# Patient Record
Sex: Male | Born: 1960 | Race: White | Hispanic: No | Smoking: Former smoker
Health system: Southern US, Community
[De-identification: ages and names within clinical notes are randomized; demographics above are authoritative.]

## PROBLEM LIST (undated history)

## (undated) DIAGNOSIS — K219 Gastro-esophageal reflux disease without esophagitis: Secondary | ICD-10-CM

## (undated) DIAGNOSIS — K766 Portal hypertension: Secondary | ICD-10-CM

## (undated) DIAGNOSIS — I8500 Esophageal varices without bleeding: Secondary | ICD-10-CM

## (undated) DIAGNOSIS — T7840XA Allergy, unspecified, initial encounter: Secondary | ICD-10-CM

## (undated) DIAGNOSIS — G5600 Carpal tunnel syndrome, unspecified upper limb: Secondary | ICD-10-CM

## (undated) DIAGNOSIS — E785 Hyperlipidemia, unspecified: Secondary | ICD-10-CM

## (undated) DIAGNOSIS — I10 Essential (primary) hypertension: Secondary | ICD-10-CM

## (undated) DIAGNOSIS — K922 Gastrointestinal hemorrhage, unspecified: Secondary | ICD-10-CM

## (undated) DIAGNOSIS — E119 Type 2 diabetes mellitus without complications: Secondary | ICD-10-CM

## (undated) DIAGNOSIS — M1990 Unspecified osteoarthritis, unspecified site: Secondary | ICD-10-CM

## (undated) DIAGNOSIS — K3189 Other diseases of stomach and duodenum: Secondary | ICD-10-CM

## (undated) DIAGNOSIS — R7303 Prediabetes: Secondary | ICD-10-CM

## (undated) HISTORY — DX: Gastro-esophageal reflux disease without esophagitis: K21.9

## (undated) HISTORY — DX: Carpal tunnel syndrome, unspecified upper limb: G56.00

## (undated) HISTORY — DX: Essential (primary) hypertension: I10

## (undated) HISTORY — DX: Unspecified osteoarthritis, unspecified site: M19.90

## (undated) HISTORY — PX: SPINE SURGERY: SHX786

## (undated) HISTORY — PX: OTHER SURGICAL HISTORY: SHX169

## (undated) HISTORY — DX: Hyperlipidemia, unspecified: E78.5

## (undated) HISTORY — DX: Other diseases of stomach and duodenum: K31.89

## (undated) HISTORY — DX: Allergy, unspecified, initial encounter: T78.40XA

## (undated) HISTORY — DX: Gastrointestinal hemorrhage, unspecified: K92.2

## (undated) HISTORY — PX: CARPAL TUNNEL RELEASE: SHX101

## (undated) HISTORY — DX: Other diseases of stomach and duodenum: K76.6

## (undated) HISTORY — DX: Esophageal varices without bleeding: I85.00

## (undated) HISTORY — PX: CERVICAL FUSION: SHX112

## (undated) SURGERY — ESOPHAGOGASTRODUODENOSCOPY (EGD) WITH PROPOFOL
Anesthesia: Monitor Anesthesia Care

---

## 2006-03-25 ENCOUNTER — Emergency Department (HOSPITAL_COMMUNITY): Admission: EM | Admit: 2006-03-25 | Discharge: 2006-03-25 | Payer: Self-pay | Admitting: Family Medicine

## 2006-10-06 ENCOUNTER — Inpatient Hospital Stay (HOSPITAL_COMMUNITY): Admission: EM | Admit: 2006-10-06 | Discharge: 2006-10-06 | Payer: Self-pay | Admitting: Emergency Medicine

## 2006-10-06 ENCOUNTER — Encounter (INDEPENDENT_AMBULATORY_CARE_PROVIDER_SITE_OTHER): Payer: Self-pay | Admitting: Specialist

## 2009-02-19 ENCOUNTER — Encounter: Admission: RE | Admit: 2009-02-19 | Discharge: 2009-02-19 | Payer: Self-pay | Admitting: Internal Medicine

## 2010-12-16 NOTE — Op Note (Signed)
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  Operation was anal  ultrasound and fistulectomy x2.   SURGEON:  Dr. Consuello Bossier.   HISTORY:  Nathan Roberts is a 50 year old Caucasian male whom I first  saw yesterday with the following history.  He said for several months he  has had recurring boils and infection around his anus.  Sometimes he  will actually notice that he actually passes a little gas through these  little external openings.  It has been more painful than it was when I  saw him yesterday but on exam, he had 2-3 external fistulae openings,  all kind of the anterior to the anus.  Some of the openings were 5-6 cm  away from the actual anal verge.  I could feel a definitely thickened  fluctuant area that was not significant erythema and fluctuance like an  obvious perirectal abscess, and attempts to do any type anoscopic exam  and identify an internal fistula opening, I could not find anything that  was definitely an internal fistula opening.  I recommended that we do 2  things.  One, he needs to be examined under anesthesia with anal  ultrasound and excision of these, and I have discussed with him that  with 2 areas, the possibility of Crohn's disease.  He says he has a  little bowel irregularity but nothing that significant, and he has not  had any weight loss, and he does not have any significant family history  of inflammatory bowel disease.  We discussed the options of possibly  doing the surgery next Wednesday or today, and he elected to go ahead  and proceed today.  I had placed him on Augmentin yesterday.   This  morning, he had a bowel movement, and he came in.  His lab studies did  not show an elevated white count, and permission was obtained for the  procedure.  The patient was taken to the operative suite, general  anesthesia endotracheal tube, and we gave him 3 g of Unasyn with  starting the IV fluids.  The patient in the candy-cane stirrups first  used the anal ultrasound and did the examination.  I could not see an  actual abscess.  I then put hydrogen peroxide starting on the area on  the left first and with this, and you could actually see the peroxide  coming out at the dentate line area, and it looks like this is not a  high fistula but one that is actually going nearly superficial to the  internal and external sphincter.  On injection on the opposite side, I  could not see any actual peroxide actually coming forth at that time and  whether the 2 communicated I could not tell at  this point of the  procedure.  I then prepped him with Betadine solution, draped him, and  then used a probe that I directed, starting along the area on the left  first and kind of curves a little bit towards the midline and then kind  of goes to the sphincter area, and I kind of opened up the overlying  skin on top of the probe and then with the probe in this, I could inject  some more peroxide, identified the little internal fistula open, and  then could slip the probe through it.  I then basically did a  fistulotomy, but I went back and then excised this chronic tract that  was about the size of kind of a thickened straw.  It appears that it  does not go down deep to the internal sphincter but actually kind of  goes through just the more superficial sphincter fibers.  Good  hemostasis was obtained with cautery, and I could not identify frank  large perirectal abscess at this time.  I then directed my attention to  the opposite side.  On this side, there were actually 2 external  openings that  communicated, and I used the peroxide and then placed a  probe, divided on the area, and then with now we were down probably 3 cm  from the anal verge, reinjected a little peroxide and this time I could  see a little fistula opening and each of these so that the openings if  you are looking at the anus; one is about 10:30 and the other was about  1:30 so that they do not communicate but following Gouda's rule, kind of  going directly down to the internal and external sphincter complex area.  I then could slide the little probe through this and actually did first  a fistulotomy and then actually excised the little fistulous fibrous  tract and sent the specimens labeled as left and right fistulous tract.  Hemostasis was again controlled.  I did remove a little bit more skin to  connect it to external openings and then to kind of approximate the skin  edges and kind of hopefully heal and speed the healing.  I did use some  simple 3-0 nylon sutures to kind of approximate the skin edges, left the  area open for the oh inch or so closest and kind of going up into the  sphincter and then used 1/2 inch Iodoform gauze, kind to tripled back-  and-forth, kind of in the little cavity for predominantly hemostasis  today.  The surgery was terminated and with the patient awakened up, I  think he is actually going to still have good sphincter control as the  fistulas appeared to go superficial to the internal, external sphincter  and not really tipping up under the sphincter as is usually more common.  The patient will receive an additional dose of Unasyn and then will be  released later today to resume his Augmentin, and I will see him in the  office on Monday to remove the packing.  He will start soaking in the  tub tomorrow and hopefully will be able to return to work.  He works a  split week.  He will not be able to go Monday and Tuesday, but I would hope by the following time frame that he works, he  will be able to be  back to work.           ______________________________  Nathan Pancoast.  Nathan Roberts, M.D.     WJW/MEDQ  D:  10/06/2006  T:  10/06/2006  Job:  [id number]

## 2011-07-07 ENCOUNTER — Ambulatory Visit (INDEPENDENT_AMBULATORY_CARE_PROVIDER_SITE_OTHER): Payer: PRIVATE HEALTH INSURANCE

## 2011-07-07 DIAGNOSIS — Z0000 Encounter for general adult medical examination without abnormal findings: Secondary | ICD-10-CM

## 2011-07-07 DIAGNOSIS — H9319 Tinnitus, unspecified ear: Secondary | ICD-10-CM

## 2011-07-07 DIAGNOSIS — IMO0001 Reserved for inherently not codable concepts without codable children: Secondary | ICD-10-CM

## 2011-07-29 ENCOUNTER — Ambulatory Visit (INDEPENDENT_AMBULATORY_CARE_PROVIDER_SITE_OTHER): Payer: PRIVATE HEALTH INSURANCE

## 2011-07-29 DIAGNOSIS — M545 Low back pain, unspecified: Secondary | ICD-10-CM

## 2011-07-29 DIAGNOSIS — M62838 Other muscle spasm: Secondary | ICD-10-CM

## 2011-11-01 ENCOUNTER — Encounter: Payer: Self-pay | Admitting: Family Medicine

## 2011-11-01 ENCOUNTER — Ambulatory Visit (INDEPENDENT_AMBULATORY_CARE_PROVIDER_SITE_OTHER): Payer: Managed Care, Other (non HMO) | Admitting: Family Medicine

## 2011-11-01 VITALS — BP 150/100 | Temp 98.4°F | Ht 68.5 in | Wt 206.0 lb

## 2011-11-01 DIAGNOSIS — Z23 Encounter for immunization: Secondary | ICD-10-CM

## 2011-11-01 DIAGNOSIS — M5090 Cervical disc disorder, unspecified, unspecified cervical region: Secondary | ICD-10-CM

## 2011-11-01 DIAGNOSIS — Z0000 Encounter for general adult medical examination without abnormal findings: Secondary | ICD-10-CM

## 2011-11-01 DIAGNOSIS — E785 Hyperlipidemia, unspecified: Secondary | ICD-10-CM

## 2011-11-01 DIAGNOSIS — I10 Essential (primary) hypertension: Secondary | ICD-10-CM

## 2011-11-01 DIAGNOSIS — N529 Male erectile dysfunction, unspecified: Secondary | ICD-10-CM

## 2011-11-01 LAB — CBC WITH DIFFERENTIAL/PLATELET
Basophils Relative: 0.4 % (ref 0.0–3.0)
Eosinophils Absolute: 0.2 10*3/uL (ref 0.0–0.7)
Hemoglobin: 15.7 g/dL (ref 13.0–17.0)
Lymphs Abs: 1.5 10*3/uL (ref 0.7–4.0)
MCHC: 34.1 g/dL (ref 30.0–36.0)
MCV: 98.1 fl (ref 78.0–100.0)
Monocytes Absolute: 0.8 10*3/uL (ref 0.1–1.0)
Neutro Abs: 3.3 10*3/uL (ref 1.4–7.7)
RBC: 4.69 Mil/uL (ref 4.22–5.81)

## 2011-11-01 LAB — BASIC METABOLIC PANEL
CO2: 28 mEq/L (ref 19–32)
Calcium: 9.3 mg/dL (ref 8.4–10.5)
Creatinine, Ser: 0.8 mg/dL (ref 0.4–1.5)
Glucose, Bld: 103 mg/dL — ABNORMAL HIGH (ref 70–99)
Sodium: 141 mEq/L (ref 135–145)

## 2011-11-01 LAB — POCT URINALYSIS DIPSTICK
Spec Grav, UA: 1.02
Urobilinogen, UA: 2
pH, UA: 7

## 2011-11-01 LAB — LIPID PANEL
Cholesterol: 142 mg/dL (ref 0–200)
LDL Cholesterol: 68 mg/dL (ref 0–99)
Total CHOL/HDL Ratio: 3

## 2011-11-01 LAB — HEPATIC FUNCTION PANEL
Alkaline Phosphatase: 108 U/L (ref 39–117)
Bilirubin, Direct: 0.2 mg/dL (ref 0.0–0.3)

## 2011-11-01 MED ORDER — SILDENAFIL CITRATE 50 MG PO TABS: 50.0000 mg | ORAL_TABLET | ORAL | Status: DC | PRN

## 2011-11-01 MED ORDER — ATORVASTATIN CALCIUM 40 MG PO TABS: 40.0000 mg | ORAL_TABLET | Freq: Every day | ORAL | Status: DC

## 2011-11-01 MED ORDER — TRAMADOL HCL 50 MG PO TABS: 50.0000 mg | ORAL_TABLET | Freq: Three times a day (TID) | ORAL | Status: AC | PRN

## 2011-11-01 MED ORDER — AMLODIPINE BESYLATE 10 MG PO TABS: 10.0000 mg | ORAL_TABLET | Freq: Every day | ORAL | Status: DC

## 2011-11-01 NOTE — Progress Notes (Signed)
Subjective:    Patient ID: Nathan Roberts, male    DOB: Dec 09, 1960, 51 y.o.   MRN: [id number]  HPI Nathan Roberts is a 51 year old married male nonsmoker,,,,,,,,, Nathan Roberts of 17 years is a smoker in the house,,,,,,,,,, who comes in today for is a new patient for general physical examination because of a history of hypertension  He has been on a combination of Norvasc and atorvastatin 5-40 for a combination of hypertension and hyperlipidemia. BP today right arm sitting position 180/100 he states the lowest at home is 140/90  He had a cervical fusion C4-C5 in [place] many years ago and because of that he has chronic neck pain. 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 He does get a massage weekly. In the past he was given Vicodin. He's also had left carpal tunnel syndrome surgery  He's had allergic rhinitis  Again he is a nonsmoker but is exposed to secondhand smoke.  Review of systems pertinent and had Lasix eye surgery.... referred attendee this dry exam, regular dental care, never had a colonoscopy, is latex sensitive  Family history well outlined father had hypertension congestive heart failure obesity and atrial fibrillation mother died of lung cancer was a smoker 3 brothers 2 sisters all in good health tetanus booster unknown will give him a booster today.   Review of Systems  Constitutional: Negative.   HENT: Negative.   Eyes: Negative.   Respiratory: Negative.   Cardiovascular: Negative.   Gastrointestinal: Negative.   Genitourinary: Negative.   Musculoskeletal: Positive for arthralgias.  Skin: Negative.   Neurological: Negative.   Hematological: Negative.   Psychiatric/Behavioral: Negative.        Objective:   Physical Exam  Constitutional: He is oriented to person, place, and time. He appears well-developed and well-nourished.  HENT:  Head: Normocephalic and atraumatic.  Right Ear: External ear normal.  Left Ear: External ear normal.    Nose: Nose normal.  Mouth/Throat: Oropharynx is clear and moist.  Eyes: Conjunctivae and EOM are normal. Pupils are equal, round, and reactive to light.  Neck: Normal range of motion. Neck supple. No JVD present. No tracheal deviation present. No thyromegaly present.  Cardiovascular: Normal rate, regular rhythm, normal heart sounds and intact distal pulses.  Exam reveals no gallop and no friction rub.   No murmur heard. Pulmonary/Chest: Effort normal and breath sounds normal. No stridor. No respiratory distress. He has no wheezes. He has no rales. He exhibits no tenderness.  Abdominal: Soft. Bowel sounds are normal. He exhibits no distension and no mass. There is no tenderness. There is no rebound and no guarding.  Genitourinary: Rectum normal, prostate normal and penis normal. Guaiac negative stool. No penile tenderness.  Musculoskeletal: Normal range of motion. He exhibits no edema and no tenderness.  Lymphadenopathy:    He has no cervical adenopathy.  Neurological: He is alert and oriented to person, place, and time. He has normal reflexes. No cranial nerve deficit. He exhibits normal muscle tone.  Skin: Skin is warm and dry. No rash noted. No erythema. No pallor.  Psychiatric: He has a normal mood and affect. Nathan behavior is normal. Judgment and thought content normal.          Assessment & Plan:  Healthy male  Hypertension not at goal increase Norvasc to 10 mg daily BP check daily followup in 2 weeks  History of cervical disc surgery now with chronic neck and back pain Motrin 400 twice a  day tramadol one half to one tablet at bedtime neurosurgical consult if symptoms persist  Exposed to secondhand smoke for 20 years advised to have Nathan Roberts not smoke in the house and they'll be happy to see her and put her on a smoking cessation program  Allergic rhinitis continue OTC Claritin  Needs screening colonoscopy we'll set that up

## 2011-11-01 NOTE — Patient Instructions (Signed)
Motrin 400 mg twice daily with food  Tramadol one half to one tablet at bedtime as needed for pain  If the above does not control your symptoms then we will get you set up for a evaluation by a neurosurgeon  Viagra 50 mg,,,,,,,,,,,, one half tab 1-2 hours prior to sex  Norvasc 10 mg daily for high blood pressure  Purchase an  upper arm digital pump oup  blood pressure cuff,,,,,,,,,,,,,,,,,omron,,,,,,,,,,,, check your blood pressure daily in the morning return in 2 weeks for followup  Complete no salt diet  Lipitor 40 mg,,,,,,,,,,,,,,one tablet at bedtime for hyperlipidemia  When you return in 2 weeks bring a record of all your blood pressure readings and the device  Because of your hypertension I would recommend you stay in a complete a smoke free environment,,,,,,, asked u  wife not to smoke in the house anymore

## 2011-12-12 ENCOUNTER — Encounter: Payer: Self-pay | Admitting: Gastroenterology

## 2011-12-15 ENCOUNTER — Telehealth: Payer: Self-pay | Admitting: Family Medicine

## 2011-12-15 DIAGNOSIS — M5090 Cervical disc disorder, unspecified, unspecified cervical region: Secondary | ICD-10-CM

## 2011-12-15 NOTE — Telephone Encounter (Signed)
Referral request sent 

## 2011-12-15 NOTE — Telephone Encounter (Signed)
Pt states he was seen a few weeks ago and was instructed by Dr. Tawanna Cooler to call back if he was not any better.  Pt states he is not better and would like to be referred to a neurologist per Dr. Nelida Meuse advice.

## 2011-12-20 ENCOUNTER — Other Ambulatory Visit: Payer: Self-pay | Admitting: Specialist

## 2011-12-20 DIAGNOSIS — R51 Headache: Secondary | ICD-10-CM

## 2011-12-22 ENCOUNTER — Ambulatory Visit (AMBULATORY_SURGERY_CENTER): Payer: Managed Care, Other (non HMO) | Admitting: *Deleted

## 2011-12-22 VITALS — Ht 68.5 in | Wt 206.2 lb

## 2011-12-22 DIAGNOSIS — Z1211 Encounter for screening for malignant neoplasm of colon: Secondary | ICD-10-CM

## 2011-12-22 MED ORDER — PEG-KCL-NACL-NASULF-NA ASC-C 100 G PO SOLR: ORAL | Status: DC

## 2011-12-27 ENCOUNTER — Ambulatory Visit
Admission: RE | Admit: 2011-12-27 | Discharge: 2011-12-27 | Disposition: A | Discharge status: Home / Self Care | Payer: Managed Care, Other (non HMO) | Source: Ambulatory Visit | Attending: Specialist | Admitting: Specialist

## 2011-12-27 DIAGNOSIS — R51 Headache: Secondary | ICD-10-CM

## 2012-01-01 ENCOUNTER — Ambulatory Visit (AMBULATORY_SURGERY_CENTER): Payer: Managed Care, Other (non HMO) | Admitting: Gastroenterology

## 2012-01-01 ENCOUNTER — Encounter: Payer: Self-pay | Admitting: Gastroenterology

## 2012-01-01 VITALS — BP 165/105 | HR 85 | Temp 96.5°F | Resp 20 | Ht 68.0 in | Wt 206.0 lb

## 2012-01-01 DIAGNOSIS — Z1211 Encounter for screening for malignant neoplasm of colon: Secondary | ICD-10-CM

## 2012-01-01 DIAGNOSIS — K5730 Diverticulosis of large intestine without perforation or abscess without bleeding: Secondary | ICD-10-CM

## 2012-01-01 LAB — HM COLONOSCOPY

## 2012-01-01 MED ORDER — SODIUM CHLORIDE 0.9 % IV SOLN: 500.0000 mL | INTRAVENOUS | Status: DC

## 2012-01-01 NOTE — Progress Notes (Signed)
Patient did not experience any of the following events: a burn prior to discharge; a fall within the facility; wrong site/side/patient/procedure/implant event; or a hospital transfer or hospital admission upon discharge from the facility. (G8907) Patient did not have preoperative order for IV antibiotic SSI prophylaxis. (G8918)  

## 2012-01-01 NOTE — Op Note (Signed)
[facility] [address] [place], [place]  [address]  COLONOSCOPY PROCEDURE REPORT  PATIENT:  Nathan Roberts, Nathan Roberts  MR#:  [id number] BIRTHDATE:  December 30, 1960, 51 yrs. old  GENDER:  male ENDOSCOPIST:  Vania Rea. Jarold Motto, MD, [facility] REF. BY:  Tinnie Gens A. Tawanna Cooler, M.D. PROCEDURE DATE:  01/01/2012 PROCEDURE:  Average-risk screening colonoscopy G0121 ASA CLASS:  Class II INDICATIONS:  Routine Risk Screening MEDICATIONS:   propofol (Diprivan) 350 mg IV  DESCRIPTION OF PROCEDURE:   After the risks and benefits and of the procedure were explained, informed consent was obtained. Digital rectal exam was performed and revealed no abnormalities. The LB [id number] endoscope was introduced through the anus and advanced to the cecum, which was identified by both the appendix and ileocecal valve.  The quality of the prep was excellent, using MoviPrep.  The instrument was then slowly withdrawn as the colon was fully examined. <<PROCEDUREIMAGES>>  FINDINGS:  No polyps or cancers were seen.  Moderate diverticulosis was found in the sigmoid to descending colon segments.  This was otherwise a normal examination of the colon. Retroflexed views in the rectum revealed no abnormalities.    The scope was then withdrawn from the patient and the procedure completed.  COMPLICATIONS:  None ENDOSCOPIC IMPRESSION: 1) No polyps or cancers 2) Moderate diverticulosis in the sigmoid to descending colon segments 3) Otherwise normal examination RECOMMENDATIONS: 1) Continue current medications 2) High fiber diet. 3) Continue current colorectal screening recommendations for "routine risk" patients with a repeat colonoscopy in 10 years.  REPEAT EXAM:  No  ______________________________ Vania Rea. Jarold Motto, MD, [facility]  CC:  n. eSIGNED:   Vania Rea. Kloe Oates at 01/01/2012 02:41 PM  Sherry Ruffing, [id number]

## 2012-01-01 NOTE — Progress Notes (Signed)
Propofol per [facility]. See scanned intra procedure report. ewm 

## 2012-01-01 NOTE — Patient Instructions (Signed)
YOU HAD AN ENDOSCOPIC PROCEDURE TODAY AT THE [facility]: Refer to the procedure report that was given to you for any specific questions about what was found during the examination.  If the procedure report does not answer your questions, please call your gastroenterologist to clarify.  If you requested that your care partner not be given the details of your procedure findings, then the procedure report has been included in a sealed envelope for you to review at your convenience later.  YOU SHOULD EXPECT: Some feelings of bloating in the abdomen. Passage of more gas than usual.  Walking can help get rid of the air that was put into your GI tract during the procedure and reduce the bloating. If you had a lower endoscopy (such as a colonoscopy or flexible sigmoidoscopy) you may notice spotting of blood in your stool or on the toilet paper. If you underwent a bowel prep for your procedure, then you may not have a normal bowel movement for a few days.  DIET: Your first meal following the procedure should be a light meal and then it is ok to progress to your normal diet.  A half-sandwich or bowl of soup is an example of a good first meal.  Heavy or fried foods are harder to digest and may make you feel nauseous or bloated.  Likewise meals heavy in dairy and vegetables can cause extra gas to form and this can also increase the bloating.  Drink plenty of fluids but you should avoid alcoholic beverages for 24 hours.  ACTIVITY: Your care partner should take you home directly after the procedure.  You should plan to take it easy, moving slowly for the rest of the day.  You can resume normal activity the day after the procedure however you should NOT DRIVE or use heavy machinery for 24 hours (because of the sedation medicines used during the test).    SYMPTOMS TO REPORT IMMEDIATELY: A gastroenterologist can be reached at any hour.  During normal business hours, 8:30 AM to 5:00 PM Monday through Friday,  call [phone].  After hours and on weekends, please call the GI answering service at [phone] who will take a message and have the physician on call contact you.   Following lower endoscopy (colonoscopy or flexible sigmoidoscopy):  Excessive amounts of blood in the stool  Significant tenderness or worsening of abdominal pains  Swelling of the abdomen that is new, acute  Fever of 100F or higher   FOLLOW UP: If any biopsies were taken you will be contacted by phone or by letter within the next 1-3 weeks.  Call your gastroenterologist if you have not heard about the biopsies in 3 weeks.  Our staff will call the home number listed on your records the next business day following your procedure to check on you and address any questions or concerns that you may have at that time regarding the information given to you following your procedure. This is a courtesy call and so if there is no answer at the home number and we have not heard from you through the emergency physician on call, we will assume that you have returned to your regular daily activities without incident.  SIGNATURES/CONFIDENTIALITY: You and/or your care partner have signed paperwork which will be entered into your electronic medical record.  These signatures attest to the fact that that the information above on your After Visit Summary has been reviewed and is understood.  Full responsibility of the confidentiality of   this discharge information lies with you and/or your care-partner.    INFORMATION ON DIVERTICULOSIS & HIGH FIBER DIET GIVEN TO YOU TODAY 

## 2012-01-02 ENCOUNTER — Telehealth: Payer: Self-pay | Admitting: *Deleted

## 2012-01-02 NOTE — Telephone Encounter (Signed)
  Follow up Call-  Call back number 01/01/2012  Post procedure Call Back phone  # [phone]  Permission to leave phone message Yes     Patient questions:  Do you have a fever, pain , or abdominal swelling? no Pain Score  0 *  Have you tolerated food without any problems? yes  Have you been able to return to your normal activities? yes  Do you have any questions about your discharge instructions: Diet   no Medications  no Follow up visit  no  Do you have questions or concerns about your Care? no  Actions: * If pain score is 4 or above: No action needed, pain <4.

## 2012-01-11 LAB — HM COLONOSCOPY

## 2012-02-19 ENCOUNTER — Telehealth: Payer: Self-pay | Admitting: Family Medicine

## 2012-02-19 NOTE — Telephone Encounter (Signed)
Head MRI reviewed. The study did suggest very mild sinus disease but this may not be clinically important. Would not treat for sinus disease unless patient demonstrates significant symptoms of acute sinusitis

## 2012-02-19 NOTE — Telephone Encounter (Signed)
Patient is aware and an appointment made 

## 2012-02-19 NOTE — Telephone Encounter (Signed)
Patient called stating that he had an MRI in May that showed that he had an acute sinusitis and he never did anything about it and wants to know if the MD can call him something in or does he need to come in. Please advise.

## 2012-02-21 ENCOUNTER — Ambulatory Visit: Payer: Managed Care, Other (non HMO) | Admitting: Family Medicine

## 2012-02-23 ENCOUNTER — Encounter: Payer: Self-pay | Admitting: Internal Medicine

## 2012-02-23 ENCOUNTER — Ambulatory Visit (INDEPENDENT_AMBULATORY_CARE_PROVIDER_SITE_OTHER): Payer: Managed Care, Other (non HMO) | Admitting: Internal Medicine

## 2012-02-23 VITALS — BP 142/96 | HR 77 | Temp 98.3°F | Wt 200.0 lb

## 2012-02-23 DIAGNOSIS — G4700 Insomnia, unspecified: Secondary | ICD-10-CM

## 2012-02-23 DIAGNOSIS — Z634 Disappearance and death of family member: Secondary | ICD-10-CM

## 2012-02-23 DIAGNOSIS — J329 Chronic sinusitis, unspecified: Secondary | ICD-10-CM | POA: Insufficient documentation

## 2012-02-23 DIAGNOSIS — R51 Headache: Secondary | ICD-10-CM

## 2012-02-23 DIAGNOSIS — R519 Headache, unspecified: Secondary | ICD-10-CM

## 2012-02-23 DIAGNOSIS — I10 Essential (primary) hypertension: Secondary | ICD-10-CM

## 2012-02-23 MED ORDER — MOMETASONE FUROATE 50 MCG/ACT NA SUSP: NASAL | Status: DC

## 2012-02-23 MED ORDER — ESZOPICLONE 3 MG PO TABS: ORAL_TABLET | ORAL | Status: DC

## 2012-02-23 NOTE — Patient Instructions (Addendum)
Chronic  sinusitis  Is usually treated with long term  Nasal cortisone  . If the flonase makes you sneeze we can   Change to nasonex  rx written with coupon. It might help the snoring   Sleep disturbance  can be from grief but need to take caer with sleep aids as they can be habit forming and cause   Mental fogginess.  Can try either sample of lunesta   At night for a few nights or even 300 mg of the neuronitis at night ( 3 of the 100 mg )  .  Do not take with alcohol or  Tramadol type medications  There is a type of counseling tpo help with sleep . But advise you discuss this with Dr Felipa Eth with Dr Tawanna Cooler  About the HAs sleep and sinusitis sx .   Insomnia Insomnia is frequent trouble falling and/or staying asleep. Insomnia can be a long term problem or a short term problem. Both are common. Insomnia can be a short term problem when the wakefulness is related to a certain stress or worry. Long term insomnia is often related to ongoing stress during waking hours and/or poor sleeping habits. Overtime, sleep deprivation itself can make the problem worse. Every little thing feels more severe because you are overtired and your ability to cope is decreased. CAUSES   Stress, anxiety, and depression.   Poor sleeping habits.   Distractions such as TV in the bedroom.   Naps close to bedtime.   Engaging in emotionally charged conversations before bed.   Technical reading before sleep.   Alcohol and other sedatives. They may make the problem worse. They can hurt normal sleep patterns and normal dream activity.   Stimulants such as caffeine for several hours prior to bedtime.   Pain syndromes and shortness of breath can cause insomnia.   Exercise late at night.   Changing time zones may cause sleeping problems (jet lag).  It is sometimes helpful to have someone observe your sleeping patterns. They should look for periods of not breathing during the night (sleep apnea). They should also look to  see how long those periods last. If you live alone or observers are uncertain, you can also be observed at a sleep clinic where your sleep patterns will be professionally monitored. Sleep apnea requires a checkup and treatment. Give your caregivers your medical history. Give your caregivers observations your family has made about your sleep.  SYMPTOMS   Not feeling rested in the morning.   Anxiety and restlessness at bedtime.   Difficulty falling and staying asleep.  TREATMENT   Your caregiver may prescribe treatment for an underlying medical disorders. Your caregiver can give advice or help if you are using alcohol or other drugs for self-medication. Treatment of underlying problems will usually eliminate insomnia problems.   Medications can be prescribed for short time use. They are generally not recommended for lengthy use.   Over-the-counter sleep medicines are not recommended for lengthy use. They can be habit forming.   You can promote easier sleeping by making lifestyle changes such as:   Using relaxation techniques that help with breathing and reduce muscle tension.   Exercising earlier in the day.   Changing your diet and the time of your last meal. No night time snacks.   Establish a regular time to go to bed.   Counseling can help with stressful problems and worry.   Soothing music and white noise may be helpful if there  are background noises you cannot remove.   Stop tedious detailed work at least one hour before bedtime.  HOME CARE INSTRUCTIONS   Keep a diary. Inform your caregiver about your progress. This includes any medication side effects. See your caregiver regularly. Take note of:   Times when you are asleep.   Times when you are awake during the night.   The quality of your sleep.   How you feel the next day.  This information will help your caregiver care for you.  Get out of bed if you are still awake after 15 minutes. Read or do some quiet activity.  Keep the lights down. Wait until you feel sleepy and go back to bed.   Keep regular sleeping and waking hours. Avoid naps.   Exercise regularly.   Avoid distractions at bedtime. Distractions include watching television or engaging in any intense or detailed activity like attempting to balance the household checkbook.   Develop a bedtime ritual. Keep a familiar routine of bathing, brushing your teeth, climbing into bed at the same time each night, listening to soothing music. Routines increase the success of falling to sleep faster.   Use relaxation techniques. This can be using breathing and muscle tension release routines. It can also include visualizing peaceful scenes. You can also help control troubling or intruding thoughts by keeping your mind occupied with boring or repetitive thoughts like the old concept of counting sheep. You can make it more creative like imagining planting one beautiful flower after another in your backyard garden.   During your day, work to eliminate stress. When this is not possible use some of the previous suggestions to help reduce the anxiety that accompanies stressful situations.  MAKE SURE YOU:   Understand these instructions.   Will watch your condition.   Will get help right away if you are not doing well or get worse.  Document Released: 07/14/2000 Document Revised: 07/06/2011 Document Reviewed: 08/14/2007  [facility] Patient Information 2012 [place], [place].

## 2012-02-23 NOTE — Progress Notes (Signed)
  Subjective:    Patient ID: Nathan Roberts, male    DOB: 12/06/1960, 51 y.o.   MRN: [id number]  HPI Patient comes in today for SDA for  new problem evaluation. He is a problem with the sinuses however he has a couple of other issues has had nasal congestion and headaches Has since  April and mri in May . This showed severe A. mild chronic sinusitis. He apparently was put on nasal cortisone that made him sneeze the bed and is out of that it may have been a sample. Never came back and though would go away and lots of s of pressure in face.  Am  Ok then blow nose a lot clear and then as day goes on gets cneter head ache and then nose pressure likg wanting to sneeze  But cant.  Some type snoring. No obstructive symptoms Nose stuffy about 75 % of time.  ? Worse   Wife told hi mto come in cause not sleeping a lot. Since the death of his father in [place]. He is also in the past been under the care of the headache clinic and they've given him medications including Neurontin 100 mg as needed and he also had shots in his neck which may have been helpful but they were so expensive that he hasn't gone back. Dr. Tawanna Cooler had given him tramadol to use as needed at night for the headaches. He states he didn't work out well he's been not taking the medicine for least 3 weeks.  2 cups am   Coffee;  etoh on  Weekends. Nose smoking no unusual inhalants Review of Systems Negative chest pain shortness of breath cough change in vision or hearing.  Denies specific depression and hopelessness but does feel sad and thinks if he got a good night sleep he would feel better  Past history family history social history reviewed in the electronic medical record.     Objective:   Physical Exam BP 142/96  Pulse 77  Temp 98.3 F (36.8 C) (Oral)  Wt 200 lb (90.719 kg)  SpO2 99% Well-developed well-nourished looks tired in no acute distress a bit down HEENT normocephalic TMs clear eyes PERRLA EOMs are full nares  congested no discharge noted face nontender OP mild redness cobblestoning no discharge noted neck without masses or thyromegaly chest clear to auscultation cardiac S1-S2 no gallops or murmurs Neurologic is grossly intact Oriented x 3 and no noted deficits in memory, attention, and speech. Looks slightly down.  MRI reviewed copy given to patient     Assessment & Plan:    Sleep issue  prob related to fathers death . Would avoid habit or dependent producing medicines on a regular basis consider Neurontin 300 mg at night or even Elavil at night but this should be managed by his primary care physician. Also discussed good debridement counseling services at hospice Under prev  care for heachache  uncertain what is been helpful. Financial considerations.  Snoring nasal congestion possible chronic sinusitis not severe get back on nasal cortisone no symptoms of sleep apnea but is tired. His headache seemed to not be a.m. headaches like sleep apnea he does have a low palate but denies any obstructive symptoms at night. That have been observed.  Would have caution with multiple medications that cause fogginess.  Total visit > 50% spent counseling and coordinating care

## 2012-07-22 ENCOUNTER — Telehealth: Payer: Self-pay | Admitting: Family Medicine

## 2012-07-22 NOTE — Telephone Encounter (Signed)
Patient called stating that he would like to switch MDs from Dr. Tawanna Cooler to Dr. Selena Batten as he need an MD with more availability. Please advise.

## 2012-07-23 NOTE — Telephone Encounter (Signed)
Ok with me if ok with current PCP...but I feel like Dr. Tawanna Cooler is usually pretty good about getting his patients in.

## 2012-08-06 ENCOUNTER — Encounter: Payer: Self-pay | Admitting: Family Medicine

## 2012-08-06 ENCOUNTER — Ambulatory Visit (INDEPENDENT_AMBULATORY_CARE_PROVIDER_SITE_OTHER): Payer: Managed Care, Other (non HMO) | Admitting: Family Medicine

## 2012-08-06 VITALS — BP 150/90 | Temp 98.6°F | Wt 204.0 lb

## 2012-08-06 DIAGNOSIS — G4700 Insomnia, unspecified: Secondary | ICD-10-CM

## 2012-08-06 DIAGNOSIS — I10 Essential (primary) hypertension: Secondary | ICD-10-CM

## 2012-08-06 MED ORDER — LOSARTAN POTASSIUM 25 MG PO TABS: 25.0000 mg | ORAL_TABLET | Freq: Every day | ORAL | Status: DC

## 2012-08-06 NOTE — Telephone Encounter (Signed)
Dr. Selena Batten declines

## 2012-08-06 NOTE — Progress Notes (Signed)
  Subjective:    Patient ID: Nathan Roberts, male    DOB: 17-Aug-1960, 52 y.o.   MRN: [id number]  HPI Glennis is a 52 year old male nonsmoker who comes in today for evaluation of 2 problems  We saw him a while back and started him on Norvasc 10 mg daily and asked him to come back in 2 weeks for followup. She did not come back for followup. He's now here today. He states his blood pressure at home is 140/90. Here today is 150/90. He states he is taking the Norvasc 10 mg daily. We talked about the number of medications options. We will add Cozaar 25 mg daily to the Norvasc.  He seen in the headache clinic and is given a number of medications by Dr. Neale Burly for migraine headaches  He states he has trouble with insomnia and would like sleeping pills. I explained to him that I do not use sleeping pills however I gave him the name of. Emerson Monte general review of systems otherwise negative   Review of Systems General review of systems otherwise negative    Objective:   Physical Exam  Well-developed well-nourished male in no acute distress BP right arm sitting position 150/90      Assessment & Plan:  Hypertension not at goal continue Norvasc and Cozaar 25 mg daily. BP check every morning x4 weeks. Return for followup of blood pressure not at goal  Insomnia referred to Dr. Emerson Monte

## 2012-08-06 NOTE — Patient Instructions (Signed)
Continue the Norvasc 10 mg daily in the morning  Add the Cozaar 25 mg daily in the morning  Blood pressure checked daily in the morning  At the end of 4 weeks if your blood pressure is at goal,,,,,,,,,,, 135/85 or less,,,,,,,, than the above combination is the right combination for you. If however after one month your blood pressures not at goal return for followup  4 insomnia I would recommend Dr. Andee Poles

## 2012-08-12 ENCOUNTER — Other Ambulatory Visit: Payer: Self-pay | Admitting: *Deleted

## 2012-08-12 DIAGNOSIS — E785 Hyperlipidemia, unspecified: Secondary | ICD-10-CM

## 2012-08-12 DIAGNOSIS — I10 Essential (primary) hypertension: Secondary | ICD-10-CM

## 2012-08-12 MED ORDER — AMLODIPINE BESYLATE 10 MG PO TABS: 10.0000 mg | ORAL_TABLET | Freq: Every day | ORAL | Status: DC

## 2012-08-12 MED ORDER — ATORVASTATIN CALCIUM 40 MG PO TABS: 40.0000 mg | ORAL_TABLET | Freq: Every day | ORAL | Status: DC

## 2012-08-17 ENCOUNTER — Encounter (HOSPITAL_COMMUNITY): Payer: Self-pay | Admitting: Emergency Medicine

## 2012-08-17 ENCOUNTER — Emergency Department (HOSPITAL_COMMUNITY)
Admission: EM | Admit: 2012-08-17 | Discharge: 2012-08-17 | Disposition: A | Discharge status: Home / Self Care | Payer: Managed Care, Other (non HMO) | Attending: Emergency Medicine | Admitting: Emergency Medicine

## 2012-08-17 DIAGNOSIS — Z8719 Personal history of other diseases of the digestive system: Secondary | ICD-10-CM | POA: Insufficient documentation

## 2012-08-17 DIAGNOSIS — I10 Essential (primary) hypertension: Secondary | ICD-10-CM | POA: Insufficient documentation

## 2012-08-17 DIAGNOSIS — Z8669 Personal history of other diseases of the nervous system and sense organs: Secondary | ICD-10-CM | POA: Insufficient documentation

## 2012-08-17 DIAGNOSIS — Z87891 Personal history of nicotine dependence: Secondary | ICD-10-CM | POA: Insufficient documentation

## 2012-08-17 DIAGNOSIS — E785 Hyperlipidemia, unspecified: Secondary | ICD-10-CM | POA: Insufficient documentation

## 2012-08-17 DIAGNOSIS — Z9109 Other allergy status, other than to drugs and biological substances: Secondary | ICD-10-CM | POA: Insufficient documentation

## 2012-08-17 DIAGNOSIS — Z79899 Other long term (current) drug therapy: Secondary | ICD-10-CM | POA: Insufficient documentation

## 2012-08-17 DIAGNOSIS — R040 Epistaxis: Secondary | ICD-10-CM | POA: Insufficient documentation

## 2012-08-17 DIAGNOSIS — Z8739 Personal history of other diseases of the musculoskeletal system and connective tissue: Secondary | ICD-10-CM | POA: Insufficient documentation

## 2012-08-17 DIAGNOSIS — IMO0002 Reserved for concepts with insufficient information to code with codable children: Secondary | ICD-10-CM | POA: Insufficient documentation

## 2012-08-17 MED ORDER — OXYMETAZOLINE HCL 0.05 % NA SOLN
1.0000 | Freq: Once | NASAL | Status: AC
  Administered 2012-08-17: 1 via NASAL
  Filled 2012-08-17: qty 15

## 2012-08-17 NOTE — ED Provider Notes (Signed)
History  Scribed for Nathan Pel, PA-C/ Juliet Rude. Pickering, MD, the patient was seen in room WTR5/WTR5. This chart was scribed by Candelaria Stagers. The patient's care started at 5:38 PM   CSN: [id number]  Arrival date & time 08/17/12  1642   First MD Initiated Contact with Patient 08/17/12 1716      Chief Complaint  Patient presents with  . Epistaxis     The history is provided by the patient. No language interpreter was used.   Jermane Brayboy is a 52 y.o. male who presents to the Emergency Department complaining of epistaxis of the left nostril that started about one hour ago.  Pt reports for the last week he has seen blood after blowing his nose in the mornings.  He has no other sx. He has been congested lately but has no other s ymptoms. Denies being on blood thinners.  Pt has gauze in place which seemed to stop the bleeding.     Past Medical History  Diagnosis Date  . Allergy   . GERD (gastroesophageal reflux disease)   . Carpal tunnel syndrome   . Hypertension   . Arthritis   . Hyperlipidemia     Past Surgical History  Procedure Date  . Carpal tunnel release     left  . Cervical fusion   . Abscess     rectal area    Family History  Problem Relation Age of Onset  . Cancer Mother     lung  . Atrial fibrillation Father   . Heart disease Father   . Colon cancer Neg Hx   . Esophageal cancer Neg Hx   . Stomach cancer Neg Hx   . Rectal cancer Neg Hx     History  Substance Use Topics  . Smoking status: Former Games developer  . Smokeless tobacco: Never Used  . Alcohol Use: 12.0 oz/week    20 Cans of beer per week      Review of Systems  Constitutional: Negative for fever and chills.       Per HPI, otherwise negative  HENT:       Epistaxis from left nostril.   Eyes: Negative.   Respiratory: Negative for shortness of breath.        Per HPI, otherwise negative  Cardiovascular:       Per HPI, otherwise negative  Gastrointestinal: Negative for nausea and  vomiting.  Genitourinary: Negative.   Musculoskeletal:       Per HPI, otherwise negative  Skin: Negative.   Neurological: Negative for weakness.  All other systems reviewed and are negative.    Allergies  Latex  Home Medications   Current Outpatient Rx  Name  Route  Sig  Dispense  Refill  . AMLODIPINE BESYLATE 10 MG PO TABS   Oral   Take 1 tablet (10 mg total) by mouth daily.   90 tablet   3   . ATORVASTATIN CALCIUM 40 MG PO TABS   Oral   Take 1 tablet (40 mg total) by mouth daily.   90 tablet   3   . GABAPENTIN 100 MG PO CAPS   Oral   Take 100 mg by mouth 3 (three) times daily as needed. For headaches         . LOSARTAN POTASSIUM 25 MG PO TABS   Oral   Take 1 tablet (25 mg total) by mouth daily.   100 tablet   3   . ADULT MULTIVITAMIN W/MINERALS CH   Oral  Take 1 tablet by mouth daily.         Marland Kitchen SILDENAFIL CITRATE 50 MG PO TABS   Oral   Take 50 mg by mouth as needed.         Marland Kitchen TRAMADOL HCL 50 MG PO TABS   Oral   Take 50 mg by mouth every 6 (six) hours as needed. For back pain         . MOMETASONE FUROATE 50 MCG/ACT NA SUSP      2 spray each nostril qd   17 g   3     BP 153/98  Pulse 79  Temp 98.5 F (36.9 C)  Resp 16  Ht 5\' 8"  (1.727 m)  Wt 195 lb (88.451 kg)  BMI 29.65 kg/m2  SpO2 98%  Physical Exam  Nursing note and vitals reviewed. Constitutional: He is oriented to person, place, and time. He appears well-developed and well-nourished. No distress.  HENT:  Head: Normocephalic and atraumatic.       Pt had small break in skin of left nares.  Pt has no blood running down back of throat.  No blood in right nares.  Nose non tender.  No active bleeding.   Eyes: EOM are normal.  Neck: Neck supple. No tracheal deviation present.  Cardiovascular: Normal rate.   Pulmonary/Chest: Effort normal. No respiratory distress.  Musculoskeletal: Normal range of motion.  Neurological: He is alert and oriented to person, place, and time.  Skin:  Skin is warm and dry.  Psychiatric: He has a normal mood and affect. His behavior is normal.    ED Course  Procedures   DIAGNOSTIC STUDIES: Oxygen Saturation is 98% on room air, normal by my interpretation.    COORDINATION OF CARE:  5:39 PM Will prescribe afrin and saline spray.  Discussed treatment for epistaxis and sx that would bring back to ED.  Advised follow up with ENT if sx persists.  Pt understands and agrees.   5:45PM Ordered: oxymetazoline (AFRIN) 0.05% nasal spray 1 spray  Labs Reviewed - No data to display No results found.   1. Epistaxis       MDM  Pt has been advised of the symptoms that warrant their return to the ED. Patient has voiced understanding and has agreed to follow-up with the PCP or specialist.  I personally performed the services described in this documentation, which was scribed in my presence. The recorded information has been reviewed and is accurate.         Dorthula Matas, PA 08/17/12 2215

## 2012-08-17 NOTE — ED Provider Notes (Signed)
Medical screening examination/treatment/procedure(s) were performed by non-physician practitioner and as supervising physician I was immediately available for consultation/collaboration.  Braedon Sjogren R. Monnica Saltsman, MD 08/17/12 2314 

## 2012-08-17 NOTE — ED Notes (Signed)
Reports for the last 4 days noted when awaken & blows nose noted blood. Self resolved. The 60 minutes ago left nostril blood just poured out placed tissue into nose.

## 2012-09-02 ENCOUNTER — Ambulatory Visit (INDEPENDENT_AMBULATORY_CARE_PROVIDER_SITE_OTHER): Payer: Managed Care, Other (non HMO) | Admitting: Emergency Medicine

## 2012-09-02 VITALS — BP 167/93 | HR 71 | Temp 98.3°F | Resp 16 | Ht 69.5 in | Wt 200.0 lb

## 2012-09-02 DIAGNOSIS — S8010XA Contusion of unspecified lower leg, initial encounter: Secondary | ICD-10-CM

## 2012-09-02 MED ORDER — NAPROXEN SODIUM 550 MG PO TABS: 550.0000 mg | ORAL_TABLET | Freq: Two times a day (BID) | ORAL | Status: AC

## 2012-09-02 NOTE — Progress Notes (Signed)
[facility] [address], [place] [address] [phone]  Date:  09/02/2012   Name:  Nathan Roberts   DOB:  [birth year]   MRN:  [id number]  PCP:  Evette Georges, MD    Chief Complaint: Leg Swelling   History of Present Illness:  Nathan Roberts is a 52 y.o. very pleasant male patient who presents with the following:  Slipped on ice at Christmas and injured his right leg when it hit the underside of the dashboard.  Still swollen and tender with some minimal bruising.  Concerned that he may have a blood clot in his leg.  No other complaints.  Patient Active Problem List  Diagnosis  . Cervical disc disease  . Hypertension  . Bereavement  . Chronic sinusitis  . Persistent headaches  . Insomnia    Past Medical History  Diagnosis Date  . Allergy   . GERD (gastroesophageal reflux disease)   . Carpal tunnel syndrome   . Hypertension   . Arthritis   . Hyperlipidemia     Past Surgical History  Procedure Date  . Carpal tunnel release     left  . Cervical fusion   . Abscess     rectal area    History  Substance Use Topics  . Smoking status: Former Smoker    Types: Cigarettes  . Smokeless tobacco: Never Used  . Alcohol Use: 12.0 oz/week    20 Cans of beer per week    Family History  Problem Relation Age of Onset  . Cancer Mother     lung  . Atrial fibrillation Father   . Heart disease Father   . Colon cancer Neg Hx   . Esophageal cancer Neg Hx   . Stomach cancer Neg Hx   . Rectal cancer Neg Hx     Allergies  Allergen Reactions  . Latex Hives    Medication list has been reviewed and updated.  Current Outpatient Prescriptions on File Prior to Visit  Medication Sig Dispense Refill  . amLODipine (NORVASC) 10 MG tablet Take 1 tablet (10 mg total) by mouth daily.  90 tablet  3  . atorvastatin (LIPITOR) 40 MG tablet Take 1 tablet (40 mg total) by mouth daily.  90 tablet  3  . losartan (COZAAR) 25 MG tablet Take 1 tablet (25 mg  total) by mouth daily.  100 tablet  3  . Multiple Vitamin (MULTIVITAMIN WITH MINERALS) TABS Take 1 tablet by mouth daily.      . sildenafil (VIAGRA) 50 MG tablet Take 50 mg by mouth as needed.      . traMADol (ULTRAM) 50 MG tablet Take 50 mg by mouth every 6 (six) hours as needed. For back pain      . gabapentin (NEURONTIN) 100 MG capsule Take 100 mg by mouth 3 (three) times daily as needed. For headaches      . mometasone (NASONEX) 50 MCG/ACT nasal spray 2 spray each nostril qd  17 g  3    Review of Systems:  As per HPI, otherwise negative.    Physical Examination: Filed Vitals:   09/02/12 1506  BP: 167/93  Pulse: 71  Temp: 98.3 F (36.8 C)  Resp: 16   Filed Vitals:   09/02/12 1506  Height: 5' 9.5" (1.765 m)  Weight: 200 lb (90.719 kg)   Body mass index is 29.11 kg/(m^2). Ideal Body Weight: Weight in (lb) to have BMI = 25: 171.4    GEN: WDWN, NAD, Non-toxic,  Alert & Oriented x 3 HEENT: Atraumatic, Normocephalic.  Ears and Nose: No external deformity. EXTR: No clubbing/cyanosis/edema NEURO: Normal gait.  PSYCH: Normally interactive. Conversant. Not depressed or anxious appearing.  Calm demeanor.  RIGHT LEG:  Tender and ecchymotic locally mid shin.  Circumference Right calf 37.5 cm and Left 38.0 at 9 cm distal to tibial plateau.  Calf soft.  Homans' negative  Assessment and Plan: Contusion calf Elevate  Local heat Anaprox  Carmelina Dane, MD

## 2012-09-02 NOTE — Patient Instructions (Addendum)

## 2013-08-28 ENCOUNTER — Other Ambulatory Visit: Payer: Self-pay | Admitting: Family Medicine

## 2013-09-01 ENCOUNTER — Other Ambulatory Visit: Payer: Self-pay | Admitting: Family Medicine

## 2013-12-08 ENCOUNTER — Encounter: Payer: Self-pay | Admitting: Family Medicine

## 2013-12-18 ENCOUNTER — Other Ambulatory Visit: Payer: Self-pay | Admitting: Physician Assistant

## 2013-12-18 ENCOUNTER — Encounter: Payer: Self-pay | Admitting: Physician Assistant

## 2013-12-18 ENCOUNTER — Ambulatory Visit (INDEPENDENT_AMBULATORY_CARE_PROVIDER_SITE_OTHER): Payer: Managed Care, Other (non HMO) | Admitting: Physician Assistant

## 2013-12-18 VITALS — BP 164/92 | HR 60 | Temp 98.3°F | Resp 18 | Ht 67.0 in | Wt 198.0 lb

## 2013-12-18 DIAGNOSIS — Z125 Encounter for screening for malignant neoplasm of prostate: Secondary | ICD-10-CM

## 2013-12-18 DIAGNOSIS — G4700 Insomnia, unspecified: Secondary | ICD-10-CM

## 2013-12-18 DIAGNOSIS — J309 Allergic rhinitis, unspecified: Secondary | ICD-10-CM

## 2013-12-18 DIAGNOSIS — Z1211 Encounter for screening for malignant neoplasm of colon: Secondary | ICD-10-CM | POA: Insufficient documentation

## 2013-12-18 DIAGNOSIS — K219 Gastro-esophageal reflux disease without esophagitis: Secondary | ICD-10-CM | POA: Insufficient documentation

## 2013-12-18 DIAGNOSIS — F411 Generalized anxiety disorder: Secondary | ICD-10-CM

## 2013-12-18 DIAGNOSIS — E785 Hyperlipidemia, unspecified: Secondary | ICD-10-CM

## 2013-12-18 DIAGNOSIS — I10 Essential (primary) hypertension: Secondary | ICD-10-CM

## 2013-12-18 DIAGNOSIS — Z0000 Encounter for general adult medical examination without abnormal findings: Secondary | ICD-10-CM

## 2013-12-18 LAB — CBC WITH DIFFERENTIAL/PLATELET
BASOS PCT: 0 % (ref 0–1)
Basophils Absolute: 0 10*3/uL (ref 0.0–0.1)
EOS ABS: 0.1 10*3/uL (ref 0.0–0.7)
EOS PCT: 3 % (ref 0–5)
HEMATOCRIT: 41.4 % (ref 39.0–52.0)
HEMOGLOBIN: 14.5 g/dL (ref 13.0–17.0)
Lymphocytes Relative: 39 % (ref 12–46)
Lymphs Abs: 1.7 10*3/uL (ref 0.7–4.0)
MCH: 32.5 pg (ref 26.0–34.0)
MCHC: 35 g/dL (ref 30.0–36.0)
MCV: 92.8 fL (ref 78.0–100.0)
MONO ABS: 0.6 10*3/uL (ref 0.1–1.0)
MONOS PCT: 13 % — AB (ref 3–12)
Neutro Abs: 1.9 10*3/uL (ref 1.7–7.7)
Neutrophils Relative %: 45 % (ref 43–77)
Platelets: 117 10*3/uL — ABNORMAL LOW (ref 150–400)
RBC: 4.46 MIL/uL (ref 4.22–5.81)
RDW: 13.2 % (ref 11.5–15.5)
WBC: 4.3 10*3/uL (ref 4.0–10.5)

## 2013-12-18 LAB — COMPLETE METABOLIC PANEL WITH GFR
ALBUMIN: 4.5 g/dL (ref 3.5–5.2)
ALK PHOS: 143 U/L — AB (ref 39–117)
ALT: 108 U/L — ABNORMAL HIGH (ref 0–53)
AST: 104 U/L — ABNORMAL HIGH (ref 0–37)
BUN: 10 mg/dL (ref 6–23)
CO2: 25 mEq/L (ref 19–32)
CREATININE: 0.78 mg/dL (ref 0.50–1.35)
Calcium: 9.3 mg/dL (ref 8.4–10.5)
Chloride: 100 mEq/L (ref 96–112)
GFR, Est African American: >89 mL/min
GLUCOSE: 106 mg/dL — AB (ref 70–99)
Potassium: 4.1 mEq/L (ref 3.5–5.3)
Sodium: 137 mEq/L (ref 135–145)
Total Bilirubin: 0.6 mg/dL (ref 0.2–1.2)
Total Protein: 7.5 g/dL (ref 6.0–8.3)

## 2013-12-18 LAB — LIPID PANEL
CHOLESTEROL: 104 mg/dL (ref 0–200)
HDL: 32 mg/dL — ABNORMAL LOW (ref >39)
LDL Cholesterol: 40 mg/dL (ref 0–99)
Total CHOL/HDL Ratio: 3.3 Ratio
Triglycerides: 158 mg/dL — ABNORMAL HIGH (ref <150)
VLDL: 32 mg/dL (ref 0–40)

## 2013-12-18 LAB — TSH: TSH: 0.936 u[IU]/mL (ref 0.350–4.500)

## 2013-12-18 MED ORDER — MOMETASONE FUROATE 50 MCG/ACT NA SUSP: NASAL | Status: DC

## 2013-12-18 MED ORDER — CLONAZEPAM 0.5 MG PO TABS: 0.5000 mg | ORAL_TABLET | Freq: Every day | ORAL | Status: DC

## 2013-12-18 MED ORDER — LOSARTAN POTASSIUM 50 MG PO TABS: 50.0000 mg | ORAL_TABLET | Freq: Every day | ORAL | Status: DC

## 2013-12-18 MED ORDER — SERTRALINE HCL 50 MG PO TABS: 50.0000 mg | ORAL_TABLET | Freq: Every day | ORAL | Status: DC

## 2013-12-19 ENCOUNTER — Telehealth: Payer: Self-pay | Admitting: Family Medicine

## 2013-12-19 DIAGNOSIS — R945 Abnormal results of liver function studies: Principal | ICD-10-CM

## 2013-12-19 DIAGNOSIS — R7989 Other specified abnormal findings of blood chemistry: Secondary | ICD-10-CM

## 2013-12-19 LAB — VITAMIN D 25 HYDROXY (VIT D DEFICIENCY, FRACTURES): VIT D 25 HYDROXY: 48 ng/mL (ref 30–89)

## 2013-12-19 LAB — HEPATITIS PANEL, ACUTE
HCV AB: NEGATIVE
HEP A IGM: NONREACTIVE
HEP B C IGM: NONREACTIVE
HEP B S AG: NEGATIVE

## 2013-12-19 LAB — PSA: PSA: 0.67 ng/mL (ref <=4.00)

## 2013-12-19 NOTE — Telephone Encounter (Signed)
Pt aware of lab results and provider recommendations.  Told to stop Lipitor (cholesterol pill), avoid anything with Tylenol in it and avoid alcohol.  Come for repeat lab in two weeks.  Order placed

## 2013-12-19 NOTE — Telephone Encounter (Signed)
Message copied by Donne Anon on Fri Dec 19, 2013  4:02 PM ------      Message from: Allayne Butcher      Created: Fri Dec 19, 2013  3:19 PM       Tell patient that liver function tests are elevated. Tell him to stop the Lipitor. Recheck liver function tests in 2 weeks.      Tell him all other labs are normal. ------

## 2013-12-20 NOTE — Progress Notes (Signed)
Patient ID: Nathan RuffingBrian Roberts MRN: [id number], DOB: 12-Apr-1961, 53 y.o. Date of Encounter: @DATE @  Chief Complaint:  Chief Complaint  Patient presents with  . new pt est care    high stress, can't sleep, med refills, is fasting    HPI: 53 y.o. year old white male  presents as a new patient to establish care. As well, he has above complaints, and also, wants to do CPE.    He reports that he has just been going to an urgent care-- says he has never established a PCP.  HTN:  Reports that he had been told to take her Norvasc and Cozaar. States that the Cozaar ran out so he stayed off of it. Says he has started going to the gym and his blood pressure had decreased some so  he thought he could just stay off of it.  I asked him about the Gabepentin/Neurontin on his medicine list. He says that this was prescribed for migraines but it did not help. Says he only took it for 1 to 2 weeks. Remove this from his medicine list today. Says he is no longer having migraines.  Also asked about the Nasonex. He says it has run out. He does use it when he has problems with his allergies. Says anytime he is around grass or weeds he has allergy symptoms and does need a refill on this.  Asked about tramadol. He says this is for neck and shoulder pain which he has secondary to his work. Says that he bends over working on machinery and this causes a lot of pain in his neck and shoulders. Says that this tramadol has run out.  GERD: Reports he has no problems with this. Uses over-the-counter medicines rarely.  Anxiety: Has been separated for 5 years. At that time his wife started staying with her mom in [place] and has been back-and-forth between the 2 areas. Last summer they went to do the paperwork for divorce. She had no insurance-- that is why they have stayed legally married-- so she can stay on his insurance.  When she went to obtain insurance for herself, she found out she had colon cancer. He  states that she took chemotherapy for 6 weeks then they removed the tumor. The resection was done in January. Since then, he has been somewhat confused about the situation--says  at one point they had said she needed 6 more weeks of chemotherapy after the tumor was resected. However now they have said she needs chemotherapy for 7 months. Says that she just started this 7 month chemo yesterday. She is receiving this care in [place] with her mom.  He has one child-- a son, age 55. He went to college in [place] and graduated last year. He has his own apartment-- still in [place].  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 Says he's been with this particular company for 6 years but has done this type of work his entire life.  Says for the past month his mind has been racing. Says it is hard for him to relax and focus on the moment -- even at work he cannot focus on his work because his mind is racing with other thoughts. States that people at work have said things to him about this and he is afraid he will lose his job. Regular work hours  Supposed to be 7 AM to 3 PM. However he has been working 4 AM to  3 PM with a lot of overtime. Says this has been off and on for the past 2 years but 75% of the time of the past 2 years he has been working these hours. Also says that sometimes he works 7 days per week with 11-12 hours per day. Says that it has been these hours a lot over the past 2 years. Says that this has been a little better over the last 6 months. Says that "normal " supposed to be 5 days a week.  Says his goal is to be in bed at 7 PM but sometimes it's 8 or 8:30. Says that he often tosses and turns. However says even if he is able to fall asleep-- he will wake up after just an hour. Says this difficulty sleeping started around October.  I told patient that I saw some mention of anxiety and insomnia in Epic. He says that was in 07/27/2013when  his father died. Says that he did not use any prescription medicines at that time but had mentioned it to his medical provider.  He lives in [place] but works near [employer].    Past Medical History  Diagnosis Date  . Allergy   . GERD (gastroesophageal reflux disease)   . Carpal tunnel syndrome   . Hypertension   . Arthritis   . Hyperlipidemia      Home Meds: Outpatient Prescriptions Prior to Visit  Medication Sig Dispense Refill  . amLODipine (NORVASC) 10 MG tablet take 1 tablet by mouth once daily  90 tablet  1  . atorvastatin (LIPITOR) 40 MG tablet take 1 tablet by mouth once daily  90 tablet  0  . Multiple Vitamin (MULTIVITAMIN WITH MINERALS) TABS Take 1 tablet by mouth daily.      . sildenafil (VIAGRA) 50 MG tablet Take 50 mg by mouth as needed.      . gabapentin (NEURONTIN) 100 MG capsule Take 100 mg by mouth 3 (three) times daily as needed. For headaches      . losartan (COZAAR) 25 MG tablet Take 1 tablet (25 mg total) by mouth daily.  100 tablet  3  . mometasone (NASONEX) 50 MCG/ACT nasal spray 2 spray each nostril qd  17 g  3  . traMADol (ULTRAM) 50 MG tablet Take 50 mg by mouth every 6 (six) hours as needed. For back pain       No facility-administered medications prior to visit.    Allergies:  Allergies  Allergen Reactions  . Latex Hives    History   Social History  . Marital Status: Married    Spouse Name: N/A    Number of Children: N/A  . Years of Education: N/A   Occupational History  . Not on file.   Social History Main Topics  . Smoking status: Former Smoker    Types: Cigarettes    Quit date: 12/08/1996  . Smokeless tobacco: Never Used  . Alcohol Use: 5.3 oz/week    2 Glasses of wine, 5 Cans of beer, 1 Shots of liquor, 1 Drinks containing 0.5 oz of alcohol per week  . Drug Use: No  . Sexual Activity: Not Currently   Other Topics Concern  . Not on file   Social History Narrative  . No narrative on file    Family History  Problem  Relation Age of Onset  . Cancer Mother 66    lung--smoker  . Early death Mother   . Atrial fibrillation Father   . Heart  disease Father   . COPD Father   . Hyperlipidemia Father   . Hypertension Father   . Colon cancer Neg Hx   . Esophageal cancer Neg Hx   . Stomach cancer Neg Hx   . Rectal cancer Neg Hx      Review of Systems:  See HPI for pertinent ROS. All other ROS negative.    Physical Exam: Blood pressure 164/92, pulse 60, temperature 98.3 F (36.8 C), temperature source Oral, resp. rate 18, height 5\' 7"  (1.702 m), weight 198 lb (89.812 kg)., Body mass index is 31 kg/(m^2). General: WNWD WM. Appears in no acute distress. Neck: Supple. No thyromegaly. No lymphadenopathy. No carotid bruits.  Lungs: Clear bilaterally to auscultation without wheezes, rales, or rhonchi. Breathing is unlabored. Heart: RRR with S1 S2. No murmurs, rubs, or gallops. Musculoskeletal:  Strength and tone normal for age. Extremities/Skin: Warm and dry. No edema.  Neuro: Alert and oriented X 3. Moves all extremities spontaneously. Gait is normal. CNII-XII grossly in tact. Psych: He seems somewhat stressed and anxious, but overall mood and affect very appropriate and normal.  Responds to questions appropriately .     ASSESSMENT AND PLAN:  53 y.o. year old male with   1. Hypertension Uncontrolled. Cont Norvasc. Add Cozaar (he used this in past with no adv effects.Not currently taking--see HPI) RTC 2 weeks to receck BP and BMET  - losartan (COZAAR) 50 MG tablet; Take 1 tablet (50 mg total) by mouth daily.  Dispense: 30 tablet; Refill: 1 - COMPLETE METABOLIC PANEL WITH GFR  2. Hyperlipidemia I AM NOT SURE IF HE IS TAKING LIPITOR--I DONT THINK SO--WILL CLARIFY THIS AT F/U OV IN 2 WEEKS - COMPLETE METABOLIC PANEL WITH GFR - Lipid panel   3. Insomnia Will Rx Klonopin. Told him if he sees no effect 30 minutes after taking this, then he can take a second dose. - clonazePAM (KLONOPIN) 0.5 MG tablet;  Take 1 tablet (0.5 mg total) by mouth at bedtime.  Dispense: 30 tablet; Refill: 0  4. Generalized anxiety disorder Discussed, at length, proper expectations and use of these medications.  He will have a followup office visit 6 weeks from now to follow up the effectiveness of the Zoloft. - sertraline (ZOLOFT) 50 MG tablet; Take 1 tablet (50 mg total) by mouth daily.  Dispense: 30 tablet; Refill: 3 - clonazePAM (KLONOPIN) 0.5 MG tablet; Take 1 tablet (0.5 mg total) by mouth at bedtime.  Dispense: 30 tablet; Refill: 0  5. Allergic rhinitis - mometasone (NASONEX) 50 MCG/ACT nasal spray; 2 spray each nostril qd  Dispense: 17 g; Refill: 3  6. GERD (gastroesophageal reflux disease) Rarely has symptoms of this. Controlled with OTC meds -rarely needed  7. Visit for preventive health examination He wants to have CPE in near future. Is fasting now.  Will obtain labs now and will complete exam and preventive care at follow up visits.  He will be needing to have f/u in 2 weeks for HTN and 6 weeks for his anxiety--can code one of those as CPE--Today coded New 4. - CBC with Differential - COMPLETE METABOLIC PANEL WITH GFR - Lipid panel - PSA - TSH - Vit D  25 hydroxy (rtn osteoporosis monitoring)   8. Prostate cancer screening Check PSA now.  9. His mother died at age 69 secondary to Lung Cancer (Dx at age 53)--Pt reports she was a heavy smoker. He quit smoking in year 2000. Will discuss screening CXR at f/u.   Signed, Shon Hale  Aveen Stansel, [place], [facility] 12/20/2013 7:13 AM

## 2014-01-05 ENCOUNTER — Ambulatory Visit: Payer: Managed Care, Other (non HMO) | Admitting: Physician Assistant

## 2014-01-07 ENCOUNTER — Ambulatory Visit (INDEPENDENT_AMBULATORY_CARE_PROVIDER_SITE_OTHER): Payer: Managed Care, Other (non HMO) | Admitting: Physician Assistant

## 2014-01-07 ENCOUNTER — Encounter: Payer: Self-pay | Admitting: Physician Assistant

## 2014-01-07 VITALS — BP 142/84 | HR 64 | Temp 97.3°F | Resp 18 | Wt 197.0 lb

## 2014-01-07 DIAGNOSIS — R945 Abnormal results of liver function studies: Principal | ICD-10-CM

## 2014-01-07 DIAGNOSIS — E785 Hyperlipidemia, unspecified: Secondary | ICD-10-CM

## 2014-01-07 DIAGNOSIS — I10 Essential (primary) hypertension: Secondary | ICD-10-CM

## 2014-01-07 DIAGNOSIS — Z801 Family history of malignant neoplasm of trachea, bronchus and lung: Secondary | ICD-10-CM

## 2014-01-07 DIAGNOSIS — K219 Gastro-esophageal reflux disease without esophagitis: Secondary | ICD-10-CM

## 2014-01-07 DIAGNOSIS — G4700 Insomnia, unspecified: Secondary | ICD-10-CM

## 2014-01-07 DIAGNOSIS — Z125 Encounter for screening for malignant neoplasm of prostate: Secondary | ICD-10-CM

## 2014-01-07 DIAGNOSIS — F411 Generalized anxiety disorder: Secondary | ICD-10-CM

## 2014-01-07 DIAGNOSIS — R7989 Other specified abnormal findings of blood chemistry: Secondary | ICD-10-CM

## 2014-01-07 LAB — BASIC METABOLIC PANEL WITH GFR
BUN: 20 mg/dL (ref 6–23)
CO2: 26 mEq/L (ref 19–32)
CREATININE: 0.83 mg/dL (ref 0.50–1.35)
Calcium: 9.3 mg/dL (ref 8.4–10.5)
Chloride: 100 mEq/L (ref 96–112)
GFR, Est African American: >89 mL/min
Glucose, Bld: 86 mg/dL (ref 70–99)
POTASSIUM: 4.3 meq/L (ref 3.5–5.3)
SODIUM: 134 meq/L — AB (ref 135–145)

## 2014-01-07 LAB — HEPATIC FUNCTION PANEL
ALT: 82 U/L — ABNORMAL HIGH (ref 0–53)
AST: 80 U/L — ABNORMAL HIGH (ref 0–37)
Albumin: 4.4 g/dL (ref 3.5–5.2)
Alkaline Phosphatase: 109 U/L (ref 39–117)
Bilirubin, Direct: 0.2 mg/dL (ref 0.0–0.3)
Indirect Bilirubin: 0.4 mg/dL (ref 0.2–1.2)
TOTAL PROTEIN: 6.9 g/dL (ref 6.0–8.3)
Total Bilirubin: 0.6 mg/dL (ref 0.2–1.2)

## 2014-01-07 MED ORDER — ZOLPIDEM TARTRATE ER 12.5 MG PO TBCR: 12.5000 mg | EXTENDED_RELEASE_TABLET | Freq: Every evening | ORAL | Status: DC | PRN

## 2014-01-07 NOTE — Progress Notes (Signed)
Patient ID: Nathan Roberts MRN: [id number], DOB: 1960-10-30, 53 y.o. Date of Encounter: @DATE @  Chief Complaint:  Chief Complaint  Patient presents with  . 2 week follow up    elevated LFT's, req Rf Tranadol for back pain    HPI: 53 y.o. year old white male  Presents for f/u OV.   He was seen by me 12/18/13  as a new patient to establish care.  As well, he had specific complaints, and also, wanted to do CPE.  I did not code that OV as a CPE but went ahead and did the fasting labs that day.    At that initial OV he reported that he had just been going to an urgent care-- said he had never established a PCP.  AT INITIAL OV 12/18/13  WE DISCUSSED:  HTN:   At initial OV 12/18/13 he reported that he had been told to take Norvasc and Cozaar. Stated that the Cozaar ran out so he stayed off of it. Says he had started going to the gym and his blood pressure had decreased some so  he thought he could just stay off of it.  I asked him about the Gabepentin/Neurontin on his medicine list. He says that this was prescribed for migraines but it did not help. Says he only took it for 1 to 2 weeks. Remove this from his medicine list today. Says he is no longer having migraines.  Also asked about the Nasonex. He says it has run out. He does use it when he has problems with his allergies. Says anytime he is around grass or weeds he has allergy symptoms and does need a refill on this.  Asked about tramadol. He says this is for neck and shoulder pain which he has secondary to his work. Says that he bends over working on machinery and this causes a lot of pain in his neck and shoulders. Says that this tramadol has run out.  GERD: Reports he has no problems with this. Uses over-the-counter medicines rarely.  Anxiety: Has been separated for 5 years. At that time his wife started staying with her mom in [place] and has been back-and-forth between the 2 areas. Last summer they went to do the  paperwork for divorce. She had no insurance-- that is why they have stayed legally married-- so she can stay on his insurance.  When she went to obtain insurance for herself, she found out she had colon cancer. He states that she took chemotherapy for 6 weeks then they removed the tumor. The resection was done in January. Since then, he has been somewhat confused about the situation--says  at one point they had said she needed 6 more weeks of chemotherapy after the tumor was resected. However now they have said she needs chemotherapy for 7 months. Says that she just started this 7 month chemo yesterday. She is receiving this care in [place] with her mom.  He has one child-- a son, age 48. He went to college in [place] and graduated last year. He has his own apartment-- still in [place].  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 Says he's been with this particular company for 6 years but has done this type of work his entire life.  Says for the past month his mind has been racing. Says it is hard for him to relax and focus on the moment -- even at work he cannot focus on his  work because his mind is racing with other thoughts. States that people at work have said things to him about this and he is afraid he will lose his job. Regular work hours  Supposed to be 7 AM to 3 PM. However he has been working 4 AM to 3 PM with a lot of overtime. Says this has been off and on for the past 2 years but 75% of the time of the past 2 years he has been working these hours. Also says that sometimes he works 7 days per week with 11-12 hours per day. Says that it has been these hours a lot over the past 2 years. Says that this has been a little better over the last 6 months. Says that "normal " supposed to be 5 days a week.  Says his goal is to be in bed at 7 PM but sometimes it's 8 or 8:30. Says that he often tosses and turns. However says even if he is able  to fall asleep-- he will wake up after just an hour. Says this difficulty sleeping started around October.  I told patient that I saw some mention of anxiety and insomnia in Epic. He says that was in 07-11-2013when his father died. Says that he did not use any prescription medicines at that time but had mentioned it to his medical provider.  He lives in [place] but works near [employer].  TODAY:  States that he did add the Cozaar as directed. He is taking this daily and is having no adverse effects. He is taking the Zoloft daily. He is having no adverse effects with this. He has been taking the Klonopin at night but it is not working well for the insomnia. He says that only on the weekends when he does not have to wake up to work the next morning he has tried taking 2 pills at a time. Says even taking 2 at a time does not work very well. He says this medicine helps him fall asleep but then he still wakes up after just 2 or 2-1/2 hours. Says in the past he has used Ambien CR and it worked well for him. Says that the plain Ambien did not work.  Past Medical History  Diagnosis Date  . Allergy   . GERD (gastroesophageal reflux disease)   . Carpal tunnel syndrome   . Hypertension   . Arthritis   . Hyperlipidemia      Home Meds: Outpatient Prescriptions Prior to Visit  Medication Sig Dispense Refill  . amLODipine (NORVASC) 10 MG tablet take 1 tablet by mouth once daily  90 tablet  1  . clonazePAM (KLONOPIN) 0.5 MG tablet Take 1 tablet (0.5 mg total) by mouth at bedtime.  30 tablet  0  . losartan (COZAAR) 50 MG tablet Take 1 tablet (50 mg total) by mouth daily.  30 tablet  1  . mometasone (NASONEX) 50 MCG/ACT nasal spray 2 spray each nostril qd  17 g  3  . Multiple Vitamin (MULTIVITAMIN WITH MINERALS) TABS Take 1 tablet by mouth daily.      . sertraline (ZOLOFT) 50 MG tablet Take 1 tablet (50 mg total) by mouth daily.  30 tablet  3  . sildenafil (VIAGRA) 50 MG tablet Take 50 mg by mouth  as needed.      Marland Kitchen atorvastatin (LIPITOR) 40 MG tablet take 1 tablet by mouth once daily  90 tablet  0   No facility-administered medications prior to visit.  Allergies:  Allergies  Allergen Reactions  . Latex Hives    History   Social History  . Marital Status: Married    Spouse Name: N/A    Number of Children: N/A  . Years of Education: N/A   Occupational History  . Not on file.   Social History Main Topics  . Smoking status: Former Smoker    Types: Cigarettes    Quit date: 12/08/1996  . Smokeless tobacco: Never Used  . Alcohol Use: 5.3 oz/week    2 Glasses of wine, 5 Cans of beer, 1 Shots of liquor, 1 Drinks containing 0.5 oz of alcohol per week  . Drug Use: No  . Sexual Activity: Not Currently   Other Topics Concern  . Not on file   Social History Narrative  . No narrative on file    Family History  Problem Relation Age of Onset  . Cancer Mother [id number]    lung--smoker  . Early death Mother   . Atrial fibrillation Father   . Heart disease Father   . COPD Father   . Hyperlipidemia Father   . Hypertension Father   . Colon cancer Neg Hx   . Esophageal cancer Neg Hx   . Stomach cancer Neg Hx   . Rectal cancer Neg Hx      Review of Systems:  See HPI for pertinent ROS. All other ROS negative.    Physical Exam: Blood pressure 142/84, pulse 64, temperature 97.3 F (36.3 C), temperature source Oral, resp. rate 18, weight 197 lb (89.359 kg)., Body mass index is 30.85 kg/(m^2). General: WNWD WM. Appears in no acute distress. Neck: Supple. No thyromegaly. No lymphadenopathy. No carotid bruits.  Lungs: Clear bilaterally to auscultation without wheezes, rales, or rhonchi. Breathing is unlabored. Heart: RRR with S1 S2. No murmurs, rubs, or gallops. Musculoskeletal:  Strength and tone normal for age. Extremities/Skin: Warm and dry. No edema.  Neuro: Alert and oriented X 3. Moves all extremities spontaneously. Gait is normal. CNII-XII grossly in tact. Psych: He  seems somewhat stressed and anxious, but overall mood and affect very appropriate and normal.  Responds to questions appropriately .     ASSESSMENT AND PLAN:  53 y.o. year old male with   1. Elevated LFTs 12/18/13 we did screening labs. LFTs were elevated with alkaline phosphatase 143, AST 104, ALT 108. He should states that he was taking Lipitor daily prior to that time. At that time we put the Lipitor on hold. He says he has not taken it since that time. Recheck LFTs today.  He says  that he takes no Tylenol. Says over the course of an entire weekend he will drink a 12 pack of beer. During the week drinks no alcohol. On the weekend he drinks no other types of alcohol to include any liquor or wine.  2. Hypertension Controlled. Cont Norvasc. Cont Cozaar. Recheck BMET on Cozaar. (added at LOV)  3. Hyperlipidemia See # 1 -- Lipitor on hold secondary to elevated LFTs.   4. Insomnia Ambien CR 12.5 mg  --prescription printed for #30+2 additional refills  5. Generalized anxiety disorder At LOV--Discussed, at length, proper expectations and use of these medications.  He will have a followup office visit in 4 weeks (6 weeks from time Zoloft started)  to follow up the effectiveness of the Zoloft.  6. Allergic rhinitis - mometasone (NASONEX) 50 MCG/ACT nasal spray; 2 spray each nostril qd  Dispense: 17 g; Refill: 3  7. GERD (gastroesophageal reflux disease) Rarely  has symptoms of this. Controlled with OTC meds -rarely needed  8. Visit for preventive health examination At his OV 12/18/13 he was wanting to do a complete physical exam but there were too many issues to address. He was fasting at that time so we went ahead and obtained fasting labs but coded that visit as a New Level 4. TODAY, CODED THIS OV AS CPE.    A. Screening Labs--were drawn 12/18/13-- all were normal except for elevated LFTs - CBC with Differential - COMPLETE METABOLIC PANEL WITH GFR - Lipid panel - PSA - TSH - Vit  D  25 hydroxy (rtn osteoporosis monitoring)  B. Prostate Cancer Screening: PSA normal 12/18/13.   C. Colorectal Cancer Screening: Done  01/11/2012   D.Lung Cancer Screening:   His mother died at age 37 secondary to Lung Cancer (Dx at age 22)--Pt reports she was a heavy smoker. He quit smoking in year 2000.  Today I discussed obtaining a screening chest x-ray and he is agreeable. Order placed for chest x-ray  E.Immunizations:  Tdap 11/01/2011  He will followup for office visit 4 weeks from now. In The interim I will follow up results of the BMET and LFTs and also followup results of chest x-ray.  THIS OV CODED AS CPE.    Signed, [address] [place], [place], BSFM 01/07/2014 1:25 PM

## 2014-02-02 ENCOUNTER — Ambulatory Visit (INDEPENDENT_AMBULATORY_CARE_PROVIDER_SITE_OTHER): Payer: Managed Care, Other (non HMO) | Admitting: Physician Assistant

## 2014-02-02 ENCOUNTER — Ambulatory Visit
Admission: RE | Admit: 2014-02-02 | Discharge: 2014-02-02 | Disposition: A | Discharge status: Home / Self Care | Payer: Managed Care, Other (non HMO) | Source: Ambulatory Visit | Attending: Physician Assistant | Admitting: Physician Assistant

## 2014-02-02 ENCOUNTER — Encounter: Payer: Self-pay | Admitting: Physician Assistant

## 2014-02-02 VITALS — BP 140/80 | HR 80 | Temp 98.4°F | Resp 18 | Wt 192.0 lb

## 2014-02-02 DIAGNOSIS — F411 Generalized anxiety disorder: Secondary | ICD-10-CM

## 2014-02-02 DIAGNOSIS — R7989 Other specified abnormal findings of blood chemistry: Secondary | ICD-10-CM

## 2014-02-02 DIAGNOSIS — R945 Abnormal results of liver function studies: Secondary | ICD-10-CM

## 2014-02-02 DIAGNOSIS — Z801 Family history of malignant neoplasm of trachea, bronchus and lung: Secondary | ICD-10-CM

## 2014-02-02 DIAGNOSIS — E785 Hyperlipidemia, unspecified: Secondary | ICD-10-CM

## 2014-02-02 DIAGNOSIS — Z125 Encounter for screening for malignant neoplasm of prostate: Secondary | ICD-10-CM

## 2014-02-02 DIAGNOSIS — G4700 Insomnia, unspecified: Secondary | ICD-10-CM

## 2014-02-02 DIAGNOSIS — I10 Essential (primary) hypertension: Secondary | ICD-10-CM

## 2014-02-02 MED ORDER — LOSARTAN POTASSIUM 100 MG PO TABS: 100.0000 mg | ORAL_TABLET | Freq: Every day | ORAL | Status: DC

## 2014-02-02 MED ORDER — SERTRALINE HCL 100 MG PO TABS: 100.0000 mg | ORAL_TABLET | Freq: Every day | ORAL | Status: DC

## 2014-02-02 NOTE — Progress Notes (Signed)
Patient ID: Sherry RuffingBrian Brashears MRN: [id number], DOB: 1961/03/12, 53 y.o. Date of Encounter: @DATE @  Chief Complaint:  Chief Complaint  Patient presents with  . lump on chest    right side chest, soreness    HPI: 53 y.o. year old white male  Presents for f/u OV.   He was seen by me 12/18/13  as a new patient to establish care.  As well, he had specific complaints, and also, wanted to do CPE.  I did not code that OV as a CPE but went ahead and did the fasting labs that day.    At that initial OV he reported that he had just been going to an urgent care-- said he had never established a PCP.  AT INITIAL OV 12/18/13  WE DISCUSSED:  HTN:   At initial OV 12/18/13 he reported that he had been told to take Norvasc and Cozaar. Stated that the Cozaar ran out so he stayed off of it. Says he had started going to the gym and his blood pressure had decreased some so  he thought he could just stay off of it.  I asked him about the Gabepentin/Neurontin on his medicine list. He says that this was prescribed for migraines but it did not help. Says he only took it for 1 to 2 weeks. Remove this from his medicine list today. Says he is no longer having migraines.  Also asked about the Nasonex. He says it has run out. He does use it when he has problems with his allergies. Says anytime he is around grass or weeds he has allergy symptoms and does need a refill on this.  Asked about tramadol. He says this is for neck and shoulder pain which he has secondary to his work. Says that he bends over working on machinery and this causes a lot of pain in his neck and shoulders. Says that this tramadol has run out.  GERD: Reports he has no problems with this. Uses over-the-counter medicines rarely.  Anxiety: Has been separated for 5 years. At that time his wife started staying with her mom in [place] and has been back-and-forth between the 2 areas. Last summer they went to do the paperwork for divorce. She  had no insurance-- that is why they have stayed legally married-- so she can stay on his insurance.  When she went to obtain insurance for herself, she found out she had colon cancer. He states that she took chemotherapy for 6 weeks then they removed the tumor. The resection was done in January. Since then, he has been somewhat confused about the situation--says  at one point they had said she needed 6 more weeks of chemotherapy after the tumor was resected. However now they have said she needs chemotherapy for 7 months. Says that she just started this 7 month chemo yesterday. She is receiving this care in [place] with her mom.  He has one child-- a son, age 424. He went to college in [place] and graduated last year. He has his own apartment-- still in [place].  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 Says he's been with this particular company for 6 years but has done this type of work his entire life.  Says for the past month his mind has been racing. Says it is hard for him to relax and focus on the moment -- even at work he cannot focus on his work because his mind is  racing with other thoughts. States that people at work have said things to him about this and he is afraid he will lose his job. Regular work hours  Supposed to be 7 AM to 3 PM. However he has been working 4 AM to 3 PM with a lot of overtime. Says this has been off and on for the past 2 years but 75% of the time of the past 2 years he has been working these hours. Also says that sometimes he works 7 days per week with 11-12 hours per day. Says that it has been these hours a lot over the past 2 years. Says that this has been a little better over the last 6 months. Says that "normal " supposed to be 5 days a week.  Says his goal is to be in bed at 7 PM but sometimes it's 8 or 8:30. Says that he often tosses and turns. However says even if he is able to fall asleep-- he will  wake up after just an hour. Says this difficulty sleeping started around October.  I told patient that I saw some mention of anxiety and insomnia in Epic. He says that was in July 20, 2013when his father died. Says that he did not use any prescription medicines at that time but had mentioned it to his medical provider.  He lives in [place] but works near [employer].  AT THE 12/18/2013, I HAD THE FOLLOWING ASSESSMENT/PLAN:  1. Hypertension  Uncontrolled. Cont Norvasc. Add Cozaar (he used this in past with no adv effects.Not currently taking--see HPI)  RTC 2 weeks to receck BP and BMET  - losartan (COZAAR) 50 MG tablet; Take 1 tablet (50 mg total) by mouth daily. Dispense: 30 tablet; Refill: 1  - COMPLETE METABOLIC PANEL WITH GFR  2. Hyperlipidemia  I AM NOT SURE IF HE IS TAKING LIPITOR--I DONT THINK SO--WILL CLARIFY THIS AT F/U OV IN 2 WEEKS  - COMPLETE METABOLIC PANEL WITH GFR  - Lipid panel  3. Insomnia  Will Rx Klonopin. Told him if he sees no effect 30 minutes after taking this, then he can take a second dose.  - clonazePAM (KLONOPIN) 0.5 MG tablet; Take 1 tablet (0.5 mg total) by mouth at bedtime. Dispense: 30 tablet; Refill: 0  4. Generalized anxiety disorder  Discussed, at length, proper expectations and use of these medications.  He will have a followup office visit 6 weeks from now to follow up the effectiveness of the Zoloft.  - sertraline (ZOLOFT) 50 MG tablet; Take 1 tablet (50 mg total) by mouth daily. Dispense: 30 tablet; Refill: 3  - clonazePAM (KLONOPIN) 0.5 MG tablet; Take 1 tablet (0.5 mg total) by mouth at bedtime. Dispense: 30 tablet; Refill: 0  5. Allergic rhinitis  - mometasone (NASONEX) 50 MCG/ACT nasal spray; 2 spray each nostril qd Dispense: 17 g; Refill: 3  6. GERD (gastroesophageal reflux disease)  Rarely has symptoms of this. Controlled with OTC meds -rarely needed  7. Visit for preventive health examination  He wants to have CPE in near future. Is fasting  now.  Will obtain labs now and will complete exam and preventive care at follow up visits.  He will be needing to have f/u in 2 weeks for HTN and 6 weeks for his anxiety--can code one of those as CPE--Today coded New 4.  - CBC with Differential  - COMPLETE METABOLIC PANEL WITH GFR  - Lipid panel  - PSA  - TSH  - Vit D 25  hydroxy (rtn osteoporosis monitoring)  8. Prostate cancer screening  Check PSA now.  9. His mother died at age 52 secondary to Lung Cancer (Dx at age 65)--Pt reports she was a heavy smoker. He quit smoking in year 2000. Will discuss screening CXR at f/u.      AT OV 01/07/2014 HE REPORTED:  States that he did add the Cozaar as directed. He is taking this daily and is having no adverse effects. He is taking the Zoloft daily. He is having no adverse effects with this. He has been taking the Klonopin at night but it is not working well for the insomnia. He says that only on the weekends when he does not have to wake up to work the next morning he has tried taking 2 pills at a time. Says even taking 2 at a time does not work very well. He says this medicine helps him fall asleep but then he still wakes up after just 2 or 2-1/2 hours. Says in the past he has used Ambien CR and it worked well for him. Says that the plain Ambien did not work.  AT THE 01/07/14 OV MY ASSESSMENT/PLAN WAS:  1. Elevated LFTs  12/18/13 we did screening labs. LFTs were elevated with alkaline phosphatase 143, AST 104, ALT 108.  He should states that he was taking Lipitor daily prior to that time.  At that time we put the Lipitor on hold. He says he has not taken it since that time.  Recheck LFTs today.  He says that he takes no Tylenol.  Says over the course of an entire weekend he will drink a 12 pack of beer. During the week drinks no alcohol. On the weekend he drinks no other types of alcohol to include any liquor or wine.  2. Hypertension  Controlled. Cont Norvasc. Cont Cozaar.  Recheck BMET on  Cozaar. (added at LOV)  3. Hyperlipidemia  See # 1 -- Lipitor on hold secondary to elevated LFTs.  4. Insomnia  Ambien CR 12.5 mg --prescription printed for #30+2 additional refills  5. Generalized anxiety disorder  At LOV--Discussed, at length, proper expectations and use of these medications.  He will have a followup office visit in 4 weeks (6 weeks from time Zoloft started) to follow up the effectiveness of the Zoloft.  6. Allergic rhinitis  - mometasone (NASONEX) 50 MCG/ACT nasal spray; 2 spray each nostril qd Dispense: 17 g; Refill: 3  7. GERD (gastroesophageal reflux disease)  Rarely has symptoms of this. Controlled with OTC meds -rarely needed  8. Visit for preventive health examination  At his OV 12/18/13 he was wanting to do a complete physical exam but there were too many issues to address. He was fasting at that time so we went ahead and obtained fasting labs but coded that visit as a New Level 4. TODAY, CODED THIS OV AS CPE.  A. Screening Labs--were drawn 12/18/13-- all were normal except for elevated LFTs  - CBC with Differential  - COMPLETE METABOLIC PANEL WITH GFR  - Lipid panel  - PSA  - TSH  - Vit D 25 hydroxy (rtn osteoporosis monitoring)  B. Prostate Cancer Screening: PSA normal 12/18/13.  C. Colorectal Cancer Screening: Done 01/11/2012  D.Lung Cancer Screening:  His mother died at age 86 secondary to Lung Cancer (Dx at age 65)--Pt reports she was a heavy smoker. He quit smoking in year 2000.  Today I discussed obtaining a screening chest x-ray and he is agreeable.  Order placed  for chest x-ray  E.Immunizations:  Tdap 11/01/2011  He will followup for office visit 4 weeks from now.  In The interim I will follow up results of the BMET and LFTs and also followup results of chest x-ray.  THIS OV CODED AS CPE.    TODAY--02-27-2014-- HE REPORTS:  He reports that he can tell some decrease in his anxiety since starting the Zoloft but says that he still feels anxious. Reports no  adverse effects.  In regards to his elevated LFTs, Lipitor is on hold. He says that he has not decreased alcohol as much as he should. Says that he actually is on vacation from work for 2 weeks and has been off this past week as well as the upcoming week. Says that he has been spending time at the pool and drinking alcohol there.   Past Medical History  Diagnosis Date  . Allergy   . GERD (gastroesophageal reflux disease)   . Carpal tunnel syndrome   . Hypertension   . Arthritis   . Hyperlipidemia      Home Meds: Outpatient Prescriptions Prior to Visit  Medication Sig Dispense Refill  . amLODipine (NORVASC) 10 MG tablet take 1 tablet by mouth once daily  90 tablet  1  . atorvastatin (LIPITOR) 40 MG tablet take 1 tablet by mouth once daily  90 tablet  0  . clonazePAM (KLONOPIN) 0.5 MG tablet Take 1 tablet (0.5 mg total) by mouth at bedtime.  30 tablet  0  . mometasone (NASONEX) 50 MCG/ACT nasal spray 2 spray each nostril qd  17 g  3  . Multiple Vitamin (MULTIVITAMIN WITH MINERALS) TABS Take 1 tablet by mouth daily.      . sildenafil (VIAGRA) 50 MG tablet Take 50 mg by mouth as needed.      . zolpidem (AMBIEN CR) 12.5 MG CR tablet Take 1 tablet (12.5 mg total) by mouth at bedtime as needed for sleep.  30 tablet  2  . losartan (COZAAR) 50 MG tablet Take 1 tablet (50 mg total) by mouth daily.  30 tablet  1  . sertraline (ZOLOFT) 50 MG tablet Take 1 tablet (50 mg total) by mouth daily.  30 tablet  3   No facility-administered medications prior to visit.    Allergies:  Allergies  Allergen Reactions  . Latex Hives    History   Social History  . Marital Status: Married    Spouse Name: N/A    Number of Children: N/A  . Years of Education: N/A   Occupational History  . Not on file.   Social History Main Topics  . Smoking status: Former Smoker    Types: Cigarettes    Quit date: 12/08/1996  . Smokeless tobacco: Never Used  . Alcohol Use: 5.3 oz/week    2 Glasses of wine, 5  Cans of beer, 1 Shots of liquor, 1 Drinks containing 0.5 oz of alcohol per week  . Drug Use: No  . Sexual Activity: Not Currently   Other Topics Concern  . Not on file   Social History Narrative  . No narrative on file    Family History  Problem Relation Age of Onset  . Cancer Mother 63    lung--smoker  . Early death Mother   . Atrial fibrillation Father   . Heart disease Father   . COPD Father   . Hyperlipidemia Father   . Hypertension Father   . Colon cancer Neg Hx   . Esophageal cancer Neg Hx   .  Stomach cancer Neg Hx   . Rectal cancer Neg Hx      Review of Systems:  See HPI for pertinent ROS. All other ROS negative.    Physical Exam: Blood pressure 140/80, pulse 80, temperature 98.4 F (36.9 C), temperature source Oral, resp. rate 18, weight 192 lb (87.091 kg)., Body mass index is 30.06 kg/(m^2). General: WNWD WM. Appears in no acute distress. Neck: Supple. No thyromegaly. No lymphadenopathy. No carotid bruits.  Lungs: Clear bilaterally to auscultation without wheezes, rales, or rhonchi. Breathing is unlabored. Heart: RRR with S1 S2. No murmurs, rubs, or gallops. Musculoskeletal:  Strength and tone normal for age. Extremities/Skin: Warm and dry. No edema.  Neuro: Alert and oriented X 3. Moves all extremities spontaneously. Gait is normal. CNII-XII grossly in tact. Psych: He seems somewhat stressed and anxious, but overall mood and affect very appropriate and normal.  Responds to questions appropriately .     ASSESSMENT AND PLAN:  53 y.o. year old male with   1. Elevated LFTs 12/18/13 we did screening labs. LFTs were elevated with alkaline phosphatase 143, AST 104, ALT 108. He states that he was taking Lipitor daily prior to that time. At that time we put the Lipitor on hold. He says he has not taken it since that time.  At the time the elevated LFTs were diagnosed, he said  that he takes no Tylenol. At that time he said that over the course of an entire weekend  he will drink a 12 pack of beer. During the week drinks no alcohol. On the weekend he drinks no other types of alcohol to include any liquor or wine. Today-02/02/14- says he is on vacation from work for 2 weeks and has been sitting at the pool drinking some alcohol there.  F/U LFTs were decreased but not back to normal. Recheck again now.   2. Hypertension Controlled. Cont Norvasc. Cont Cozaar. BMET checked 02/05/14.  3. Hyperlipidemia See # 1 -- Lipitor on hold secondary to elevated LFTs.   4. Insomnia Ambien CR 12.5 mg  --prescription printed for #30+2 additional refills at OV 02-05-14.  Cont this. Patient states that this is working well. Says that he is able to get 5-6 hours of sleep per night with Ambien CR which is much better than the past when he was only getting about 3 hours of sleep.  5. Generalized anxiety disorder Improved, but not well controlled. Increase Zoloft from 50 mg to 100 mg. Schedule f/u OV 6 weeks.   6. Allergic rhinitis - mometasone (NASONEX) 50 MCG/ACT nasal spray; 2 spray each nostril qd  Dispense: 17 g; Refill: 3  7. GERD (gastroesophageal reflux disease) Rarely has symptoms of this. Controlled with OTC meds -rarely needed  8. Visit for preventive health examination At his OV 12/18/13 he was wanting to do a complete physical exam but there were too many issues to address. He was fasting at that time so we went ahead and obtained fasting labs but coded that visit as a New Level 4.  Coded OV on 02/05/14 as CPE.  A. Screening Labs--were drawn 12/18/13-- all were normal except for elevated LFTs - CBC with Differential - COMPLETE METABOLIC PANEL WITH GFR - Lipid panel - PSA - TSH - Vit D  25 hydroxy (rtn osteoporosis monitoring)  B. Prostate Cancer Screening: PSA normal 12/18/13.   C. Colorectal Cancer Screening: Done  01/11/2012   D.Lung Cancer Screening:   His mother died at age 25 secondary to Lung Cancer (Dx  at age 60)--Pt reports she was a heavy  smoker. He quit smoking in year 2000.   I discussed obtaining a screening chest x-ray and he is agreeable. Order placed for chest x-ray--He had this done --Normal.   E.Immunizations:  Tdap 11/01/2011  F/U OV 6 weeks, sooner if needed.  [address], Mary Beth AvondaleDixon, [place], [facility] 02/02/2014 7:41 PM

## 2014-02-03 LAB — HEPATIC FUNCTION PANEL
ALBUMIN: 4.7 g/dL (ref 3.5–5.2)
ALT: 80 U/L — AB (ref 0–53)
AST: 75 U/L — AB (ref 0–37)
Alkaline Phosphatase: 97 U/L (ref 39–117)
BILIRUBIN TOTAL: 0.7 mg/dL (ref 0.2–1.2)
Bilirubin, Direct: 0.2 mg/dL (ref 0.0–0.3)
Indirect Bilirubin: 0.5 mg/dL (ref 0.2–1.2)
TOTAL PROTEIN: 7.4 g/dL (ref 6.0–8.3)

## 2014-02-04 ENCOUNTER — Ambulatory Visit: Payer: Managed Care, Other (non HMO) | Admitting: Physician Assistant

## 2014-03-09 ENCOUNTER — Ambulatory Visit (INDEPENDENT_AMBULATORY_CARE_PROVIDER_SITE_OTHER): Payer: Managed Care, Other (non HMO) | Admitting: Physician Assistant

## 2014-03-09 ENCOUNTER — Encounter: Payer: Self-pay | Admitting: Physician Assistant

## 2014-03-09 VITALS — BP 130/80 | HR 68 | Temp 98.3°F | Resp 18 | Wt 191.0 lb

## 2014-03-09 DIAGNOSIS — R7989 Other specified abnormal findings of blood chemistry: Secondary | ICD-10-CM

## 2014-03-09 DIAGNOSIS — R945 Abnormal results of liver function studies: Secondary | ICD-10-CM

## 2014-03-09 DIAGNOSIS — E785 Hyperlipidemia, unspecified: Secondary | ICD-10-CM

## 2014-03-09 DIAGNOSIS — F411 Generalized anxiety disorder: Secondary | ICD-10-CM

## 2014-03-09 LAB — HEPATIC FUNCTION PANEL
ALBUMIN: 4.8 g/dL (ref 3.5–5.2)
ALK PHOS: 115 U/L (ref 39–117)
ALT: 90 U/L — ABNORMAL HIGH (ref 0–53)
AST: 87 U/L — ABNORMAL HIGH (ref 0–37)
Bilirubin, Direct: 0.2 mg/dL (ref 0.0–0.3)
Indirect Bilirubin: 0.6 mg/dL (ref 0.2–1.2)
TOTAL PROTEIN: 7.9 g/dL (ref 6.0–8.3)
Total Bilirubin: 0.8 mg/dL (ref 0.2–1.2)

## 2014-03-09 LAB — HEPATITIS PANEL, ACUTE
HCV Ab: NEGATIVE
HEP B C IGM: NONREACTIVE
HEP B S AG: NEGATIVE
Hep A IgM: NONREACTIVE

## 2014-03-09 MED ORDER — CLONAZEPAM 0.5 MG PO TABS: 0.5000 mg | ORAL_TABLET | Freq: Two times a day (BID) | ORAL | Status: DC | PRN

## 2014-03-09 MED ORDER — AMLODIPINE BESYLATE 10 MG PO TABS: ORAL_TABLET | ORAL | Status: DC

## 2014-03-09 NOTE — Progress Notes (Signed)
Patient ID: Nathan RuffingBrian Roberts MRN: [id number], DOB: 04/08/1961, 53 y.o. Date of Encounter: @DATE @  Chief Complaint:  Chief Complaint  Patient presents with  . Stress    job related/health related    HPI: 53 y.o. year old white male  Presents for f/u OV.   He was seen by me 12/18/13  as a new patient to establish care.  As well, he had specific complaints, and also, wanted to do CPE.  I did not code that OV as a CPE but went ahead and did the fasting labs that day.    At that initial OV he reported that he had just been going to an urgent care-- said he had never established a PCP.  AT INITIAL OV 12/18/13  WE DISCUSSED:  HTN:   At initial OV 12/18/13 he reported that he had been told to take Norvasc and Cozaar. Stated that the Cozaar ran out so he stayed off of it. Says he had started going to the gym and his blood pressure had decreased some so  he thought he could just stay off of it.  I asked him about the Gabepentin/Neurontin on his medicine list. He says that this was prescribed for migraines but it did not help. Says he only took it for 1 to 2 weeks. Remove this from his medicine list today. Says he is no longer having migraines.  Also asked about the Nasonex. He says it has run out. He does use it when he has problems with his allergies. Says anytime he is around grass or weeds he has allergy symptoms and does need a refill on this.  Asked about tramadol. He says this is for neck and shoulder pain which he has secondary to his work. Says that he bends over working on machinery and this causes a lot of pain in his neck and shoulders. Says that this tramadol has run out.  GERD: Reports he has no problems with this. Uses over-the-counter medicines rarely.  Anxiety: Has been separated for 5 years. At that time his wife started staying with her mom in [place] and has been back-and-forth between the 2 areas. Last summer they went to do the paperwork for divorce. She had no  insurance-- that is why they have stayed legally married-- so she can stay on his insurance.  When she went to obtain insurance for herself, she found out she had colon cancer. He states that she took chemotherapy for 6 weeks then they removed the tumor. The resection was done in January. Since then, he has been somewhat confused about the situation--says  at one point they had said she needed 6 more weeks of chemotherapy after the tumor was resected. However now they have said she needs chemotherapy for 7 months. Says that she just started this 7 month chemo yesterday. She is receiving this care in [place] with her mom.  He has one child-- a son, age 10424. He went to college in [place] and graduated last year. He has his own apartment-- still in [place].  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 Says he's been with this particular company for 6 years but has done this type of work his entire life.  Says for the past month his mind has been racing. Says it is hard for him to relax and focus on the moment -- even at work he cannot focus on his work because his mind is racing with other  thoughts. States that people at work have said things to him about this and he is afraid he will lose his job. Regular work hours  Supposed to be 7 AM to 3 PM. However he has been working 4 AM to 3 PM with a lot of overtime. Says this has been off and on for the past 2 years but 75% of the time of the past 2 years he has been working these hours. Also says that sometimes he works 7 days per week with 11-12 hours per day. Says that it has been these hours a lot over the past 2 years. Says that this has been a little better over the last 6 months. Says that "normal " supposed to be 5 days a week.  Says his goal is to be in bed at 7 PM but sometimes it's 8 or 8:30. Says that he often tosses and turns. However says even if he is able to fall asleep-- he will wake up  after just an hour. Says this difficulty sleeping started around October.  I told patient that I saw some mention of anxiety and insomnia in Epic. He says that was in 24-Jul-2013when his father died. Says that he did not use any prescription medicines at that time but had mentioned it to his medical provider.  He lives in [place] but works near [place].  AT THE 12/18/2013 OV, I HAD THE FOLLOWING ASSESSMENT/PLAN:  1. Hypertension  Uncontrolled. Cont Norvasc. Add Cozaar (he used this in past with no adv effects.Not currently taking--see HPI)  RTC 2 weeks to receck BP and BMET  - losartan (COZAAR) 50 MG tablet; Take 1 tablet (50 mg total) by mouth daily. Dispense: 30 tablet; Refill: 1  - COMPLETE METABOLIC PANEL WITH GFR  2. Hyperlipidemia  I AM NOT SURE IF HE IS TAKING LIPITOR--I DONT THINK SO--WILL CLARIFY THIS AT F/U OV IN 2 WEEKS  - COMPLETE METABOLIC PANEL WITH GFR  - Lipid panel  3. Insomnia  Will Rx Klonopin. Told him if he sees no effect 30 minutes after taking this, then he can take a second dose.  - clonazePAM (KLONOPIN) 0.5 MG tablet; Take 1 tablet (0.5 mg total) by mouth at bedtime. Dispense: 30 tablet; Refill: 0  4. Generalized anxiety disorder  Discussed, at length, proper expectations and use of these medications.  He will have a followup office visit 6 weeks from now to follow up the effectiveness of the Zoloft.  - sertraline (ZOLOFT) 50 MG tablet; Take 1 tablet (50 mg total) by mouth daily. Dispense: 30 tablet; Refill: 3  - clonazePAM (KLONOPIN) 0.5 MG tablet; Take 1 tablet (0.5 mg total) by mouth at bedtime. Dispense: 30 tablet; Refill: 0  5. Allergic rhinitis  - mometasone (NASONEX) 50 MCG/ACT nasal spray; 2 spray each nostril qd Dispense: 17 g; Refill: 3  6. GERD (gastroesophageal reflux disease)  Rarely has symptoms of this. Controlled with OTC meds -rarely needed  7. Visit for preventive health examination  He wants to have CPE in near future. Is fasting now.    Will obtain labs now and will complete exam and preventive care at follow up visits.  He will be needing to have f/u in 2 weeks for HTN and 6 weeks for his anxiety--can code one of those as CPE--Today coded New 4.  - CBC with Differential  - COMPLETE METABOLIC PANEL WITH GFR  - Lipid panel  - PSA  - TSH  - Vit D 25 hydroxy (  rtn osteoporosis monitoring)  8. Prostate cancer screening  Check PSA now.  9. His mother died at age 77 secondary to Lung Cancer (Dx at age 77)--Pt reports she was a heavy smoker. He quit smoking in year 2000. Will discuss screening CXR at f/u.      AT OV 01/07/2014 HE REPORTED:  States that he did add the Cozaar as directed. He is taking this daily and is having no adverse effects. He is taking the Zoloft daily. He is having no adverse effects with this. He has been taking the Klonopin at night but it is not working well for the insomnia. He says that only on the weekends when he does not have to wake up to work the next morning he has tried taking 2 pills at a time. Says even taking 2 at a time does not work very well. He says this medicine helps him fall asleep but then he still wakes up after just 2 or 2-1/2 hours. Says in the past he has used Ambien CR and it worked well for him. Says that the plain Ambien did not work.  AT THE 01/07/14 OV MY ASSESSMENT/PLAN WAS:  1. Elevated LFTs  12/18/13 we did screening labs. LFTs were elevated with alkaline phosphatase 143, AST 104, ALT 108.  He should states that he was taking Lipitor daily prior to that time.  At that time we put the Lipitor on hold. He says he has not taken it since that time.  Recheck LFTs today.  He says that he takes no Tylenol.  Says over the course of an entire weekend he will drink a 12 pack of beer. During the week drinks no alcohol. On the weekend he drinks no other types of alcohol to include any liquor or wine.  2. Hypertension  Controlled. Cont Norvasc. Cont Cozaar.  Recheck BMET on Cozaar.  (added at LOV)  3. Hyperlipidemia  See # 1 -- Lipitor on hold secondary to elevated LFTs.  4. Insomnia  Ambien CR 12.5 mg --prescription printed for #30+2 additional refills  5. Generalized anxiety disorder  At LOV--Discussed, at length, proper expectations and use of these medications.  He will have a followup office visit in 4 weeks (6 weeks from time Zoloft started) to follow up the effectiveness of the Zoloft.  6. Allergic rhinitis  - mometasone (NASONEX) 50 MCG/ACT nasal spray; 2 spray each nostril qd Dispense: 17 g; Refill: 3  7. GERD (gastroesophageal reflux disease)  Rarely has symptoms of this. Controlled with OTC meds -rarely needed  8. Visit for preventive health examination  At his OV 12/18/13 he was wanting to do a complete physical exam but there were too many issues to address. He was fasting at that time so we went ahead and obtained fasting labs but coded that visit as a New Level 4. TODAY, CODED THIS OV AS CPE.  A. Screening Labs--were drawn 12/18/13-- all were normal except for elevated LFTs  - CBC with Differential  - COMPLETE METABOLIC PANEL WITH GFR  - Lipid panel  - PSA  - TSH  - Vit D 25 hydroxy (rtn osteoporosis monitoring)  B. Prostate Cancer Screening: PSA normal 12/18/13.  C. Colorectal Cancer Screening: Done 01/11/2012  D.Lung Cancer Screening:  His mother died at age 33 secondary to Lung Cancer (Dx at age 77)--Pt reports she was a heavy smoker. He quit smoking in year 2000.  Today I discussed obtaining a screening chest x-ray and he is agreeable.  Order placed for  chest x-ray  E.Immunizations:  Tdap 11/01/2011  He will followup for office visit 4 weeks from now.  In The interim I will follow up results of the BMET and LFTs and also followup results of chest x-ray.  THIS OV CODED AS CPE.    At OV--02/13/14-- HE REPORTED:  He reports that he can tell some decrease in his anxiety since starting the Zoloft but says that he still feels anxious. Reports no  adverse effects.  In regards to his elevated LFTs, Lipitor is on hold. He says that he has not decreased alcohol as much as he should. Says that he actually is on vacation from work for 2 weeks and has been off this past week as well as the upcoming week. Says that he has been spending time at the pool and drinking alcohol there.  AT OV 02-13-2014--THE ASSESSMENT/PLAN WAS: Zoloft increased from 50mg  to 100mg .   TODAY--03/09/2014: Today he reports that he has had a lot of stress at work that he had not told me about at prior visits. He says that for the past 2 years his supervisor has been threatening to get rid of him and replace him. Says that his supervisor is constantly on him. He says that his supervisor will walk right by everybody else and doesn't say a word. Patient states that at the station/area where he works,- that in the past 5 people were working that station and currently they only have 3 people working there. He has to run back and forth to cover the two ends of the table. However whenever his supervisor sees him he tells him to just stay in one spot. Patient states that this is impossible. There is no way to run the equipment they have if he just stands in one spot. Says this stress has really increased to where it has really been bothering him in the past one month. Now he can feel his stomach rumbling. Feels chest discomfort secondary to anxiety/stress.  Says he gets along great with all the coworkers. If for some reason his machine is down, then he pitches in and helps other people with their work. He says that his company is trying to run things differently but that it is impossible to run the equipment they have the way they are telling him to do it. He says by the time he left work on the last day he worked, he felt like he was slamming things around and getting extremely angry. He says that he does not want to be this way and does not want to return to work feeling this much anger. He  is requesting that I write him out of work so that he can get himself calmed down and also so that he can schedule an appointment time to meet with HR and his supervisor etc. He says he has a typed copy of the duties that were listed when he was hired there. He says that he is not doing any of those duties and his current job is completely different than what he was initially hired to do. He says that in the past he had looked at other jobs and knows that he can get another job if it comes to that. As well he says that he has sat down and written out his points that he wants to do have discussed with at the meeting. He says that he does plan to schedule this meeting for this week and take his papers that he has so  he does not get side tracked and so he does not get so emotionally upset and to just present his information and get their feedback. He is wanting some time off work so that he can get himself calmed down before and after this meeting. Also is needing some medication to help calm him down.   Past Medical History  Diagnosis Date  . Allergy   . GERD (gastroesophageal reflux disease)   . Carpal tunnel syndrome   . Hypertension   . Arthritis   . Hyperlipidemia      Home Meds: Outpatient Prescriptions Prior to Visit  Medication Sig Dispense Refill  . losartan (COZAAR) 100 MG tablet Take 1 tablet (100 mg total) by mouth daily.  30 tablet  3  . mometasone (NASONEX) 50 MCG/ACT nasal spray 2 spray each nostril qd  17 g  3  . Multiple Vitamin (MULTIVITAMIN WITH MINERALS) TABS Take 1 tablet by mouth daily.      . sertraline (ZOLOFT) 100 MG tablet Take 1 tablet (100 mg total) by mouth daily.  30 tablet  3  . sildenafil (VIAGRA) 50 MG tablet Take 50 mg by mouth as needed.      . zolpidem (AMBIEN CR) 12.5 MG CR tablet Take 1 tablet (12.5 mg total) by mouth at bedtime as needed for sleep.  30 tablet  2  . amLODipine (NORVASC) 10 MG tablet take 1 tablet by mouth once daily  90 tablet  1  .  clonazePAM (KLONOPIN) 0.5 MG tablet Take 1 tablet (0.5 mg total) by mouth at bedtime.  30 tablet  0  . atorvastatin (LIPITOR) 40 MG tablet take 1 tablet by mouth once daily  90 tablet  0   No facility-administered medications prior to visit.    Allergies:  Allergies  Allergen Reactions  . Latex Hives    History   Social History  . Marital Status: Married    Spouse Name: N/A    Number of Children: N/A  . Years of Education: N/A   Occupational History  . Not on file.   Social History Main Topics  . Smoking status: Former Smoker    Types: Cigarettes    Quit date: 12/08/1996  . Smokeless tobacco: Never Used  . Alcohol Use: 5.3 oz/week    2 Glasses of wine, 5 Cans of beer, 1 Shots of liquor, 1 Drinks containing 0.5 oz of alcohol per week  . Drug Use: No  . Sexual Activity: Not Currently   Other Topics Concern  . Not on file   Social History Narrative  . No narrative on file    Family History  Problem Relation Age of Onset  . Cancer Mother 19    lung--smoker  . Early death Mother   . Atrial fibrillation Father   . Heart disease Father   . COPD Father   . Hyperlipidemia Father   . Hypertension Father   . Colon cancer Neg Hx   . Esophageal cancer Neg Hx   . Stomach cancer Neg Hx   . Rectal cancer Neg Hx      Review of Systems:  See HPI for pertinent ROS. All other ROS negative.    Physical Exam: Blood pressure 130/80, pulse 68, temperature 98.3 F (36.8 C), temperature source Oral, resp. rate 18, weight 191 lb (86.637 kg)., Body mass index is 29.91 kg/(m^2). General: WNWD WM. Appears in no acute distress. Neck: Supple. No thyromegaly. No lymphadenopathy. No carotid bruits.  Lungs: Clear bilaterally to auscultation without  wheezes, rales, or rhonchi. Breathing is unlabored. Heart: RRR with S1 S2. No murmurs, rubs, or gallops. Musculoskeletal:  Strength and tone normal for age. Extremities/Skin: Warm and dry. No edema.  Neuro: Alert and oriented X 3. Moves  all extremities spontaneously. Gait is normal. CNII-XII grossly in tact. Psych: He seems somewhat stressed and anxious, but overall mood and affect very appropriate and normal.  Responds to questions appropriately .     ASSESSMENT AND PLAN:  53 y.o. year old male with   1. Elevated LFTs 12/18/13 we did screening labs. LFTs were elevated with alkaline phosphatase 143, AST 104, ALT 108. He states that he was taking Lipitor daily prior to that time. At that time we put the Lipitor on hold. He says he has not taken it since that time.  At the time the elevated LFTs were diagnosed, he said  that he takes no Tylenol. At that time he said that over the course of an entire weekend he will drink a 12 pack of beer. During the week drinks no alcohol. On the weekend he drinks no other types of alcohol to include any liquor or wine. Today-02/02/14- says he is on vacation from work for 2 weeks and has been sitting at the pool drinking some alcohol there.  F/U LFTs were decreased but not back to normal.   At office visit 03/09/14 he tells me that over the past one week he has drank one case of beer which is 24 bottles. Recheck LFTs now and check Hepatitis Panel.   2. Anxiety/Stress: At Office visit 03/09/14 I printed a prescription for Klonopin 0.5 mg 1 by mouth twice a day when necessary #60 with one refill. Discussed with patient at length proper use of this medication. Told him that after he takes one pil--l that if in 30 minutes he has not seen any effect-- then he can take a second pill for a 1 mg dose. I also told him not to take this medication prior to driving or operating machinery and cautioned him that it may make him feel "loopy". Also he is not to take the medication with his Ambien or with any alcohol.  Today I have given him a letter that states that he is to be out of work 03/09/14 through 03/15/14. He already has a routine office visit scheduled with me for 03/18/14. He will keep that  appointment and followup at that time or follow up sooner if needed.     THE FOLLOWING IS COPIED FROM THE 02/02/2014 OV NOTE: 2. Hypertension Controlled. Cont Norvasc. Cont Cozaar. BMET checked 01/07/14.  3. Hyperlipidemia See # 1 -- Lipitor on hold secondary to elevated LFTs.   4. Insomnia Ambien CR 12.5 mg  --prescription printed for #30+2 additional refills at OV 01/07/14.  Cont this. Patient states that this is working well. Says that he is able to get 5-6 hours of sleep per night with Ambien CR which is much better than the past when he was only getting about 3 hours of sleep.  5. Generalized anxiety disorder Improved, but not well controlled. Increase Zoloft from 50 mg to 100 mg. Schedule f/u OV 6 weeks.   6. Allergic rhinitis - mometasone (NASONEX) 50 MCG/ACT nasal spray; 2 spray each nostril qd  Dispense: 17 g; Refill: 3  7. GERD (gastroesophageal reflux disease) Rarely has symptoms of this. Controlled with OTC meds -rarely needed  8. Visit for preventive health examination At his OV 12/18/13 he was wanting to do a  complete physical exam but there were too many issues to address. He was fasting at that time so we went ahead and obtained fasting labs but coded that visit as a New Level 4.  Coded OV on 01/24/2014 as CPE.  A. Screening Labs--were drawn 12/18/13-- all were normal except for elevated LFTs - CBC with Differential - COMPLETE METABOLIC PANEL WITH GFR - Lipid panel - PSA - TSH - Vit D  25 hydroxy (rtn osteoporosis monitoring)  B. Prostate Cancer Screening: PSA normal 12/18/13.   C. Colorectal Cancer Screening: Done  01/11/2012   D.Lung Cancer Screening:   His mother died at age 50 secondary to Lung Cancer (Dx at age 26)--Pt reports she was a heavy smoker. He quit smoking in year 2000.   I discussed obtaining a screening chest x-ray and he is agreeable. Order placed for chest x-ray--He had this done --Normal.   E.Immunizations:  Tdap 11/01/2011  F/U OV 6 weeks,  sooner if needed.  Signed, [address] [place], [place], [facility] 03/09/2014 11:23 AM

## 2014-03-12 ENCOUNTER — Telehealth: Payer: Self-pay | Admitting: Physician Assistant

## 2014-03-12 NOTE — Telephone Encounter (Signed)
Patient calling us about lab results  [phone]

## 2014-03-12 NOTE — Telephone Encounter (Signed)
LMTRC.Marland Kitchen.03/12/14/ss

## 2014-03-13 ENCOUNTER — Other Ambulatory Visit: Payer: Self-pay | Admitting: Family Medicine

## 2014-03-13 DIAGNOSIS — R945 Abnormal results of liver function studies: Principal | ICD-10-CM

## 2014-03-13 DIAGNOSIS — R7989 Other specified abnormal findings of blood chemistry: Secondary | ICD-10-CM

## 2014-03-16 ENCOUNTER — Ambulatory Visit: Payer: Managed Care, Other (non HMO) | Admitting: Physician Assistant

## 2014-03-18 ENCOUNTER — Ambulatory Visit: Payer: Managed Care, Other (non HMO) | Admitting: Physician Assistant

## 2014-03-19 ENCOUNTER — Other Ambulatory Visit: Payer: Managed Care, Other (non HMO)

## 2014-07-06 ENCOUNTER — Telehealth: Payer: Self-pay | Admitting: Family Medicine

## 2014-07-06 DIAGNOSIS — G4700 Insomnia, unspecified: Secondary | ICD-10-CM

## 2014-07-06 NOTE — Telephone Encounter (Signed)
Pharmacy req Rf Zolpidem ER 12.5 mg.   Last RF 01/07/14 #30 + 2.  LOV 03/09/14  OK refill?

## 2014-07-06 NOTE — Telephone Encounter (Signed)
NTBS.  NO RX   until has OV.  LOV 03/09/14--said he had appt scheduled for 03/18/14--was to keep that appt and f/u then.

## 2014-07-07 ENCOUNTER — Encounter: Payer: Self-pay | Admitting: Family Medicine

## 2014-07-07 NOTE — Telephone Encounter (Signed)
Letter to patient to schedule appt.

## 2014-07-07 NOTE — Telephone Encounter (Signed)
Refill denied to pharmacy per provider

## 2014-07-20 ENCOUNTER — Telehealth: Payer: Self-pay | Admitting: Family Medicine

## 2014-07-20 DIAGNOSIS — G4700 Insomnia, unspecified: Secondary | ICD-10-CM

## 2014-07-20 NOTE — Telephone Encounter (Signed)
LRF 6/10 #30 + 2.  LOV 03/09/14.  Was suppose to follow up and has not!  OK refill?

## 2014-07-20 NOTE — Telephone Encounter (Signed)
Returned denied to pharmacy 

## 2014-07-20 NOTE — Telephone Encounter (Signed)
MUST come in for OV. DENIED.

## 2014-07-28 ENCOUNTER — Telehealth: Payer: Self-pay | Admitting: Family Medicine

## 2014-07-28 DIAGNOSIS — G4700 Insomnia, unspecified: Secondary | ICD-10-CM

## 2014-07-28 NOTE — Telephone Encounter (Signed)
Zolpidem denied again per provider.  Patient NTBS, has been sent letter, has not made appt

## 2014-09-08 ENCOUNTER — Ambulatory Visit (INDEPENDENT_AMBULATORY_CARE_PROVIDER_SITE_OTHER): Payer: Managed Care, Other (non HMO) | Admitting: Physician Assistant

## 2014-09-08 VITALS — BP 176/91 | HR 85 | Temp 98.7°F | Resp 17 | Ht 68.5 in | Wt 206.0 lb

## 2014-09-08 DIAGNOSIS — B9789 Other viral agents as the cause of diseases classified elsewhere: Principal | ICD-10-CM

## 2014-09-08 DIAGNOSIS — J069 Acute upper respiratory infection, unspecified: Secondary | ICD-10-CM

## 2014-09-08 MED ORDER — GUAIFENESIN ER 1200 MG PO TB12: 1.0000 | ORAL_TABLET | Freq: Two times a day (BID) | ORAL | Status: AC

## 2014-09-08 MED ORDER — HYDROCOD POLST-CHLORPHEN POLST 10-8 MG/5ML PO LQCR: 5.0000 mL | Freq: Two times a day (BID) | ORAL | Status: DC | PRN

## 2014-09-08 NOTE — Progress Notes (Signed)
   Subjective:    Patient ID: Nathan RuffingBrian Warzecha, male    DOB: [birth year], 54 y.o.   MRN: [id number]  HPI Sick for the last 5 days.  He has congestion and did have a drippy nose but now he is just congested.   He has a cough that is keeping him up at night but it is dry and he has no associated due to the congestion is his nose.   OTC meds - motrin Sick contacts - work  Review of Systems  Constitutional: Positive for chills. Negative for fever.  HENT: Positive for congestion, postnasal drip and rhinorrhea (clear). Negative for sore throat.   Respiratory: Positive for cough (dry). Negative for shortness of breath and wheezing.   Gastrointestinal: Negative for nausea, vomiting and diarrhea.  Musculoskeletal: Positive for myalgias.  Neurological: Negative for headaches.       Objective:   Physical Exam  Constitutional: He is oriented to person, place, and time. He appears well-developed and well-nourished.  BP 176/91 mmHg  Pulse 85  Temp(Src) 98.7 F (37.1 C) (Oral)  Resp 17  Ht 5' 8.5" (1.74 m)  Wt 206 lb (93.441 kg)  BMI 30.86 kg/m2  SpO2 97%   HENT:  Head: Normocephalic and atraumatic.  Right Ear: External ear normal.  Left Ear: External ear normal.  Eyes: Conjunctivae are normal.  Neck: Normal range of motion.  Cardiovascular: Normal rate, regular rhythm and normal heart sounds.   No murmur heard. Pulmonary/Chest: Effort normal and breath sounds normal. He has no wheezes.  Neurological: He is alert and oriented to person, place, and time.  Skin: Skin is warm and dry.  Psychiatric: He has a normal mood and affect. His behavior is normal. Judgment and thought content normal.       Assessment & Plan:  Viral URI with cough - Plan: Guaifenesin (MUCINEX MAXIMUM STRENGTH) 1200 MG TB12, chlorpheniramine-HYDROcodone (TUSSIONEX PENNKINETIC ER) 10-8 MG/5ML Mare FerrariLQCR  Benny LennertSarah Janaiya Beauchesne PA-C  [facility] 09/08/2014 6:17 PM

## 2014-09-08 NOTE — Patient Instructions (Signed)
Please push fluids.  Tylenol and Motrin for fever and body aches.    

## 2014-11-03 ENCOUNTER — Other Ambulatory Visit: Payer: Self-pay | Admitting: Physician Assistant

## 2014-11-03 NOTE — Telephone Encounter (Signed)
Medication refill for one time only.  Patient needs to be seen.  Letter sent for patient to call and schedule 

## 2014-12-07 ENCOUNTER — Encounter: Payer: Self-pay | Admitting: Family Medicine

## 2014-12-07 ENCOUNTER — Other Ambulatory Visit: Payer: Self-pay | Admitting: Physician Assistant

## 2015-01-10 ENCOUNTER — Other Ambulatory Visit: Payer: Self-pay | Admitting: Physician Assistant

## 2015-01-18 ENCOUNTER — Telehealth: Payer: Self-pay | Admitting: *Deleted

## 2015-01-18 NOTE — Telephone Encounter (Signed)
Pt states another foot doctor referred him to our office.

## 2015-02-19 ENCOUNTER — Ambulatory Visit: Payer: Managed Care, Other (non HMO) | Admitting: Podiatry

## 2015-11-17 DIAGNOSIS — R7989 Other specified abnormal findings of blood chemistry: Secondary | ICD-10-CM | POA: Diagnosis not present

## 2015-11-17 DIAGNOSIS — I10 Essential (primary) hypertension: Secondary | ICD-10-CM | POA: Diagnosis not present

## 2015-11-17 DIAGNOSIS — E789 Disorder of lipoprotein metabolism, unspecified: Secondary | ICD-10-CM | POA: Diagnosis not present

## 2015-11-17 DIAGNOSIS — R799 Abnormal finding of blood chemistry, unspecified: Secondary | ICD-10-CM | POA: Diagnosis not present

## 2016-06-04 DIAGNOSIS — E559 Vitamin D deficiency, unspecified: Secondary | ICD-10-CM | POA: Diagnosis not present

## 2016-06-04 DIAGNOSIS — R5383 Other fatigue: Secondary | ICD-10-CM | POA: Diagnosis not present

## 2016-06-04 DIAGNOSIS — Z0000 Encounter for general adult medical examination without abnormal findings: Secondary | ICD-10-CM | POA: Diagnosis not present

## 2016-06-04 DIAGNOSIS — Z114 Encounter for screening for human immunodeficiency virus [HIV]: Secondary | ICD-10-CM | POA: Diagnosis not present

## 2016-06-04 DIAGNOSIS — I10 Essential (primary) hypertension: Secondary | ICD-10-CM | POA: Diagnosis not present

## 2016-06-21 DIAGNOSIS — R7309 Other abnormal glucose: Secondary | ICD-10-CM | POA: Diagnosis not present

## 2016-06-21 DIAGNOSIS — E7889 Other lipoprotein metabolism disorders: Secondary | ICD-10-CM | POA: Diagnosis not present

## 2016-07-17 DIAGNOSIS — R3129 Other microscopic hematuria: Secondary | ICD-10-CM | POA: Diagnosis not present

## 2016-07-17 DIAGNOSIS — E119 Type 2 diabetes mellitus without complications: Secondary | ICD-10-CM | POA: Diagnosis not present

## 2016-07-17 DIAGNOSIS — I10 Essential (primary) hypertension: Secondary | ICD-10-CM | POA: Diagnosis not present

## 2016-07-17 DIAGNOSIS — M79672 Pain in left foot: Secondary | ICD-10-CM | POA: Diagnosis not present

## 2016-07-27 DIAGNOSIS — R3129 Other microscopic hematuria: Secondary | ICD-10-CM | POA: Diagnosis not present

## 2016-07-28 DIAGNOSIS — M79605 Pain in left leg: Secondary | ICD-10-CM | POA: Diagnosis not present

## 2016-07-28 DIAGNOSIS — M79604 Pain in right leg: Secondary | ICD-10-CM | POA: Diagnosis not present

## 2016-07-28 DIAGNOSIS — M722 Plantar fascial fibromatosis: Secondary | ICD-10-CM | POA: Diagnosis not present

## 2016-07-28 DIAGNOSIS — E118 Type 2 diabetes mellitus with unspecified complications: Secondary | ICD-10-CM | POA: Diagnosis not present

## 2016-08-04 DIAGNOSIS — R600 Localized edema: Secondary | ICD-10-CM | POA: Diagnosis not present

## 2016-08-04 DIAGNOSIS — M722 Plantar fascial fibromatosis: Secondary | ICD-10-CM | POA: Diagnosis not present

## 2016-08-08 DIAGNOSIS — M799 Soft tissue disorder, unspecified: Secondary | ICD-10-CM | POA: Diagnosis not present

## 2016-10-04 DIAGNOSIS — Z79899 Other long term (current) drug therapy: Secondary | ICD-10-CM | POA: Diagnosis not present

## 2016-10-04 DIAGNOSIS — I10 Essential (primary) hypertension: Secondary | ICD-10-CM | POA: Diagnosis not present

## 2016-10-04 DIAGNOSIS — E119 Type 2 diabetes mellitus without complications: Secondary | ICD-10-CM | POA: Diagnosis not present

## 2016-12-07 DIAGNOSIS — M722 Plantar fascial fibromatosis: Secondary | ICD-10-CM | POA: Diagnosis not present

## 2017-02-04 DIAGNOSIS — R05 Cough: Secondary | ICD-10-CM | POA: Diagnosis not present

## 2017-02-18 DIAGNOSIS — I10 Essential (primary) hypertension: Secondary | ICD-10-CM | POA: Diagnosis not present

## 2017-03-13 DIAGNOSIS — M9901 Segmental and somatic dysfunction of cervical region: Secondary | ICD-10-CM | POA: Diagnosis not present

## 2017-03-13 DIAGNOSIS — M5031 Other cervical disc degeneration,  high cervical region: Secondary | ICD-10-CM | POA: Diagnosis not present

## 2017-03-13 DIAGNOSIS — M5412 Radiculopathy, cervical region: Secondary | ICD-10-CM | POA: Diagnosis not present

## 2017-05-24 ENCOUNTER — Ambulatory Visit (INDEPENDENT_AMBULATORY_CARE_PROVIDER_SITE_OTHER): Payer: BLUE CROSS/BLUE SHIELD | Admitting: Family Medicine

## 2017-05-24 ENCOUNTER — Encounter: Payer: Self-pay | Admitting: Family Medicine

## 2017-05-24 VITALS — BP 148/72 | HR 102 | Temp 99.1°F | Resp 17 | Ht 68.5 in | Wt 205.2 lb

## 2017-05-24 DIAGNOSIS — E785 Hyperlipidemia, unspecified: Secondary | ICD-10-CM | POA: Diagnosis not present

## 2017-05-24 DIAGNOSIS — Z5181 Encounter for therapeutic drug level monitoring: Secondary | ICD-10-CM

## 2017-05-24 DIAGNOSIS — G4726 Circadian rhythm sleep disorder, shift work type: Secondary | ICD-10-CM

## 2017-05-24 DIAGNOSIS — I10 Essential (primary) hypertension: Secondary | ICD-10-CM | POA: Diagnosis not present

## 2017-05-24 MED ORDER — LOSARTAN POTASSIUM 100 MG PO TABS: 50.0000 mg | ORAL_TABLET | Freq: Every day | ORAL | 1 refills | Status: DC

## 2017-05-24 MED ORDER — AMLODIPINE BESYLATE 10 MG PO TABS: 10.0000 mg | ORAL_TABLET | Freq: Every day | ORAL | 1 refills | Status: DC

## 2017-05-24 NOTE — Patient Instructions (Addendum)
   IF you received an x-ray today, you will receive an invoice from [facility]. Please contact [facility] at [phone] with questions or concerns regarding your invoice.   IF you received labwork today, you will receive an invoice from [facility]. Please contact [facility] at [phone] with questions or concerns regarding your invoice.   Our billing staff will not be able to assist you with questions regarding bills from these companies.  You will be contacted with the lab results as soon as they are available. The fastest way to get your results is to activate your My Chart account. Instructions are located on the last page of this paperwork. If you have not heard from us regarding the results in 2 weeks, please contact this office.     Heart Disease Prevention Heart disease is a leading cause of death. There are many things you can do to help prevent heart disease. Be physically active Physical activity is good for your heart. It helps control your blood pressure, cholesterol levels, and weight. Try to be physically active every day. Ask your health care provider what activities are best for you. Be a healthy weight Extra weight can strain your heart and affect your blood pressure and cholesterol levels. Lose weight with diet and exercise if recommended by your health care provider. Eat heart-healthy foods Follow a healthy eating plan as recommended by your health care provider or dietitian. Heart-healthy foods include:  High-fiber foods. These include oat bran, oatmeal, and whole-grain breads and cereals.  Fruits and vegetables.  Avoid:  Alcohol.  Fried foods.  Foods high in saturated fat. These include meats, butter, whole dairy products, shortening, and coconut or palm oil.  Salty foods. These include canned food, luncheon meat, salty snacks, and fast food.  Keep your cholesterol levels under control Cholesterol is a substance that is used for many  important functions. When your cholesterol levels are high, cholesterol can stick to the insides of your blood vessels, making them narrow or clog. This can lead to chest pain (angina) and a heart attack. Keep your cholesterol levels under control as recommended by your health care provider. Have your cholesterol checked at least once a year. Target cholesterol levels (in mg/dL) for most people are:  Total cholesterol below 200.  LDL cholesterol below 100.  HDL cholesterol above 40 in men and above 50 in women.  Triglycerides below 150.  Keep your blood pressure under control Having high blood pressure (hypertension) puts you at risk for stroke and other forms of heart disease. Keep your blood pressure under control as recommended by your health care provider. Ask your health care provider if you need treatment to lower your blood pressure. If you are 18-39 years of age, have your blood pressure checked every 3-5 years. If you are 40 years of age or older, have your blood pressure checked every year. Do not use tobacco products Tobacco smoke can damage your heart and blood vessels. Do not use any tobacco products including cigarettes, chewing tobacco, or electronic cigarettes. If you need help quitting, ask your health care provider. Take medicines as directed Take medicines only as directed by your health care provider. Ask your health care provider whether you should take an aspirin every day. Taking aspirin can help reduce your risk of heart disease and stroke. Where to find more information: To find out more about heart disease, visit the American Heart Association's website at www.americanheart.org This information is not intended to replace advice given to   you by your health care provider. Make sure you discuss any questions you have with your health care provider. Document Released: 02/29/2004 Document Revised: 12/15/2015 Document Reviewed: 09/10/2013 Elsevier Interactive Patient  Education  2017 Elsevier Inc.  

## 2017-05-24 NOTE — Progress Notes (Signed)
Chief Complaint  Patient presents with  . Medication Refill    amlodipine, ambien cr, losartan    HPI  Patient is here to get established with a PCP. He is familiar with our practice.  Hypertension: Patient here for follow-up of elevated blood pressure. He is exercising and is adherent to low salt diet.  Blood pressure is well controlled at home. Cardiac symptoms none. Patient denies chest pain, chest pressure/discomfort, claudication, exertional chest pressure/discomfort, fatigue, irregular heart beat and near-syncope.  Cardiovascular risk factors: advanced age (older than 4755 for men, 1565 for women), dyslipidemia and hypertension. Use of agents associated with hypertension: none. History of target organ damage: none. 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 and those were dc'd he has shift work sleep issues when switching from 1st shift to 3rd shift He works in a [employer] company He sleeps about 5 hours in general when transitioning from different shifts He does not consume a lot of alcohol  ROS Review of Systems See HPI Constitution: No fevers or chills No malaise No diaphoresis Skin: No rash or itching Eyes: no blurry vision, no double vision GU: no dysuria or hematuria Neuro: no dizziness or headaches,    Objective: Vitals:   05/24/17 1322  BP: (!) 148/72  Pulse: (!) 102  Resp: 17  Temp: 99.1 F (37.3 C)  TempSrc: Oral  SpO2: 96%  Weight: 205 lb 3.2 oz (93.1 kg)  Height: 5' 8.5" (1.74 m)    Physical  Exam  Constitutional: He is oriented to person, place, and time. He appears well-developed and well-nourished.  HENT:  Head: Normocephalic and atraumatic.  Eyes: Conjunctivae and EOM are normal.  Neck: Normal range of motion. No thyromegaly present.  Cardiovascular: Normal rate, regular rhythm and normal heart sounds.  Exam reveals no friction rub.   No murmur heard. Pulmonary/Chest: Effort normal and breath sounds normal. No respiratory distress. He has no wheezes.  Abdominal: Soft. Bowel sounds are normal. He exhibits no distension and no mass. There is no tenderness. There is no guarding.  Musculoskeletal: Normal range of motion. He exhibits no edema.  Neurological: He is alert and oriented to person, place, and time.  Skin: Skin is warm. Capillary refill takes less than 2 seconds.  Psychiatric: He has a normal mood and affect. His behavior is normal. Judgment and thought content normal.    Assessment and Plan Arlys JohnBrian was seen today for medication refill.  Diagnoses and all orders for this visit:  Encounter for medication monitoring -     Comprehensive metabolic panel -     Microalbumin, urine  Essential hypertension- bp well controlled with 2 meds  Will refill with 3 months supply Will monitor renal function and urine micro -     losartan (COZAAR) 100 MG tablet; Take 0.5 tablets (50 mg total) by mouth daily. -     amLODipine (NORVASC) 10 MG tablet; Take 1 tablet (10 mg total) by mouth daily. 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

## 2017-05-25 LAB — COMPREHENSIVE METABOLIC PANEL
A/G RATIO: 1.1 — AB (ref 1.2–2.2)
ALK PHOS: 166 IU/L — AB (ref 39–117)
ALT: 38 IU/L (ref 0–44)
AST: 66 IU/L — AB (ref 0–40)
Albumin: 4 g/dL (ref 3.5–5.5)
BUN/Creatinine Ratio: 16 (ref 9–20)
BUN: 11 mg/dL (ref 6–24)
Bilirubin Total: 0.8 mg/dL (ref 0.0–1.2)
CO2: 26 mmol/L (ref 20–29)
Calcium: 9.2 mg/dL (ref 8.7–10.2)
Chloride: 102 mmol/L (ref 96–106)
Creatinine, Ser: 0.67 mg/dL — ABNORMAL LOW (ref 0.76–1.27)
GFR calc non Af Amer: 106 mL/min/{1.73_m2} (ref >59)
GFR, EST AFRICAN AMERICAN: 122 mL/min/{1.73_m2} (ref >59)
GLUCOSE: 142 mg/dL — AB (ref 65–99)
Globulin, Total: 3.8 g/dL (ref 1.5–4.5)
POTASSIUM: 4.3 mmol/L (ref 3.5–5.2)
Sodium: 142 mmol/L (ref 134–144)
TOTAL PROTEIN: 7.8 g/dL (ref 6.0–8.5)

## 2017-05-25 LAB — LIPID PANEL
Chol/HDL Ratio: 5.7 ratio — ABNORMAL HIGH (ref 0.0–5.0)
Cholesterol, Total: 182 mg/dL (ref 100–199)
HDL: 32 mg/dL — ABNORMAL LOW (ref >39)
LDL Calculated: 99 mg/dL (ref 0–99)
TRIGLYCERIDES: 256 mg/dL — AB (ref 0–149)
VLDL CHOLESTEROL CAL: 51 mg/dL — AB (ref 5–40)

## 2017-05-25 LAB — MICROALBUMIN, URINE: Microalbumin, Urine: 31.5 ug/mL

## 2017-05-28 DIAGNOSIS — B351 Tinea unguium: Secondary | ICD-10-CM | POA: Diagnosis not present

## 2017-05-28 DIAGNOSIS — E119 Type 2 diabetes mellitus without complications: Secondary | ICD-10-CM | POA: Diagnosis not present

## 2017-05-28 DIAGNOSIS — M79674 Pain in right toe(s): Secondary | ICD-10-CM | POA: Diagnosis not present

## 2017-06-15 DIAGNOSIS — L600 Ingrowing nail: Secondary | ICD-10-CM | POA: Diagnosis not present

## 2017-06-15 DIAGNOSIS — E119 Type 2 diabetes mellitus without complications: Secondary | ICD-10-CM | POA: Diagnosis not present

## 2017-06-15 DIAGNOSIS — B351 Tinea unguium: Secondary | ICD-10-CM | POA: Diagnosis not present

## 2017-06-30 ENCOUNTER — Emergency Department (HOSPITAL_COMMUNITY)
Admission: EM | Admit: 2017-06-30 | Discharge: 2017-06-30 | Disposition: A | Discharge status: Home / Self Care | Payer: BLUE CROSS/BLUE SHIELD | Attending: Emergency Medicine | Admitting: Emergency Medicine

## 2017-06-30 ENCOUNTER — Encounter (HOSPITAL_COMMUNITY): Payer: Self-pay

## 2017-06-30 ENCOUNTER — Other Ambulatory Visit: Payer: Self-pay

## 2017-06-30 DIAGNOSIS — Z91040 Latex allergy status: Secondary | ICD-10-CM | POA: Insufficient documentation

## 2017-06-30 DIAGNOSIS — Z23 Encounter for immunization: Secondary | ICD-10-CM | POA: Insufficient documentation

## 2017-06-30 DIAGNOSIS — L03115 Cellulitis of right lower limb: Secondary | ICD-10-CM | POA: Insufficient documentation

## 2017-06-30 DIAGNOSIS — R609 Edema, unspecified: Secondary | ICD-10-CM | POA: Diagnosis not present

## 2017-06-30 DIAGNOSIS — Z79899 Other long term (current) drug therapy: Secondary | ICD-10-CM | POA: Insufficient documentation

## 2017-06-30 DIAGNOSIS — Z87891 Personal history of nicotine dependence: Secondary | ICD-10-CM | POA: Insufficient documentation

## 2017-06-30 DIAGNOSIS — I10 Essential (primary) hypertension: Secondary | ICD-10-CM | POA: Insufficient documentation

## 2017-06-30 DIAGNOSIS — R21 Rash and other nonspecific skin eruption: Secondary | ICD-10-CM | POA: Diagnosis not present

## 2017-06-30 LAB — CBC WITH DIFFERENTIAL/PLATELET
Basophils Absolute: 0 10*3/uL (ref 0.0–0.1)
Basophils Relative: 1 %
EOS ABS: 0.1 10*3/uL (ref 0.0–0.7)
EOS PCT: 1 %
HCT: 40.2 % (ref 39.0–52.0)
HEMOGLOBIN: 13.8 g/dL (ref 13.0–17.0)
LYMPHS ABS: 2.5 10*3/uL (ref 0.7–4.0)
LYMPHS PCT: 31 %
MCH: 32.9 pg (ref 26.0–34.0)
MCHC: 34.3 g/dL (ref 30.0–36.0)
MCV: 95.9 fL (ref 78.0–100.0)
MONOS PCT: 12 %
Monocytes Absolute: 1 10*3/uL (ref 0.1–1.0)
Neutro Abs: 4.5 10*3/uL (ref 1.7–7.7)
Neutrophils Relative %: 55 %
PLATELETS: 125 10*3/uL — AB (ref 150–400)
RBC: 4.19 MIL/uL — AB (ref 4.22–5.81)
RDW: 13.2 % (ref 11.5–15.5)
WBC: 8.1 10*3/uL (ref 4.0–10.5)

## 2017-06-30 LAB — COMPREHENSIVE METABOLIC PANEL
ALBUMIN: 3.6 g/dL (ref 3.5–5.0)
ALT: 39 U/L (ref 17–63)
AST: 65 U/L — AB (ref 15–41)
Alkaline Phosphatase: 164 U/L — ABNORMAL HIGH (ref 38–126)
Anion gap: 10 (ref 5–15)
BUN: 7 mg/dL (ref 6–20)
CHLORIDE: 100 mmol/L — AB (ref 101–111)
CO2: 23 mmol/L (ref 22–32)
CREATININE: 0.69 mg/dL (ref 0.61–1.24)
Calcium: 8.7 mg/dL — ABNORMAL LOW (ref 8.9–10.3)
GFR calc Af Amer: >60 mL/min (ref >60)
GFR calc non Af Amer: >60 mL/min (ref >60)
Glucose, Bld: 136 mg/dL — ABNORMAL HIGH (ref 65–99)
POTASSIUM: 3.8 mmol/L (ref 3.5–5.1)
SODIUM: 133 mmol/L — AB (ref 135–145)
Total Bilirubin: 1.2 mg/dL (ref 0.3–1.2)
Total Protein: 8.5 g/dL — ABNORMAL HIGH (ref 6.5–8.1)

## 2017-06-30 LAB — I-STAT CG4 LACTIC ACID, ED: LACTIC ACID, VENOUS: 1.42 mmol/L (ref 0.5–1.9)

## 2017-06-30 MED ORDER — DIPHENHYDRAMINE HCL 25 MG PO TABS: 25.0000 mg | ORAL_TABLET | Freq: Four times a day (QID) | ORAL | 0 refills | Status: DC

## 2017-06-30 MED ORDER — DEXAMETHASONE SODIUM PHOSPHATE 10 MG/ML IJ SOLN
10.0000 mg | Freq: Once | INTRAMUSCULAR | Status: AC
  Administered 2017-06-30: 10 mg via INTRAVENOUS
  Filled 2017-06-30: qty 1

## 2017-06-30 MED ORDER — TETANUS-DIPHTH-ACELL PERTUSSIS 5-2.5-18.5 LF-MCG/0.5 IM SUSP
0.5000 mL | Freq: Once | INTRAMUSCULAR | Status: AC
  Administered 2017-06-30: 0.5 mL via INTRAMUSCULAR
  Filled 2017-06-30: qty 0.5

## 2017-06-30 MED ORDER — PREDNISONE 20 MG PO TABS: ORAL_TABLET | ORAL | 0 refills | Status: DC

## 2017-06-30 MED ORDER — CEPHALEXIN 500 MG PO CAPS: 500.0000 mg | ORAL_CAPSULE | Freq: Three times a day (TID) | ORAL | 0 refills | Status: DC

## 2017-06-30 MED ORDER — DIPHENHYDRAMINE HCL 50 MG/ML IJ SOLN
25.0000 mg | Freq: Once | INTRAMUSCULAR | Status: AC
  Administered 2017-06-30: 25 mg via INTRAVENOUS
  Filled 2017-06-30: qty 1

## 2017-06-30 MED ORDER — SODIUM CHLORIDE 0.9 % IV BOLUS (SEPSIS)
1000.0000 mL | Freq: Once | INTRAVENOUS | Status: AC
  Administered 2017-06-30: 1000 mL via INTRAVENOUS

## 2017-06-30 MED ORDER — CEFAZOLIN SODIUM-DEXTROSE 1-4 GM/50ML-% IV SOLN
1.0000 g | Freq: Once | INTRAVENOUS | Status: AC
  Administered 2017-06-30: 1 g via INTRAVENOUS
  Filled 2017-06-30: qty 50

## 2017-06-30 MED ORDER — SULFAMETHOXAZOLE-TRIMETHOPRIM 800-160 MG PO TABS: 1.0000 | ORAL_TABLET | Freq: Two times a day (BID) | ORAL | 0 refills | Status: AC

## 2017-06-30 NOTE — ED Provider Notes (Signed)
[facility] EMERGENCY DEPARTMENT Provider Note   CSN: [id number] Arrival date & time: 06/30/17  1215     History   Chief Complaint Chief Complaint  Patient presents with  . Rash  . Cellulitis    HPI Nathan Roberts is a 56 y.o. male.  HPI   56 year old male with history of arthritis, GERD, hypertension presenting for evaluation of a skin rash.  Patient report 3 weeks ago he developed an itchy rash to his lower extremities that has since spread throughout the body.  Rash is now becoming "annoying" with associated swelling to his right lower extremity with skin weeping.  Rash started shortly after he went to [employer] and use the hot tub.  States that he does use hot tub many times in the past without problem.  Patient has been using over-the-counter barrier cream on his legs without adequate relief.  He also recall removing an ingrown toenails on his right great toe 2 weeks ago and was wondering if this may have worsened his symptoms. sts he was placed on some antibiotic that he took for 10 days.  He denies any significant pain to his rash unless it gets dry and cracks.  He tries to keep barrier cream on it. No fever but endorsed occasional chills.  No headache and neck stiffness, no joint pain, No throat swelling, trouble swallowing, chest pain, shortness of breath, abdominal cramping, nausea vomiting or diarrhea.  Unsure last tetanus status.  Past Medical History:  Diagnosis Date  . Allergy   . Arthritis   . Carpal tunnel syndrome   . GERD (gastroesophageal reflux disease)   . Hyperlipidemia   . Hypertension     Patient Active Problem List   Diagnosis Date Noted  . Elevated LFTs 02/02/2014  . Family history of lung cancer 01/07/2014  . Hyperlipidemia 12/18/2013  . Allergic rhinitis 12/18/2013  . GERD (gastroesophageal reflux disease) 12/18/2013  . Generalized anxiety disorder 12/18/2013  . Prostate cancer screening 12/18/2013  . Chronic sinusitis  02/23/2012  . Persistent headaches 02/23/2012  . Insomnia 02/23/2012  . Cervical disc disease 11/01/2011  . Hypertension 11/01/2011    Past Surgical History:  Procedure Laterality Date  . abscess     rectal area  . CARPAL TUNNEL RELEASE     left  . CERVICAL FUSION    . SPINE SURGERY         Home Medications    Prior to Admission medications   Medication Sig Start Date End Date Taking? Authorizing Provider  amLODipine (NORVASC) 10 MG tablet Take 1 tablet (10 mg total) by mouth daily. 05/24/17   Doristine Bosworth, MD  losartan (COZAAR) 100 MG tablet Take 0.5 tablets (50 mg total) by mouth daily. 05/24/17   Doristine Bosworth, MD  mometasone (NASONEX) 50 MCG/ACT nasal spray 2 spray each nostril qd 12/18/13   Dorena Bodo, PA-C  Multiple Vitamin (MULTIVITAMIN WITH MINERALS) TABS Take 1 tablet by mouth daily.    [provider]  sildenafil (VIAGRA) 50 MG tablet Take 50 mg by mouth as needed. 11/01/11   Roderick Pee, MD    Family History Family History  Problem Relation Age of Onset  . Cancer Mother 65       lung--smoker  . Early death Mother   . Atrial fibrillation Father   . Heart disease Father   . COPD Father   . Hyperlipidemia Father   . Hypertension Father   . Colon cancer Neg Hx   .  Esophageal cancer Neg Hx   . Stomach cancer Neg Hx   . Rectal cancer Neg Hx     Social History Social History   Tobacco Use  . Smoking status: Former Smoker    Types: Cigarettes    Last attempt to quit: 12/08/1996    Years since quitting: 20.5  . Smokeless tobacco: Never Used  Substance Use Topics  . Alcohol use: Yes    Alcohol/week: 5.3 oz    Types: 2 Glasses of wine, 5 Cans of beer, 1 Shots of liquor, 1 Standard drinks or equivalent per week  . Drug use: No     Allergies   Latex   Review of Systems Review of Systems  All other systems reviewed and are negative.    Physical Exam Updated Vital Signs BP (!) 200/97 (BP Location: Right Arm)   Pulse (!) 107    Temp 98.7 F (37.1 C) (Oral)   Resp 16   SpO2 100%   Physical Exam  Constitutional: He appears well-developed and well-nourished. No distress.  HENT:  Head: Atraumatic.  Mouth/Throat: Oropharynx is clear and moist.  No mucosal lesions  Eyes: Conjunctivae are normal.  Neck: Neck supple.  No nuchal rigidity  Cardiovascular:  Tachycardia without M/R/G  Pulmonary/Chest: Effort normal and breath sounds normal.  Abdominal: Soft. Bowel sounds are normal. He exhibits no distension. There is no tenderness.  Neurological: He is alert.  Skin: Rash (intense erythematous skin changes in patches noted throughout chest and abdomen, and along bilateral lower extremities.  Most significant to RLE with bilateral 2+ pitting edema, intact pedal pulses.) noted.  Dry patchy scaly skin changes to dorsum of feet bilaterally.   Psychiatric: He has a normal mood and affect.  Nursing note and vitals reviewed.          ED Treatments / Results  Labs (all labs ordered are listed, but only abnormal results are displayed) Labs Reviewed  COMPREHENSIVE METABOLIC PANEL - Abnormal; Notable for the following components:      Result Value   Sodium 133 (*)    Chloride 100 (*)    Glucose, Bld 136 (*)    Calcium 8.7 (*)    Total Protein 8.5 (*)    AST 65 (*)    Alkaline Phosphatase 164 (*)    All other components within normal limits  CBC WITH DIFFERENTIAL/PLATELET - Abnormal; Notable for the following components:   RBC 4.19 (*)    Platelets 125 (*)    All other components within normal limits  I-STAT CG4 LACTIC ACID, ED    EKG  EKG Interpretation None       Radiology No results found.  Procedures Procedures (including critical care time)  Medications Ordered in ED Medications  Tdap (BOOSTRIX) injection 0.5 mL (0.5 mLs Intramuscular Given 06/30/17 1401)  sodium chloride 0.9 % bolus 1,000 mL (0 mLs Intravenous Stopped 06/30/17 1521)  dexamethasone (DECADRON) injection 10 mg (10 mg  Intravenous Given 06/30/17 1416)  diphenhydrAMINE (BENADRYL) injection 25 mg (25 mg Intravenous Given 06/30/17 1416)  ceFAZolin (ANCEF) IVPB 1 g/50 mL premix (0 g Intravenous Stopped 06/30/17 1446)     Initial Impression / Assessment and Plan / ED Course  I have reviewed the triage vital signs and the nursing notes.  Pertinent labs & imaging results that were available during my care of the patient were reviewed by me and considered in my medical decision making (see chart for details).     BP (!) 168/88   Pulse 93  Temp 98.7 F (37.1 C) (Oral)   Resp 16   SpO2 98%  The patient was noted to be hypertensive today in the emergency department. I have spoken with the patient regarding hypertension and the need for improved management. I instructed the patient to followup with the Primary care doctor within 4 days to improve the management of the patient's hypertension. I also counseled the patient regarding the signs and symptoms which would require an emergent visit to an emergency department for hypertensive urgency and/or hypertensive emergency. The patient understood the need for improved hypertensive management.   Final Clinical Impressions(s) / ED Diagnoses   Final diagnoses:  Rash and nonspecific skin eruption  Cellulitis of right leg    ED Discharge Orders        Ordered    sulfamethoxazole-trimethoprim (BACTRIM DS,SEPTRA DS) 800-160 MG tablet  2 times daily     06/30/17 1537    cephALEXin (KEFLEX) 500 MG capsule  3 times daily     06/30/17 1537    predniSONE (DELTASONE) 20 MG tablet     06/30/17 1537    diphenhydrAMINE (BENADRYL) 25 MG tablet  Every 6 hours     06/30/17 1537     2:10 PM Patient here with a diffuse rash throughout his body most significant to his bilateral lower extremities.  Rash is itchy and painful.  He also has dependent edema involving his lower extremity right greater than left.  Rash extended to the abdomen, chest, and bilateral arms.  No mucosal  involvement, no joint involvement, no fever.  Suspect some form of contact dermatitis versus cellulitis.  He noticed a rash initially when he was using a hot tub at [employer]. Suspect potential hot tub folliculitis.  Patient does have history of hypertension, initially his blood pressure is 200/97.  Patient states he is stressed out due to his  rash and forgot to take his blood pressure medication this morning.  On recheck his blood pressure is 170 systolic.  3:22 PM Fortunately pt's labs are reassuring.  Normal lactic acid, normal WBC, electrolytes value are non concerning.  Pt given decadron, ancef, IVF, benadryl.  Plan to d/c with a course of steroid and abx.  Dermatology referral given.  Recommend return in 2 days for recheck.     Fayrene Helperran, Nicolet Griffy, PA-C 06/30/17 1539    Margarita Grizzleay, Danielle, MD 06/30/17 [phone]

## 2017-06-30 NOTE — ED Notes (Signed)
Patient verbalizes understanding of discharge instructions. Opportunity for questioning and answers were provided. 

## 2017-06-30 NOTE — Discharge Instructions (Signed)
You have been evaluated for a rash.  Please take medications prescribed.  Return in 2 days for recheck if you notice no improvement.  Follow up with dermatologist for further care.

## 2017-06-30 NOTE — ED Triage Notes (Signed)
Pt states he has rash after getting in hot tub at [employer] for several weeks. Pt reports the worst rash is on his leg/ankle area. Area is red, swollen and warm to touch. Rash noted on his arms hands and trunk as well.

## 2017-07-25 ENCOUNTER — Encounter (HOSPITAL_COMMUNITY): Payer: Self-pay | Admitting: Emergency Medicine

## 2017-07-25 ENCOUNTER — Other Ambulatory Visit: Payer: Self-pay

## 2017-07-25 ENCOUNTER — Ambulatory Visit (HOSPITAL_COMMUNITY)
Admission: EM | Admit: 2017-07-25 | Discharge: 2017-07-25 | Disposition: A | Discharge status: Home / Self Care | Payer: BLUE CROSS/BLUE SHIELD | Attending: Family Medicine | Admitting: Family Medicine

## 2017-07-25 DIAGNOSIS — L03115 Cellulitis of right lower limb: Secondary | ICD-10-CM

## 2017-07-25 DIAGNOSIS — L03116 Cellulitis of left lower limb: Secondary | ICD-10-CM

## 2017-07-25 MED ORDER — FUROSEMIDE 40 MG PO TABS: 40.0000 mg | ORAL_TABLET | Freq: Every day | ORAL | 1 refills | Status: DC

## 2017-07-25 MED ORDER — CLINDAMYCIN HCL 150 MG PO CAPS: 150.0000 mg | ORAL_CAPSULE | Freq: Four times a day (QID) | ORAL | 0 refills | Status: DC

## 2017-07-25 MED ORDER — PREDNISONE 20 MG PO TABS
ORAL_TABLET | ORAL | 0 refills | Status: DC
Start: 2017-07-25 — End: 2017-11-30

## 2017-07-25 MED ORDER — MUPIROCIN 2 % EX OINT: 1.0000 "application " | TOPICAL_OINTMENT | Freq: Three times a day (TID) | CUTANEOUS | 2 refills | Status: DC

## 2017-07-25 NOTE — ED Triage Notes (Addendum)
Patient has had back to back issues with this foot.  Patient has had ingrown toenail, seen in ed 12/1 for an infection involving iv's and antibiotics per patient.  Patient has had ingrown toenail infected to putting otc callus medicine on foot followed by getting in a hot tub.  Over the past year patient has used hot tub to relieve foot pain.  Says feet get red, he avoids hot tub and feet get better.  The last 6 weeks, recurrent issues have not gone away  Right foot worse than left foot.  Bilateral swelling, redness, flaking skin.

## 2017-07-25 NOTE — Discharge Instructions (Signed)
Gently wash the feet daily Return on Saturday for recheck Try to keep the legs elevated for as much of the days you can No alcohol

## 2017-07-25 NOTE — ED Provider Notes (Signed)
[facility] CARE CENTER   [id number] 07/25/17 Arrival Time: 0958   SUBJECTIVE:  Nathan Roberts is a 56 y.o. 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 He was initially evaluated by a podiatrist and subsequently the ED.  He has been soaking his feet in a hot tub.  Two different antibiotics have been tried. He admits to drinking alcohol to excess and has been told to cut back  Note from 71/61: 56 year old male with history of arthritis, GERD, hypertension presenting for evaluation of a skin rash.  Patient report 3 weeks ago he developed an itchy rash to his lower extremities that has since spread throughout the body.  Rash is now becoming "annoying" with associated swelling to his right lower extremity with skin weeping.  Rash started shortly after he went to [employer] and use the hot tub.  States that he does use hot tub many times in the past without problem.  Patient has been using over-the-counter barrier cream on his legs without adequate relief.  He also recall removing an ingrown toenails on his right great toe 2 weeks ago and was wondering if this may have worsened his symptoms. sts he was placed on some antibiotic that he took for 10 days.  He denies any significant pain to his rash unless it gets dry and cracks.  He tries to keep barrier cream on it. No fever but endorsed occasional chills.  No headache and neck stiffness, no joint pain, No throat swelling, trouble swallowing, chest pain, shortness of breath, abdominal cramping, nausea vomiting or diarrhea.  Unsure last tetanus status.   Past Medical History:  Diagnosis Date  . Allergy   . Arthritis   . Carpal tunnel syndrome   . GERD (gastroesophageal reflux disease)   . Hyperlipidemia   . Hypertension    Family History  Problem Relation Age of Onset  . Cancer  Mother 47       lung--smoker  . Early death Mother   . Atrial fibrillation Father   . Heart disease Father   . COPD Father   . Hyperlipidemia Father   . Hypertension Father   . Colon cancer Neg Hx   . Esophageal cancer Neg Hx   . Stomach cancer Neg Hx   . Rectal cancer Neg Hx    Social History   Socioeconomic History  . Marital status: Married    Spouse name: Not on file  . Number of children: Not on file  . Years of education: Not on file  . Highest education level: Not on file  Social Needs  . Financial resource strain: Not on file  . Food insecurity - worry: Not on file  . Food insecurity - inability: Not on file  . Transportation needs - medical: Not on file  . Transportation needs - non-medical: Not on file  Occupational History  . Not on file  Tobacco Use  . Smoking status: Former Smoker    Types: Cigarettes    Last attempt to quit: 12/08/1996    Years since quitting: 20.6  . Smokeless tobacco: Never Used  Substance and Sexual Activity  . Alcohol use: Yes    Alcohol/week: 5.3 oz    Types: 2 Glasses of wine, 5 Cans of beer, 1 Shots of liquor, 1 Standard drinks or equivalent per  week  . Drug use: No  . Sexual activity: Not Currently  Other Topics Concern  . Not on file  Social History Narrative  . Not on file   No outpatient medications have been marked as taking for the 07/25/17 encounter [facility](Hospital Encounter).   Allergies  Allergen Reactions  . Latex Hives      ROS: As per HPI, remainder of ROS negative.   OBJECTIVE:   Vitals:   07/25/17 1028  BP: (!) 180/86  Pulse: 72  Resp: 18  Temp: 98.3 F (36.8 C)  TempSrc: Oral  SpO2: 99%     General appearance: alert; no distress Eyes: PERRL; EOMI; conjunctiva normal HENT: normocephalic; atraumatic;  Neck: supple Lungs: clear to auscultation bilaterally Heart: regular rate and rhythm Abdomen: soft, non-tender; bowel sounds normal; fullness RUQ; no guarding or rebound tenderness Back: no CVA  tenderness Extremities: erythematous, edematous, peeling both lower extremities to knees with some rash medially above knees Skin: warm and dry; exfoliating both feet and ankles Neurologic: normal gait; mild bilateral tremor Psychological: alert and cooperative; normal mood and affect      Labs:  Results for orders placed or performed during the hospital encounter of 06/30/17  Comprehensive metabolic panel  Result Value Ref Range   Sodium 133 (L) 135 - 145 mmol/L   Potassium 3.8 3.5 - 5.1 mmol/L   Chloride 100 (L) 101 - 111 mmol/L   CO2 23 22 - 32 mmol/L   Glucose, Bld 136 (H) 65 - 99 mg/dL   BUN 7 6 - 20 mg/dL   Creatinine, Ser 0.980.69 0.61 - 1.24 mg/dL   Calcium 8.7 (L) 8.9 - 10.3 mg/dL   Total Protein 8.5 (H) 6.5 - 8.1 g/dL   Albumin 3.6 3.5 - 5.0 g/dL   AST 65 (H) 15 - 41 U/L   ALT 39 17 - 63 U/L   Alkaline Phosphatase 164 (H) 38 - 126 U/L   Total Bilirubin 1.2 0.3 - 1.2 mg/dL   GFR calc non Af Amer >60 >60 mL/min   GFR calc Af Amer >60 >60 mL/min   Anion gap 10 5 - 15  CBC with Differential  Result Value Ref Range   WBC 8.1 4.0 - 10.5 K/uL   RBC 4.19 (L) 4.22 - 5.81 MIL/uL   Hemoglobin 13.8 13.0 - 17.0 g/dL   HCT 11.940.2 14.739.0 - 82.952.0 %   MCV 95.9 78.0 - 100.0 fL   MCH 32.9 26.0 - 34.0 pg   MCHC 34.3 30.0 - 36.0 g/dL   RDW 56.213.2 13.011.5 - 86.515.5 %   Platelets 125 (L) 150 - 400 K/uL   Neutrophils Relative % 55 %   Neutro Abs 4.5 1.7 - 7.7 K/uL   Lymphocytes Relative 31 %   Lymphs Abs 2.5 0.7 - 4.0 K/uL   Monocytes Relative 12 %   Monocytes Absolute 1.0 0.1 - 1.0 K/uL   Eosinophils Relative 1 %   Eosinophils Absolute 0.1 0.0 - 0.7 K/uL   Basophils Relative 1 %   Basophils Absolute 0.0 0.0 - 0.1 K/uL  I-Stat CG4 Lactic Acid, ED  Result Value Ref Range   Lactic Acid, Venous 1.42 0.5 - 1.9 mmol/L    Labs Reviewed - No data to display  No results found.     ASSESSMENT & PLAN:  I suspect alcohol plays a role here.  1. Cellulitis of leg, right   2. Cellulitis of  leg, left     Meds ordered this encounter  Medications  .  predniSONE (DELTASONE) 20 MG tablet    Sig: Two daily with food    Dispense:  10 tablet    Refill:  0  . furosemide (LASIX) 40 MG tablet    Sig: Take 1 tablet (40 mg total) by mouth daily.    Dispense:  7 tablet    Refill:  1  . clindamycin (CLEOCIN) 150 MG capsule    Sig: Take 1 capsule (150 mg total) by mouth every 6 (six) hours.    Dispense:  28 capsule    Refill:  0  . mupirocin ointment (BACTROBAN) 2 %    Sig: Apply 1 application topically 3 (three) times daily.    Dispense:  30 g    Refill:  2    Reviewed expectations re: course of current medical issues. Questions answered. Outlined signs and symptoms indicating need for more acute intervention. Patient verbalized understanding. After Visit Summary given.    Procedures: no alcohol.      Elvina SidleLauenstein, Tyese Finken, MD 07/25/17 1059

## 2017-08-12 DIAGNOSIS — I10 Essential (primary) hypertension: Secondary | ICD-10-CM | POA: Diagnosis not present

## 2017-08-12 DIAGNOSIS — B353 Tinea pedis: Secondary | ICD-10-CM | POA: Diagnosis not present

## 2017-08-12 DIAGNOSIS — R600 Localized edema: Secondary | ICD-10-CM | POA: Diagnosis not present

## 2017-08-12 DIAGNOSIS — R7309 Other abnormal glucose: Secondary | ICD-10-CM | POA: Diagnosis not present

## 2017-08-16 DIAGNOSIS — B353 Tinea pedis: Secondary | ICD-10-CM | POA: Diagnosis not present

## 2017-11-13 ENCOUNTER — Other Ambulatory Visit: Payer: Self-pay | Admitting: Family Medicine

## 2017-11-13 DIAGNOSIS — I10 Essential (primary) hypertension: Secondary | ICD-10-CM

## 2017-11-27 ENCOUNTER — Emergency Department (HOSPITAL_COMMUNITY): Payer: BLUE CROSS/BLUE SHIELD

## 2017-11-27 ENCOUNTER — Inpatient Hospital Stay (HOSPITAL_COMMUNITY)
Admission: EM | Admit: 2017-11-27 | Discharge: 2017-11-30 | DRG: 603 | Disposition: A | Discharge status: Home / Self Care | Payer: BLUE CROSS/BLUE SHIELD | Attending: Internal Medicine | Admitting: Internal Medicine

## 2017-11-27 ENCOUNTER — Other Ambulatory Visit: Payer: Self-pay

## 2017-11-27 ENCOUNTER — Encounter (HOSPITAL_COMMUNITY): Payer: Self-pay

## 2017-11-27 ENCOUNTER — Emergency Department (HOSPITAL_COMMUNITY)
Admit: 2017-11-27 | Discharge: 2017-11-27 | Disposition: A | Discharge status: Home / Self Care | Payer: BLUE CROSS/BLUE SHIELD | Attending: Emergency Medicine | Admitting: Emergency Medicine

## 2017-11-27 DIAGNOSIS — I872 Venous insufficiency (chronic) (peripheral): Secondary | ICD-10-CM | POA: Diagnosis not present

## 2017-11-27 DIAGNOSIS — Z8349 Family history of other endocrine, nutritional and metabolic diseases: Secondary | ICD-10-CM

## 2017-11-27 DIAGNOSIS — L03115 Cellulitis of right lower limb: Secondary | ICD-10-CM | POA: Diagnosis not present

## 2017-11-27 DIAGNOSIS — R823 Hemoglobinuria: Secondary | ICD-10-CM | POA: Diagnosis not present

## 2017-11-27 DIAGNOSIS — Z91040 Latex allergy status: Secondary | ICD-10-CM | POA: Diagnosis not present

## 2017-11-27 DIAGNOSIS — R21 Rash and other nonspecific skin eruption: Secondary | ICD-10-CM | POA: Diagnosis not present

## 2017-11-27 DIAGNOSIS — Z801 Family history of malignant neoplasm of trachea, bronchus and lung: Secondary | ICD-10-CM | POA: Diagnosis not present

## 2017-11-27 DIAGNOSIS — M79609 Pain in unspecified limb: Secondary | ICD-10-CM

## 2017-11-27 DIAGNOSIS — L280 Lichen simplex chronicus: Secondary | ICD-10-CM | POA: Diagnosis not present

## 2017-11-27 DIAGNOSIS — R7303 Prediabetes: Secondary | ICD-10-CM

## 2017-11-27 DIAGNOSIS — K219 Gastro-esophageal reflux disease without esophagitis: Secondary | ICD-10-CM | POA: Diagnosis present

## 2017-11-27 DIAGNOSIS — Z8249 Family history of ischemic heart disease and other diseases of the circulatory system: Secondary | ICD-10-CM

## 2017-11-27 DIAGNOSIS — L500 Allergic urticaria: Secondary | ICD-10-CM | POA: Diagnosis not present

## 2017-11-27 DIAGNOSIS — R7401 Elevation of levels of liver transaminase levels: Secondary | ICD-10-CM

## 2017-11-27 DIAGNOSIS — Z87891 Personal history of nicotine dependence: Secondary | ICD-10-CM | POA: Diagnosis not present

## 2017-11-27 DIAGNOSIS — Z825 Family history of asthma and other chronic lower respiratory diseases: Secondary | ICD-10-CM

## 2017-11-27 DIAGNOSIS — E785 Hyperlipidemia, unspecified: Secondary | ICD-10-CM | POA: Diagnosis not present

## 2017-11-27 DIAGNOSIS — I739 Peripheral vascular disease, unspecified: Secondary | ICD-10-CM | POA: Diagnosis not present

## 2017-11-27 DIAGNOSIS — M1990 Unspecified osteoarthritis, unspecified site: Secondary | ICD-10-CM | POA: Diagnosis present

## 2017-11-27 DIAGNOSIS — M7989 Other specified soft tissue disorders: Secondary | ICD-10-CM | POA: Diagnosis not present

## 2017-11-27 DIAGNOSIS — Z79899 Other long term (current) drug therapy: Secondary | ICD-10-CM | POA: Diagnosis not present

## 2017-11-27 DIAGNOSIS — Z22322 Carrier or suspected carrier of Methicillin resistant Staphylococcus aureus: Secondary | ICD-10-CM | POA: Diagnosis not present

## 2017-11-27 DIAGNOSIS — F1099 Alcohol use, unspecified with unspecified alcohol-induced disorder: Secondary | ICD-10-CM

## 2017-11-27 DIAGNOSIS — R740 Nonspecific elevation of levels of transaminase and lactic acid dehydrogenase [LDH]: Secondary | ICD-10-CM | POA: Diagnosis present

## 2017-11-27 DIAGNOSIS — Z881 Allergy status to other antibiotic agents status: Secondary | ICD-10-CM

## 2017-11-27 DIAGNOSIS — R3121 Asymptomatic microscopic hematuria: Secondary | ICD-10-CM | POA: Diagnosis not present

## 2017-11-27 DIAGNOSIS — R7309 Other abnormal glucose: Secondary | ICD-10-CM

## 2017-11-27 DIAGNOSIS — I10 Essential (primary) hypertension: Secondary | ICD-10-CM | POA: Diagnosis not present

## 2017-11-27 LAB — URINALYSIS, ROUTINE W REFLEX MICROSCOPIC
Bilirubin Urine: NEGATIVE
GLUCOSE, UA: NEGATIVE mg/dL
Ketones, ur: NEGATIVE mg/dL
Leukocytes, UA: NEGATIVE
NITRITE: NEGATIVE
PH: 7 (ref 5.0–8.0)
Protein, ur: NEGATIVE mg/dL
Specific Gravity, Urine: 1.004 — ABNORMAL LOW (ref 1.005–1.030)

## 2017-11-27 LAB — COMPREHENSIVE METABOLIC PANEL
ALBUMIN: 3 g/dL — AB (ref 3.5–5.0)
ALT: 35 U/L (ref 17–63)
AST: 66 U/L — AB (ref 15–41)
Alkaline Phosphatase: 165 U/L — ABNORMAL HIGH (ref 38–126)
Anion gap: 7 (ref 5–15)
BILIRUBIN TOTAL: 0.9 mg/dL (ref 0.3–1.2)
BUN: 8 mg/dL (ref 6–20)
CO2: 26 mmol/L (ref 22–32)
CREATININE: 0.79 mg/dL (ref 0.61–1.24)
Calcium: 8.5 mg/dL — ABNORMAL LOW (ref 8.9–10.3)
Chloride: 96 mmol/L — ABNORMAL LOW (ref 101–111)
GFR calc Af Amer: >60 mL/min (ref >60)
GFR calc non Af Amer: >60 mL/min (ref >60)
Glucose, Bld: 269 mg/dL — ABNORMAL HIGH (ref 65–99)
POTASSIUM: 3.9 mmol/L (ref 3.5–5.1)
Sodium: 129 mmol/L — ABNORMAL LOW (ref 135–145)
TOTAL PROTEIN: 7.9 g/dL (ref 6.5–8.1)

## 2017-11-27 LAB — CBC WITH DIFFERENTIAL/PLATELET
BASOS ABS: 0 10*3/uL (ref 0.0–0.1)
Basophils Relative: 0 %
EOS PCT: 2 %
Eosinophils Absolute: 0.1 10*3/uL (ref 0.0–0.7)
HCT: 39.6 % (ref 39.0–52.0)
Hemoglobin: 13.5 g/dL (ref 13.0–17.0)
LYMPHS PCT: 25 %
Lymphs Abs: 1.7 10*3/uL (ref 0.7–4.0)
MCH: 32.1 pg (ref 26.0–34.0)
MCHC: 34.1 g/dL (ref 30.0–36.0)
MCV: 94.3 fL (ref 78.0–100.0)
MONO ABS: 0.7 10*3/uL (ref 0.1–1.0)
Monocytes Relative: 10 %
Neutro Abs: 4.4 10*3/uL (ref 1.7–7.7)
Neutrophils Relative %: 63 %
Platelets: 145 10*3/uL — ABNORMAL LOW (ref 150–400)
RBC: 4.2 MIL/uL — ABNORMAL LOW (ref 4.22–5.81)
RDW: 13.4 % (ref 11.5–15.5)
WBC: 7 10*3/uL (ref 4.0–10.5)

## 2017-11-27 LAB — CREATININE, SERUM
CREATININE: 0.68 mg/dL (ref 0.61–1.24)
GFR calc Af Amer: >60 mL/min (ref >60)
GFR calc non Af Amer: >60 mL/min (ref >60)

## 2017-11-27 LAB — I-STAT CG4 LACTIC ACID, ED
Lactic Acid, Venous: 1.96 mmol/L — ABNORMAL HIGH (ref 0.5–1.9)
Lactic Acid, Venous: 1.31 mmol/L (ref 0.5–1.9)

## 2017-11-27 LAB — GLUCOSE, CAPILLARY: Glucose-Capillary: 178 mg/dL — ABNORMAL HIGH (ref 65–99)

## 2017-11-27 LAB — CBC
HEMATOCRIT: 38 % — AB (ref 39.0–52.0)
HEMOGLOBIN: 13.1 g/dL (ref 13.0–17.0)
MCH: 32 pg (ref 26.0–34.0)
MCHC: 34.5 g/dL (ref 30.0–36.0)
MCV: 92.9 fL (ref 78.0–100.0)
Platelets: 142 10*3/uL — ABNORMAL LOW (ref 150–400)
RBC: 4.09 MIL/uL — AB (ref 4.22–5.81)
RDW: 13.3 % (ref 11.5–15.5)
WBC: 6.4 10*3/uL (ref 4.0–10.5)

## 2017-11-27 LAB — HEMOGLOBIN A1C
HEMOGLOBIN A1C: 6.1 % — AB (ref 4.8–5.6)
MEAN PLASMA GLUCOSE: 128.37 mg/dL

## 2017-11-27 MED ORDER — VANCOMYCIN HCL IN DEXTROSE 1-5 GM/200ML-% IV SOLN: 1000.0000 mg | Freq: Once | INTRAVENOUS | Status: DC

## 2017-11-27 MED ORDER — ENOXAPARIN SODIUM 40 MG/0.4ML ~~LOC~~ SOLN
40.0000 mg | SUBCUTANEOUS | Status: DC
  Administered 2017-11-27 – 2017-11-29 (×3): 40 mg via SUBCUTANEOUS
  Filled 2017-11-27 (×3): qty 0.4

## 2017-11-27 MED ORDER — INSULIN ASPART 100 UNIT/ML ~~LOC~~ SOLN
0.0000 [IU] | Freq: Three times a day (TID) | SUBCUTANEOUS | Status: DC
  Administered 2017-11-29: 2 [IU] via SUBCUTANEOUS
  Administered 2017-11-29: 1 [IU] via SUBCUTANEOUS
  Administered 2017-11-29 – 2017-11-30 (×2): 2 [IU] via SUBCUTANEOUS
  Administered 2017-11-30: 1 [IU] via SUBCUTANEOUS

## 2017-11-27 MED ORDER — ONDANSETRON HCL 4 MG/2ML IJ SOLN
4.0000 mg | Freq: Four times a day (QID) | INTRAMUSCULAR | Status: DC | PRN
  Administered 2017-11-27: 4 mg via INTRAVENOUS

## 2017-11-27 MED ORDER — POLYETHYLENE GLYCOL 3350 17 G PO PACK: 17.0000 g | PACK | Freq: Every day | ORAL | Status: DC | PRN

## 2017-11-27 MED ORDER — VANCOMYCIN HCL 10 G IV SOLR
2000.0000 mg | Freq: Once | INTRAVENOUS | Status: AC
  Administered 2017-11-27: 2000 mg via INTRAVENOUS
  Filled 2017-11-27: qty 2000

## 2017-11-27 MED ORDER — VANCOMYCIN HCL IN DEXTROSE 1-5 GM/200ML-% IV SOLN
1000.0000 mg | Freq: Three times a day (TID) | INTRAVENOUS | Status: DC
  Administered 2017-11-28 – 2017-11-30 (×8): 1000 mg via INTRAVENOUS
  Filled 2017-11-27 (×9): qty 200

## 2017-11-27 MED ORDER — LOSARTAN POTASSIUM 50 MG PO TABS
50.0000 mg | ORAL_TABLET | Freq: Every day | ORAL | Status: DC
  Administered 2017-11-28 – 2017-11-30 (×3): 50 mg via ORAL
  Filled 2017-11-27 (×4): qty 1

## 2017-11-27 MED ORDER — VITAMIN B-1 100 MG PO TABS
100.0000 mg | ORAL_TABLET | Freq: Every day | ORAL | Status: DC
  Administered 2017-11-27 – 2017-11-30 (×4): 100 mg via ORAL
  Filled 2017-11-27 (×4): qty 1

## 2017-11-27 MED ORDER — ACETAMINOPHEN 650 MG RE SUPP: 650.0000 mg | Freq: Four times a day (QID) | RECTAL | Status: DC | PRN

## 2017-11-27 MED ORDER — ONDANSETRON HCL 4 MG/2ML IJ SOLN
4.0000 mg | Freq: Once | INTRAMUSCULAR | Status: DC
  Filled 2017-11-27: qty 2

## 2017-11-27 MED ORDER — SODIUM CHLORIDE 0.9 % IV BOLUS
1000.0000 mL | Freq: Once | INTRAVENOUS | Status: AC
  Administered 2017-11-27: 1000 mL via INTRAVENOUS

## 2017-11-27 MED ORDER — MORPHINE SULFATE (PF) 4 MG/ML IV SOLN
4.0000 mg | Freq: Once | INTRAVENOUS | Status: AC
  Administered 2017-11-27: 4 mg via INTRAVENOUS
  Filled 2017-11-27: qty 1

## 2017-11-27 MED ORDER — AMLODIPINE BESYLATE 10 MG PO TABS
10.0000 mg | ORAL_TABLET | Freq: Every day | ORAL | Status: DC
  Administered 2017-11-28 – 2017-11-30 (×3): 10 mg via ORAL
  Filled 2017-11-27 (×3): qty 1

## 2017-11-27 MED ORDER — CEFAZOLIN SODIUM-DEXTROSE 1-4 GM/50ML-% IV SOLN
1.0000 g | Freq: Once | INTRAVENOUS | Status: AC
  Administered 2017-11-27: 1 g via INTRAVENOUS
  Filled 2017-11-27: qty 50

## 2017-11-27 MED ORDER — KETOROLAC TROMETHAMINE 15 MG/ML IJ SOLN
15.0000 mg | Freq: Four times a day (QID) | INTRAMUSCULAR | Status: DC | PRN
  Administered 2017-11-27 – 2017-11-30 (×5): 15 mg via INTRAVENOUS
  Filled 2017-11-27 (×5): qty 1

## 2017-11-27 MED ORDER — ACETAMINOPHEN 325 MG PO TABS
650.0000 mg | ORAL_TABLET | Freq: Four times a day (QID) | ORAL | Status: DC | PRN
  Administered 2017-11-29: 650 mg via ORAL
  Filled 2017-11-27: qty 2

## 2017-11-27 MED ORDER — INSULIN ASPART 100 UNIT/ML ~~LOC~~ SOLN: 0.0000 [IU] | Freq: Every day | SUBCUTANEOUS | Status: DC

## 2017-11-27 MED ORDER — ONDANSETRON HCL 4 MG PO TABS: 4.0000 mg | ORAL_TABLET | Freq: Four times a day (QID) | ORAL | Status: DC | PRN

## 2017-11-27 MED ORDER — SODIUM CHLORIDE 0.9 % IV SOLN
2.0000 g | Freq: Three times a day (TID) | INTRAVENOUS | Status: DC
  Administered 2017-11-27 – 2017-11-30 (×8): 2 g via INTRAVENOUS
  Filled 2017-11-27 (×10): qty 2

## 2017-11-27 NOTE — ED Provider Notes (Signed)
[facility] EMERGENCY DEPARTMENT Provider Note   CSN: [id number] Arrival date & time: 11/27/17  1033     History   Chief Complaint Chief Complaint  Patient presents with  . Leg Swelling    HPI Nathan Roberts is a 57 y.o. male.  HPI   Nathan Roberts is a 57 y.o. male, with a history of HTN, GERD, and hyperlipidemia, presenting to the ED with right lower leg pain, swelling, and erythema for the last two weeks, worsening over the last 3 to 4 days. Pain is 6/10 when lying down, but 10/10 when standing, feels like a soreness and pressure, with intermittent spasms, nonradiating. States he had a right lower leg infection for which he was hospitalized in Dec 2018. He states he was told it originally occurred due to a reaction from a combination of his using a hot tub three times a day and the chlorine in the water. He continued to have crusting until March, but was showing improvement and had no swelling.  He was evaluated by two other facilities and was told at one place he had athletes foot ([facility], treated with two 7 day courses of antifungals) and the other time was told it was an autoimmune issue.  A month ago he began using the hot tub again, redness began to recur, and two weeks ago he began to have significant swelling, crusting, and purulent discharge. Has been wearing steel toed boots for work for 12 hours at a time. Endorses polydipsia and polyuria. Drinks 12-20 beers per week. Denies illicit drug use. Last ABX or hospitalization was in Dec 2018.  Denies fever/chills, N/V, CP, SOB, neuro deficits, or any other complaints.      Past Medical History:  Diagnosis Date  . Allergy   . Arthritis   . Carpal tunnel syndrome   . GERD (gastroesophageal reflux disease)   . Hyperlipidemia   . Hypertension     Patient Active Problem List   Diagnosis Date Noted  . Elevated LFTs 02/02/2014  . Family history of lung cancer 01/07/2014  . Hyperlipidemia 12/18/2013    . Allergic rhinitis 12/18/2013  . GERD (gastroesophageal reflux disease) 12/18/2013  . Generalized anxiety disorder 12/18/2013  . Prostate cancer screening 12/18/2013  . Chronic sinusitis 02/23/2012  . Persistent headaches 02/23/2012  . Insomnia 02/23/2012  . Cervical disc disease 11/01/2011  . Hypertension 11/01/2011    Past Surgical History:  Procedure Laterality Date  . abscess     rectal area  . CARPAL TUNNEL RELEASE     left  . CERVICAL FUSION    . SPINE SURGERY          Home Medications    Prior to Admission medications   Medication Sig Start Date End Date Taking? Authorizing Provider  amLODipine (NORVASC) 10 MG tablet TAKE 1 TABLET BY MOUTH EVERY DAY 11/13/17   Collie Siad A, MD  clindamycin (CLEOCIN) 150 MG capsule Take 1 capsule (150 mg total) by mouth every 6 (six) hours. 07/25/17   Elvina Sidle, MD  furosemide (LASIX) 40 MG tablet Take 1 tablet (40 mg total) by mouth daily. 07/25/17   Elvina Sidle, MD  losartan (COZAAR) 100 MG tablet TAKE 1/2 TABLET (50 MG TOTAL) BY MOUTH DAILY. 11/13/17   Doristine Bosworth, MD  mupirocin ointment (BACTROBAN) 2 % Apply 1 application topically 3 (three) times daily. 07/25/17   Elvina Sidle, MD  predniSONE (DELTASONE) 20 MG tablet Two daily with food 07/25/17   Elvina Sidle, MD  sildenafil (VIAGRA) 50 MG tablet Take 50 mg by mouth as needed. 11/01/11   Roderick Pee, MD    Family History Family History  Problem Relation Age of Onset  . Cancer Mother 44       lung--smoker  . Early death Mother   . Atrial fibrillation Father   . Heart disease Father   . COPD Father   . Hyperlipidemia Father   . Hypertension Father   . Colon cancer Neg Hx   . Esophageal cancer Neg Hx   . Stomach cancer Neg Hx   . Rectal cancer Neg Hx     Social History Social History   Tobacco Use  . Smoking status: Former Smoker    Types: Cigarettes    Last attempt to quit: 12/08/1996    Years since quitting: 20.9  . Smokeless  tobacco: Never Used  Substance Use Topics  . Alcohol use: Yes    Alcohol/week: 5.3 oz    Types: 2 Glasses of wine, 5 Cans of beer, 1 Shots of liquor, 1 Standard drinks or equivalent per week  . Drug use: No     Allergies   Latex   Review of Systems Review of Systems  Constitutional: Negative for chills and fever.  Respiratory: Negative for shortness of breath.   Cardiovascular: Negative for chest pain.  Gastrointestinal: Negative for nausea and vomiting.  Skin: Positive for color change and wound.  All other systems reviewed and are negative.    Physical Exam Updated Vital Signs BP (!) 158/90 (BP Location: Right Arm)   Pulse 76   Temp 99.1 F (37.3 C) (Oral)   Resp 16   Ht  (1.727 m)   Wt 95.3 kg (210 lb)   SpO2 98%   BMI 31.93 kg/m   Physical Exam  Constitutional: He appears well-developed and well-nourished. No distress.  HENT:  Head: Normocephalic and atraumatic.  Eyes: Conjunctivae are normal.  Neck: Neck supple.  Cardiovascular: Normal rate, regular rhythm, normal heart sounds and intact distal pulses.  Pulmonary/Chest: Effort normal and breath sounds normal. No respiratory distress.  Abdominal: Soft. There is no tenderness. There is no guarding.  Musculoskeletal: He exhibits edema.  Swelling, erythema, and crusting to the right lower leg with purulent discharge. Significant pitting edema. No notable tenderness.   Lymphadenopathy:    He has no cervical adenopathy.  Neurological: He is alert.  Sensation intact in bilateral lower extremities.  Strength 5/5 in bilateral knees and ankles.   Skin: Skin is warm and dry. Capillary refill takes less than 2 seconds. He is not diaphoretic.  Psychiatric: He has a normal mood and affect. His behavior is normal.  Nursing note and vitals reviewed.                      ED Treatments / Results  Labs (all labs ordered are listed, but only abnormal results are displayed) Labs Reviewed    COMPREHENSIVE METABOLIC PANEL - Abnormal; Notable for the following components:      Result Value   Sodium 129 (*)    Chloride 96 (*)    Glucose, Bld 269 (*)    Calcium 8.5 (*)    Albumin 3.0 (*)    AST 66 (*)    Alkaline Phosphatase 165 (*)    All other components within normal limits  CBC WITH DIFFERENTIAL/PLATELET - Abnormal; Notable for the following components:   RBC 4.20 (*)    Platelets 145 (*)  All other components within normal limits  I-STAT CG4 LACTIC ACID, ED - Abnormal; Notable for the following components:   Lactic Acid, Venous 1.96 (*)    All other components within normal limits  MRSA PCR SCREENING  URINALYSIS, ROUTINE W REFLEX MICROSCOPIC  I-STAT CG4 LACTIC ACID, ED    EKG None  Radiology Dg Ankle Complete Right  Result Date: 11/27/2017 CLINICAL DATA:  Pre diabetes. Right foot and ankle pain and swelling with weeping wound. EXAM: RIGHT ANKLE - COMPLETE 3+ VIEW COMPARISON:  None. FINDINGS: The bones appear mildly demineralized. There is no evidence of acute fracture, dislocation or bone destruction. The joint spaces are preserved. There is diffuse soft tissue edema, especially over the dorsum of the foot. No foreign body or definite soft tissue emphysema. IMPRESSION: Soft tissue edema/swelling without evidence of osteomyelitis. Electronically Signed   By: Carey Bullocks M.D.   On: 11/27/2017 17:20   Dg Foot Complete Right  Result Date: 11/27/2017 CLINICAL DATA:  Pre diabetes. Right foot and ankle pain and swelling with weeping wound. EXAM: RIGHT FOOT COMPLETE - 3+ VIEW COMPARISON:  None. FINDINGS: The bones appear mildly demineralized. There is minimal joint space narrowing at the 1st metatarsophalangeal joint. No evidence of acute fracture, dislocation or bone destruction. There is diffuse soft tissue swelling, especially over the dorsum of the forefoot. No foreign body or definite soft tissue emphysema. IMPRESSION: Soft tissue swelling without radiographic  evidence of osteomyelitis. Electronically Signed   By: Carey Bullocks M.D.   On: 11/27/2017 17:19    Procedures Procedures (including critical care time)  Medications Ordered in ED Medications  sodium chloride 0.9 % bolus 1,000 mL (1,000 mLs Intravenous New Bag/Given 11/27/17 1640)  morphine 4 MG/ML injection 4 mg (has no administration in time range)  ondansetron (ZOFRAN) injection 4 mg (has no administration in time range)  ceFAZolin (ANCEF) IVPB 1 g/50 mL premix (has no administration in time range)     Initial Impression / Assessment and Plan / ED Course  I have reviewed the triage vital signs and the nursing notes.  Pertinent labs & imaging results that were available during my care of the patient were reviewed by me and considered in my medical decision making (see chart for details).  Clinical Course as of Nov 28 1734  Tue Nov 27, 2017  1654 Spoke with Dr. Nelson Chimes, IM resident. Agrees to admit the patient.   [SJ]    Clinical Course User Index [SJ] Lenix Kidd C, PA-C     Patient presents with evidence of infection to the right lower leg, worsening over the last 2 weeks.  Tachycardic at presentation to the ED and with a lactic acidosis. Lactic acidosis, hyponatremia, and hypochloremia addressed with IV fluids.  Elevated AST/ALT ratio suspected to be from chronic alcohol abuse.  Low albumin gives concern for chronically poor nutrition.  Hyperglycemia at 269 gives concern for new onset, undiagnosed diabetes.  Patient also may need evaluation of lower extremity blood flow during his admitted course.  Findings and plan of care discussed with Marily Memos, MD.   Vitals:   11/27/17 1039 11/27/17 1041 11/27/17 1249 11/27/17 1512  BP: (!) 161/86  138/88 (!) 158/90  Pulse: (!) 105  67 76  Resp: Temp: 99.1 F (37.3 C)     TempSrc: Oral     SpO2: 99%  100% 98%  Weight:  95.3 kg (210 lb)    Height:   (1.727 m)  Final Clinical Impressions(s) / ED Diagnoses     Final diagnoses:  Cellulitis of right lower extremity    ED Discharge Orders    None       Concepcion Living 11/27/17 1738    Mesner, Barbara Cower, MD 11/28/17 0009

## 2017-11-27 NOTE — H&P (Signed)
Date: 11/27/2017               Patient Name:  Nathan Roberts MRN: [id number]  DOB: April 07, 1961 Age / Sex: 57 y.o., male   PCP: Patient, No Pcp Per         Medical Service: Internal Medicine Teaching Service         Attending Physician: Dr. Rogelia Boga, Austin Miles, MD    First Contact: Brooke Bonito Pager: [phone]  Second Contact: Dr. Arnetha Courser Pager: [phone]       After Hours (After 5p/  First Contact Pager: [phone]  weekends / holidays): Second Contact Pager: [phone]   Chief Complaint: cellulitis of RLE  History of Present Illness: Mr. Emond is a 49 yoM with a PMHx of arthritis, GERD, HTN and pre-diabetes who presents to the emergency department for continued evaluation and treatment of rash on his right lower calf and ankle. He was initially evaluated for this 06/30/2017 at which time he was treated with cefazolin as well as dexamethasone for a concurrent whole body rash. He initially noticed it after continued use in the hot tub at [facility] 3 times daily. Ironically, he uses the hot tub to relieve foot pain after working on his feet 7 days weekly in steel toed boots. He returned to the ED on 07/25/17 for additional treatment of cellulitis as his prior episode had not resolved. At this time he completed a course of clindamycin and mupirocin without resolution. He was seen at a [facility] 08/12/17 and 08/20/17 at which time he was diagnosed with tinea pedis and given two back to back courses of terbinafine. He states that after all of these measures, his leg was approximately 75% better. He returned to using the hot tub at his gym with last use approximately two weeks ago. He reports some relief with hot baths using lavender salts. After this time he states that his right leg got increasingly worse and he now experiences pain with any dorsiflexion or plantarflexion of the ankle and is largely unable to bear weight. He can walk, but he states he must do so very slowly.  He  denies associated fever, chills. He does endorse polydipsia and polyuria and states that he has been told in the past that he has pre-diabetes. An A1c drawn at [facility] 08/12/2017 was 7.1. He was counseled that elevated blood sugars may be the cause of his poor wound healing.  Mr. Marxen history of alcohol abuse is unclear. It is well documented in the chart that he has drank to excess in the past, but most recently he has cut back from his usual habit of one case per weekend. He states that he has never experienced an inability to cut back on his drinking and denies withdrawal symptoms or becoming irritable when he does not drink alcohol. From a nutritional standpoint, he endorses taking in lots of veggies and staying away from fried foods.  Meds:  Current Meds  Medication Sig  . amLODipine (NORVASC) 10 MG tablet TAKE 1 TABLET BY MOUTH EVERY DAY  . GARLIC PO Take 1 tablet by mouth daily.  Marland Kitchen losartan (COZAAR) 100 MG tablet TAKE 1/2 TABLET (50 MG TOTAL) BY MOUTH DAILY.  . Multiple Vitamins-Minerals (MULTIVITAMIN WITH MINERALS) tablet Take 1 tablet by mouth daily.  Marland Kitchen omega-3 acid ethyl esters (LOVAZA) 1 g capsule Take 1 g by mouth 4 (four) times a week.  . TURMERIC PO Take 1 tablet by mouth daily.  Allergies: Allergies as of 11/27/2017 - Review Complete 11/27/2017  Allergen Reaction Noted  . Latex Hives 11/01/2011  . Cephalexin Rash 08/12/2017   Past Medical History:  Diagnosis Date  . Allergy   . Arthritis   . Carpal tunnel syndrome   . GERD (gastroesophageal reflux disease)   . Hyperlipidemia   . Hypertension     Family History: Atrial fibrillation in father, lung cancer in mother. No family history of DM.  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 Marital status unknown. Denies use of street drugs. Drinks beer without hard liquors ~1 case/weekend.  Review of Systems: A complete ROS was negative except as per HPI.  Physical  Exam: Blood pressure (!) 154/81, pulse 76, temperature 99.1 F (37.3 C), temperature source Oral, resp. rate 16, height  (1.727 m), weight 210 lb (95.3 kg), SpO2 97 %.  Vitals:   11/27/17 1845 11/27/17 1900 11/27/17 1915 11/27/17 2025  BP: (!) 143/80 (!) 148/82 (!) 143/83 140/79  Pulse: 74 72 73 75  Resp:    18  Temp:    99.4 F (37.4 C)  TempSrc:    Oral  SpO2: 97% 97% 99% 98%  Weight:      Height:       General: Vital signs reviewed.  Patient is well-developed and well-nourished, in no acute distress and cooperative with exam.  Head: Normocephalic and atraumatic. Eyes: EOMI, conjunctivae normal, no scleral icterus.  Cardiovascular: RRR, S1 normal, S2 normal, no murmurs, gallops, or rubs. Pulmonary/Chest: Clear to auscultation bilaterally, no wheezes, rales, or rhonchi. Abdominal: Soft, non-tender, non-distended, BS +, no masses, organomegaly, or guarding present.  Extremities: Marked edema and erythema to lower extremity with scaling of skin and mild serosanguineous discharge.  Left extremity with few erythematous rashes, no edema.  Pulses symmetric and intact bilaterally. No cyanosis or clubbing. Neurological: A&O x3, Strength is normal and symmetric bilaterally, cranial nerve II-XII are grossly intact, no focal motor deficit, sensory intact to light touch bilaterally.  Skin: Dry and peeling skin on right lower extremity, few macular erythematous rash on left lower extremity. Psychiatric: Normal mood and affect. speech and behavior is normal. Cognition and memory are normal.           DG right ankle and foot: personally reviewed.  Shows soft tissue edema without any evidence of osteomyelitis.  Doppler studies for right lower extremity.  On preliminary report it was negative for DVT.  Assessment & Plan by Problem: Principal Problem:   Cellulitis of right lower extremity Active Problems:   Elevated hemoglobin A1c   Transaminitis  #Cellulitis of RLE: Mr. Hartt  continues to suffer from cellulitis of his right lower extremity (shin, ankle, calf and foot) and mild infectious appearance of LLE likely associated with hot tub use and now refractory to multiple agents including clindamycin, terbinafine and cefazoline over approximately 6 months. - Initiate therapy with cefepime and vancomycin for pseudomonal and MRSA coverage - RLE doppler performed limited due to skin wounds was negative  #Elevated HbA1c: Mr. Rodman likely has diabetes and is currently symptomatic, complaining of polydipsia/polyuria. He has an A1c available in chart review of 7.1 and his POC glucose drawn today was 269. To this point, he has only been informed that he had pre-diabetes. This is likely an additional factor in his poor wound healing. Additional A1c will be drawn today and he will require counseling and initiation of diabetic therapy, likely metformin, at discharge. - SSI  #Transaminitis: Liver enzymes  have been consistently elevated during his last three visits. Today, his ALT is WNL but AST is approximately 66. It is unclear if prolonged alcohol use has caused lasting liver damage or if the elevated enzymes are due to some other cause. - HepC antibody testing - CIWA protocol, no Ativan ordered at this time  #HTN: Mildly elevated with SBP in 150s. Continue home amlodipine.  Diet: Consistent Carb PPX: Lovenox  Dispo: Admit patient to Inpatient with expected length of stay greater than 2 midnights.   Attestation for Student Documentation:  I personally was present and performed or re-performed the history, physical exam and medical decision-making activities of this service and have verified that the service and findings are accurately documented in the student's note.  Arnetha Courser, MD 11/27/2017, 8:43 PM  Signed: Dow Adolph, Medical Student 11/27/2017, 6:52 PM  Pager: [phone]

## 2017-11-27 NOTE — ED Triage Notes (Signed)
Pt states he was last seen for infection in right lower leg in December. He states that the area never fully healed and has been getting worse over the last month. Pt has swelling, redness, and weeping around ankle and into top if right foot.

## 2017-11-27 NOTE — Progress Notes (Addendum)
Pharmacy Antibiotic Note  Nathan Roberts is a 57 y.o. male admitted on 11/27/2017 with cellulitis.  Pharmacy has been consulted for vancomycin and cefepime dosing. Patient has a low severity allergy to cephalexin, but has tolerated cefazolin.   Plan: Vancomycin 2 grams IV loading dose x 1 Vancomycin 1 gram IV every 8 hours Cefepime 2 grams every 8 hours  F/U cultures, LOT, and clinical progress.  Height:  (172.7 cm) Weight: 210 lb (95.3 kg) IBW/kg (Calculated) : 68.4  Temp (24hrs), Avg:99.1 F (37.3 C), Min:99.1 F (37.3 C), Max:99.1 F (37.3 C)  Recent Labs  Lab 11/27/17 1050 11/27/17 1102  WBC 7.0  --   CREATININE 0.79  --   LATICACIDVEN  --  1.96*    Estimated Creatinine Clearance: 111.4 mL/min (by C-G formula based on SCr of 0.79 mg/dL).    Allergies  Allergen Reactions  . Latex Hives  . Cephalexin Rash    Thank you for allowing pharmacy to be a part of this patient's care.  Amiylah Anastos A Kristiana Jacko 11/27/2017 6:18 PM

## 2017-11-27 NOTE — Progress Notes (Signed)
LE venous duplex prelim: Limited imaging of RLE due to open wounds. No evidence of DVT bilat. Farrel Demark, RDMS, RVT

## 2017-11-28 ENCOUNTER — Inpatient Hospital Stay (HOSPITAL_COMMUNITY): Payer: BLUE CROSS/BLUE SHIELD

## 2017-11-28 ENCOUNTER — Ambulatory Visit: Payer: BLUE CROSS/BLUE SHIELD | Admitting: Family Medicine

## 2017-11-28 DIAGNOSIS — M79609 Pain in unspecified limb: Secondary | ICD-10-CM

## 2017-11-28 DIAGNOSIS — L03115 Cellulitis of right lower limb: Principal | ICD-10-CM

## 2017-11-28 DIAGNOSIS — I739 Peripheral vascular disease, unspecified: Secondary | ICD-10-CM

## 2017-11-28 LAB — RAPID URINE DRUG SCREEN, HOSP PERFORMED
AMPHETAMINES: NOT DETECTED
BENZODIAZEPINES: NOT DETECTED
Barbiturates: NOT DETECTED
Cocaine: NOT DETECTED
Opiates: NOT DETECTED
Tetrahydrocannabinol: NOT DETECTED

## 2017-11-28 LAB — COMPREHENSIVE METABOLIC PANEL
ALK PHOS: 125 U/L (ref 38–126)
ALT: 28 U/L (ref 17–63)
ANION GAP: 9 (ref 5–15)
AST: 48 U/L — ABNORMAL HIGH (ref 15–41)
Albumin: 2.5 g/dL — ABNORMAL LOW (ref 3.5–5.0)
BILIRUBIN TOTAL: 1 mg/dL (ref 0.3–1.2)
BUN: 10 mg/dL (ref 6–20)
CALCIUM: 8.4 mg/dL — AB (ref 8.9–10.3)
CO2: 26 mmol/L (ref 22–32)
Chloride: 98 mmol/L — ABNORMAL LOW (ref 101–111)
Creatinine, Ser: 0.67 mg/dL (ref 0.61–1.24)
GFR calc non Af Amer: >60 mL/min (ref >60)
Glucose, Bld: 144 mg/dL — ABNORMAL HIGH (ref 65–99)
Potassium: 3.8 mmol/L (ref 3.5–5.1)
SODIUM: 133 mmol/L — AB (ref 135–145)
TOTAL PROTEIN: 6.8 g/dL (ref 6.5–8.1)

## 2017-11-28 LAB — URINALYSIS, ROUTINE W REFLEX MICROSCOPIC
Bilirubin Urine: NEGATIVE
Glucose, UA: 50 mg/dL — AB
Ketones, ur: NEGATIVE mg/dL
Leukocytes, UA: NEGATIVE
Nitrite: NEGATIVE
PH: 6 (ref 5.0–8.0)
PROTEIN: 30 mg/dL — AB
RBC / HPF: >50 RBC/hpf — ABNORMAL HIGH (ref 0–5)
Specific Gravity, Urine: 1.018 (ref 1.005–1.030)

## 2017-11-28 LAB — CBC
HCT: 36.3 % — ABNORMAL LOW (ref 39.0–52.0)
HEMOGLOBIN: 12.2 g/dL — AB (ref 13.0–17.0)
MCH: 31.5 pg (ref 26.0–34.0)
MCHC: 33.6 g/dL (ref 30.0–36.0)
MCV: 93.8 fL (ref 78.0–100.0)
Platelets: 118 10*3/uL — ABNORMAL LOW (ref 150–400)
RBC: 3.87 MIL/uL — AB (ref 4.22–5.81)
RDW: 13.6 % (ref 11.5–15.5)
WBC: 5 10*3/uL (ref 4.0–10.5)

## 2017-11-28 LAB — GLUCOSE, CAPILLARY
GLUCOSE-CAPILLARY: 146 mg/dL — AB (ref 65–99)
GLUCOSE-CAPILLARY: 159 mg/dL — AB (ref 65–99)
Glucose-Capillary: 126 mg/dL — ABNORMAL HIGH (ref 65–99)
Glucose-Capillary: 159 mg/dL — ABNORMAL HIGH (ref 65–99)

## 2017-11-28 LAB — HEPATITIS C ANTIBODY (REFLEX): HCV AB: 0.1 {s_co_ratio} (ref 0.0–0.9)

## 2017-11-28 LAB — HIV ANTIBODY (ROUTINE TESTING W REFLEX): HIV Screen 4th Generation wRfx: NONREACTIVE

## 2017-11-28 LAB — MRSA PCR SCREENING: MRSA by PCR: POSITIVE — AB

## 2017-11-28 LAB — HCV COMMENT:

## 2017-11-28 MED ORDER — LIVING WELL WITH DIABETES BOOK
Freq: Once | Status: AC
  Administered 2017-11-28: 16:00:00
  Filled 2017-11-28: qty 1

## 2017-11-28 MED ORDER — MUPIROCIN 2 % EX OINT
1.0000 "application " | TOPICAL_OINTMENT | Freq: Two times a day (BID) | CUTANEOUS | Status: DC
  Administered 2017-11-28 – 2017-11-30 (×5): 1 via NASAL
  Filled 2017-11-28 (×2): qty 22

## 2017-11-28 MED ORDER — CHLORHEXIDINE GLUCONATE CLOTH 2 % EX PADS
6.0000 | MEDICATED_PAD | Freq: Every day | CUTANEOUS | Status: DC
  Administered 2017-11-28 – 2017-11-30 (×2): 6 via TOPICAL

## 2017-11-28 NOTE — Progress Notes (Addendum)
Inpatient Diabetes Program Recommendations  AACE/ADA: New Consensus Statement on Inpatient Glycemic Control (2015)  Target Ranges:  Prepandial:   less than 140 mg/dL      Peak postprandial:   less than 180 mg/dL (1-2 hours)      Critically ill patients:  140 - 180 mg/dL   Lab Results  Component Value Date   GLUCAP 159 (H) 11/28/2017   HGBA1C 6.1 (H) 11/27/2017    Review of Glycemic Control Results for Nathan Roberts, Nathan Roberts (MRN [id number]) as of 11/28/2017 14:14  Ref. Range 11/27/2017 20:27 11/28/2017 06:37 11/28/2017 11:17  Glucose-Capillary Latest Ref Range: 65 - 99 mg/dL 178 (H) 146 (H) 159 (H)   Diabetes history: Prediabetes Outpatient Diabetes medications: none Current orders for Inpatient glycemic control: Novolog 0-9 units TID, Novolog 0-5 units QHS  Inpatient Diabetes Program Recommendations:    Spoke with patient regarding referral on diabetes management. Explained ADA guidelines for diagnosis. Patient states that he has been exercising more, drinking more water and eliminating sugary beverages from diet. Praised patient that his A1C has decreased since he began lifestyle modifications and encouraged to continue.  Reviewed patient's current A1c of 6.1%, which is down from 7.1%. Explained what a A1c is and what it measures. Also reviewed goal A1c with patient, importance of good glucose control @ home, and blood sugar goals. Reviewed basic patho of DM, vascular changes that occurs from DM, the impact of stress and infection and co-morbidities associated.  Informed about Rel[employer] products at [facility] in order to get a meter if he chooses. Or meter and test kit (includes lancets and strips)([id number]) if he chooses at discharge.  Also, placed LWWDM so that patient can begin reviewing information related to diabetes and searching for interventions for prevention. Discussed Metformin at discharge, side effects and method of action. PAtient has no further questions at this time related to  diabetes.  Thanks, Bronson Curb, MSN, RNC-OB Diabetes Coordinator [phone] (8a-5p)

## 2017-11-28 NOTE — Progress Notes (Signed)
VASCULAR LAB PRELIMINARY  ARTERIAL  ABI completed:   Right side segmental pressure and ABI not be able to obtain due to patient has infection on site. According to the arterial waveforms, right lower extremity appears adequate flow.     RIGHT    LEFT    PRESSURE WAVEFORM  PRESSURE WAVEFORM  BRACHIAL 153 triphasic BRACHIAL 155 triphasic  DP Not obtained  DP 162 triphasic  AT   AT    PT Not obtained  PT 165 biphasic  PER   PER    GREAT TOE 98 normal GREAT TOE 108 normal    RIGHT LEFT  ABI Not obtainable 1.06     [name]  Nathan Roberts, RVT 11/28/2017, 4:02 PM

## 2017-11-28 NOTE — Progress Notes (Signed)
  Date: 11/28/2017  Patient name: Nathan Roberts  Medical record number: [id number]  Date of birth: 1961/05/08   I have seen and evaluated Nathan Roberts and discussed their care with the Residency Team. Nathan Roberts is a 57 yo man with HTN and pre-DM who presented with a rash starting in Dec 2018 of lower ext, arm, chest. He was dx with hot tub folliculitis and tx with ABX and steroids. Returned 12/26 with worsening of sxs and dx cellulitis and tx abx and cellulitis. Sxs persisted and in Jan, he was tx twice for a fungal infxn. Sxs persisted and he is now being admitted.   PMHx, Fam Hx, and/or Soc Hx : Works in [employer] are exposed to chemicals. Has 2 dogs. Job pays for gym membership  Vitals:   11/28/17 0556 11/28/17 1345  BP: (!) 152/85 (!) 151/88  Pulse: 69 72  Resp: 16   Temp: 98.8 F (37.1 C) 99.4 F (37.4 C)  SpO2: 98% 100%  T max 99.4 NAD Ext L patch of lichenification medical malleolus, scattered patches over LLE of erythematous, scaly, lichenification, non-blanching erythema RLE : sort of an inverted champagne bottle, peeling skin, erythematous, edematous, some seeping, raw looking areas R medial malleolus    06/30/17 pics on initial presentation reviewed and patchy erythema, not consistent with classic cellulitis  UA : hematuria  Assessment and Plan: I have seen and evaluated the patient as outlined above. I agree with the formulated Assessment and Plan as detailed in the residents' note, with the following changes: Nathan Roberts is a 57 yo man with pre--DM and HTN who presents with 6 months of a rash. Initially, it was legs, arms, and chest and not c/w a folliculitis or cellulitis. He has received courses of ABX, steroids, and fungal tx without resolution. It is possible that he now has a bacterial superinfection but I am doubting that this is pure cellulitis, either initially or currently. With 6 months of sxs, it is hard to tell by exam the etiology of his rash on his R  LE. The pics from 12/1 when he first presented has more of a livedo racemosa. With RBC's on his UA, vasculitis needs to be considered. Other considerations are contact dermatitis (wouldn't explain diffuse rash in Dec), venous status / statis dermatitis / panniculitis inc lipodermatosclerosis / MF.  1. Rash - cont vanc for now. Vasculitis W/U. Punch bx.  2. Pre-DM - diet, education.  Burns Spain, MD 5/1/20193:10 PM

## 2017-11-28 NOTE — Progress Notes (Signed)
Subjective: Nathan Roberts was resting comfortably in bed during rounds this morning. He reports that the swelling of his right foot and leg has decreased significantly and that his pain now is only a 2-3/10 from 10/10 on admission. He denies any fever or chills. He is able to ambulate to the bathroom as necessary.  Notably, he reports weight loss since his last A1c (January) which he thinks may contribute to his value decreasing by almost one point. He was counseled to continue these healthy behaviors to avoid progression to diabetes.  Objective:  Vital signs in last 24 hours: Vitals:   11/27/17 1900 11/27/17 1915 11/27/17 2025 11/28/17 0556  BP: (!) 148/82 (!) 143/83 140/79 (!) 152/85  Pulse: 72 73 75 69  Resp:   18 16  Temp:   99.4 F (37.4 C) 98.8 F (37.1 C)  TempSrc:   Oral Oral  SpO2: 97% 99% 98% 98%  Weight:      Height:       Physical Exam  Constitutional: He is oriented to person, place, and time and well-developed, well-nourished, and in no distress.  Cardiovascular: Normal rate, regular rhythm and normal heart sounds.  Pulmonary/Chest: Effort normal and breath sounds normal.  Abdominal: Soft. Bowel sounds are normal.  Neurological: He is alert and oriented to person, place, and time.  Skin: Skin is warm and dry. Rash noted. Rash is maculopapular. There is erythema.     Psychiatric: Mood, affect and judgment normal.   CBC Latest Ref Rng & Units 11/28/2017 11/27/2017 11/27/2017  WBC 4.0 - 10.5 K/uL 5.0 6.4 7.0  Hemoglobin 13.0 - 17.0 g/dL 12.2(L) 13.1 13.5  Hematocrit 39.0 - 52.0 % 36.3(L) 38.0(L) 39.6  Platelets 150 - 400 K/uL 118(L) 142(L) 145(L)   Lab Results  Component Value Date   HGBA1C 6.1 (H) 11/27/2017   Lab Results  Component Value Date   NA 133 (L) 11/28/2017   K 3.8 11/28/2017   CL 98 (L) 11/28/2017   CO2 26 11/28/2017   BMI Readings from Last 4 Encounters:  11/27/17 31.93 kg/m  05/24/17 30.75 kg/m  09/08/14 30.87 kg/m  03/09/14 29.91 kg/m     Assessment/Plan:  Principal Problem:   Cellulitis of right lower extremity Active Problems:   Elevated hemoglobin A1c   Transaminitis  #Cellulitis of RLE: Nathan Roberts continues to suffer from cellulitis of his right lower extremity (shin, ankle, calf and foot) and mild infectious appearance of LLE likely associated with hot tub use, improving. Causes of poor wound healing evaluated including poor perfusion and diabetes (see below). RLE doppler performed (limited due to skin wounds) was negative for embolus - Continue therapy with cefepime and vancomycin for pseudomonal and MRSA coverage - Bilateral ABIs ordered - Wound care to see - Continue toradol prn for pain  #Elevated HbA1c: Nathan Roberts has an A1c available in chart review of 7.1 and elevated POC glucoses. To this point, he has only been informed that he had pre-diabetes. This is likely an additional factor in his poor wound healing. A1c drawn 4/30 is 6.1, consistent with pre-diabetes. - SSI - Diabetic nutrition consult placed  #Transaminitis: Liver enzymes have been consistently elevated during his last three visits. ALT is WNL and AST is 48 down from 66. It is unclear if prolonged alcohol use has caused lasting liver damage or if the elevated enzymes are due to some other cause. - HepC antibody negative - CIWA protocol, no Ativan ordered at this time  #HTN: Mildly elevated with SBP  in 150s. Continue home amlodipine.  Diet: Consistent Carb PPX: Lovenox  Dispo: Anticipated discharge in approximately 2 day(s). Patient would like to establish care with internal medicine Center.  Dow Adolph, Medical Student 11/28/2017, 1:05 PM Pager: [phone]  Attestation for Student Documentation:  I personally was present and performed or re-performed the history, physical exam and medical decision-making activities of this service and have verified that the service and findings are accurately documented in the student's  note.  Arnetha Courser, MD 11/28/2017, 2:03 PM

## 2017-11-29 ENCOUNTER — Telehealth: Payer: Self-pay | Admitting: General Practice

## 2017-11-29 DIAGNOSIS — R3121 Asymptomatic microscopic hematuria: Secondary | ICD-10-CM

## 2017-11-29 DIAGNOSIS — L500 Allergic urticaria: Secondary | ICD-10-CM | POA: Diagnosis not present

## 2017-11-29 DIAGNOSIS — R823 Hemoglobinuria: Secondary | ICD-10-CM

## 2017-11-29 LAB — ENA+DNA/DS+ANTICH+CENTRO+JO...
Centromere Ab Screen: <0.2 AI (ref 0.0–0.9)
Chromatin Ab SerPl-aCnc: 0.2 AI (ref 0.0–0.9)
ENA SM Ab Ser-aCnc: 0.2 AI (ref 0.0–0.9)
Ribonucleic Protein: 1 AI — ABNORMAL HIGH (ref 0.0–0.9)
SSA (Ro) (ENA) Antibody, IgG: <0.2 AI (ref 0.0–0.9)
SSB (La) (ENA) Antibody, IgG: <0.2 AI (ref 0.0–0.9)
Scleroderma (Scl-70) (ENA) Antibody, IgG: <0.2 AI (ref 0.0–0.9)
ds DNA Ab: 1 IU/mL (ref 0–9)

## 2017-11-29 LAB — ANA W/REFLEX IF POSITIVE: ANA: POSITIVE — AB

## 2017-11-29 LAB — GLUCOSE, CAPILLARY
Glucose-Capillary: 151 mg/dL — ABNORMAL HIGH (ref 65–99)
Glucose-Capillary: 124 mg/dL — ABNORMAL HIGH (ref 65–99)
Glucose-Capillary: 162 mg/dL — ABNORMAL HIGH (ref 65–99)
Glucose-Capillary: 124 mg/dL — ABNORMAL HIGH (ref 65–99)

## 2017-11-29 LAB — HEPATITIS B SURFACE ANTIBODY,QUALITATIVE: Hep B S Ab: NONREACTIVE

## 2017-11-29 LAB — MPO/PR-3 (ANCA) ANTIBODIES: Myeloperoxidase Abs: <9 U/mL (ref 0.0–9.0)

## 2017-11-29 LAB — HEPATITIS B SURFACE ANTIGEN: HEP B S AG: NEGATIVE

## 2017-11-29 LAB — HEPATITIS B CORE ANTIBODY, TOTAL: HEP B C TOTAL AB: NEGATIVE

## 2017-11-29 MED ORDER — LIDOCAINE HCL (PF) 2 % IJ SOLN
0.0000 mL | Freq: Once | INTRAMUSCULAR | Status: DC | PRN
  Filled 2017-11-29 (×2): qty 20

## 2017-11-29 MED ORDER — AMMONIUM LACTATE 12 % EX LOTN
TOPICAL_LOTION | CUTANEOUS | Status: DC
  Administered 2017-11-29: 16:00:00 via TOPICAL
  Filled 2017-11-29: qty 400

## 2017-11-29 MED ORDER — LIDOCAINE HCL 2 % IJ SOLN
20.0000 mL | Freq: Once | INTRAMUSCULAR | Status: DC
  Filled 2017-11-29: qty 20

## 2017-11-29 MED ORDER — HEPATITIS B VAC RECOMBINANT 10 MCG/ML IJ SUSP
1.0000 mL | Freq: Once | INTRAMUSCULAR | Status: DC
  Filled 2017-11-29: qty 1

## 2017-11-29 MED ORDER — HEPATITIS B VAC RECOMBINANT 10 MCG/0.5ML IJ SUSP
1.0000 mL | Freq: Once | INTRAMUSCULAR | Status: AC
  Administered 2017-11-29: 1 mL via INTRAMUSCULAR
  Filled 2017-11-29: qty 1

## 2017-11-29 NOTE — Consult Note (Signed)
WOC Nurse wound consult note Reason for Consult:Chronic skin changes consistent with venous insufficiency.  Resulting cellulitis.  Awaiting vascular studies. Patient has worn compression in the past but did not remain consistent.  Wound type:infectious/cellulitis Pressure Injury POA: NA Measurement: 4 cm x 5.5 cm x 0.2 cm denuded lesion to right medial malleolus.   Right leg with chronic dry, flaky skin. nonintact lesion to right medial malleolus.  Erythematous, painful and weeping Wound bed: red and weepy Drainage (amount, consistency, odor) minimal serosanguinous  No odor Periwound: lichenification and weeping.  Minimal edema today Dressing procedure/placement/frequency:Cleanse right leg with soap and water and pat dry.  Apply Amlactin cream from below knee to feet (avoid cellulitis lesion).  Aquacel Ag to right medial malleolus, moisten with sterile water.  Cover with 4x4 gauze and kerilx/tape.  Change every other day.  Starting Thursday.   Will not follow at this time.  Please re-consult if needed.  Maple Hudson RN BSN CWON Pager [phone]

## 2017-11-29 NOTE — Procedures (Signed)
Punch Biopsy Procedure Note  Pre-operative Diagnosis: punch biopsy to rule out vasculitis,/T-cell leukemia/arterial superinfection   Locations: Right and left lower extremities.  Anesthesia: Lidocaine-1.5 mL on each side.  Procedure Details  Patient informed of the risks (including bleeding and infection) and benefits of the procedure and verbal informed consent obtained.  The lesion and surrounding area was prepped with an alcohol swab. The skin was then stretched perpendicular to the skin tension lines and the lesion removed using the  Single 4 mm punch from both lower extremities.  The resulting ellipse was then closed. A sterile dressing applied. The specimen sent for pathologic examination. The patient tolerated the procedure well.  Condition: Stable  Complications: None

## 2017-11-29 NOTE — Plan of Care (Signed)
?  Problem: Clinical Measurements: ?Goal: Will remain free from infection ?Outcome: Progressing ?Goal: Diagnostic test results will improve ?Outcome: Progressing ?Goal: Respiratory complications will improve ?Outcome: Progressing ?  ?

## 2017-11-29 NOTE — Progress Notes (Addendum)
  Date: 11/29/2017  Patient name: Nathan Roberts  Medical record number: [id number]  Date of birth: 12/14/1960   I have seen and evaluated this patient and I have discussed the plan of care with the house staff. Please see their note for complete details. I concur with their findings with the following additions/corrections: Mr Sadowski was seen on AM rounds with the team. His RLE does have less erythema and edema today but has been in bed for 2 days with leg elevated. On Vanc. Denies hematuria but states he has been told for many yrs he has hematuria on UA's. Never had work up though. Denies abd pain, testicular pain, HA, joint pains, unintentional weight loss, nose bleeds, cough, dyspnea.   LABS Hep B negative for S Ab, S Ag, and core Ab ANA +  ANCA negative  1. RLE rash - I do not think this is just cellulitis for 6 months although a bacterial superinfection is possible. Wound care feels this is c/w venous insuff. Venous inusff was on my diff dx but the appearance in Dec is not venous insuff and it is unilateral rather than the typical bilateral. Other dx on diff inc vasculitis, MF. Initial vasculitis W/U neg, making it less likely. Two punch bx were obtained today to assist with the dx. I would favor D/C today / tomorrow on an oral MRSA regimen with F/U in [facility] on the 10th when the bx would likely be resulted.  2. Hematuria, apparently chronic - if vasculitis is R/O with bx (labs do not suggest), he will need urology referral as he has RF for malignancy - age, sex, unknown chemical exposure, prior tobacco use. He is therefore high risk and will need cystolocpy and CT urography as outpt.  Possible D/C today or tomorrow to complete PO ABX course  Burns Spain, MD 11/29/2017, 1:33 PM

## 2017-11-29 NOTE — Telephone Encounter (Signed)
Hospital f/u est care per Dr Rogelia Boga patient appt 05/10 115pm/ NW

## 2017-11-29 NOTE — Progress Notes (Signed)
Subjective: [name] is subjectively the same as day prior. He reports that he has continued pain in the "raw" portion of his ankle particularly when it makes contact with surfaces like his bed or towels. Outside of this pain, he states he is doing well and has no complaints.  We discussed his microscopic hemoglobinuria today at the bedside. He states that he has been aware of having "blood in his urine" for approximately 10 years without follow-up or intervention.  Objective:  Vital signs in last 24 hours: Vitals:   11/27/17 2025 11/28/17 0556 11/28/17 1345 11/29/17 0424  BP: 140/79 (!) 152/85 (!) 151/88 138/86  Pulse: 75 69 72 68  Resp: Temp: 99.4 F (37.4 C) 98.8 F (37.1 C) 99.4 F (37.4 C) 98.6 F (37 C)  TempSrc: Oral Oral Oral Oral  SpO2: 98% 98% 100% 98%  Weight:      Height:        Physical Exam  Constitutional: He is oriented to person, place, and time. He appears well-developed and well-nourished.  HENT:  Head: Normocephalic and atraumatic.  Eyes: EOM are normal.  Cardiovascular: Normal rate and regular rhythm.  Pulmonary/Chest: Effort normal and breath sounds normal.  Abdominal: Soft.  Neurological: He is alert and oriented to person, place, and time.  Skin: Skin is warm and dry. Petechiae and rash noted. Rash is macular. He is not diaphoretic.  Dry, flaky peeling rash to RLE. Area of sloughed skin which is moist and draining over R medial malleolus. L leg with patches of dry skin and erythema.  Psychiatric: He has a normal mood and affect. His behavior is normal. Thought content normal.    Drugs of Abuse     Component Value Date/Time   LABOPIA NONE DETECTED 11/28/2017 1925   COCAINSCRNUR NONE DETECTED 11/28/2017 1925   LABBENZ NONE DETECTED 11/28/2017 1925   AMPHETMU NONE DETECTED 11/28/2017 1925   THCU NONE DETECTED 11/28/2017 1925   LABBARB NONE DETECTED 11/28/2017 1925    Urinalysis    Component Value Date/Time   COLORURINE YELLOW  11/28/2017 1924   APPEARANCEUR HAZY (A) 11/28/2017 1924   LABSPEC 1.018 11/28/2017 1924   PHURINE 6.0 11/28/2017 1924   GLUCOSEU 50 (A) 11/28/2017 1924   HGBUR LARGE (A) 11/28/2017 1924   BILIRUBINUR NEGATIVE 11/28/2017 1924   BILIRUBINUR n 11/01/2011 1511   KETONESUR NEGATIVE 11/28/2017 1924   PROTEINUR 30 (A) 11/28/2017 1924   UROBILINOGEN 2.0 11/01/2011 1511   NITRITE NEGATIVE 11/28/2017 1924   LEUKOCYTESUR NEGATIVE 11/28/2017 1924   Ankle-Brachial Index (11/28/2017 1602):  ABI completed:   Right side segmental pressure and ABI not be able to obtain due to patient has infection on site. According to the arterial waveforms, right lower extremity appears adequate flow.     Assessment/Plan:  Principal Problem:   Cellulitis of right lower extremity Active Problems:   Elevated hemoglobin A1c   Transaminitis  #Cellulitis of RLE:[name] continues to suffer from cellulitis of his right lower extremity (shin, ankle, calf and foot) and mild infectious appearance of LLE likely associated with hot tub use, improving. It is unclear whether this infection is superimposed and vasculitis becomes a possibility when combining these sx with hemoglobinuria. Causes of poor wound healing evaluated including poor perfusion and diabetes (see below). Negative for embolus on doppler. - Punch bx b/l performed today; pathology pending - Continue therapy with IV cefepime and vancomycin for pseudomonal and MRSA coverage - Switch to po doxycycline & ciprofloxacin  tomorrow in anticipation of dc - Bilateral ABIs with satisfactory perfusion - WOCN recommending EOD dressing changes; to go home with [facility] - Continue toradol prn for pain - Vasculitis w/u: negative ANCA, +ANA, mildly positive for RNP  #Elevated HbA1c: [name] has an A1c available in chart review of 7.1 and elevated POC glucoses. To this point, he has only been informed that he had pre-diabetes. This is likely an additional factor in his poor  wound healing. A1c drawn 4/30 is 6.1, consistent with pre-diabetes. Received DM education 11/28/17. - SSI; patient refusing - POC blood glucoses consistently below 160  #Transaminitis:Liver enzymes have been consistently elevated during his last three visits. ALT is WNL and AST is 48 down from 66. It is unclear if prolonged alcohol use has caused lasting liver damage or if the elevated enzymes are due to some other cause. 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 Urology appointment has been scheduled with Dr. Alvester Morin on 12/26/17 at 2:30pm.  #HTN: Mildly elevated with SBP in 150s. Continue home amlodipine.  Diet:Consistent Carb AVW:[id number]  Dispo: Anticipated discharge in approximately 1 day(s).   Dow Adolph, Medical Student 11/29/2017, 1:52 PM Pager: [phone]  Attestation for Student Documentation:  I personally was present and performed or re-performed the history, physical exam and medical decision-making activities of this service and have verified that the service and findings are accurately documented in the student's note.  Arnetha Courser, MD 11/29/2017, 4:02 PM

## 2017-11-30 DIAGNOSIS — Z22322 Carrier or suspected carrier of Methicillin resistant Staphylococcus aureus: Secondary | ICD-10-CM

## 2017-11-30 LAB — GLUCOSE, CAPILLARY
Glucose-Capillary: 134 mg/dL — ABNORMAL HIGH (ref 65–99)
Glucose-Capillary: 155 mg/dL — ABNORMAL HIGH (ref 65–99)

## 2017-11-30 LAB — VANCOMYCIN, TROUGH: Vancomycin Tr: 14 ug/mL — ABNORMAL LOW (ref 15–20)

## 2017-11-30 MED ORDER — DOXYCYCLINE HYCLATE 100 MG PO CAPS: 100.0000 mg | ORAL_CAPSULE | Freq: Two times a day (BID) | ORAL | 0 refills | Status: DC

## 2017-11-30 MED ORDER — AMMONIUM LACTATE 12 % EX LOTN: TOPICAL_LOTION | CUTANEOUS | 0 refills | Status: DC

## 2017-11-30 MED ORDER — CIPROFLOXACIN HCL 500 MG PO TABS: 500.0000 mg | ORAL_TABLET | Freq: Two times a day (BID) | ORAL | 0 refills | Status: DC

## 2017-11-30 NOTE — Care Management Note (Signed)
Case Management Note  Patient Details  Name: Nathan Roberts MRN: [id number] Date of Birth: Aug 08, 1960  Subjective/Objective:  57 yr old gentleman admitted with right lower leg cellulitis.                   Action/Plan: Case manager spoke with patient concerning discharge plan. Per Dr. Nelson Chimes patient will need followup with wound clinic. CM has arranged appointment for patient on Thursday, May 16,2019 at 9:15am, with arrival time at 9:00am. Case manager asked bedside RN to instruct patient on wound care and dressing application. Case manager called [facility] with referral for Home Health RN.     Expected Discharge Date:   11/30/17               Expected Discharge Plan:  Home w Home Health Services  In-House Referral:  NA  Discharge planning Services  CM Consult, Follow-up appt scheduled  Post Acute Care Choice:  Home Health Choice offered to:  Patient  DME Arranged:  N/A DME Agency:  NA  HH Arranged:  RN HH Agency:  [facility]  Status of Service:  Completed, signed off  If discussed at Long Length of Stay Meetings, dates discussed:    Additional Comments:  Nathan Guthrie, RN 11/30/2017, 12:39 PM

## 2017-11-30 NOTE — Progress Notes (Signed)
Pt discharged home. AVS reviewed in full and all questions answered, pt aware that scripts are ready for pick-up at his [facility]. MD gave patient work note. All belongings sent with patient. VSS. BP 126/72 (BP Location: Left Arm)   Pulse 72   Temp 98.8 F (37.1 C) (Oral)   Resp 16   Ht  (1.727 m)   Wt 95.3 kg (210 lb)   SpO2 99%   BMI 31.93 kg/m

## 2017-11-30 NOTE — Discharge Instructions (Signed)
It was pleasure taking care of you. As we discussed you have to take 2 different antibiotics called ciprofloxacin and doxycycline for 2 weeks. We are trying to arrange an home health nurse to come to your home for dressing change starting from Monday. We also make an appointment to see you in our clinic for next Friday, your biopsy results most likely will be available by that time and we can make necessity changes to your medication if needed. We also make an appointment for you to see urology on Dec 26, 2017 because of the blood in your urine.

## 2017-11-30 NOTE — Progress Notes (Signed)
  Date: 11/30/2017  Patient name: Nathan Roberts  Medical record number: [id number]  Date of birth: [birth year]   I have seen and evaluated this patient and I have discussed the plan of care with the house staff. Please see their note for complete details. I concur with their findings with the following additions/corrections: Mr Defino was seen during team AM rounds. Anxious about doing own dressing changes. Team able to get home health for QOD dressing changes. For [facility] F/U 10th. Path results pending. Seems better today - less edema and erythema. Cont ABX and D/U path.  Burns Spain, MD 11/30/2017, 4:33 PM

## 2017-11-30 NOTE — Progress Notes (Signed)
ANTIBIOTIC CONSULT NOTE   Pharmacy Consult for Vancomycin, Cefepime Indication: cellulits  Allergies  Allergen Reactions  . Latex Hives  . Cephalexin Rash    Patient Measurements: Height:  (172.7 cm) Weight: 210 lb (95.3 kg) IBW/kg (Calculated) : 68.4 Adjusted Body Weight:    Vital Signs: Temp: 98.7 F (37.1 C) (05/03 0500) Temp Source: Oral (05/03 0500) BP: 124/72 (05/03 0500) Pulse Rate: 74 (05/03 0500) Intake/Output from previous day: 05/02 0701 - 05/03 0700 In: 1620 [P.O.:1320; IV Piggyback:300] Out: 2800 [Urine:2800] Intake/Output from this shift: Total I/O In: 240 [P.O.:240] Out: -   Labs: Recent Labs    11/27/17 1806 11/28/17 0415  WBC 6.4 5.0  HGB 13.1 12.2*  PLT 142* 118*  CREATININE 0.68 0.67   Estimated Creatinine Clearance: 111.4 mL/min (by C-G formula based on SCr of 0.67 mg/dL). Recent Labs    11/30/17 0953  VANCOTROUGH 14*     Microbiology:   Medical History: Past Medical History:  Diagnosis Date  . Allergy   . Arthritis   . Carpal tunnel syndrome   . GERD (gastroesophageal reflux disease)   . Hyperlipidemia   . Hypertension    Assessment: ID: [id number] for cellulitis since Dec 2018, s/p multiple treatments without improvements. Afebrile, WBC WNL, LA down 1.31. Scr 0.67. CrCl>100.  Punch bx to eval for vasculitis 5/2. Vanco trough 14 in goal  Cefepime 4/30 >> Vanc 4/30 >>  5/3: Vanco trough 14: ok  4/30 MRSA PCR - positive 4/30 HCV - negative 4/30 HIV - negative  Goal of Therapy:  Vancomycin trough level 10-15 mcg/ml  Plan:  Vanc 1gm IV Q8H Cefepime 2gm IV Q8H    Zaccheus Edmister S. Merilynn Finland, PharmD, BCPS Clinical Staff Pharmacist Pager [phone]  Misty Stanley Stillinger 11/30/2017,10:57 AM

## 2017-11-30 NOTE — Discharge Summary (Signed)
Name: Nathan Roberts MRN: [id number] DOB: Mar 15, 1961 57 y.o. PCP: Patient, No Pcp Per  Date of Admission: 11/27/2017 10:35 AM Date of Discharge:  Attending Physician: Burns Spain, MD  Discharge Diagnosis: 1. Cellulitis of RLE 2. Pre-Diabetes 3. Hemoglobinuria  Principal Problem:   Cellulitis of right lower extremity Active Problems:   Elevated hemoglobin A1c   Transaminitis   Hemoglobinuria  Discharge Medications: Allergies as of 11/30/2017      Reactions   Latex Hives   Cephalexin Rash      Medication List    STOP taking these medications   clindamycin 150 MG capsule Commonly known as:  CLEOCIN   furosemide 40 MG tablet Commonly known as:  LASIX   mupirocin ointment 2 % Commonly known as:  BACTROBAN   predniSONE 20 MG tablet Commonly known as:  DELTASONE     TAKE these medications   amLODipine 10 MG tablet Commonly known as:  NORVASC TAKE 1 TABLET BY MOUTH EVERY DAY   ammonium lactate 12 % lotion Commonly known as:  LAC-HYDRIN Apply topically See admin instructions.   ciprofloxacin 500 MG tablet Commonly known as:  CIPRO Take 1 tablet (500 mg total) by mouth 2 (two) times daily for 14 days.   doxycycline 100 MG capsule Commonly known as:  VIBRAMYCIN Take 1 capsule (100 mg total) by mouth 2 (two) times daily for 14 days.   GARLIC PO Take 1 tablet by mouth daily.   losartan 100 MG tablet Commonly known as:  COZAAR TAKE 1/2 TABLET (50 MG TOTAL) BY MOUTH DAILY.   multivitamin with minerals tablet Take 1 tablet by mouth daily.   omega-3 acid ethyl esters 1 g capsule Commonly known as:  LOVAZA Take 1 g by mouth 4 (four) times a week.   TURMERIC PO Take 1 tablet by mouth daily.       Disposition and follow-up:   NathanNathan Roberts was discharged from [facility] in Stable condition.  At the hospital follow up visit please address:  1. Please assist Nathan Roberts with establishing care and review: A. Cellulitis of  RLE: resolving, abx 14d course (doxy, cipro for PsA/MRSA coverage) should end 5/17. He may require assistance with wound care, home health is scheduled until he can start getting care as an outpatient 5/16. B. Hemoglobinuria: Urology f/u appt with Dr. Alvester Morin, 5/29 at 2:30p  2.  Labs / imaging needed at time of follow-up: Fasting blood glucose/diabetes check  3.  Pending labs/ test needing follow-up: Skin punch biopsy pathology results  Follow-up Appointments: Follow-up Information    [facility]. Go on 12/26/2017.   Why:  At 2:30 PM to see Dr. Owens Loffler information: [address] [place] [address] [phone]       [facility]. Go on 12/07/2017.   Why:  At 1:15 PM Contact information: [address] [place] [address] [phone]       [facility]             . Go to.   Why:  Your appointment is scheduled for Thursday, Dec 13, 2017 at 9:15 am, you will need to arrive at 9:00am. If you are unable to keep this appointment please call [phone]. Contact information: [address] [place] [address] [phone]       [facility]-Home Follow up.   Specialty:  Home Health Services Why:  A representative from [facility] will contact you to arrange start date and time for your Home Health Nurse. Contact information: [address] [place] [address] [phone]           Hospital Course by problem list: Principal Problem:   Cellulitis of right lower extremity Active Problems:   Elevated hemoglobin A1c   Transaminitis   Hemoglobinuria   Nathan Roberts is a 57 yoM with a PMH of mild EtOH abuse, HTN, GERD and hyperlipidemia who presented to the ED with RLE pain, swelling and erythema worsening over the last two weeks.  #Cellulitis of RLE: Nathan Roberts has had recurrent and unresolved hot-tub  associated cellulitis of his RLE for approximately six months. He had been seen in our ED as well as at [facility] centers and failed therapy with clindamycin, terbinafine, and cefazoline. On admission, his right ankle and foot were edematous and he had severe skin sloughing up to the shins (photos in Media tab). Therapy was initiated with cefepime and vancomycin for pseudomonal and MRSA coverage. MRSA nasal swabs came back positive during this admission and patient remained on contact precautions for the duration of his hospital stay. Bilateral lower extremity dopplers revealed adequate perfusion to both lower extremities. Given the prolonged nature of his infection as well as the initial appearance of the rash, underlying vasculitic causes were also investigated. ANA and ANCA panels were reviewed and only RNP was mildly positive. Bilateral punch biopsies were taken the day prior to discharge for further evaluation at follow-up appointment. His condition continued to improve and patient was able to bear weight at the time of discharge. At time of discharge, Nathan Roberts was transitioned to ciprofloxacin and doxycycline for oral coverage of MRSA & PsA.Wound care recommended dressing changes every other day, and he will receive assistance from home health with this until he can be seen at an outpatient wound clinic.  #Elevated HbA1c: Nathan Roberts has a previously recorded A1c of 7.1 from January, but A1c during this admission was decreased to 6.1 (pre-diabetic range) due to lifestyle changes. Point of care glucoses were somewhat elevated. He was informed that insulin resistance may be a factor in his poor wound healing. He refused sliding scale insulin for the duration of his admission but did receive diabetic education from nutritionist. This should be closely monitored during primary care follow up.  #Transaminitis:Liver enzymes have been consistently mildly elevated during his last three visits. ALT was WNL during  this admission and AST was only mildly elevated. Hepatitis B & C tests were negative and patient denies drinking heavily, so etiology of transaminitis is unclear. Urine drug screen was also negative. It is unclear if there is surreptitious alcohol use or some other cause of hepatic injury. Should this persist, consider hepatic ultrasound in the future.  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 Urology appointmenthas been scheduled with Dr. Alvester Morin on 12/26/17 at 2:30pm.  #HTN: For the majority of his stay, Nathan Roberts systolic blood pressures remained in the 150s or below. Home amlodipine and losartan was continued and seems to be an appropriate regimen for him at this time.  Dispo: Anticipated discharge in approximately 0 day(s).   Discharge Vitals:   BP 126/72 (BP Location: Left Arm)   Pulse 72   Temp 98.8 F (37.1 C) (Oral)  Resp 16   Ht  (1.727 m)   Wt 210 lb (95.3 kg)   SpO2 99%   BMI 31.93 kg/m   Pertinent Labs, Studies, and Procedures: Bilateral LE punch bx  Discharge Instructions: Discharge Instructions    Diet - low sodium heart healthy   Complete by:  As directed    Increase activity slowly   Complete by:  As directed       Signed: Dow Adolph, Medical Student 11/30/2017, 2:53 PM   Pager: [phone]  Attestation for Student Documentation:  I personally was present and performed or re-performed the history, physical exam and medical decision-making activities of this service and have verified that the service and findings are accurately documented in the student's note.  Arnetha Courser, MD 12/01/2017, 2:51 PM

## 2017-11-30 NOTE — Progress Notes (Signed)
Patient educated on wound care, and given written instructions for home. Patient verbally understood how to do wound care, and had no questions at this time.   Wound care was done by [place] Ilah Boule,RN 11/30/17

## 2017-11-30 NOTE — Progress Notes (Signed)
Subjective: Nathan Roberts is somewhat anxious about going home today as he is worried about his ability to care for his RLE wound by himself. Outside of this pain he is doing well.  He has stated that he no longer has the option to do light duty at work and thus will require a work note until he is able to be on his feet for a full workday.  Objective:  Vital signs in last 24 hours: Vitals:   11/29/17 0424 11/29/17 1517 11/29/17 2100 11/30/17 0500  BP: 138/86 (!) 180/85 (!) 148/84 124/72  Pulse: 68 67 79 74  Resp: Temp: 98.6 F (37 C) 98.8 F (37.1 C) 98.8 F (37.1 C) 98.7 F (37.1 C)  TempSrc: Oral Oral Oral Oral  SpO2: 98% 99% 100% 97%  Weight:      Height:       Physical Exam  Constitutional: He is oriented to person, place, and time. He appears well-developed and well-nourished.  HENT:  Head: Normocephalic and atraumatic.  Eyes: EOM are normal.  Cardiovascular: Normal rate and regular rhythm.  Pulmonary/Chest: Effort normal and breath sounds normal.  Abdominal: Soft.  Musculoskeletal:       Right ankle: He exhibits swelling.  Feet:  Right Foot:  Skin Integrity: Positive for skin breakdown and erythema.  Neurological: He is alert and oriented to person, place, and time.  Skin: Skin is warm and dry. Rash noted. Rash is maculopapular. There is erythema.  Cellulitic RLE, improved from admission. Photos taken today viewable in media.  Psychiatric: He has a normal mood and affect. His behavior is normal. Thought content normal.   Lab Results  Component Value Date   ANA Positive (A) 11/28/2017   Lab Results  Component Value Date   WBC 5.0 11/28/2017   HGB 12.2 (L) 11/28/2017   HCT 36.3 (L) 11/28/2017   PLT 118 (L) 11/28/2017   GLUCOSE 144 (H) 11/28/2017   CHOL 182 05/24/2017   TRIG 256 (H) 05/24/2017   HDL 32 (L) 05/24/2017   LDLCALC 99 05/24/2017   ALT 28 11/28/2017   AST 48 (H) 11/28/2017   NA 133 (L) 11/28/2017   K 3.8 11/28/2017   CL 98 (L)  11/28/2017   CREATININE 0.67 11/28/2017   BUN 10 11/28/2017   CO2 26 11/28/2017   TSH 0.936 12/18/2013   PSA 0.67 12/18/2013   HGBA1C 6.1 (H) 11/27/2017     Assessment/Plan:  Principal Problem:   Cellulitis of right lower extremity Active Problems:   Elevated hemoglobin A1c   Transaminitis   Hemoglobinuria  #Cellulitis of RLE: Cellulitis of RLE is improving. Bilateral punch biopsies taken yesterday to rule out underlying cause pending in pathology. For discharge, we are transitioning from IV cefepime and vancomycin for pseudomonal and MRSA coverage. Oral medications will include ciprofloxacin and doxycycline. Wound care recommends every other day dressing changes, and Nathan Roberts may require assistance from home health or a wound care center. NSAIDs have been used for pain control.  #Elevated HbA1c: Nathan Roberts had a previously elevated A1c of 7.1 for which he opted to pursue lifestyle changes (diet & exercise) and was able to lose weight. During this admission, A1c drawn4/30 was 6.1, consistent with pre-diabetes. Received DM education 11/28/17. - SSI; patient refusing - POC blood glucoses consistently below 160  #Transaminitis:Liver enzymes have been  elevated during his last three visits. ALT is WNL, AST remains elevated in the high 40s. It is unclear if prolonged alcohol  use has caused lasting liver damage or if the elevated enzymes are due to some other cause. - HepC antibodynegative; HepB negative, non-immune, vaccination recommended. - CIWA protocol, has required no Ativan - UDS negative  #Hemoglobinuria: Patient has history of persistent, reportedly longstanding painless hemoglobinuria. Urology appointment has been scheduled with Dr. Alvester Morin on 12/26/17 at 2:30pm.  #HTN: Mildly elevated with SBP in 150s. Continue home amlodipine.  Diet:Consistent Carb AVW:[id number]  Dispo: Anticipated discharge in approximately 0 day(s).   Dow Adolph, Medical Student 11/30/2017,  1:02 PM Pager: [phone]  Attestation for Student Documentation:  I personally was present and performed or re-performed the history, physical exam and medical decision-making activities of this service and have verified that the service and findings are accurately documented in the student's note.  Arnetha Courser, MD 12/01/2017, 2:49 PM

## 2017-12-02 DIAGNOSIS — I10 Essential (primary) hypertension: Secondary | ICD-10-CM | POA: Diagnosis not present

## 2017-12-02 DIAGNOSIS — R238 Other skin changes: Secondary | ICD-10-CM | POA: Diagnosis not present

## 2017-12-02 DIAGNOSIS — K219 Gastro-esophageal reflux disease without esophagitis: Secondary | ICD-10-CM | POA: Diagnosis not present

## 2017-12-02 DIAGNOSIS — L03115 Cellulitis of right lower limb: Secondary | ICD-10-CM | POA: Diagnosis not present

## 2017-12-02 DIAGNOSIS — M1990 Unspecified osteoarthritis, unspecified site: Secondary | ICD-10-CM | POA: Diagnosis not present

## 2017-12-02 DIAGNOSIS — F1099 Alcohol use, unspecified with unspecified alcohol-induced disorder: Secondary | ICD-10-CM | POA: Diagnosis not present

## 2017-12-02 DIAGNOSIS — R7303 Prediabetes: Secondary | ICD-10-CM | POA: Diagnosis not present

## 2017-12-02 DIAGNOSIS — Z4800 Encounter for change or removal of nonsurgical wound dressing: Secondary | ICD-10-CM | POA: Diagnosis not present

## 2017-12-03 DIAGNOSIS — L03115 Cellulitis of right lower limb: Secondary | ICD-10-CM | POA: Diagnosis not present

## 2017-12-05 DIAGNOSIS — R7303 Prediabetes: Secondary | ICD-10-CM | POA: Diagnosis not present

## 2017-12-05 DIAGNOSIS — R238 Other skin changes: Secondary | ICD-10-CM | POA: Diagnosis not present

## 2017-12-05 DIAGNOSIS — L03115 Cellulitis of right lower limb: Secondary | ICD-10-CM | POA: Diagnosis not present

## 2017-12-05 DIAGNOSIS — F1099 Alcohol use, unspecified with unspecified alcohol-induced disorder: Secondary | ICD-10-CM | POA: Diagnosis not present

## 2017-12-05 DIAGNOSIS — M1990 Unspecified osteoarthritis, unspecified site: Secondary | ICD-10-CM | POA: Diagnosis not present

## 2017-12-05 DIAGNOSIS — Z4800 Encounter for change or removal of nonsurgical wound dressing: Secondary | ICD-10-CM | POA: Diagnosis not present

## 2017-12-05 DIAGNOSIS — K219 Gastro-esophageal reflux disease without esophagitis: Secondary | ICD-10-CM | POA: Diagnosis not present

## 2017-12-05 DIAGNOSIS — I10 Essential (primary) hypertension: Secondary | ICD-10-CM | POA: Diagnosis not present

## 2017-12-06 DIAGNOSIS — L03119 Cellulitis of unspecified part of limb: Secondary | ICD-10-CM | POA: Diagnosis not present

## 2017-12-06 NOTE — Telephone Encounter (Signed)
Transition of care call made to patient to f/u recent hospitalization-pt unable to speak "this is not a good time" and stated he has an appt to see [facility] tomorrow for HFU.  Appt time/date/location confirmed with pt.Nathan Roberts, [name]5/9/201912:05 PM   No further action needed-pt will fu tomorrow for HFU

## 2017-12-07 ENCOUNTER — Ambulatory Visit: Payer: BLUE CROSS/BLUE SHIELD | Admitting: Internal Medicine

## 2017-12-07 ENCOUNTER — Encounter: Payer: Self-pay | Admitting: Internal Medicine

## 2017-12-07 ENCOUNTER — Telehealth: Payer: Self-pay | Admitting: Internal Medicine

## 2017-12-07 DIAGNOSIS — L03115 Cellulitis of right lower limb: Secondary | ICD-10-CM

## 2017-12-07 DIAGNOSIS — L239 Allergic contact dermatitis, unspecified cause: Secondary | ICD-10-CM | POA: Diagnosis not present

## 2017-12-07 NOTE — Patient Instructions (Signed)
Nathan Roberts,  It was a pleasure meeting you today. Please follow up with the wound care center, below is the information. Please continue to take your antibiotics. A referral to dermatology has been made. Please follow up in one month to discuss your other chronic medical conditions and assess improvement in your lower extremity wound.  [facility]          Why:  Your appointment is scheduled for Thursday, Dec 13, 2017 at 9:15 am, you will need to arrive at 9:00am. If you are unable to keep this appointment please call [phone]. Contact information: [address] [place] [address] [phone]

## 2017-12-07 NOTE — Telephone Encounter (Signed)
Spoke w/ [facility]N will call her after visit to update her on md eval and ant changes.

## 2017-12-07 NOTE — Telephone Encounter (Signed)
New HFU pt coming in today @ 1:15pm for the [facility].  [facility] is requesting VO orders for this patient.  Please call Laquane back @ [facility] @ [phone] for the VO.

## 2017-12-07 NOTE — Progress Notes (Signed)
   CC: hospital follow up for right lower extremity cellulitis  HPI:  Mr.Nathan Roberts is a 57 y.o. with history noted below that presents to the acute care clinic for hospital follow-up of cellulitis of right lower extremity currently on doxycycline and ciprofloxacin. Please see problem based charting for the status of patient's chronic medical conditions.  Past Medical History:  Diagnosis Date  . Allergy   . Arthritis   . Carpal tunnel syndrome   . GERD (gastroesophageal reflux disease)   . Hyperlipidemia   . Hypertension     Review of Systems:  Review of Systems  Constitutional: Negative for malaise/fatigue.  Cardiovascular: Positive for leg swelling.  Gastrointestinal: Negative for abdominal pain, nausea and vomiting.     Physical Exam:  Vitals:   12/07/17 1413  BP: 135/70  Pulse: 65  Temp: 98.7 F (37.1 C)  TempSrc: Oral  SpO2: 100%  Weight: 205 lb (93 kg)   Physical Exam  Constitutional: He is well-developed, well-nourished, and in no distress.  Cardiovascular: Normal rate, regular rhythm and normal heart sounds. Exam reveals no gallop and no friction rub.  No murmur heard. Pulmonary/Chest: Effort normal and breath sounds normal. No respiratory distress. He has no wheezes. He has no rales.  Skin: No erythema.  Right lower extremity erythema to the shin below the knee.  Ankle cracking.  No purulent drainage.  No heat radiating from skin     Assessment & Plan:   See encounters tab for problem based medical decision making.    Patient seen with Dr. Heide Spark

## 2017-12-09 NOTE — Assessment & Plan Note (Signed)
Assessment: right lower extremity erythema  Patient was admitted to the hospital on 4/30 for unresolving cellulitis of right lower extremity for the past 6 months.  Patient was discharged with ciprofloxacin and doxycycline with end date of 5/17. Patient reports adherence to medication.  Today patient's leg appears erythematous and swollen. There is no heat radiating from the lower extremity. Patient denies nausea/vomiting or fever/chills.  Skin cracking noted at the ankle.  Patient states the appearance has remained stable since discharge.  Patient has had home health provide wound care services. I reminded him of his appointment on 5/16 for outpatient wound care. Patient had punch biopsy done in the hospital and pathologyshowed urticarial allergic reaction.  The PAS stain was negative for fungal hyphae.  Will refer to dermatology due to punch biopsy results.  Plan -Dermatology referral - continue antibiotics - note to stay out of work until may 20th - follow up in one month to assess chronic medical conditions

## 2017-12-10 NOTE — Progress Notes (Signed)
Internal Medicine Clinic Attending  I saw and evaluated the patient.  I personally confirmed the key portions of the history and exam documented by Dr. Hoffman and I reviewed pertinent patient test results.  The assessment, diagnosis, and plan were formulated together and I agree with the documentation in the resident's note.      

## 2017-12-11 ENCOUNTER — Encounter (HOSPITAL_BASED_OUTPATIENT_CLINIC_OR_DEPARTMENT_OTHER): Payer: BLUE CROSS/BLUE SHIELD | Attending: Internal Medicine

## 2017-12-11 DIAGNOSIS — B965 Pseudomonas (aeruginosa) (mallei) (pseudomallei) as the cause of diseases classified elsewhere: Secondary | ICD-10-CM | POA: Diagnosis not present

## 2017-12-11 DIAGNOSIS — L508 Other urticaria: Secondary | ICD-10-CM | POA: Insufficient documentation

## 2017-12-11 DIAGNOSIS — L97811 Non-pressure chronic ulcer of other part of right lower leg limited to breakdown of skin: Secondary | ICD-10-CM | POA: Insufficient documentation

## 2017-12-11 DIAGNOSIS — I10 Essential (primary) hypertension: Secondary | ICD-10-CM | POA: Diagnosis not present

## 2017-12-11 DIAGNOSIS — L03115 Cellulitis of right lower limb: Secondary | ICD-10-CM | POA: Insufficient documentation

## 2017-12-11 DIAGNOSIS — T24131A Burn of first degree of right lower leg, initial encounter: Secondary | ICD-10-CM | POA: Diagnosis not present

## 2017-12-12 NOTE — Telephone Encounter (Signed)
Have attempted to f/u w/ HHN x 2 will close

## 2017-12-13 ENCOUNTER — Ambulatory Visit: Payer: BLUE CROSS/BLUE SHIELD | Admitting: Family Medicine

## 2017-12-13 ENCOUNTER — Encounter (HOSPITAL_BASED_OUTPATIENT_CLINIC_OR_DEPARTMENT_OTHER): Payer: BLUE CROSS/BLUE SHIELD

## 2017-12-13 ENCOUNTER — Encounter: Payer: Self-pay | Admitting: Family Medicine

## 2017-12-13 VITALS — BP 122/70 | HR 75 | Temp 98.9°F | Resp 16 | Ht 68.0 in | Wt 204.8 lb

## 2017-12-13 DIAGNOSIS — L03115 Cellulitis of right lower limb: Secondary | ICD-10-CM

## 2017-12-13 DIAGNOSIS — I10 Essential (primary) hypertension: Secondary | ICD-10-CM | POA: Diagnosis not present

## 2017-12-13 NOTE — Patient Instructions (Addendum)
   IF you received an x-ray today, you will receive an invoice from [facility]. Please contact [facility] at [phone] with questions or concerns regarding your invoice.   IF you received labwork today, you will receive an invoice from [facility]. Please contact [facility] at [phone] with questions or concerns regarding your invoice.   Our billing staff will not be able to assist you with questions regarding bills from these companies.  You will be contacted with the lab results as soon as they are available. The fastest way to get your results is to activate your My Chart account. Instructions are located on the last page of this paperwork. If you have not heard from us regarding the results in 2 weeks, please contact this office.      Cellulitis, Adult Cellulitis is a skin infection. The infected area is usually red and tender. This condition occurs most often in the arms and lower legs. The infection can travel to the muscles, blood, and underlying tissue and become serious. It is very important to get treated for this condition. What are the causes? Cellulitis is caused by bacteria. The bacteria enter through a break in the skin, such as a cut, burn, insect bite, open sore, or crack. What increases the risk? This condition is more likely to occur in people who:  Have a weak defense system (immune system).  Have open wounds on the skin such as cuts, burns, bites, and scrapes. Bacteria can enter the body through these open wounds.  Are older.  Have diabetes.  Have a type of long-lasting (chronic) liver disease (cirrhosis) or kidney disease.  Use IV drugs.  What are the signs or symptoms? Symptoms of this condition include:  Redness, streaking, or spotting on the skin.  Swollen area of the skin.  Tenderness or pain when an area of the skin is touched.  Warm skin.  Fever.  Chills.  Blisters.  How is this diagnosed? This condition is  diagnosed based on a medical history and physical exam. You may also have tests, including:  Blood tests.  Lab tests.  Imaging tests.  How is this treated? Treatment for this condition may include:  Medicines, such as antibiotic medicines or antihistamines.  Supportive care, such as rest and application of cold or warm cloths (cold or warm compresses) to the skin.  Hospital care, if the condition is severe.  The infection usually gets better within 1-2 days of treatment. Follow these instructions at home:  Take over-the-counter and prescription medicines only as told by your health care provider.  If you were prescribed an antibiotic medicine, take it as told by your health care provider. Do not stop taking the antibiotic even if you start to feel better.  Drink enough fluid to keep your urine clear or pale yellow.  Do not touch or rub the infected area.  Raise (elevate) the infected area above the level of your heart while you are sitting or lying down.  Apply warm or cold compresses to the affected area as told by your health care provider.  Keep all follow-up visits as told by your health care provider. This is important. These visits let your health care provider make sure a more serious infection is not developing. Contact a health care provider if:  You have a fever.  Your symptoms do not improve within 1-2 days of starting treatment.  Your bone or joint underneath the infected area becomes painful after the skin has healed.  Your infection   returns in the same area or another area.  You notice a swollen bump in the infected area.  You develop new symptoms.  You have a general ill feeling (malaise) with muscle aches and pains. Get help right away if:  Your symptoms get worse.  You feel very sleepy.  You develop vomiting or diarrhea that persists.  You notice red streaks coming from the infected area.  Your red area gets larger or turns dark in  color. This information is not intended to replace advice given to you by your health care provider. Make sure you discuss any questions you have with your health care provider. Document Released: 04/26/2005 Document Revised: 11/25/2015 Document Reviewed: 05/26/2015 Elsevier Interactive Patient Education  2018 [employer]  

## 2017-12-13 NOTE — Progress Notes (Signed)
Chief Complaint  Patient presents with  . bp check up     diability paperwork    HPI  Pt was seen to follow up on cellulitis of RLE He was admitted 11/27/17 to 11/30/17 Currently on abx Referred to dermatology but has not gone yet  Denies fevers or chills Has warm extremities His RLE feel tight sometimes  Past Medical History:  Diagnosis Date  . Allergy   . Arthritis   . Carpal tunnel syndrome   . GERD (gastroesophageal reflux disease)   . Hyperlipidemia   . Hypertension     Current Outpatient Medications  Medication Sig Dispense Refill  . amLODipine (NORVASC) 10 MG tablet TAKE 1 TABLET BY MOUTH EVERY DAY 90 tablet 1  . ammonium lactate (LAC-HYDRIN) 12 % lotion Apply topically See admin instructions. 400 g 0  . GARLIC PO Take 1 tablet by mouth daily.    Marland Kitchen losartan (COZAAR) 100 MG tablet TAKE 1/2 TABLET (50 MG TOTAL) BY MOUTH DAILY. 45 tablet 1  . Multiple Vitamins-Minerals (MULTIVITAMIN WITH MINERALS) tablet Take 1 tablet by mouth daily.    Marland Kitchen omega-3 acid ethyl esters (LOVAZA) 1 g capsule Take 1 g by mouth 4 (four) times a week.    . TURMERIC PO Take 1 tablet by mouth daily.     No current facility-administered medications for this visit.     Allergies:  Allergies  Allergen Reactions  . Latex Hives  . Cephalexin Rash    Past Surgical History:  Procedure Laterality Date  . abscess     rectal area  . CARPAL TUNNEL RELEASE     left  . CERVICAL FUSION    . SPINE SURGERY      Social History   Socioeconomic History  . Marital status: Married    Spouse name: Not on file  . Number of children: Not on file  . Years of education: Not on file  . Highest education level: Not on file  Occupational History  . Not on file  Social Needs  . Financial resource strain: Not on file  . Food insecurity:    Worry: Not on file    Inability: Not on file  . Transportation needs:    Medical: Not on file    Non-medical: Not on file  Tobacco Use  . Smoking status: Former  Smoker    Types: Cigarettes    Last attempt to quit: 12/08/1996    Years since quitting: 21.1  . Smokeless tobacco: Never Used  Substance and Sexual Activity  . Alcohol use: Yes    Alcohol/week: 5.4 oz    Types: 2 Glasses of wine, 5 Cans of beer, 1 Shots of liquor, 1 Standard drinks or equivalent per week  . Drug use: No  . Sexual activity: Not Currently  Lifestyle  . Physical activity:    Days per week: Not on file    Minutes per session: Not on file  . Stress: Not on file  Relationships  . Social connections:    Talks on phone: Not on file    Gets together: Not on file    Attends religious service: Not on file    Active member of club or organization: Not on file    Attends meetings of clubs or organizations: Not on file    Relationship status: Not on file  Other Topics Concern  . Not on file  Social History Narrative  . Not on file    Family History  Problem Relation Age of  Onset  . Cancer Mother 56       lung--smoker  . Early death Mother   . Atrial fibrillation Father   . Heart disease Father   . COPD Father   . Hyperlipidemia Father   . Hypertension Father   . Colon cancer Neg Hx   . Esophageal cancer Neg Hx   . Stomach cancer Neg Hx   . Rectal cancer Neg Hx      ROS Review of Systems See HPI Constitution: No fevers or chills No malaise No diaphoresis Skin: No rash or itching Eyes: no blurry vision, no double vision GU: no dysuria or hematuria Neuro: no dizziness or headaches all others reviewed and negative   Objective: Vitals:   12/13/17 1217  BP: 122/70  Pulse: 75  Resp: 16  Temp: 98.9 F (37.2 C)  TempSrc: Oral  SpO2: 99%  Weight: 204 lb 12.8 oz (92.9 kg)  Height:  (1.727 m)    Physical Exam  Constitutional: He is oriented to person, place, and time. He appears well-developed and well-nourished.  HENT:  Head: Normocephalic and atraumatic.  Eyes: Conjunctivae and EOM are normal.  Cardiovascular: Normal rate, regular rhythm and  normal heart sounds.  No murmur heard. Pulmonary/Chest: Effort normal and breath sounds normal. No stridor. No respiratory distress.  Neurological: He is alert and oriented to person, place, and time.  Right lower extremity-  Trace edema to mid shin Extremities- warm, pulses 2+   Assessment and Plan Jvon was seen today for bp check up.  Diagnoses and all orders for this visit:  Cellulitis of right lower extremity - discussed home care -  Completed forms for work for time off  Essential hypertension Patient's blood pressure is at goal of 139/89 or less. Condition is stable. Continue current medications and treatment plan. I recommend that you exercise for 30-45 minutes 5 days a week. I also recommend a balanced diet with fruits and vegetables every day, lean meats, and little fried foods. The DASH diet (you can find this online) is a good example of this.    Nataki Mccrumb A Rena Sweeden

## 2017-12-14 DIAGNOSIS — L03115 Cellulitis of right lower limb: Secondary | ICD-10-CM | POA: Diagnosis not present

## 2017-12-14 DIAGNOSIS — L97811 Non-pressure chronic ulcer of other part of right lower leg limited to breakdown of skin: Secondary | ICD-10-CM | POA: Diagnosis not present

## 2017-12-14 DIAGNOSIS — S91001A Unspecified open wound, right ankle, initial encounter: Secondary | ICD-10-CM | POA: Insufficient documentation

## 2017-12-14 DIAGNOSIS — L508 Other urticaria: Secondary | ICD-10-CM | POA: Diagnosis not present

## 2017-12-14 DIAGNOSIS — X58XXXA Exposure to other specified factors, initial encounter: Secondary | ICD-10-CM | POA: Insufficient documentation

## 2017-12-14 DIAGNOSIS — I10 Essential (primary) hypertension: Secondary | ICD-10-CM | POA: Diagnosis not present

## 2017-12-17 ENCOUNTER — Ambulatory Visit: Payer: BLUE CROSS/BLUE SHIELD | Admitting: Family Medicine

## 2017-12-17 ENCOUNTER — Encounter

## 2017-12-21 DIAGNOSIS — I10 Essential (primary) hypertension: Secondary | ICD-10-CM | POA: Diagnosis not present

## 2017-12-21 DIAGNOSIS — L508 Other urticaria: Secondary | ICD-10-CM | POA: Diagnosis not present

## 2017-12-21 DIAGNOSIS — I872 Venous insufficiency (chronic) (peripheral): Secondary | ICD-10-CM | POA: Diagnosis not present

## 2017-12-21 DIAGNOSIS — L03115 Cellulitis of right lower limb: Secondary | ICD-10-CM | POA: Diagnosis not present

## 2017-12-21 DIAGNOSIS — T24131A Burn of first degree of right lower leg, initial encounter: Secondary | ICD-10-CM | POA: Diagnosis not present

## 2017-12-21 DIAGNOSIS — L97811 Non-pressure chronic ulcer of other part of right lower leg limited to breakdown of skin: Secondary | ICD-10-CM | POA: Diagnosis not present

## 2017-12-26 NOTE — Telephone Encounter (Signed)
Appt was kept with [facility] on 12/07/2017

## 2018-01-01 ENCOUNTER — Encounter (HOSPITAL_BASED_OUTPATIENT_CLINIC_OR_DEPARTMENT_OTHER): Payer: BLUE CROSS/BLUE SHIELD | Attending: Internal Medicine

## 2018-01-01 DIAGNOSIS — L97212 Non-pressure chronic ulcer of right calf with fat layer exposed: Secondary | ICD-10-CM | POA: Insufficient documentation

## 2018-01-01 DIAGNOSIS — S81801A Unspecified open wound, right lower leg, initial encounter: Secondary | ICD-10-CM | POA: Diagnosis not present

## 2018-01-01 DIAGNOSIS — I10 Essential (primary) hypertension: Secondary | ICD-10-CM | POA: Insufficient documentation

## 2018-01-01 DIAGNOSIS — I872 Venous insufficiency (chronic) (peripheral): Secondary | ICD-10-CM | POA: Diagnosis not present

## 2018-01-01 DIAGNOSIS — L97819 Non-pressure chronic ulcer of other part of right lower leg with unspecified severity: Secondary | ICD-10-CM | POA: Insufficient documentation

## 2018-01-01 DIAGNOSIS — L508 Other urticaria: Secondary | ICD-10-CM | POA: Diagnosis not present

## 2018-01-08 DIAGNOSIS — I10 Essential (primary) hypertension: Secondary | ICD-10-CM | POA: Diagnosis not present

## 2018-01-08 DIAGNOSIS — L508 Other urticaria: Secondary | ICD-10-CM | POA: Diagnosis not present

## 2018-01-08 DIAGNOSIS — L97212 Non-pressure chronic ulcer of right calf with fat layer exposed: Secondary | ICD-10-CM | POA: Diagnosis not present

## 2018-01-08 DIAGNOSIS — I872 Venous insufficiency (chronic) (peripheral): Secondary | ICD-10-CM | POA: Diagnosis not present

## 2018-01-08 DIAGNOSIS — S81801A Unspecified open wound, right lower leg, initial encounter: Secondary | ICD-10-CM | POA: Diagnosis not present

## 2018-01-08 DIAGNOSIS — L97819 Non-pressure chronic ulcer of other part of right lower leg with unspecified severity: Secondary | ICD-10-CM | POA: Diagnosis not present

## 2018-01-13 ENCOUNTER — Other Ambulatory Visit: Payer: Self-pay | Admitting: Internal Medicine

## 2018-01-17 DIAGNOSIS — I10 Essential (primary) hypertension: Secondary | ICD-10-CM | POA: Diagnosis not present

## 2018-01-17 DIAGNOSIS — L97212 Non-pressure chronic ulcer of right calf with fat layer exposed: Secondary | ICD-10-CM | POA: Diagnosis not present

## 2018-01-17 DIAGNOSIS — I872 Venous insufficiency (chronic) (peripheral): Secondary | ICD-10-CM | POA: Diagnosis not present

## 2018-01-17 DIAGNOSIS — L97819 Non-pressure chronic ulcer of other part of right lower leg with unspecified severity: Secondary | ICD-10-CM | POA: Diagnosis not present

## 2018-01-17 DIAGNOSIS — L508 Other urticaria: Secondary | ICD-10-CM | POA: Diagnosis not present

## 2018-01-17 DIAGNOSIS — S81801A Unspecified open wound, right lower leg, initial encounter: Secondary | ICD-10-CM | POA: Diagnosis not present

## 2018-01-24 DIAGNOSIS — L508 Other urticaria: Secondary | ICD-10-CM | POA: Diagnosis not present

## 2018-01-24 DIAGNOSIS — R21 Rash and other nonspecific skin eruption: Secondary | ICD-10-CM | POA: Diagnosis not present

## 2018-01-24 DIAGNOSIS — L97212 Non-pressure chronic ulcer of right calf with fat layer exposed: Secondary | ICD-10-CM | POA: Diagnosis not present

## 2018-01-24 DIAGNOSIS — I10 Essential (primary) hypertension: Secondary | ICD-10-CM | POA: Diagnosis not present

## 2018-01-24 DIAGNOSIS — S81801A Unspecified open wound, right lower leg, initial encounter: Secondary | ICD-10-CM | POA: Diagnosis not present

## 2018-01-24 DIAGNOSIS — L97819 Non-pressure chronic ulcer of other part of right lower leg with unspecified severity: Secondary | ICD-10-CM | POA: Diagnosis not present

## 2018-02-01 ENCOUNTER — Encounter (HOSPITAL_BASED_OUTPATIENT_CLINIC_OR_DEPARTMENT_OTHER): Payer: BLUE CROSS/BLUE SHIELD | Attending: Internal Medicine

## 2018-02-01 DIAGNOSIS — R21 Rash and other nonspecific skin eruption: Secondary | ICD-10-CM | POA: Diagnosis not present

## 2018-02-01 DIAGNOSIS — L309 Dermatitis, unspecified: Secondary | ICD-10-CM | POA: Diagnosis not present

## 2018-02-01 DIAGNOSIS — L539 Erythematous condition, unspecified: Secondary | ICD-10-CM | POA: Diagnosis not present

## 2018-02-01 DIAGNOSIS — S81801A Unspecified open wound, right lower leg, initial encounter: Secondary | ICD-10-CM | POA: Diagnosis not present

## 2018-02-14 ENCOUNTER — Encounter (HOSPITAL_BASED_OUTPATIENT_CLINIC_OR_DEPARTMENT_OTHER): Payer: BLUE CROSS/BLUE SHIELD | Attending: Internal Medicine

## 2018-02-14 DIAGNOSIS — L309 Dermatitis, unspecified: Secondary | ICD-10-CM | POA: Diagnosis not present

## 2018-02-14 DIAGNOSIS — L249 Irritant contact dermatitis, unspecified cause: Secondary | ICD-10-CM | POA: Diagnosis not present

## 2018-03-21 DIAGNOSIS — L301 Dyshidrosis [pompholyx]: Secondary | ICD-10-CM | POA: Diagnosis not present

## 2018-03-21 DIAGNOSIS — L309 Dermatitis, unspecified: Secondary | ICD-10-CM | POA: Diagnosis not present

## 2018-03-21 DIAGNOSIS — L03115 Cellulitis of right lower limb: Secondary | ICD-10-CM | POA: Diagnosis not present

## 2018-03-21 DIAGNOSIS — R21 Rash and other nonspecific skin eruption: Secondary | ICD-10-CM | POA: Diagnosis not present

## 2018-03-27 DIAGNOSIS — L03115 Cellulitis of right lower limb: Secondary | ICD-10-CM | POA: Diagnosis not present

## 2018-04-15 DIAGNOSIS — I872 Venous insufficiency (chronic) (peripheral): Secondary | ICD-10-CM | POA: Diagnosis not present

## 2018-04-15 DIAGNOSIS — L03115 Cellulitis of right lower limb: Secondary | ICD-10-CM | POA: Diagnosis not present

## 2018-04-15 DIAGNOSIS — L309 Dermatitis, unspecified: Secondary | ICD-10-CM | POA: Diagnosis not present

## 2018-04-15 DIAGNOSIS — L97211 Non-pressure chronic ulcer of right calf limited to breakdown of skin: Secondary | ICD-10-CM | POA: Diagnosis not present

## 2018-04-18 DIAGNOSIS — L309 Dermatitis, unspecified: Secondary | ICD-10-CM | POA: Diagnosis not present

## 2018-04-18 DIAGNOSIS — I872 Venous insufficiency (chronic) (peripheral): Secondary | ICD-10-CM | POA: Diagnosis not present

## 2018-04-18 DIAGNOSIS — L97211 Non-pressure chronic ulcer of right calf limited to breakdown of skin: Secondary | ICD-10-CM | POA: Diagnosis not present

## 2018-04-22 DIAGNOSIS — I872 Venous insufficiency (chronic) (peripheral): Secondary | ICD-10-CM | POA: Diagnosis not present

## 2018-04-22 DIAGNOSIS — I891 Lymphangitis: Secondary | ICD-10-CM | POA: Diagnosis not present

## 2018-05-06 DIAGNOSIS — L97211 Non-pressure chronic ulcer of right calf limited to breakdown of skin: Secondary | ICD-10-CM | POA: Diagnosis not present

## 2018-05-06 DIAGNOSIS — L309 Dermatitis, unspecified: Secondary | ICD-10-CM | POA: Diagnosis not present

## 2018-05-06 DIAGNOSIS — I872 Venous insufficiency (chronic) (peripheral): Secondary | ICD-10-CM | POA: Diagnosis not present

## 2018-05-10 ENCOUNTER — Other Ambulatory Visit: Payer: Self-pay | Admitting: Family Medicine

## 2018-05-10 DIAGNOSIS — I10 Essential (primary) hypertension: Secondary | ICD-10-CM

## 2018-05-22 DIAGNOSIS — M79671 Pain in right foot: Secondary | ICD-10-CM | POA: Diagnosis not present

## 2018-05-22 DIAGNOSIS — G5762 Lesion of plantar nerve, left lower limb: Secondary | ICD-10-CM | POA: Diagnosis not present

## 2018-05-22 DIAGNOSIS — M21612 Bunion of left foot: Secondary | ICD-10-CM | POA: Diagnosis not present

## 2018-05-22 DIAGNOSIS — M722 Plantar fascial fibromatosis: Secondary | ICD-10-CM | POA: Diagnosis not present

## 2018-05-22 DIAGNOSIS — M21611 Bunion of right foot: Secondary | ICD-10-CM | POA: Diagnosis not present

## 2018-05-22 DIAGNOSIS — M79672 Pain in left foot: Secondary | ICD-10-CM | POA: Diagnosis not present

## 2018-05-27 ENCOUNTER — Encounter: Payer: Self-pay | Admitting: Emergency Medicine

## 2018-05-27 ENCOUNTER — Other Ambulatory Visit: Payer: Self-pay

## 2018-05-27 ENCOUNTER — Ambulatory Visit (INDEPENDENT_AMBULATORY_CARE_PROVIDER_SITE_OTHER): Payer: BLUE CROSS/BLUE SHIELD | Admitting: Emergency Medicine

## 2018-05-27 VITALS — BP 159/80 | HR 73 | Temp 98.8°F | Resp 16 | Ht 68.0 in | Wt 203.8 lb

## 2018-05-27 DIAGNOSIS — L03115 Cellulitis of right lower limb: Secondary | ICD-10-CM | POA: Diagnosis not present

## 2018-05-27 DIAGNOSIS — Z87898 Personal history of other specified conditions: Secondary | ICD-10-CM

## 2018-05-27 LAB — POCT GLYCOSYLATED HEMOGLOBIN (HGB A1C): Hemoglobin A1C: 6.3 % — AB (ref 4.0–5.6)

## 2018-05-27 LAB — GLUCOSE, POCT (MANUAL RESULT ENTRY): POC GLUCOSE: 87 mg/dL (ref 70–99)

## 2018-05-27 MED ORDER — CIPROFLOXACIN HCL 500 MG PO TABS: 500.0000 mg | ORAL_TABLET | Freq: Two times a day (BID) | ORAL | 0 refills | Status: AC

## 2018-05-27 MED ORDER — DOXYCYCLINE HYCLATE 100 MG PO TABS: 100.0000 mg | ORAL_TABLET | Freq: Two times a day (BID) | ORAL | 0 refills | Status: DC

## 2018-05-27 NOTE — Patient Instructions (Addendum)
     If you have lab work done today you will be contacted with your lab results within the next 2 weeks.  If you have not heard from us then please contact us. The fastest way to get your results is to register for My Chart.   IF you received an x-ray today, you will receive an invoice from [facility]. Please contact [facility] at [phone] with questions or concerns regarding your invoice.   IF you received labwork today, you will receive an invoice from [facility]. Please contact [facility] at [phone] with questions or concerns regarding your invoice.   Our billing staff will not be able to assist you with questions regarding bills from these companies.  You will be contacted with the lab results as soon as they are available. The fastest way to get your results is to activate your My Chart account. Instructions are located on the last page of this paperwork. If you have not heard from us regarding the results in 2 weeks, please contact this office.       Cellulitis, Adult Cellulitis is a skin infection. The infected area is usually red and sore. This condition occurs most often in the arms and lower legs. It is very important to get treated for this condition. Follow these instructions at home:  Take over-the-counter and prescription medicines only as told by your doctor.  If you were prescribed an antibiotic medicine, take it as told by your doctor. Do not stop taking the antibiotic even if you start to feel better.  Drink enough fluid to keep your pee (urine) clear or pale yellow.  Do not touch or rub the infected area.  Raise (elevate) the infected area above the level of your heart while you are sitting or lying down.  Place warm or cold wet cloths (warm or cold compresses) on the infected area. Do this as told by your doctor.  Keep all follow-up visits as told by your doctor. This is important. These visits let your doctor make sure your infection is  not getting worse. Contact a doctor if:  You have a fever.  Your symptoms do not get better after 1-2 days of treatment.  Your bone or joint under the infected area starts to hurt after the skin has healed.  Your infection comes back. This can happen in the same area or another area.  You have a swollen bump in the infected area.  You have new symptoms.  You feel ill and also have muscle aches and pains. Get help right away if:  Your symptoms get worse.  You feel very sleepy.  You throw up (vomit) or have watery poop (diarrhea) for a long time.  There are red streaks coming from the infected area.  Your red area gets larger.  Your red area turns darker. This information is not intended to replace advice given to you by your health care provider. Make sure you discuss any questions you have with your health care provider. Document Released: 01/03/2008 Document Revised: 12/23/2015 Document Reviewed: 05/26/2015 Elsevier Interactive Patient Education  2018 Elsevier Inc.  

## 2018-05-27 NOTE — Progress Notes (Signed)
Nathan Roberts 57 y.o.   Chief Complaint  Patient presents with  . Foot Pain    x 1 year - per patient he has a Health Screening at work Sept 2019 and told he has diabetes    HISTORY OF PRESENT ILLNESS: This is a 57 y.o. male with history of chronic infections to both lower legs and prediabetes complaining of redness and pain of right lower leg.  Denies injury or any other associated symptoms. First visit with me. HPI   Prior to Admission medications   Medication Sig Start Date End Date Taking? Authorizing Provider  amLODipine (NORVASC) 10 MG tablet TAKE 1 TABLET BY MOUTH EVERY DAY 05/10/18  Yes Stallings, Zoe A, MD  ammonium lactate (LAC-HYDRIN) 12 % lotion Apply topically See admin instructions. 11/30/17  Yes Arnetha Courser, MD  losartan (COZAAR) 100 MG tablet TAKE 1/2 TABLET (50 MG TOTAL) BY MOUTH DAILY. 05/10/18  Yes Doristine Bosworth, MD  Multiple Vitamins-Minerals (MULTIVITAMIN WITH MINERALS) tablet Take 1 tablet by mouth daily.   Yes [provider]  omega-3 acid ethyl esters (LOVAZA) 1 g capsule Take 1 g by mouth 4 (four) times a week.   Yes [provider]  TURMERIC PO Take 1 tablet by mouth daily.   Yes [provider]  ciprofloxacin (CIPRO) 500 MG tablet Take 1 tablet (500 mg total) by mouth 2 (two) times daily for 10 days. 05/27/18 06/06/18  Georgina Quint, MD  GARLIC PO Take 1 tablet by mouth daily.    [provider]    Allergies  Allergen Reactions  . Latex Hives  . Cephalexin Rash    Patient Active Problem List   Diagnosis Date Noted  . 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 Elevated hemoglobin A1c 11/27/2017  . Transaminitis 11/27/2017  . Elevated LFTs 02/02/2014  . Family history of lung cancer 01/07/2014  . Hyperlipidemia 12/18/2013  . Allergic rhinitis 12/18/2013  . GERD (gastroesophageal reflux disease) 12/18/2013  . Generalized anxiety disorder 12/18/2013  . Chronic sinusitis 02/23/2012  . Persistent headaches 02/23/2012    . Cervical disc disease 11/01/2011  . Hypertension 11/01/2011    Past Medical History:  Diagnosis Date  . Allergy   . Arthritis   . Carpal tunnel syndrome   . GERD (gastroesophageal reflux disease)   . Hyperlipidemia   . Hypertension     Past Surgical History:  Procedure Laterality Date  . abscess     rectal area  . CARPAL TUNNEL RELEASE     left  . CERVICAL FUSION    . SPINE SURGERY      Social History   Socioeconomic History  . Marital status: Married    Spouse name: Not on file  . Number of children: Not on file  . Years of education: Not on file  . Highest education level: Not on file  Occupational History  . Not on file  Social Needs  . Financial resource strain: Not on file  . Food insecurity:    Worry: Not on file    Inability: Not on file  . Transportation needs:    Medical: Not on file    Non-medical: Not on file  Tobacco Use  . Smoking status: Former Smoker    Types: Cigarettes    Last attempt to quit: 12/08/1996    Years since quitting: 21.4  . Smokeless tobacco: Never Used  Substance and Sexual Activity  . Alcohol use: Yes    Alcohol/week: 9.0 standard drinks    Types: 2 Glasses  of wine, 5 Cans of beer, 1 Shots of liquor, 1 Standard drinks or equivalent per week  . Drug use: No  . Sexual activity: Not Currently  Lifestyle  . Physical activity:    Days per week: Not on file    Minutes per session: Not on file  . Stress: Not on file  Relationships  . Social connections:    Talks on phone: Not on file    Gets together: Not on file    Attends religious service: Not on file    Active member of club or organization: Not on file    Attends meetings of clubs or organizations: Not on file    Relationship status: Not on file  . Intimate partner violence:    Fear of current or ex partner: Not on file    Emotionally abused: Not on file    Physically abused: Not on file    Forced sexual activity: Not on file  Other Topics Concern  . Not on file   Social History Narrative  . Not on file    Family History  Problem Relation Age of Onset  . Cancer Mother 22       lung--smoker  . Early death Mother   . Atrial fibrillation Father   . Heart disease Father   . COPD Father   . Hyperlipidemia Father   . Hypertension Father   . Colon cancer Neg Hx   . Esophageal cancer Neg Hx   . Stomach cancer Neg Hx   . Rectal cancer Neg Hx      Review of Systems  Constitutional: Negative.  Negative for fever.  HENT: Negative.  Negative for sore throat.   Eyes: Negative.  Negative for blurred vision and double vision.  Respiratory: Negative.  Negative for cough, shortness of breath and wheezing.   Cardiovascular: Negative.  Negative for chest pain and palpitations.  Gastrointestinal: Negative.  Negative for abdominal pain, diarrhea, nausea and vomiting.  Genitourinary: Negative.   Skin: Positive for rash.  Neurological: Negative.  Negative for dizziness and headaches.  Endo/Heme/Allergies: Negative.   All other systems reviewed and are negative.   Vitals:   05/27/18 1554  BP: (!) 159/80  Pulse: 73  Resp: 16  Temp: 98.8 F (37.1 C)  SpO2: 99%    Physical Exam  Constitutional: He is oriented to person, place, and time. He appears well-developed and well-nourished.  HENT:  Head: Normocephalic and atraumatic.  Eyes: Pupils are equal, round, and reactive to light.  Neck: Normal range of motion.  Cardiovascular: Normal rate and regular rhythm.  Pulmonary/Chest: Effort normal and breath sounds normal.  Neurological: He is alert and oriented to person, place, and time. No sensory deficit. He exhibits normal muscle tone.  Skin: Skin is warm and dry. Capillary refill takes less than 2 seconds. Rash noted.  Psychiatric: He has a normal mood and affect. His behavior is normal. Thought content normal.  Vitals reviewed.      ASSESSMENT & PLAN: Aniel was seen today for foot pain.  Diagnoses and all orders for this  visit:  Cellulitis of right lower leg -     POCT glucose (manual entry) -     POCT glycosylated hemoglobin (Hb A1C) -     Comprehensive metabolic panel -     CBC -     Ambulatory referral to Podiatry -     ciprofloxacin (CIPRO) 500 MG tablet; Take 1 tablet (500 mg total) by mouth 2 (two) times daily for  10 days. -     doxycycline (VIBRA-TABS) 100 MG tablet; Take 1 tablet (100 mg total) by mouth 2 (two) times daily.  History of prediabetes    Patient Instructions       If you have lab work done today you will be contacted with your lab results within the next 2 weeks.  If you have not heard from [place] then please contact us. The fastest way to get your results is to register for My Chart.   IF you received an x-ray today, you will receive an invoice from [facility]. Please contact [facility] at [phone] with questions or concerns regarding your invoice.   IF you received labwork today, you will receive an invoice from [facility]. Please contact [facility] at [phone] with questions or concerns regarding your invoice.   Our billing staff will not be able to assist you with questions regarding bills from these companies.  You will be contacted with the lab results as soon as they are available. The fastest way to get your results is to activate your My Chart account. Instructions are located on the last page of this paperwork. If you have not heard from [place] regarding the results in 2 weeks, please contact this office.     Cellulitis, Adult Cellulitis is a skin infection. The infected area is usually red and sore. This condition occurs most often in the arms and lower legs. It is very important to get treated for this condition. Follow these instructions at home:  Take over-the-counter and prescription medicines only as told by your doctor.  If you were prescribed an antibiotic medicine, take it as told by your doctor. Do not stop taking the antibiotic even if  you start to feel better.  Drink enough fluid to keep your pee (urine) clear or pale yellow.  Do not touch or rub the infected area.  Raise (elevate) the infected area above the level of your heart while you are sitting or lying down.  Place warm or cold wet cloths (warm or cold compresses) on the infected area. Do this as told by your doctor.  Keep all follow-up visits as told by your doctor. This is important. These visits let your doctor make sure your infection is not getting worse. Contact a doctor if:  You have a fever.  Your symptoms do not get better after 1-2 days of treatment.  Your bone or joint under the infected area starts to hurt after the skin has healed.  Your infection comes back. This can happen in the same area or another area.  You have a swollen bump in the infected area.  You have new symptoms.  You feel ill and also have muscle aches and pains. Get help right away if:  Your symptoms get worse.  You feel very sleepy.  You throw up (vomit) or have watery poop (diarrhea) for a long time.  There are red streaks coming from the infected area.  Your red area gets larger.  Your red area turns darker. This information is not intended to replace advice given to you by your health care provider. Make sure you discuss any questions you have with your health care provider. Document Released: 01/03/2008 Document Revised: 12/23/2015 Document Reviewed: 05/26/2015 Elsevier Interactive Patient Education  2018 Elsevier Inc.      Edwina Barth, MD [facility]

## 2018-05-28 LAB — COMPREHENSIVE METABOLIC PANEL
ALK PHOS: 172 IU/L — AB (ref 39–117)
ALT: 36 IU/L (ref 0–44)
AST: 58 IU/L — ABNORMAL HIGH (ref 0–40)
Albumin/Globulin Ratio: 1.2 (ref 1.2–2.2)
Albumin: 4.2 g/dL (ref 3.5–5.5)
BUN / CREAT RATIO: 18 (ref 9–20)
BUN: 13 mg/dL (ref 6–24)
Bilirubin Total: 0.7 mg/dL (ref 0.0–1.2)
CO2: 23 mmol/L (ref 20–29)
CREATININE: 0.73 mg/dL — AB (ref 0.76–1.27)
Calcium: 9.2 mg/dL (ref 8.7–10.2)
Chloride: 96 mmol/L (ref 96–106)
GFR calc Af Amer: 117 mL/min/{1.73_m2} (ref >59)
GFR calc non Af Amer: 102 mL/min/{1.73_m2} (ref >59)
GLOBULIN, TOTAL: 3.4 g/dL (ref 1.5–4.5)
GLUCOSE: 90 mg/dL (ref 65–99)
Potassium: 4 mmol/L (ref 3.5–5.2)
SODIUM: 133 mmol/L — AB (ref 134–144)
TOTAL PROTEIN: 7.6 g/dL (ref 6.0–8.5)

## 2018-05-28 LAB — CBC
Hematocrit: 36.2 % — ABNORMAL LOW (ref 37.5–51.0)
Hemoglobin: 12.2 g/dL — ABNORMAL LOW (ref 13.0–17.7)
MCH: 30.1 pg (ref 26.6–33.0)
MCHC: 33.7 g/dL (ref 31.5–35.7)
MCV: 89 fL (ref 79–97)
PLATELETS: 116 10*3/uL — AB (ref 150–450)
RBC: 4.05 x10E6/uL — ABNORMAL LOW (ref 4.14–5.80)
RDW: 14 % (ref 12.3–15.4)
WBC: 5.8 10*3/uL (ref 3.4–10.8)

## 2018-06-07 DIAGNOSIS — L600 Ingrowing nail: Secondary | ICD-10-CM | POA: Diagnosis not present

## 2018-06-12 ENCOUNTER — Ambulatory Visit: Payer: BLUE CROSS/BLUE SHIELD | Admitting: Podiatry

## 2018-07-02 DIAGNOSIS — T8189XA Other complications of procedures, not elsewhere classified, initial encounter: Secondary | ICD-10-CM | POA: Diagnosis not present

## 2018-07-10 DIAGNOSIS — M19071 Primary osteoarthritis, right ankle and foot: Secondary | ICD-10-CM | POA: Diagnosis not present

## 2018-07-10 DIAGNOSIS — M205X2 Other deformities of toe(s) (acquired), left foot: Secondary | ICD-10-CM | POA: Diagnosis not present

## 2018-07-10 DIAGNOSIS — L03115 Cellulitis of right lower limb: Secondary | ICD-10-CM | POA: Diagnosis not present

## 2018-07-10 DIAGNOSIS — Q6672 Congenital pes cavus, left foot: Secondary | ICD-10-CM | POA: Diagnosis not present

## 2018-07-10 DIAGNOSIS — M7741 Metatarsalgia, right foot: Secondary | ICD-10-CM | POA: Diagnosis not present

## 2018-07-10 DIAGNOSIS — M722 Plantar fascial fibromatosis: Secondary | ICD-10-CM | POA: Diagnosis not present

## 2018-07-10 DIAGNOSIS — M19072 Primary osteoarthritis, left ankle and foot: Secondary | ICD-10-CM | POA: Diagnosis not present

## 2018-07-10 DIAGNOSIS — M7742 Metatarsalgia, left foot: Secondary | ICD-10-CM | POA: Diagnosis not present

## 2018-07-25 ENCOUNTER — Other Ambulatory Visit: Payer: Self-pay

## 2018-07-25 ENCOUNTER — Ambulatory Visit (INDEPENDENT_AMBULATORY_CARE_PROVIDER_SITE_OTHER): Payer: BLUE CROSS/BLUE SHIELD | Admitting: Physician Assistant

## 2018-07-25 ENCOUNTER — Encounter: Payer: Self-pay | Admitting: Physician Assistant

## 2018-07-25 VITALS — BP 150/78 | HR 83 | Temp 98.7°F | Ht 68.0 in | Wt 207.4 lb

## 2018-07-25 DIAGNOSIS — L03115 Cellulitis of right lower limb: Secondary | ICD-10-CM | POA: Diagnosis not present

## 2018-07-25 DIAGNOSIS — F329 Major depressive disorder, single episode, unspecified: Secondary | ICD-10-CM

## 2018-07-25 DIAGNOSIS — R4589 Other symptoms and signs involving emotional state: Secondary | ICD-10-CM

## 2018-07-25 DIAGNOSIS — I10 Essential (primary) hypertension: Secondary | ICD-10-CM | POA: Diagnosis not present

## 2018-07-25 MED ORDER — CIPROFLOXACIN HCL 500 MG PO TABS: 500.0000 mg | ORAL_TABLET | Freq: Two times a day (BID) | ORAL | 0 refills | Status: DC

## 2018-07-25 MED ORDER — DOXYCYCLINE HYCLATE 100 MG PO TABS: 100.0000 mg | ORAL_TABLET | Freq: Two times a day (BID) | ORAL | 0 refills | Status: DC

## 2018-07-25 MED ORDER — LOSARTAN POTASSIUM 100 MG PO TABS: ORAL_TABLET | ORAL | 2 refills | Status: DC

## 2018-07-25 MED ORDER — AMLODIPINE BESYLATE 10 MG PO TABS: 10.0000 mg | ORAL_TABLET | Freq: Every day | ORAL | 1 refills | Status: DC

## 2018-07-25 NOTE — Patient Instructions (Addendum)
Start taking antibiotics twice daily. Take the entire course.  Continue to wear compression socks. Use an exfoliating brush to help remove dead skin.   Come back in 3 weeks to discuss improvement with antibiotics and depression.   Cellulitis, Adult  Cellulitis is a skin infection. The infected area is usually warm, red, swollen, and tender. This condition occurs most often in the arms and lower legs. The infection can travel to the muscles, blood, and underlying tissue and become serious. It is very important to get treated for this condition. What are the causes? Cellulitis is caused by bacteria. The bacteria enter through a break in the skin, such as a cut, burn, insect bite, open sore, or crack. What increases the risk? This condition is more likely to occur in people who:  Have a weak body defense system (immune system).  Have open wounds on the skin, such as cuts, burns, bites, and scrapes. Bacteria can enter the body through these open wounds.  Are older than 59 years of age.  Have diabetes.  Have a type of long-lasting (chronic) liver disease (cirrhosis) or kidney disease.  Are obese.  Have a skin condition such as: ? Itchy rash (eczema). ? Slow movement of blood in the veins (venous stasis). ? Fluid buildup below the skin (edema).  Have had radiation therapy.  Use IV drugs. What are the signs or symptoms? Symptoms of this condition include:  Redness, streaking, or spotting on the skin.  Swollen area of the skin.  Tenderness or pain when an area of the skin is touched.  Warm skin.  A fever.  Chills.  Blisters. How is this diagnosed? This condition is diagnosed based on a medical history and physical exam. You may also have tests, including:  Blood tests.  Imaging tests. How is this treated? Treatment for this condition may include:  Medicines, such as antibiotic medicines or medicines to treat allergies (antihistamines).  Supportive care, such as  rest and application of cold or warm cloths (compresses) to the skin.  Hospital care, if the condition is severe. The infection usually starts to get better within 1-2 days of treatment. Follow these instructions at home:  Medicines  Take over-the-counter and prescription medicines only as told by your health care provider.  If you were prescribed an antibiotic medicine, take it as told by your health care provider. Do not stop taking the antibiotic even if you start to feel better. General instructions  Drink enough fluid to keep your urine pale yellow.  Do not touch or rub the infected area.  Raise (elevate) the infected area above the level of your heart while you are sitting or lying down.  Apply warm or cold compresses to the affected area as told by your health care provider.  Keep all follow-up visits as told by your health care provider. This is important. These visits let your health care provider make sure a more serious infection is not developing. Contact a health care provider if:  You have a fever.  Your symptoms do not begin to improve within 1-2 days of starting treatment.  Your bone or joint underneath the infected area becomes painful after the skin has healed.  Your infection returns in the same area or another area.  You notice a swollen bump in the infected area.  You develop new symptoms.  You have a general ill feeling (malaise) with muscle aches and pains. Get help right away if:  Your symptoms get worse.  You feel very  sleepy.  You develop vomiting or diarrhea that persists.  You notice red streaks coming from the infected area.  Your red area gets larger or turns dark in color. These symptoms may represent a serious problem that is an emergency. Do not wait to see if the symptoms will go away. Get medical help right away. Call your local emergency services (911 in the [place] Do not drive yourself to the hospital. Summary  Cellulitis is a  skin infection. This condition occurs most often in the arms and lower legs.  Treatment for this condition may include medicines, such as antibiotic medicines or antihistamines.  Take over-the-counter and prescription medicines only as told by your health care provider. If you were prescribed an antibiotic medicine, do not stop taking the antibiotic even if you start to feel better.  Contact a health care provider if your symptoms do not begin to improve within 1-2 days of starting treatment or your symptoms get worse.  Keep all follow-up visits as told by your health care provider. This is important. These visits let your health care provider make sure that a more serious infection is not developing. This information is not intended to replace advice given to you by your health care provider. Make sure you discuss any questions you have with your health care provider. Document Released: 04/26/2005 Document Revised: 12/06/2017 Document Reviewed: 12/06/2017 Elsevier Interactive Patient Education  2019 [employer]

## 2018-07-25 NOTE — Progress Notes (Signed)
Nathan Roberts  MRN: [id number] DOB: [birth year]  PCP: Patient, No Pcp Per  Subjective:  Pt is a 57 year old male who presents to clinic for f/u right leg cellulitis.  Last OV for this problem was 10/28 with Dr. Alvy BimlerSagardia. He was referred to podiatry and put on cipro and doxycycline.  He went to podiatry on 12/11, dx with Plantar fascial fibromatosis of both feet.  He is wearing compression socks some of the time. Pt has h/o chronic infections of b/l lower legs. He has seen dermatologist and wound care - wound care helped him the best.  H/o prediabetes.   Lab Results  Component Value Date   HGBA1C 6.3 (A) 05/27/2018   Pt  has a past medical history of Allergy, Arthritis, Carpal tunnel syndrome, GERD (gastroesophageal reflux disease), Hyperlipidemia, and Hypertension.  Would like medication refill of norvasc 10mg  and losartan 100 mg. He's been out for two days. Blood pressure today is 150/78  Endorses depressed mood. Would like to start medication. Has never discussed this with his PCP.    Review of Systems  Constitutional: Negative for chills and fever.  Cardiovascular: Positive for leg swelling (right). Negative for chest pain and palpitations.  Skin: Positive for color change.    Patient Active Problem List   Diagnosis Date Noted  . 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 Elevated hemoglobin A1c 11/27/2017  . Transaminitis 11/27/2017  . Elevated LFTs 02/02/2014  . Family history of lung cancer 01/07/2014  . Hyperlipidemia 12/18/2013  . Allergic rhinitis 12/18/2013  . GERD (gastroesophageal reflux disease) 12/18/2013  . Generalized anxiety disorder 12/18/2013  . Chronic sinusitis 02/23/2012  . Persistent headaches 02/23/2012  . Cervical disc disease 11/01/2011  . Hypertension 11/01/2011    Current Outpatient Medications on File Prior to Visit  Medication Sig Dispense Refill  . amLODipine (NORVASC) 10 MG tablet TAKE 1 TABLET BY MOUTH EVERY DAY 90 tablet 0  . ammonium lactate  (LAC-HYDRIN) 12 % lotion Apply topically See admin instructions. 400 g 0  . GARLIC PO Take 1 tablet by mouth daily.    Marland Kitchen. losartan (COZAAR) 100 MG tablet TAKE 1/2 TABLET (50 MG TOTAL) BY MOUTH DAILY. 45 tablet 0  . Multiple Vitamins-Minerals (MULTIVITAMIN WITH MINERALS) tablet Take 1 tablet by mouth daily.    Marland Kitchen. omega-3 acid ethyl esters (LOVAZA) 1 g capsule Take 1 g by mouth 4 (four) times a week.    . TURMERIC PO Take 1 tablet by mouth daily.     No current facility-administered medications on file prior to visit.     Allergies  Allergen Reactions  . Latex Hives  . Cephalexin Rash     Objective:  BP (!) 150/78 (BP Location: Left Arm, Patient Position: Sitting, Cuff Size: Large)   Pulse 83   Temp 98.7 F (37.1 C) (Oral)   Ht 5\' 8"  (1.727 m)   Wt 207 lb 6.4 oz (94.1 kg)   SpO2 96%   BMI 31.54 kg/m   Physical Exam Vitals signs reviewed.  Constitutional:      Appearance: Normal appearance.  Cardiovascular:     Pulses: Normal pulses. No decreased pulses.  Musculoskeletal:     Right lower leg: He exhibits no tenderness and no swelling. Edema (1+) present.     Right foot: Normal range of motion. No tenderness, bony tenderness or swelling.  Skin:    Comments: Mild erythema and mild warm to touch right LES. No disruption of skin. Some scaling of medial ankle. Pulses present and strong.  Neurological:     Mental Status: He is alert.  Psychiatric:        Mood and Affect: Mood normal.        Behavior: Behavior normal.        Thought Content: Thought content normal.        Judgment: Judgment normal.     Comments: Depressed mood       Assessment and Plan :  1. 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 No red flags. Will start antibiotics. Come back and recheck in 2 weeks. Consider unna boot if needed.   - ciprofloxacin (CIPRO) 500 MG tablet; Take 1 tablet (500 mg total) by mouth 2 (two) times daily.  Dispense: 14 tablet; Refill:  0 - doxycycline (VIBRA-TABS) 100 MG tablet; Take 1 tablet (100 mg total) by mouth 2 (two) times daily.  Dispense: 20 tablet; Refill: 0  2. Depressed mood - Denies SI or HI. He is treatment naive. Advised pt to discuss this with provider who can initiate as well as follow-up with medication. Will have him RTC in 2-3 weeks to discuss treatment options.   3. Essential hypertension - amLODipine (NORVASC) 10 MG tablet; Take 1 tablet (10 mg total) by mouth daily.  Dispense: 90 tablet; Refill: 1 - losartan (COZAAR) 100 MG tablet; TAKE 1/2 TABLET (50 MG TOTAL) BY MOUTH DAILY.  Dispense: 45 tablet; Refill: 2 - pt has been out of HTN medications x 3 days. He is asymptomatic.   Marco CollieWhitney Graysin Luczynski, PA-C  [facility] 07/25/2018 8:40 AM  Please note: Portions of this report may have been transcribed using dragon voice recognition software. Every effort was made to ensure accuracy; however, inadvertent computerized transcription errors may be present.

## 2018-07-30 ENCOUNTER — Ambulatory Visit (INDEPENDENT_AMBULATORY_CARE_PROVIDER_SITE_OTHER): Payer: BLUE CROSS/BLUE SHIELD

## 2018-07-30 ENCOUNTER — Ambulatory Visit (HOSPITAL_COMMUNITY)
Admission: EM | Admit: 2018-07-30 | Discharge: 2018-07-30 | Disposition: A | Discharge status: Home / Self Care | Payer: BLUE CROSS/BLUE SHIELD | Attending: Family Medicine | Admitting: Family Medicine

## 2018-07-30 ENCOUNTER — Encounter (HOSPITAL_COMMUNITY): Payer: Self-pay

## 2018-07-30 DIAGNOSIS — Z5189 Encounter for other specified aftercare: Secondary | ICD-10-CM

## 2018-07-30 DIAGNOSIS — L03115 Cellulitis of right lower limb: Secondary | ICD-10-CM

## 2018-07-30 DIAGNOSIS — I10 Essential (primary) hypertension: Secondary | ICD-10-CM

## 2018-07-30 DIAGNOSIS — S91201A Unspecified open wound of right great toe with damage to nail, initial encounter: Secondary | ICD-10-CM

## 2018-07-30 DIAGNOSIS — S99921A Unspecified injury of right foot, initial encounter: Secondary | ICD-10-CM | POA: Diagnosis not present

## 2018-07-30 LAB — GLUCOSE, CAPILLARY: Glucose-Capillary: 141 mg/dL — ABNORMAL HIGH (ref 70–99)

## 2018-07-30 NOTE — ED Triage Notes (Signed)
Pt states he injured his right foot big toe 6 wks ago and now it will not stop bleeding.

## 2018-07-30 NOTE — ED Provider Notes (Addendum)
[facility]    CSN: [id number] Arrival date & time: 07/30/18  0808     History   Chief Complaint Chief Complaint  Patient presents with  . Wound Check    HPI Sherry RuffingBrian Gledhill is a 57 y.o. male with hx of arthritis, GERD and HTN who presents to the UC with an injury to the right big toe that occurred 6 weeks ago. Patient reports that 3 weeks before the injury he had the nail on his big toe removed and then he took his dog to the dog park and the dog ran and hit the patient's big toe. The area started bleeding after the injury. Patient reports he can't get it to stop. Patient also has cellulitis of the right lower leg that he reports is chronic. He was hospitalized for the infection 5/19 for 4 days and was treated with IV antibiotics. Patient reports he uses cream on the area and it looks the same as it has for several months. He denies fever, chills or other problems.   HPI  Past Medical History:  Diagnosis Date  . Allergy   . Arthritis   . Carpal tunnel syndrome   . GERD (gastroesophageal reflux disease)   . Hyperlipidemia   . Hypertension     Patient Active Problem List   Diagnosis Date Noted  . 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 Elevated hemoglobin A1c 11/27/2017  . Transaminitis 11/27/2017  . Elevated LFTs 02/02/2014  . Family history of lung cancer 01/07/2014  . Hyperlipidemia 12/18/2013  . Allergic rhinitis 12/18/2013  . GERD (gastroesophageal reflux disease) 12/18/2013  . Generalized anxiety disorder 12/18/2013  . Chronic sinusitis 02/23/2012  . Persistent headaches 02/23/2012  . Cervical disc disease 11/01/2011  . Hypertension 11/01/2011    Past Surgical History:  Procedure Laterality Date  . abscess     rectal area  . CARPAL TUNNEL RELEASE     left  . CERVICAL FUSION    . SPINE SURGERY         Home Medications    Prior to Admission medications   Medication Sig Start Date End Date Taking? Authorizing Provider  amLODipine (NORVASC) 10 MG  tablet Take 1 tablet (10 mg total) by mouth daily. 07/25/18   McVey, Madelaine BhatElizabeth Whitney, PA-C  ammonium lactate (LAC-HYDRIN) 12 % lotion Apply topically See admin instructions. 11/30/17   Arnetha CourserAmin, Sumayya, MD  ciprofloxacin (CIPRO) 500 MG tablet Take 1 tablet (500 mg total) by mouth 2 (two) times daily. 07/25/18   McVey, Madelaine BhatElizabeth Whitney, PA-C  doxycycline (VIBRA-TABS) 100 MG tablet Take 1 tablet (100 mg total) by mouth 2 (two) times daily. 07/25/18   McVey, Madelaine BhatElizabeth Whitney, PA-C  GARLIC PO Take 1 tablet by mouth daily.    [provider]  losartan (COZAAR) 100 MG tablet TAKE 1/2 TABLET (50 MG TOTAL) BY MOUTH DAILY. 07/25/18   McVey, Madelaine BhatElizabeth Whitney, PA-C  Multiple Vitamins-Minerals (MULTIVITAMIN WITH MINERALS) tablet Take 1 tablet by mouth daily.    [provider]  omega-3 acid ethyl esters (LOVAZA) 1 g capsule Take 1 g by mouth 4 (four) times a week.    [provider]  TURMERIC PO Take 1 tablet by mouth daily.    [provider]    Family History Family History  Problem Relation Age of Onset  . Cancer Mother 4042       lung--smoker  . Early death Mother   . Atrial fibrillation Father   . Heart disease Father   . COPD Father   .  Hyperlipidemia Father   . Hypertension Father   . Colon cancer Neg Hx   . Esophageal cancer Neg Hx   . Stomach cancer Neg Hx   . Rectal cancer Neg Hx     Social History Social History   Tobacco Use  . Smoking status: Former Smoker    Types: Cigarettes    Last attempt to quit: 12/08/1996    Years since quitting: 21.6  . Smokeless tobacco: Never Used  Substance Use Topics  . Alcohol use: Yes    Alcohol/week: 9.0 standard drinks    Types: 2 Glasses of wine, 5 Cans of beer, 1 Shots of liquor, 1 Standard drinks or equivalent per week  . Drug use: No     Allergies   Latex and Cephalexin   Review of Systems Review of Systems  Skin: Positive for color change and wound.  All other systems reviewed and are  negative.    Physical Exam Triage Vital Signs ED Triage Vitals  Enc Vitals Group     BP 07/30/18 0833 (!) 174/94     Pulse Rate 07/30/18 0833 88     Resp 07/30/18 0833 16     Temp 07/30/18 0833 98.4 F (36.9 C)     Temp Source 07/30/18 0833 Oral     SpO2 07/30/18 0833 97 %     Weight --      Height --      Head Circumference --      Peak Flow --      Pain Score 07/30/18 0834 8     Pain Loc --      Pain Edu? --      Excl. in GC? --    No data found.  Updated Vital Signs BP (!) 178/101 (BP Location: Left Arm)   Pulse 88   Temp 98.4 F (36.9 C) (Oral)   Resp 16   SpO2 97%   Visual Acuity Right Eye Distance:   Left Eye Distance:   Bilateral Distance:    Right Eye Near:   Left Eye Near:    Bilateral Near:     Physical Exam Vitals signs and nursing note reviewed.  Constitutional:      General: He is not in acute distress.    Appearance: He is well-developed.  HENT:     Head: Normocephalic.     Nose: Nose normal.  Eyes:     Conjunctiva/sclera: Conjunctivae normal.  Neck:     Musculoskeletal: Neck supple.  Cardiovascular:     Rate and Rhythm: Normal rate.  Pulmonary:     Effort: Pulmonary effort is normal.  Musculoskeletal: Normal range of motion.       Feet:     Comments: Right great toe with nail removed, thin layer of skin over the toe where the nail was removed. No bleeding at this time. The lower leg has swelling and erythema which the patient reports is chronic and that there has been no change in that area for months. Pedal pulse palpated.   Skin:    General: Skin is warm and dry.  Neurological:     Mental Status: He is alert and oriented to person, place, and time.  Psychiatric:        Mood and Affect: Mood normal.      UC Treatments / Results  Labs (all labs ordered are listed, but only abnormal results are displayed) Labs Reviewed  GLUCOSE, CAPILLARY - Abnormal; Notable for the following components:  Result Value   Glucose-Capillary  141 (*)    All other components within normal limits  CBG MONITORING, ED    Radiology Dg Toe Great Right  Result Date: 07/30/2018 CLINICAL DATA:  Right great toe injury. Right great toe nail removed 6 weeks ago. EXAM: RIGHT GREAT TOE COMPARISON:  None. FINDINGS: There is no evidence of fracture or dislocation. There is no evidence of arthropathy or other focal bone abnormality. Soft tissues are unremarkable. IMPRESSION: Negative. Electronically Signed   By: Elige KoHetal  Patel   On: 07/30/2018 09:39    Procedures: nonstick dressing applied and coban applied loosely. Patient to change dressing daily. Procedures (including critical care time)  Medications Ordered in UC Medications - No data to display  Initial Impression / Assessment and Plan / UC Course  I have reviewed the triage vital signs and the nursing notes. 57 y.o. male here for wound check where he had a nail removed 6 weeks ago and then injured the toe 3 weeks ago. Patient stable for d/c without bleeding at this time and normal x-ray. Dressing applied and patient to f/u with his doctor for recheck.  Final Clinical Impressions(s) / UC Diagnoses   Final diagnoses:  Visit for wound check  addressed elevated BP with the patient and need for f/u. Patient agrees with plan.    Discharge Instructions     Change the dressing daily. Follow up with your foot doctor for recheck.     ED Prescriptions    None     Controlled Substance Prescriptions [place] Controlled Substance Registry consulted? Not Applicable   Janne Napoleoneese, Victoria Euceda M, NP 07/30/18 1017    Kerrie Buffaloeese, Cylinda Santoli KoppelM, [place]NP 07/30/18 1018

## 2018-07-30 NOTE — Discharge Instructions (Signed)
Change the dressing daily. Follow up with your foot doctor for recheck.

## 2018-11-07 DIAGNOSIS — M25774 Osteophyte, right foot: Secondary | ICD-10-CM | POA: Diagnosis not present

## 2018-11-07 DIAGNOSIS — D485 Neoplasm of uncertain behavior of skin: Secondary | ICD-10-CM | POA: Diagnosis not present

## 2018-11-07 DIAGNOSIS — M71571 Other bursitis, not elsewhere classified, right ankle and foot: Secondary | ICD-10-CM | POA: Diagnosis not present

## 2018-11-08 DIAGNOSIS — L980 Pyogenic granuloma: Secondary | ICD-10-CM | POA: Diagnosis not present

## 2018-11-29 DIAGNOSIS — M25571 Pain in right ankle and joints of right foot: Secondary | ICD-10-CM | POA: Diagnosis not present

## 2018-11-29 DIAGNOSIS — M7751 Other enthesopathy of right foot: Secondary | ICD-10-CM | POA: Diagnosis not present

## 2018-11-29 DIAGNOSIS — M25775 Osteophyte, left foot: Secondary | ICD-10-CM | POA: Diagnosis not present

## 2018-11-29 DIAGNOSIS — M71572 Other bursitis, not elsewhere classified, left ankle and foot: Secondary | ICD-10-CM | POA: Diagnosis not present

## 2018-12-13 DIAGNOSIS — M25571 Pain in right ankle and joints of right foot: Secondary | ICD-10-CM | POA: Diagnosis not present

## 2018-12-13 DIAGNOSIS — M25774 Osteophyte, right foot: Secondary | ICD-10-CM | POA: Diagnosis not present

## 2018-12-14 ENCOUNTER — Other Ambulatory Visit: Payer: Self-pay | Admitting: Physician Assistant

## 2018-12-14 DIAGNOSIS — I10 Essential (primary) hypertension: Secondary | ICD-10-CM

## 2018-12-14 NOTE — Telephone Encounter (Signed)
Requested Prescriptions  Pending Prescriptions Disp Refills  . amLODipine (NORVASC) 10 MG tablet [Pharmacy Med Name: AMLODIPINE BESYLATE 10 MG TAB] 90 tablet 0    Sig: TAKE 1 TABLET BY MOUTH EVERY DAY     Cardiovascular:  Calcium Channel Blockers Failed - 12/14/2018 12:49 PM      Failed - Last BP in normal range    BP Readings from Last 1 Encounters:  07/30/18 (!) 178/101         Passed - Valid encounter within last 6 months    Recent Outpatient Visits          4 months ago Cellulitis of right lower extremity   Primary Care at [facility], [place], PA-C   6 months ago Cellulitis of right lower leg   Primary Care at [facility], Eilleen Kempf, MD   1 year ago Cellulitis of right lower extremity   Primary Care at [facility], Manus Rudd, MD   1 year ago Essential hypertension   Primary Care at [facility] Manus Rudd, MD   4 years ago Viral URI with cough   [facility] at [facility], Dema Severin, PA-C

## 2018-12-20 ENCOUNTER — Telehealth: Payer: Self-pay | Admitting: Family Medicine

## 2018-12-20 NOTE — Telephone Encounter (Signed)
Pt called needing a refill  Om losartin 100mg / drug store filled 50mg  so he ran out earlier.tried to get him to do TOC but pt doesn't have the time  . Can we refill his losartin?

## 2019-01-09 DIAGNOSIS — M25774 Osteophyte, right foot: Secondary | ICD-10-CM | POA: Diagnosis not present

## 2019-01-09 DIAGNOSIS — M71571 Other bursitis, not elsewhere classified, right ankle and foot: Secondary | ICD-10-CM | POA: Diagnosis not present

## 2019-01-09 DIAGNOSIS — Z01812 Encounter for preprocedural laboratory examination: Secondary | ICD-10-CM | POA: Diagnosis not present

## 2019-01-09 DIAGNOSIS — Z79899 Other long term (current) drug therapy: Secondary | ICD-10-CM | POA: Diagnosis not present

## 2019-01-10 DIAGNOSIS — I10 Essential (primary) hypertension: Secondary | ICD-10-CM | POA: Diagnosis not present

## 2019-01-10 DIAGNOSIS — R7303 Prediabetes: Secondary | ICD-10-CM | POA: Diagnosis not present

## 2019-01-10 DIAGNOSIS — G4700 Insomnia, unspecified: Secondary | ICD-10-CM | POA: Diagnosis not present

## 2019-01-10 DIAGNOSIS — R600 Localized edema: Secondary | ICD-10-CM | POA: Diagnosis not present

## 2019-01-21 DIAGNOSIS — M25774 Osteophyte, right foot: Secondary | ICD-10-CM | POA: Diagnosis not present

## 2019-01-24 DIAGNOSIS — M12271 Villonodular synovitis (pigmented), right ankle and foot: Secondary | ICD-10-CM | POA: Diagnosis not present

## 2019-02-05 DIAGNOSIS — M12271 Villonodular synovitis (pigmented), right ankle and foot: Secondary | ICD-10-CM | POA: Diagnosis not present

## 2019-03-13 ENCOUNTER — Other Ambulatory Visit: Payer: Self-pay | Admitting: Physician Assistant

## 2019-03-13 NOTE — Telephone Encounter (Signed)
Forwarding medication refill request to the clinical pool for review. 

## 2019-03-17 NOTE — Telephone Encounter (Signed)
Refilled medication for 30 days, 0 refills. Pt has an upcomming OV. 04/03/19.

## 2019-03-21 ENCOUNTER — Telehealth: Payer: Self-pay | Admitting: Family Medicine

## 2019-03-21 DIAGNOSIS — I10 Essential (primary) hypertension: Secondary | ICD-10-CM

## 2019-03-21 MED ORDER — LOSARTAN POTASSIUM 100 MG PO TABS: ORAL_TABLET | ORAL | 2 refills | Status: DC

## 2019-03-21 NOTE — Telephone Encounter (Signed)
Please notify the patient that there was a Retail banker. I sent it in today.

## 2019-03-21 NOTE — Telephone Encounter (Signed)
-----   Message from Willy Eddy, RN sent at 03/18/2019 10:18 PM EDT ----- Regarding: Epic error Good evening Dr. Nolon Rod,  Do to an issue with Epic the refill order did not go through or was auto cancelled.  We are working with Epic and hope to have this issue resolved by 03/20/19.  We apologize for any inconvenience this may cause.  Please place a new order for the medication.  LOSARTAN POTASSIUM 25 MG PO TABS   Thank you,  Zebedee Iba, RN, BSN Ambulatory Analyst

## 2019-03-24 NOTE — Telephone Encounter (Signed)
Pt notified medication losartan 25 mg #45 with 2 refills sent to pharmacy.  Pt appreciative and asked date of next appt and given 04/03/2019 at 2:40 pm. Dgaddy, CMA

## 2019-04-03 ENCOUNTER — Encounter: Payer: BLUE CROSS/BLUE SHIELD | Admitting: Family Medicine

## 2019-04-03 ENCOUNTER — Encounter

## 2019-04-14 ENCOUNTER — Other Ambulatory Visit: Payer: Self-pay | Admitting: Family Medicine

## 2019-04-14 NOTE — Telephone Encounter (Signed)
Requested medication (s) are due for refill today: yes  Requested medication (s) are on the active medication list: yes  Future visit scheduled: no  Notes to clinic:  Review for refill   Requested Prescriptions  Pending Prescriptions Disp Refills   losartan (COZAAR) 25 MG tablet [Pharmacy Med Name: LOSARTAN POTASSIUM 25 MG TAB] 60 tablet 0    Sig: TAKE 2 TABLETS BY MOUTH EVERY DAY     Cardiovascular:  Angiotensin Receptor Blockers Failed - 04/14/2019  9:30 AM      Failed - Cr in normal range and within 180 days    Creat  Date Value Ref Range Status  01/07/2014 0.83 0.50 - 1.35 mg/dL Final   Creatinine, Ser  Date Value Ref Range Status  05/27/2018 0.73 (L) 0.76 - 1.27 mg/dL Final         Failed - K in normal range and within 180 days    Potassium  Date Value Ref Range Status  05/27/2018 4.0 3.5 - 5.2 mmol/L Final         Failed - Last BP in normal range    BP Readings from Last 1 Encounters:  07/30/18 (!) 178/101         Failed - Valid encounter within last 6 months    Recent Outpatient Visits          8 months ago Cellulitis of right lower extremity   Primary Care at [facility], [place], PA-C   10 months ago Cellulitis of right lower leg   Primary Care at [facility] [place], Ines Bloomer, MD   1 year ago Cellulitis of right lower extremity   Primary Care at [place], MD   1 year ago Essential hypertension   Primary Care at [place], MD   4 years ago Viral URI with cough   Primary Care at [place], PA-C             Passed - Patient is not pregnant

## 2019-04-24 ENCOUNTER — Other Ambulatory Visit: Payer: Self-pay | Admitting: Family Medicine

## 2019-06-18 DIAGNOSIS — Z23 Encounter for immunization: Secondary | ICD-10-CM | POA: Diagnosis not present

## 2019-06-18 DIAGNOSIS — F331 Major depressive disorder, recurrent, moderate: Secondary | ICD-10-CM | POA: Diagnosis not present

## 2019-06-18 DIAGNOSIS — R7989 Other specified abnormal findings of blood chemistry: Secondary | ICD-10-CM | POA: Diagnosis not present

## 2019-06-18 DIAGNOSIS — Z125 Encounter for screening for malignant neoplasm of prostate: Secondary | ICD-10-CM | POA: Diagnosis not present

## 2019-06-18 DIAGNOSIS — I10 Essential (primary) hypertension: Secondary | ICD-10-CM | POA: Diagnosis not present

## 2019-06-18 DIAGNOSIS — Z0000 Encounter for general adult medical examination without abnormal findings: Secondary | ICD-10-CM | POA: Diagnosis not present

## 2019-07-07 DIAGNOSIS — M545 Low back pain: Secondary | ICD-10-CM | POA: Diagnosis not present

## 2019-07-07 DIAGNOSIS — M9903 Segmental and somatic dysfunction of lumbar region: Secondary | ICD-10-CM | POA: Diagnosis not present

## 2019-07-07 DIAGNOSIS — M9901 Segmental and somatic dysfunction of cervical region: Secondary | ICD-10-CM | POA: Diagnosis not present

## 2019-07-07 DIAGNOSIS — M9905 Segmental and somatic dysfunction of pelvic region: Secondary | ICD-10-CM | POA: Diagnosis not present

## 2019-07-09 DIAGNOSIS — M9901 Segmental and somatic dysfunction of cervical region: Secondary | ICD-10-CM | POA: Diagnosis not present

## 2019-07-09 DIAGNOSIS — M545 Low back pain: Secondary | ICD-10-CM | POA: Diagnosis not present

## 2019-07-09 DIAGNOSIS — M9903 Segmental and somatic dysfunction of lumbar region: Secondary | ICD-10-CM | POA: Diagnosis not present

## 2019-07-09 DIAGNOSIS — M9905 Segmental and somatic dysfunction of pelvic region: Secondary | ICD-10-CM | POA: Diagnosis not present

## 2019-07-11 DIAGNOSIS — M9901 Segmental and somatic dysfunction of cervical region: Secondary | ICD-10-CM | POA: Diagnosis not present

## 2019-07-11 DIAGNOSIS — M9903 Segmental and somatic dysfunction of lumbar region: Secondary | ICD-10-CM | POA: Diagnosis not present

## 2019-07-11 DIAGNOSIS — R7303 Prediabetes: Secondary | ICD-10-CM | POA: Diagnosis not present

## 2019-07-11 DIAGNOSIS — M9905 Segmental and somatic dysfunction of pelvic region: Secondary | ICD-10-CM | POA: Diagnosis not present

## 2019-07-11 DIAGNOSIS — M545 Low back pain: Secondary | ICD-10-CM | POA: Diagnosis not present

## 2019-07-14 DIAGNOSIS — M9903 Segmental and somatic dysfunction of lumbar region: Secondary | ICD-10-CM | POA: Diagnosis not present

## 2019-07-14 DIAGNOSIS — M545 Low back pain: Secondary | ICD-10-CM | POA: Diagnosis not present

## 2019-07-14 DIAGNOSIS — M9905 Segmental and somatic dysfunction of pelvic region: Secondary | ICD-10-CM | POA: Diagnosis not present

## 2019-07-14 DIAGNOSIS — M9901 Segmental and somatic dysfunction of cervical region: Secondary | ICD-10-CM | POA: Diagnosis not present

## 2019-07-15 DIAGNOSIS — M9905 Segmental and somatic dysfunction of pelvic region: Secondary | ICD-10-CM | POA: Diagnosis not present

## 2019-07-15 DIAGNOSIS — M545 Low back pain: Secondary | ICD-10-CM | POA: Diagnosis not present

## 2019-07-15 DIAGNOSIS — M9903 Segmental and somatic dysfunction of lumbar region: Secondary | ICD-10-CM | POA: Diagnosis not present

## 2019-07-15 DIAGNOSIS — M9901 Segmental and somatic dysfunction of cervical region: Secondary | ICD-10-CM | POA: Diagnosis not present

## 2019-07-21 DIAGNOSIS — M545 Low back pain: Secondary | ICD-10-CM | POA: Diagnosis not present

## 2019-07-21 DIAGNOSIS — M9903 Segmental and somatic dysfunction of lumbar region: Secondary | ICD-10-CM | POA: Diagnosis not present

## 2019-07-21 DIAGNOSIS — M9901 Segmental and somatic dysfunction of cervical region: Secondary | ICD-10-CM | POA: Diagnosis not present

## 2019-07-21 DIAGNOSIS — M9905 Segmental and somatic dysfunction of pelvic region: Secondary | ICD-10-CM | POA: Diagnosis not present

## 2019-07-23 DIAGNOSIS — M9903 Segmental and somatic dysfunction of lumbar region: Secondary | ICD-10-CM | POA: Diagnosis not present

## 2019-07-23 DIAGNOSIS — M9901 Segmental and somatic dysfunction of cervical region: Secondary | ICD-10-CM | POA: Diagnosis not present

## 2019-07-23 DIAGNOSIS — M545 Low back pain: Secondary | ICD-10-CM | POA: Diagnosis not present

## 2019-07-23 DIAGNOSIS — M9905 Segmental and somatic dysfunction of pelvic region: Secondary | ICD-10-CM | POA: Diagnosis not present

## 2019-07-24 DIAGNOSIS — M9901 Segmental and somatic dysfunction of cervical region: Secondary | ICD-10-CM | POA: Diagnosis not present

## 2019-07-24 DIAGNOSIS — M9903 Segmental and somatic dysfunction of lumbar region: Secondary | ICD-10-CM | POA: Diagnosis not present

## 2019-07-24 DIAGNOSIS — M545 Low back pain: Secondary | ICD-10-CM | POA: Diagnosis not present

## 2019-07-24 DIAGNOSIS — M9905 Segmental and somatic dysfunction of pelvic region: Secondary | ICD-10-CM | POA: Diagnosis not present

## 2019-07-28 DIAGNOSIS — M9903 Segmental and somatic dysfunction of lumbar region: Secondary | ICD-10-CM | POA: Diagnosis not present

## 2019-07-28 DIAGNOSIS — M9905 Segmental and somatic dysfunction of pelvic region: Secondary | ICD-10-CM | POA: Diagnosis not present

## 2019-07-28 DIAGNOSIS — M9901 Segmental and somatic dysfunction of cervical region: Secondary | ICD-10-CM | POA: Diagnosis not present

## 2019-07-28 DIAGNOSIS — M545 Low back pain: Secondary | ICD-10-CM | POA: Diagnosis not present

## 2019-07-30 DIAGNOSIS — M9901 Segmental and somatic dysfunction of cervical region: Secondary | ICD-10-CM | POA: Diagnosis not present

## 2019-07-30 DIAGNOSIS — M9905 Segmental and somatic dysfunction of pelvic region: Secondary | ICD-10-CM | POA: Diagnosis not present

## 2019-07-30 DIAGNOSIS — M545 Low back pain: Secondary | ICD-10-CM | POA: Diagnosis not present

## 2019-07-30 DIAGNOSIS — M9903 Segmental and somatic dysfunction of lumbar region: Secondary | ICD-10-CM | POA: Diagnosis not present

## 2019-08-04 DIAGNOSIS — M9903 Segmental and somatic dysfunction of lumbar region: Secondary | ICD-10-CM | POA: Diagnosis not present

## 2019-08-04 DIAGNOSIS — M9905 Segmental and somatic dysfunction of pelvic region: Secondary | ICD-10-CM | POA: Diagnosis not present

## 2019-08-04 DIAGNOSIS — M545 Low back pain: Secondary | ICD-10-CM | POA: Diagnosis not present

## 2019-08-04 DIAGNOSIS — M9901 Segmental and somatic dysfunction of cervical region: Secondary | ICD-10-CM | POA: Diagnosis not present

## 2019-11-05 DIAGNOSIS — I10 Essential (primary) hypertension: Secondary | ICD-10-CM | POA: Diagnosis not present

## 2019-11-05 DIAGNOSIS — F331 Major depressive disorder, recurrent, moderate: Secondary | ICD-10-CM | POA: Diagnosis not present

## 2019-11-05 DIAGNOSIS — R945 Abnormal results of liver function studies: Secondary | ICD-10-CM | POA: Diagnosis not present

## 2019-11-05 DIAGNOSIS — R7303 Prediabetes: Secondary | ICD-10-CM | POA: Diagnosis not present

## 2019-12-31 DIAGNOSIS — R748 Abnormal levels of other serum enzymes: Secondary | ICD-10-CM | POA: Diagnosis not present

## 2019-12-31 DIAGNOSIS — R7303 Prediabetes: Secondary | ICD-10-CM | POA: Diagnosis not present

## 2019-12-31 DIAGNOSIS — M2550 Pain in unspecified joint: Secondary | ICD-10-CM | POA: Diagnosis not present

## 2019-12-31 DIAGNOSIS — I10 Essential (primary) hypertension: Secondary | ICD-10-CM | POA: Diagnosis not present

## 2020-01-01 DIAGNOSIS — M722 Plantar fascial fibromatosis: Secondary | ICD-10-CM | POA: Diagnosis not present

## 2020-01-01 DIAGNOSIS — M79671 Pain in right foot: Secondary | ICD-10-CM | POA: Diagnosis not present

## 2020-01-01 DIAGNOSIS — M79672 Pain in left foot: Secondary | ICD-10-CM | POA: Diagnosis not present

## 2020-01-09 DIAGNOSIS — M722 Plantar fascial fibromatosis: Secondary | ICD-10-CM | POA: Diagnosis not present

## 2020-01-14 DIAGNOSIS — M79671 Pain in right foot: Secondary | ICD-10-CM | POA: Diagnosis not present

## 2020-01-14 DIAGNOSIS — M79672 Pain in left foot: Secondary | ICD-10-CM | POA: Diagnosis not present

## 2020-01-14 DIAGNOSIS — M25572 Pain in left ankle and joints of left foot: Secondary | ICD-10-CM | POA: Diagnosis not present

## 2020-01-14 DIAGNOSIS — M25571 Pain in right ankle and joints of right foot: Secondary | ICD-10-CM | POA: Diagnosis not present

## 2020-01-19 ENCOUNTER — Ambulatory Visit: Payer: BC Managed Care – PPO | Admitting: Podiatry

## 2020-01-20 IMAGING — DX DG ANKLE COMPLETE 3+V*R*
3 series · 3 of 3 positions shown · non-contrast
Comparison: None.

CLINICAL DATA: Pre diabetes. Right foot and ankle pain and swelling
with weeping wound.

EXAM:
RIGHT ANKLE - COMPLETE 3+ VIEW

[ankle ap]
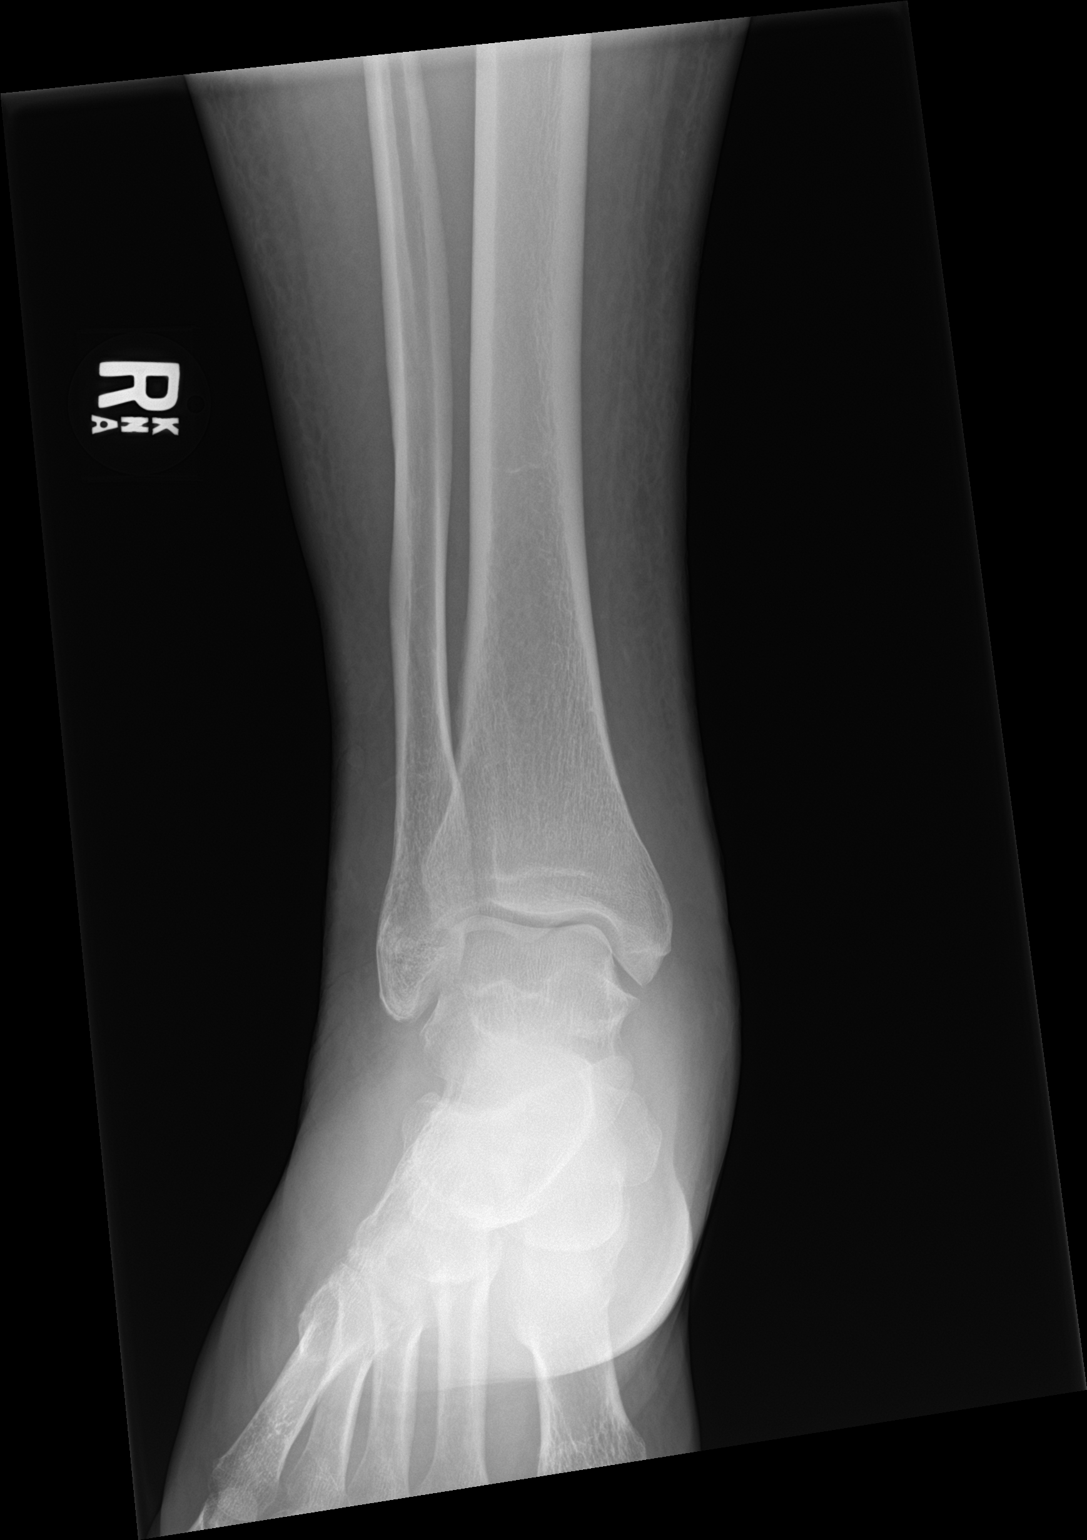

[ankle obl]
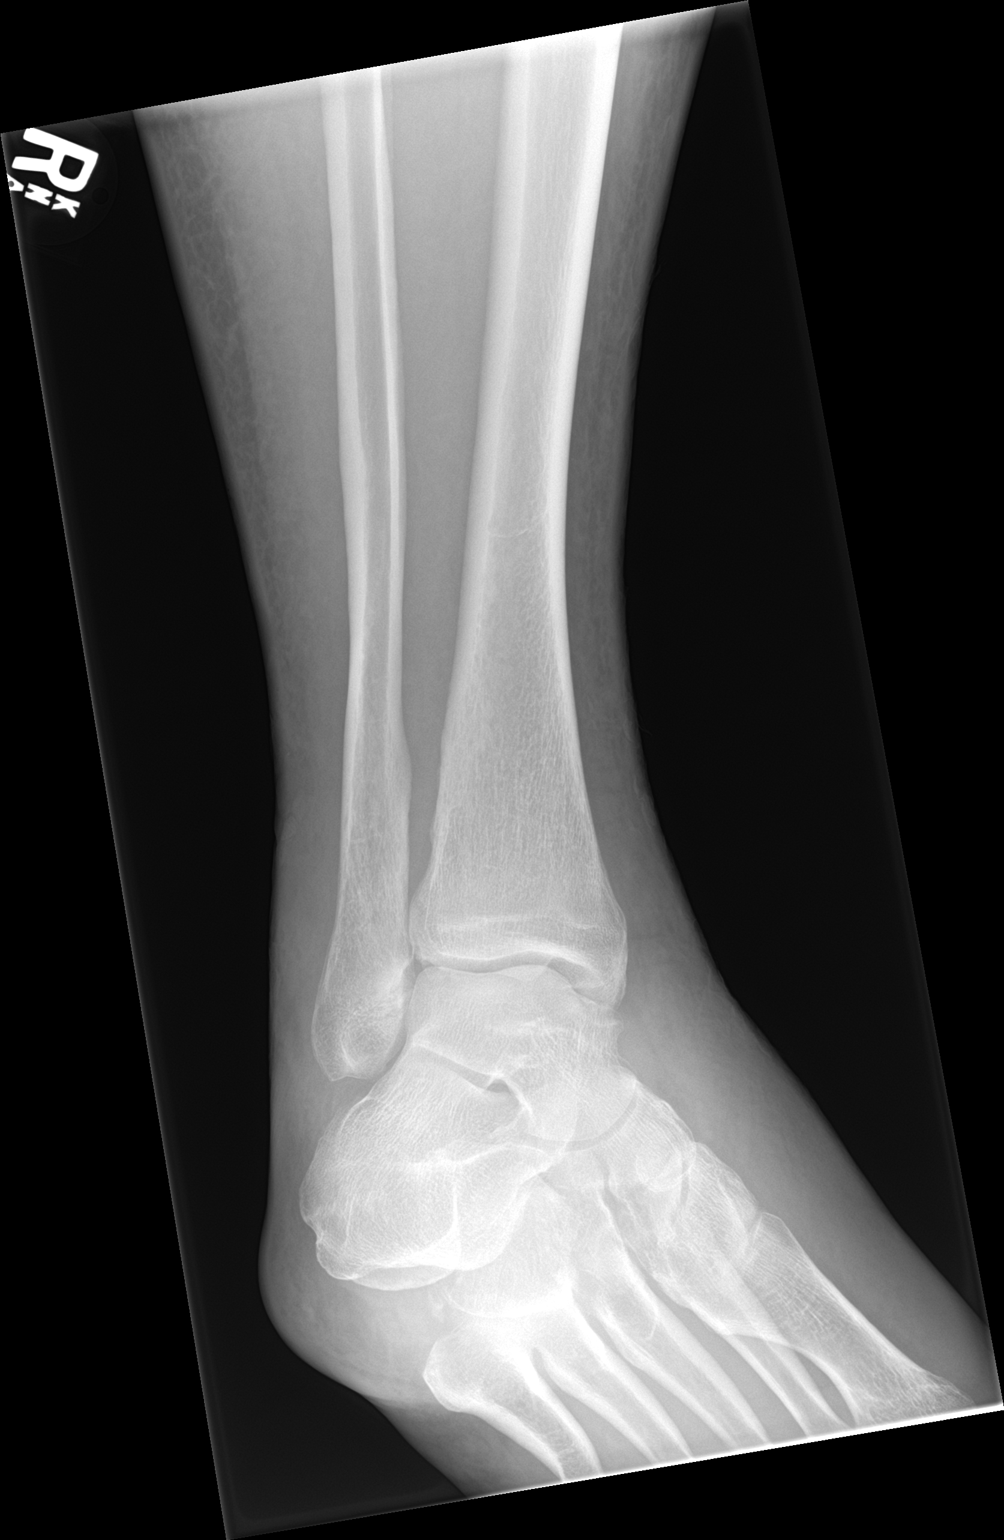

[ankle lat]
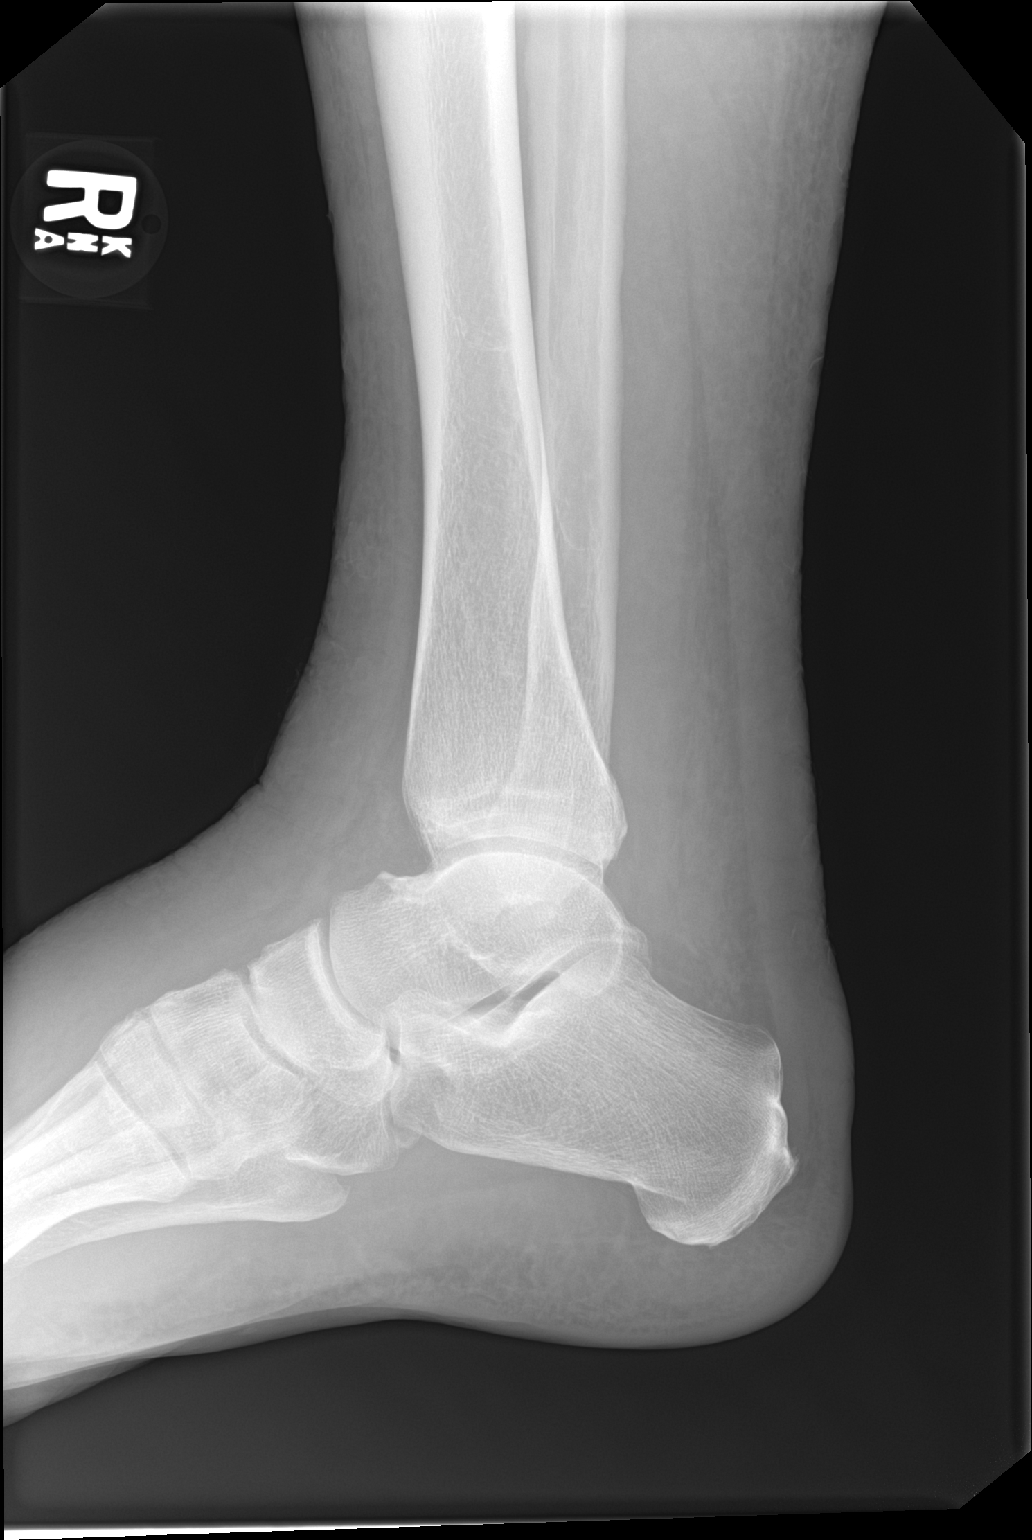

[3 of 3 positions shown; findings below may reference images not displayed]

FINDINGS: The bones appear mildly demineralized. There is no evidence of acute
fracture, dislocation or bone destruction. The joint spaces are
preserved. There is diffuse soft tissue edema, especially over the
dorsum of the foot. No foreign body or definite soft tissue
emphysema.
IMPRESSION: Soft tissue edema/swelling without evidence of osteomyelitis.

## 2020-01-20 IMAGING — DX DG FOOT COMPLETE 3+V*R*
3 series · 3 of 3 positions shown · non-contrast
Comparison: None.

CLINICAL DATA: Pre diabetes. Right foot and ankle pain and swelling
with weeping wound.

EXAM:
RIGHT FOOT COMPLETE - 3+ VIEW

[foot ap]
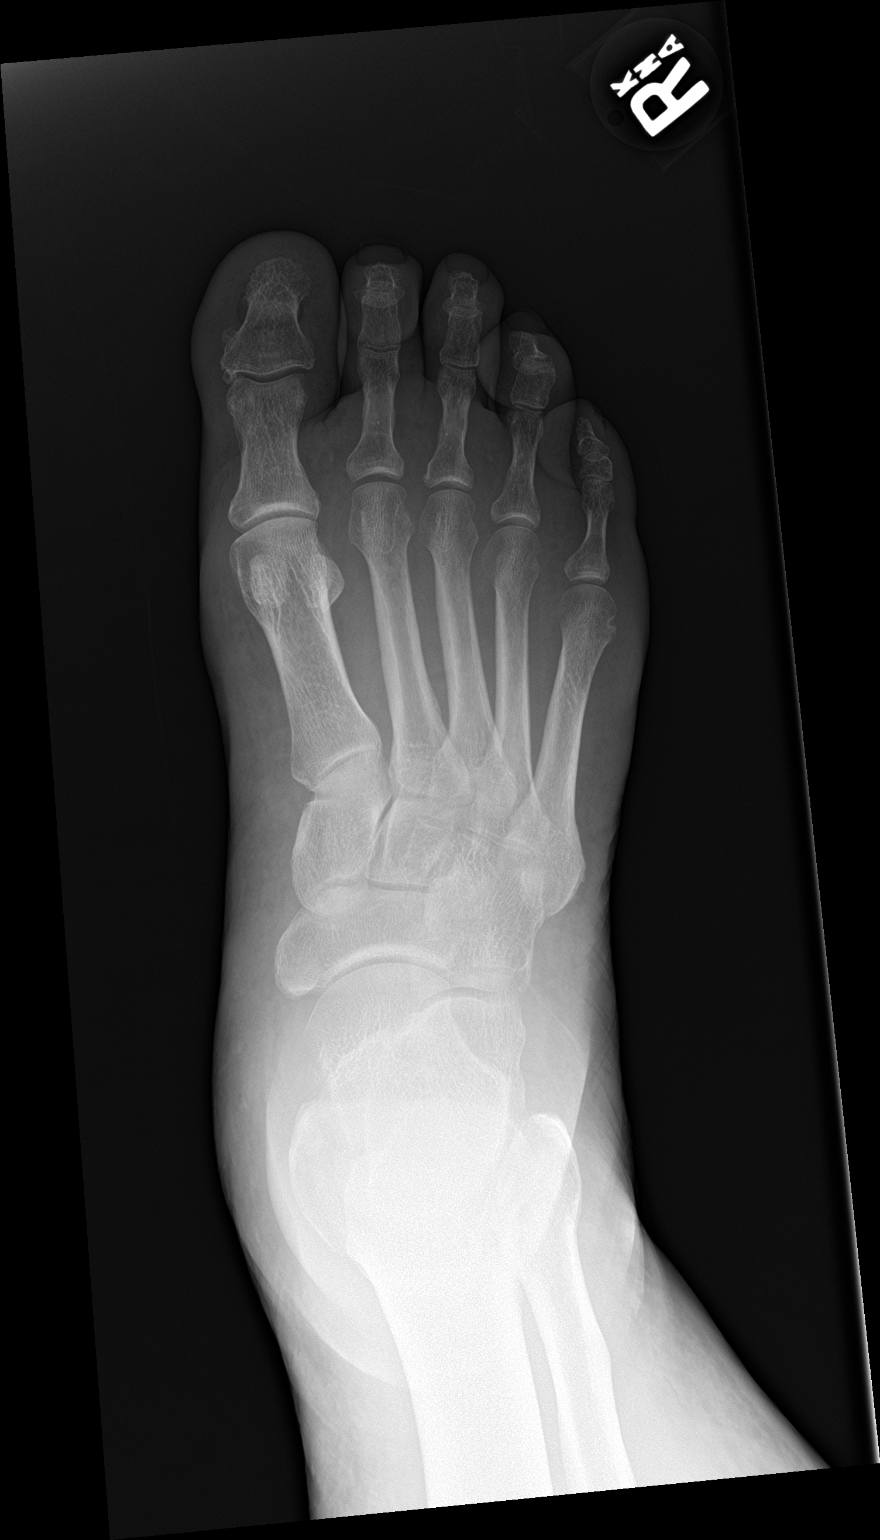

[foot obl]
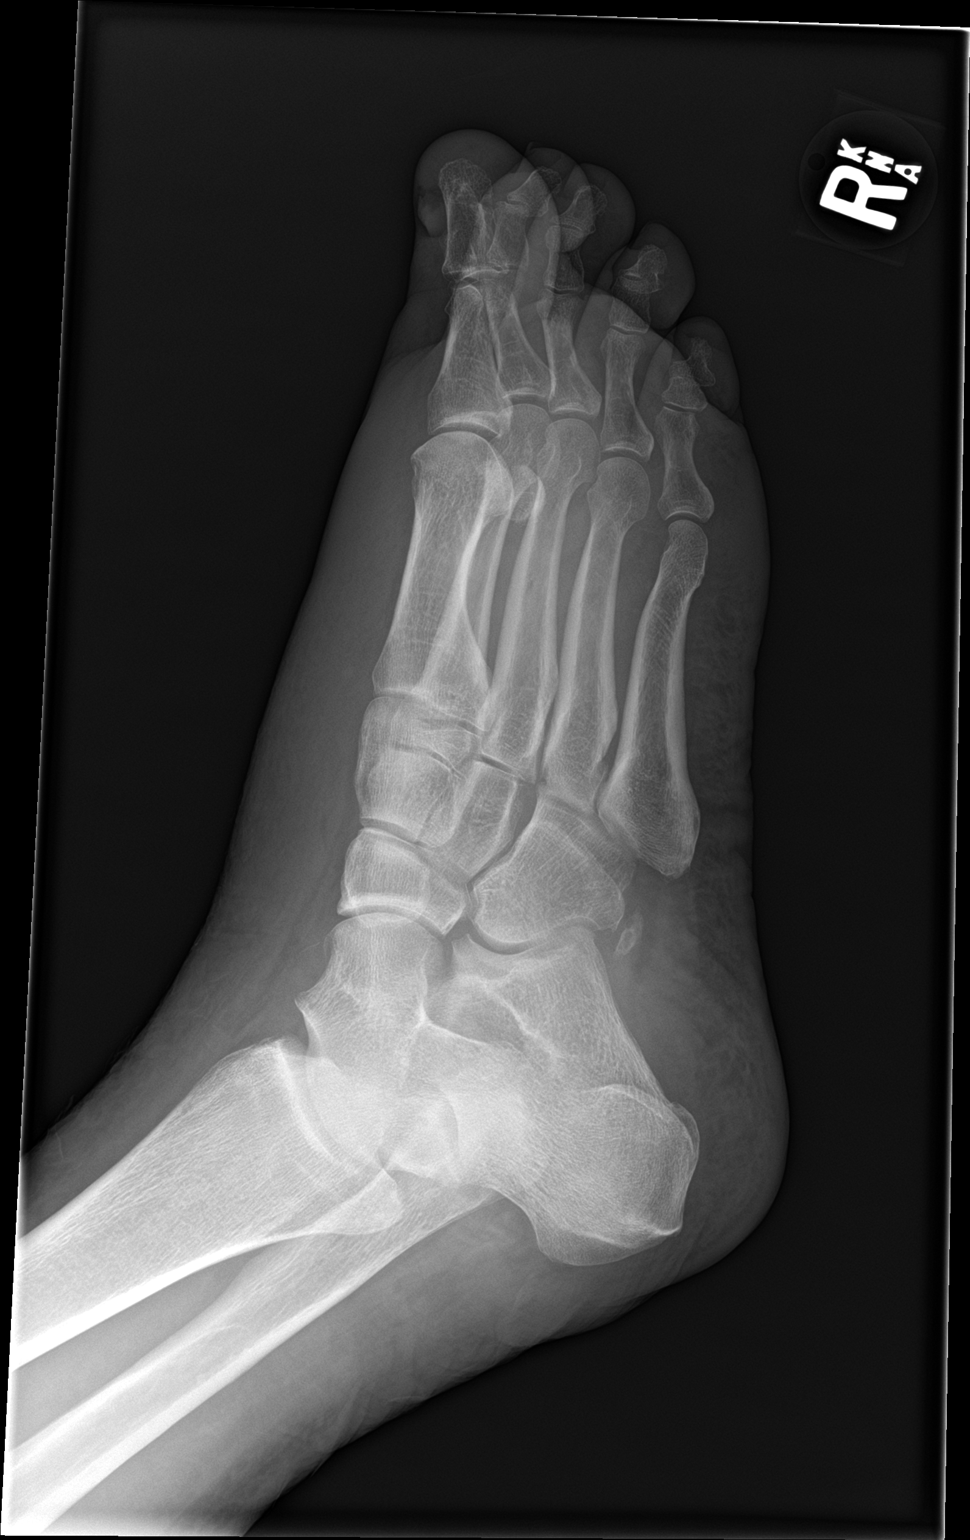

[foot lat]
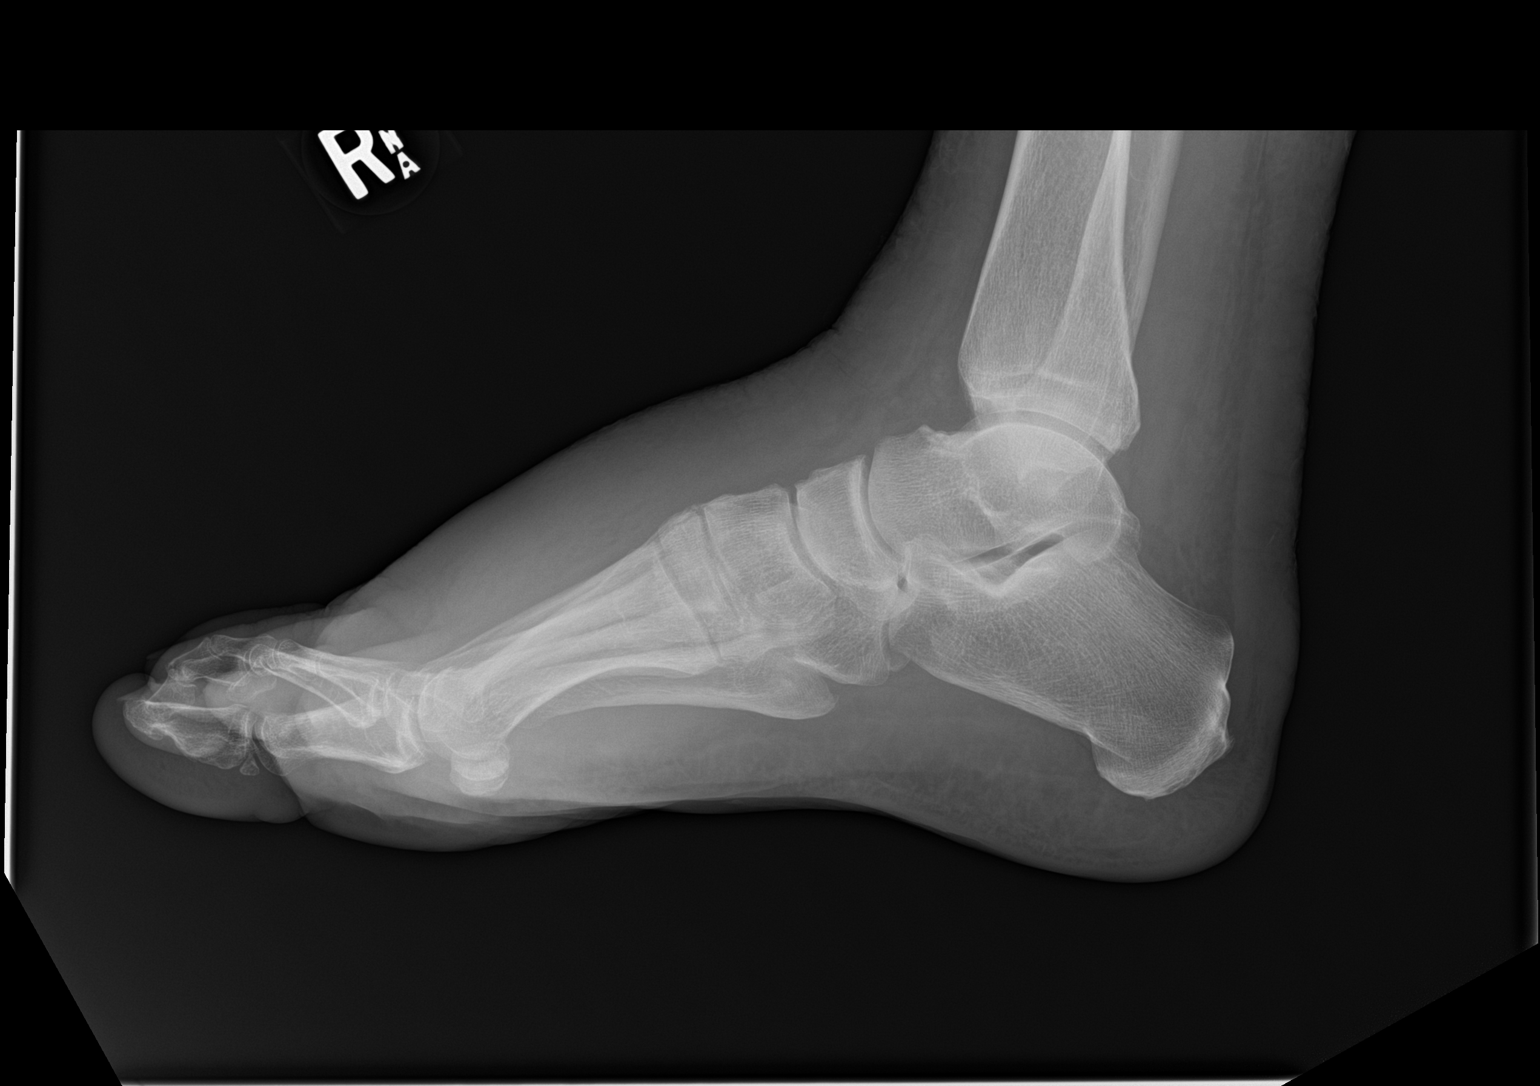

[3 of 3 positions shown; findings below may reference images not displayed]

FINDINGS: The bones appear mildly demineralized. There is minimal joint space
narrowing at the 1st metatarsophalangeal joint. No evidence of acute
fracture, dislocation or bone destruction. There is diffuse soft
tissue swelling, especially over the dorsum of the forefoot. No
foreign body or definite soft tissue emphysema.
IMPRESSION: Soft tissue swelling without radiographic evidence of osteomyelitis.

## 2020-01-26 DIAGNOSIS — M2550 Pain in unspecified joint: Secondary | ICD-10-CM | POA: Diagnosis not present

## 2020-01-26 DIAGNOSIS — M720 Palmar fascial fibromatosis [Dupuytren]: Secondary | ICD-10-CM | POA: Diagnosis not present

## 2020-01-26 DIAGNOSIS — M150 Primary generalized (osteo)arthritis: Secondary | ICD-10-CM | POA: Diagnosis not present

## 2020-01-26 DIAGNOSIS — R7989 Other specified abnormal findings of blood chemistry: Secondary | ICD-10-CM | POA: Diagnosis not present

## 2020-01-26 DIAGNOSIS — M7989 Other specified soft tissue disorders: Secondary | ICD-10-CM | POA: Diagnosis not present

## 2020-01-26 DIAGNOSIS — M5030 Other cervical disc degeneration, unspecified cervical region: Secondary | ICD-10-CM | POA: Diagnosis not present

## 2020-01-26 DIAGNOSIS — R768 Other specified abnormal immunological findings in serum: Secondary | ICD-10-CM | POA: Diagnosis not present

## 2020-02-04 DIAGNOSIS — M25551 Pain in right hip: Secondary | ICD-10-CM | POA: Diagnosis not present

## 2020-02-04 DIAGNOSIS — M25552 Pain in left hip: Secondary | ICD-10-CM | POA: Diagnosis not present

## 2020-02-11 DIAGNOSIS — M21621 Bunionette of right foot: Secondary | ICD-10-CM | POA: Diagnosis not present

## 2020-02-11 DIAGNOSIS — M722 Plantar fascial fibromatosis: Secondary | ICD-10-CM | POA: Diagnosis not present

## 2020-03-02 DIAGNOSIS — J300 Vasomotor rhinitis: Secondary | ICD-10-CM | POA: Diagnosis not present

## 2020-03-09 DIAGNOSIS — M7752 Other enthesopathy of left foot: Secondary | ICD-10-CM | POA: Diagnosis not present

## 2020-03-09 DIAGNOSIS — M7751 Other enthesopathy of right foot: Secondary | ICD-10-CM | POA: Diagnosis not present

## 2020-03-09 DIAGNOSIS — M21622 Bunionette of left foot: Secondary | ICD-10-CM | POA: Diagnosis not present

## 2020-03-09 DIAGNOSIS — D492 Neoplasm of unspecified behavior of bone, soft tissue, and skin: Secondary | ICD-10-CM | POA: Diagnosis not present

## 2020-03-09 DIAGNOSIS — M21621 Bunionette of right foot: Secondary | ICD-10-CM | POA: Diagnosis not present

## 2020-03-09 DIAGNOSIS — M25571 Pain in right ankle and joints of right foot: Secondary | ICD-10-CM | POA: Diagnosis not present

## 2020-03-24 DIAGNOSIS — D499 Neoplasm of unspecified behavior of unspecified site: Secondary | ICD-10-CM | POA: Diagnosis not present

## 2020-03-31 DIAGNOSIS — M7752 Other enthesopathy of left foot: Secondary | ICD-10-CM | POA: Diagnosis not present

## 2020-03-31 DIAGNOSIS — M722 Plantar fascial fibromatosis: Secondary | ICD-10-CM | POA: Diagnosis not present

## 2020-04-16 DIAGNOSIS — M792 Neuralgia and neuritis, unspecified: Secondary | ICD-10-CM | POA: Diagnosis not present

## 2020-05-25 ENCOUNTER — Ambulatory Visit (INDEPENDENT_AMBULATORY_CARE_PROVIDER_SITE_OTHER): Payer: BC Managed Care – PPO

## 2020-05-25 ENCOUNTER — Ambulatory Visit: Payer: Self-pay

## 2020-05-25 ENCOUNTER — Ambulatory Visit (INDEPENDENT_AMBULATORY_CARE_PROVIDER_SITE_OTHER): Payer: BC Managed Care – PPO | Admitting: Orthopedic Surgery

## 2020-05-25 DIAGNOSIS — M79671 Pain in right foot: Secondary | ICD-10-CM

## 2020-05-25 DIAGNOSIS — M6701 Short Achilles tendon (acquired), right ankle: Secondary | ICD-10-CM | POA: Diagnosis not present

## 2020-05-25 DIAGNOSIS — M722 Plantar fascial fibromatosis: Secondary | ICD-10-CM | POA: Diagnosis not present

## 2020-05-25 DIAGNOSIS — M79672 Pain in left foot: Secondary | ICD-10-CM

## 2020-05-25 DIAGNOSIS — M6702 Short Achilles tendon (acquired), left ankle: Secondary | ICD-10-CM

## 2020-05-25 DIAGNOSIS — M720 Palmar fascial fibromatosis [Dupuytren]: Secondary | ICD-10-CM

## 2020-05-26 ENCOUNTER — Encounter: Payer: Self-pay | Admitting: Orthopedic Surgery

## 2020-05-26 NOTE — Progress Notes (Signed)
Office Visit Note   Patient: Nathan Roberts           Date of Birth: 01-19-1961           MRN: [id number] Visit Date: 05/25/2020              Requested by: No referring provider defined for this encounter. PCP: Patient, No Pcp Per  Chief Complaint  Patient presents with  . Left Foot - Pain  . Right Foot - Pain      HPI: Patient is a 59 year old gentleman who presents for initial evaluation for bilateral foot pain.  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  Patient states he was referred by his insurance for evaluation for SonoRx extracorporeal shockwave therapy for his plantar fasciitis.  Assessment & Plan: Visit Diagnoses:  1. Pain in both feet   2. Achilles tendon contracture, bilateral   3. Plantar fascial fibromatosis   4. Dupuytren's contracture of both hands     Plan: Patient currently has forefoot pain and lateral foot pain secondary to overloading from his Achilles contracture.  Due to failure of conservative stretching discussed the option of gastrocnemius recession bilaterally.  Discussed that there are risks with surgery including infection neurovascular injury recurrence of the contracture numbness need for additional surgery.  Patient states he understands states he would like to proceed with bilateral surgeries at once.  We will plan for outpatient surgery at his convenience.  Follow-Up Instructions: Return in about 2 weeks (around 06/08/2020).   Ortho Exam  Patient is alert, oriented, no adenopathy, well-dressed, normal affect, normal respiratory effort. Examination patient does not have any pain at the origin of the plantar fascia he does have some plantar fibromatosis bilaterally however these are nontender to palpation.  Patient  has pain to palpation beneath the metatarsal heads as well as the lateral foot.  Patient has significant Achilles contracture with dorsiflexion 20 degrees short of neutral with his knee extended.  Patient has no pain to palpation of the plantar aspect of the heel no pain with lateral compression the posterior tibial tendon and peroneal tendons are nontender to palpation he does have some mild nodular changes in the Achilles on the left most likely secondary to the plantar fibromatosis.  Examination of his hands he does have Dupuytren's contractures with some fixed flexion of his fingers patient states that this is not bothering him and still has full function with both hands.  Patient does have some plantar thickening beneath the left first metatarsal head.  The right foot is most painful laterally over the fifth metatarsal head.  Imaging: XR Foot Complete Left  Result Date: 05/26/2020 Three-view radiographs of the left foot shows some calcification in the plantar fascia.  XR Foot Complete Right  Result Date: 05/26/2020 Three-view radiographs of the right foot shows some mild calcification of the plantar fascia at the metatarsal head.  No images are attached to the encounter.  Labs: Lab Results  Component Value Date   HGBA1C 6.3 (A) 05/27/2018   HGBA1C 6.1 (H) 11/27/2017     Lab Results  Component Value Date   ALBUMIN 4.2 05/27/2018   ALBUMIN 2.5 (L) 11/28/2017   ALBUMIN 3.0 (L) 11/27/2017    No results found for: MG Lab  Results  Component Value Date   VD25OH 48 12/18/2013    No results found for: PREALBUMIN CBC EXTENDED Latest Ref Rng & Units 05/27/2018 11/28/2017 11/27/2017  WBC 3.4 - 10.8 x10E3/uL 5.8 5.0 6.4  RBC 4.14 - 5.80 x10E6/uL 4.05(L) 3.87(L) 4.09(L)  HGB 13.0 - 17.7 g/dL 12.2(L) 12.2(L) 13.1  HCT 37.5 - 51.0 % 36.2(L) 36.3(L) 38.0(L)  PLT 150 - 450 x10E3/uL 116(L) 118(L) 142(L)  NEUTROABS 1.7 - 7.7 K/uL - - -  LYMPHSABS 0.7 - 4.0 K/uL - - -     There is no  height or weight on file to calculate BMI.  Orders:  Orders Placed This Encounter  Procedures  . XR Foot Complete Left  . XR Foot Complete Right   No orders of the defined types were placed in this encounter.    Procedures: No procedures performed  Clinical Data: No additional findings.  ROS:  All other systems negative, except as noted in the HPI. Review of Systems  Objective: Vital Signs: There were no vitals taken for this visit.  Specialty Comments:  No specialty comments available.  PMFS History: Patient Active Problem List   Diagnosis Date Noted  . 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 Elevated hemoglobin A1c 11/27/2017  . Transaminitis 11/27/2017  . Elevated LFTs 02/02/2014  . Family history of lung cancer 01/07/2014  . Hyperlipidemia 12/18/2013  . Allergic rhinitis 12/18/2013  . GERD (gastroesophageal reflux disease) 12/18/2013  . Generalized anxiety disorder 12/18/2013  . Chronic sinusitis 02/23/2012  . Persistent headaches 02/23/2012  . Cervical disc disease 11/01/2011  . Hypertension 11/01/2011   Past Medical History:  Diagnosis Date  . Allergy   . Arthritis   . Carpal tunnel syndrome   . GERD (gastroesophageal reflux disease)   . Hyperlipidemia   . Hypertension     Family History  Problem Relation Age of Onset  . Cancer Mother 48       lung--smoker  . Early death Mother   . Atrial fibrillation Father   . Heart disease Father   . COPD Father   . Hyperlipidemia Father   . Hypertension Father   . Colon cancer Neg Hx   . Esophageal cancer Neg Hx   . Stomach cancer Neg Hx   . Rectal cancer Neg Hx     Past Surgical History:  Procedure Laterality Date  . abscess     rectal area  . CARPAL TUNNEL RELEASE     left  . CERVICAL FUSION    . SPINE SURGERY     Social History   Occupational History  . Not on file  Tobacco Use  . Smoking status: Former Smoker    Types: Cigarettes    Quit date: 12/08/1996    Years since quitting: 23.4  .  Smokeless tobacco: Never Used  Substance and Sexual Activity  . Alcohol use: Yes    Alcohol/week: 9.0 standard drinks    Types: 2 Glasses of wine, 5 Cans of beer, 1 Shots of liquor, 1 Standard drinks or equivalent per week  . Drug use: No  . Sexual activity: Not Currently

## 2020-06-28 DIAGNOSIS — Z79899 Other long term (current) drug therapy: Secondary | ICD-10-CM | POA: Diagnosis not present

## 2020-06-28 DIAGNOSIS — M25571 Pain in right ankle and joints of right foot: Secondary | ICD-10-CM | POA: Diagnosis not present

## 2020-06-28 DIAGNOSIS — Z01812 Encounter for preprocedural laboratory examination: Secondary | ICD-10-CM | POA: Diagnosis not present

## 2020-06-28 DIAGNOSIS — M2041 Other hammer toe(s) (acquired), right foot: Secondary | ICD-10-CM | POA: Diagnosis not present

## 2020-06-28 DIAGNOSIS — M21621 Bunionette of right foot: Secondary | ICD-10-CM | POA: Diagnosis not present

## 2020-06-29 DIAGNOSIS — I10 Essential (primary) hypertension: Secondary | ICD-10-CM | POA: Diagnosis not present

## 2020-06-29 DIAGNOSIS — R7303 Prediabetes: Secondary | ICD-10-CM | POA: Diagnosis not present

## 2020-06-29 DIAGNOSIS — R748 Abnormal levels of other serum enzymes: Secondary | ICD-10-CM | POA: Diagnosis not present

## 2020-06-29 DIAGNOSIS — M2550 Pain in unspecified joint: Secondary | ICD-10-CM | POA: Diagnosis not present

## 2020-07-05 DIAGNOSIS — M25571 Pain in right ankle and joints of right foot: Secondary | ICD-10-CM | POA: Diagnosis not present

## 2020-07-06 DIAGNOSIS — M2041 Other hammer toe(s) (acquired), right foot: Secondary | ICD-10-CM | POA: Diagnosis not present

## 2020-07-06 DIAGNOSIS — M2011 Hallux valgus (acquired), right foot: Secondary | ICD-10-CM | POA: Diagnosis not present

## 2020-07-06 DIAGNOSIS — M79671 Pain in right foot: Secondary | ICD-10-CM | POA: Diagnosis not present

## 2020-07-06 DIAGNOSIS — M25571 Pain in right ankle and joints of right foot: Secondary | ICD-10-CM | POA: Diagnosis not present

## 2020-07-09 DIAGNOSIS — M12271 Villonodular synovitis (pigmented), right ankle and foot: Secondary | ICD-10-CM | POA: Diagnosis not present

## 2020-07-15 DIAGNOSIS — M12271 Villonodular synovitis (pigmented), right ankle and foot: Secondary | ICD-10-CM | POA: Diagnosis not present

## 2020-07-16 DIAGNOSIS — R609 Edema, unspecified: Secondary | ICD-10-CM | POA: Diagnosis not present

## 2020-07-21 DIAGNOSIS — M12271 Villonodular synovitis (pigmented), right ankle and foot: Secondary | ICD-10-CM | POA: Diagnosis not present

## 2020-08-04 DIAGNOSIS — M12271 Villonodular synovitis (pigmented), right ankle and foot: Secondary | ICD-10-CM | POA: Diagnosis not present

## 2020-08-18 DIAGNOSIS — R7303 Prediabetes: Secondary | ICD-10-CM | POA: Diagnosis not present

## 2020-08-18 DIAGNOSIS — M2550 Pain in unspecified joint: Secondary | ICD-10-CM | POA: Diagnosis not present

## 2020-08-18 DIAGNOSIS — M720 Palmar fascial fibromatosis [Dupuytren]: Secondary | ICD-10-CM | POA: Diagnosis not present

## 2020-08-30 DIAGNOSIS — R200 Anesthesia of skin: Secondary | ICD-10-CM | POA: Diagnosis not present

## 2020-08-30 DIAGNOSIS — M720 Palmar fascial fibromatosis [Dupuytren]: Secondary | ICD-10-CM | POA: Diagnosis not present

## 2020-08-31 ENCOUNTER — Encounter: Payer: Self-pay | Admitting: Neurology

## 2020-08-31 ENCOUNTER — Other Ambulatory Visit: Payer: Self-pay

## 2020-08-31 DIAGNOSIS — R202 Paresthesia of skin: Secondary | ICD-10-CM

## 2020-09-01 DIAGNOSIS — M12271 Villonodular synovitis (pigmented), right ankle and foot: Secondary | ICD-10-CM | POA: Diagnosis not present

## 2020-09-13 ENCOUNTER — Other Ambulatory Visit: Payer: Self-pay | Admitting: Physician Assistant

## 2020-09-21 ENCOUNTER — Other Ambulatory Visit: Payer: Self-pay

## 2020-09-21 ENCOUNTER — Telehealth: Payer: Self-pay | Admitting: Orthopedic Surgery

## 2020-09-21 ENCOUNTER — Encounter (HOSPITAL_BASED_OUTPATIENT_CLINIC_OR_DEPARTMENT_OTHER): Payer: Self-pay | Admitting: Orthopedic Surgery

## 2020-09-21 NOTE — Telephone Encounter (Signed)
Please see msg for xys. Thank you.

## 2020-09-21 NOTE — Telephone Encounter (Signed)
Please copy 05/26/20 xrys to CD. Let me know when ready and I'll pick up. Thanks!

## 2020-09-21 NOTE — Telephone Encounter (Signed)
Thank you~I'll be by shortly.

## 2020-09-21 NOTE — Telephone Encounter (Signed)
CD has been made!  

## 2020-09-22 ENCOUNTER — Ambulatory Visit: Payer: BC Managed Care – PPO | Admitting: Neurology

## 2020-09-22 DIAGNOSIS — R202 Paresthesia of skin: Secondary | ICD-10-CM

## 2020-09-22 DIAGNOSIS — G5601 Carpal tunnel syndrome, right upper limb: Secondary | ICD-10-CM

## 2020-09-22 NOTE — Procedures (Signed)
[facility]  [address] [place], [address]  [place], [place] [address] Tel: [phone] Fax:  [phone] Test Date:  09/22/2020  Patient: Nathan Roberts DOB: 1961-04-14 Physician: Nita Sickle, DO  Sex: Male Height: 5\' 9"  Ref Phys: , MD  ID#: [id number]   Technician:    Patient Complaints: This is a 60 year old man with history of C4-6 ACDF and left CTS release referred for evaluation of right hand numbness and tingling.  NCV & EMG Findings: Extensive electrodiagnostic testing of the right upper extremity shows:  1. Right median sensory response shows prolonged latency (3.9 ms) and reduced amplitude (7.8 V).  Left median, left mixed palmar, and bilateral ulnar sensory responses are within normal limits.   2. Bilateral median and ulnar motor responses are within normal limits.  Of note, there is evidence of bilateral Martin-Gruber anastomosis as seen by a motor response stimulating at the ulnar-wrist and recording at the abductor pollicis brevis muscle.   3. There is no evidence of active or chronic motor axonal loss changes affecting any of the tested muscles.  Motor unit configuration and recruitment pattern is within normal limits.  Impression: 1. 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   2. There is no evidence of a cervical radiculopathy. 3. Incidentally, there is evidence of bilateral Martin-Gruber anastomoses, a normal anatomic variant.   ___________________________ 77, DO    Nerve Conduction Studies Anti Sensory Summary Table   Stim Site NR Peak (ms) Norm Peak (ms) P-T Amp (V) Norm P-T Amp  Left Median Anti Sensory (2nd Digit)  34C  Wrist    3.2 <3.8 12.1 >10  Right Median Anti Sensory (2nd Digit)  34C  Wrist    3.9 <3.8 7.8 >10  Left Ulnar Anti Sensory (5th Digit)  34C  Wrist    3.1 <3.2 16.8 >5  Right Ulnar Anti Sensory (5th Digit)  34C  Wrist     3.1 <3.2 15.2 >5   Motor Summary Table   Stim Site NR Onset (ms) Norm Onset (ms) O-P Amp (mV) Norm O-P Amp Site1 Site2 Delta-0 (ms) Dist (cm) Vel (m/s) Norm Vel (m/s)  Left Median Motor (Abd Poll Brev)  34C  Wrist    3.9 <4.0 5.4 >5 Elbow Wrist 6.0 31.0 52 >50  Elbow    9.9  4.1  Ulnar-wrist crossover Elbow 6.1 0.0    Ulnar-wrist crossover    3.8  2.2         Right Median Motor (Abd Poll Brev)  34C  Wrist    3.6 <4.0 8.5 >5 Elbow Wrist 6.0 31.0 52 >50  Elbow    9.6  7.4  Ulnar-wrist crossover Elbow 5.5 0.0    Ulnar-wrist crossover    4.1  2.2         Left Ulnar Motor (Abd Dig Minimi)  34C  Wrist    2.5 <3.1 9.0 >7 B Elbow Wrist 4.0 24.0 60 >50  B Elbow    6.5  7.9  A Elbow B Elbow 1.8 10.0 56 >50  A Elbow    8.3  7.4         Right Ulnar Motor (Abd Dig Minimi)  34C  Wrist    2.4 <3.1 9.6 >7 B Elbow Wrist 4.1 23.0 56 >50  B Elbow    6.5  8.4  A Elbow B Elbow 1.7 10.0 59 >50  A Elbow  8.2  8.2          Comparison Summary Table   Stim Site NR Peak (ms) Norm Peak (ms) P-T Amp (V) Site1 Site2 Delta-P (ms) Norm Delta (ms)  Left Median/Ulnar Palm Comparison (Wrist - 8cm)  34C  Median Palm    2.0 <2.2 26.7 Median Palm Ulnar Palm 0.2   Ulnar Palm    1.8 <2.2 13.8       EMG   Side Muscle Ins Act Fibs Psw Fasc Number Recrt Dur Dur. Amp Amp. Poly Poly. Comment  Right 1stDorInt Nml Nml Nml Nml Nml Nml Nml Nml Nml Nml Nml Nml N/A  Right Abd Poll Brev Nml Nml Nml Nml Nml Nml Nml Nml Nml Nml Nml Nml N/A  Right PronatorTeres Nml Nml Nml Nml Nml Nml Nml Nml Nml Nml Nml Nml N/A  Right Biceps Nml Nml Nml Nml Nml Nml Nml Nml Nml Nml Nml Nml N/A  Right Triceps Nml Nml Nml Nml Nml Nml Nml Nml Nml Nml Nml Nml N/A  Right Deltoid Nml Nml Nml Nml Nml Nml Nml Nml Nml Nml Nml Nml N/A  Left 1stDorInt Nml Nml Nml Nml Nml Nml Nml Nml Nml Nml Nml Nml N/A  Left PronatorTeres Nml Nml Nml Nml Nml Nml Nml Nml Nml Nml Nml Nml N/A  Left Biceps Nml Nml Nml Nml Nml Nml Nml Nml Nml Nml Nml Nml N/A  Left  Triceps Nml Nml Nml Nml Nml Nml Nml Nml Nml Nml Nml Nml N/A  Left Deltoid Nml Nml Nml Nml Nml Nml Nml Nml Nml Nml Nml Nml N/A      Waveforms:

## 2020-09-24 ENCOUNTER — Other Ambulatory Visit (HOSPITAL_COMMUNITY)
Admission: RE | Admit: 2020-09-24 | Discharge: 2020-09-24 | Disposition: A | Discharge status: Home / Self Care | Payer: BC Managed Care – PPO | Source: Ambulatory Visit | Attending: Orthopedic Surgery | Admitting: Orthopedic Surgery

## 2020-09-24 ENCOUNTER — Encounter (HOSPITAL_BASED_OUTPATIENT_CLINIC_OR_DEPARTMENT_OTHER)
Admission: RE | Admit: 2020-09-24 | Discharge: 2020-09-24 | Disposition: A | Discharge status: Home / Self Care | Payer: BC Managed Care – PPO | Source: Ambulatory Visit | Attending: Orthopedic Surgery | Admitting: Orthopedic Surgery

## 2020-09-24 DIAGNOSIS — Z20822 Contact with and (suspected) exposure to covid-19: Secondary | ICD-10-CM | POA: Insufficient documentation

## 2020-09-24 DIAGNOSIS — Z01812 Encounter for preprocedural laboratory examination: Secondary | ICD-10-CM | POA: Insufficient documentation

## 2020-09-24 DIAGNOSIS — E119 Type 2 diabetes mellitus without complications: Secondary | ICD-10-CM | POA: Diagnosis not present

## 2020-09-24 DIAGNOSIS — I10 Essential (primary) hypertension: Secondary | ICD-10-CM | POA: Diagnosis not present

## 2020-09-24 DIAGNOSIS — Z01818 Encounter for other preprocedural examination: Secondary | ICD-10-CM | POA: Insufficient documentation

## 2020-09-24 LAB — BASIC METABOLIC PANEL
Anion gap: 10 (ref 5–15)
BUN: 20 mg/dL (ref 8–23)
CO2: 20 mmol/L — ABNORMAL LOW (ref 22–32)
Calcium: 8 mg/dL — ABNORMAL LOW (ref 8.9–10.3)
Chloride: 103 mmol/L (ref 98–111)
Creatinine, Ser: 0.81 mg/dL (ref 0.61–1.24)
GFR, Estimated: >60 mL/min (ref >60)
Glucose, Bld: 161 mg/dL — ABNORMAL HIGH (ref 70–99)
Potassium: 4.1 mmol/L (ref 3.5–5.1)
Sodium: 133 mmol/L — ABNORMAL LOW (ref 135–145)

## 2020-09-24 LAB — SARS CORONAVIRUS 2 (TAT 6-24 HRS): SARS Coronavirus 2: NEGATIVE

## 2020-09-24 NOTE — Progress Notes (Signed)

## 2020-09-28 ENCOUNTER — Ambulatory Visit (HOSPITAL_BASED_OUTPATIENT_CLINIC_OR_DEPARTMENT_OTHER)
Admission: RE | Admit: 2020-09-28 | Payer: BC Managed Care – PPO | Source: Home / Self Care | Admitting: Orthopedic Surgery

## 2020-09-28 HISTORY — DX: Prediabetes: R73.03

## 2020-09-28 SURGERY — RECESSION, MUSCLE, GASTROCNEMIUS
Anesthesia: Choice | Laterality: Left

## 2020-09-30 DIAGNOSIS — K921 Melena: Secondary | ICD-10-CM | POA: Diagnosis not present

## 2020-09-30 DIAGNOSIS — R7303 Prediabetes: Secondary | ICD-10-CM | POA: Diagnosis not present

## 2020-09-30 DIAGNOSIS — M722 Plantar fascial fibromatosis: Secondary | ICD-10-CM | POA: Diagnosis not present

## 2020-09-30 DIAGNOSIS — M7751 Other enthesopathy of right foot: Secondary | ICD-10-CM | POA: Diagnosis not present

## 2020-09-30 DIAGNOSIS — M2550 Pain in unspecified joint: Secondary | ICD-10-CM | POA: Diagnosis not present

## 2020-09-30 DIAGNOSIS — M720 Palmar fascial fibromatosis [Dupuytren]: Secondary | ICD-10-CM | POA: Diagnosis not present

## 2020-09-30 DIAGNOSIS — I10 Essential (primary) hypertension: Secondary | ICD-10-CM | POA: Diagnosis not present

## 2020-10-01 ENCOUNTER — Other Ambulatory Visit: Payer: Self-pay | Admitting: Orthopedic Surgery

## 2020-10-01 DIAGNOSIS — R200 Anesthesia of skin: Secondary | ICD-10-CM | POA: Diagnosis not present

## 2020-10-06 ENCOUNTER — Observation Stay (HOSPITAL_COMMUNITY)
Admission: EM | Admit: 2020-10-06 | Discharge: 2020-10-08 | Disposition: A | Discharge status: Home / Self Care | Payer: BC Managed Care – PPO | Attending: Internal Medicine | Admitting: Internal Medicine

## 2020-10-06 ENCOUNTER — Other Ambulatory Visit: Payer: Self-pay

## 2020-10-06 ENCOUNTER — Encounter (HOSPITAL_COMMUNITY): Payer: Self-pay | Admitting: *Deleted

## 2020-10-06 ENCOUNTER — Observation Stay (HOSPITAL_COMMUNITY): Payer: BC Managed Care – PPO

## 2020-10-06 DIAGNOSIS — K3189 Other diseases of stomach and duodenum: Secondary | ICD-10-CM | POA: Diagnosis not present

## 2020-10-06 DIAGNOSIS — R600 Localized edema: Secondary | ICD-10-CM

## 2020-10-06 DIAGNOSIS — K921 Melena: Secondary | ICD-10-CM

## 2020-10-06 DIAGNOSIS — K922 Gastrointestinal hemorrhage, unspecified: Secondary | ICD-10-CM | POA: Diagnosis present

## 2020-10-06 DIAGNOSIS — E785 Hyperlipidemia, unspecified: Secondary | ICD-10-CM | POA: Diagnosis present

## 2020-10-06 DIAGNOSIS — Z7289 Other problems related to lifestyle: Secondary | ICD-10-CM | POA: Diagnosis not present

## 2020-10-06 DIAGNOSIS — R7303 Prediabetes: Secondary | ICD-10-CM | POA: Insufficient documentation

## 2020-10-06 DIAGNOSIS — D539 Nutritional anemia, unspecified: Secondary | ICD-10-CM | POA: Diagnosis present

## 2020-10-06 DIAGNOSIS — Z7984 Long term (current) use of oral hypoglycemic drugs: Secondary | ICD-10-CM | POA: Diagnosis not present

## 2020-10-06 DIAGNOSIS — K766 Portal hypertension: Secondary | ICD-10-CM | POA: Diagnosis not present

## 2020-10-06 DIAGNOSIS — I8500 Esophageal varices without bleeding: Principal | ICD-10-CM | POA: Diagnosis present

## 2020-10-06 DIAGNOSIS — D649 Anemia, unspecified: Secondary | ICD-10-CM | POA: Diagnosis present

## 2020-10-06 DIAGNOSIS — Z791 Long term (current) use of non-steroidal anti-inflammatories (NSAID): Secondary | ICD-10-CM | POA: Diagnosis not present

## 2020-10-06 DIAGNOSIS — I8510 Secondary esophageal varices without bleeding: Secondary | ICD-10-CM | POA: Diagnosis not present

## 2020-10-06 DIAGNOSIS — I10 Essential (primary) hypertension: Secondary | ICD-10-CM | POA: Diagnosis present

## 2020-10-06 DIAGNOSIS — Z20822 Contact with and (suspected) exposure to covid-19: Secondary | ICD-10-CM | POA: Insufficient documentation

## 2020-10-06 DIAGNOSIS — Z87891 Personal history of nicotine dependence: Secondary | ICD-10-CM | POA: Insufficient documentation

## 2020-10-06 DIAGNOSIS — Z79899 Other long term (current) drug therapy: Secondary | ICD-10-CM | POA: Insufficient documentation

## 2020-10-06 DIAGNOSIS — Z91040 Latex allergy status: Secondary | ICD-10-CM | POA: Insufficient documentation

## 2020-10-06 DIAGNOSIS — K219 Gastro-esophageal reflux disease without esophagitis: Secondary | ICD-10-CM | POA: Diagnosis present

## 2020-10-06 DIAGNOSIS — K7460 Unspecified cirrhosis of liver: Secondary | ICD-10-CM

## 2020-10-06 DIAGNOSIS — Z789 Other specified health status: Secondary | ICD-10-CM

## 2020-10-06 DIAGNOSIS — F1090 Alcohol use, unspecified, uncomplicated: Secondary | ICD-10-CM

## 2020-10-06 LAB — HIV ANTIBODY (ROUTINE TESTING W REFLEX): HIV Screen 4th Generation wRfx: NONREACTIVE

## 2020-10-06 LAB — CBC WITH DIFFERENTIAL/PLATELET
Abs Immature Granulocytes: 0.04 10*3/uL (ref 0.00–0.07)
Basophils Absolute: 0 10*3/uL (ref 0.0–0.1)
Basophils Relative: 1 %
Eosinophils Absolute: 0.5 10*3/uL (ref 0.0–0.5)
Eosinophils Relative: 10 %
HCT: 20.7 % — ABNORMAL LOW (ref 39.0–52.0)
Hemoglobin: 5.6 g/dL — CL (ref 13.0–17.0)
Immature Granulocytes: 1 %
Lymphocytes Relative: 15 %
Lymphs Abs: 0.8 10*3/uL (ref 0.7–4.0)
MCH: 24 pg — ABNORMAL LOW (ref 26.0–34.0)
MCHC: 27.1 g/dL — ABNORMAL LOW (ref 30.0–36.0)
MCV: 88.8 fL (ref 80.0–100.0)
Monocytes Absolute: 0.8 10*3/uL (ref 0.1–1.0)
Monocytes Relative: 15 %
Neutro Abs: 3.3 10*3/uL (ref 1.7–7.7)
Neutrophils Relative %: 58 %
Platelets: 170 10*3/uL (ref 150–400)
RBC: 2.33 MIL/uL — ABNORMAL LOW (ref 4.22–5.81)
RDW: 20.9 % — ABNORMAL HIGH (ref 11.5–15.5)
WBC: 5.5 10*3/uL (ref 4.0–10.5)
nRBC: 0 % (ref 0.0–0.2)

## 2020-10-06 LAB — COMPREHENSIVE METABOLIC PANEL
ALT: 39 U/L (ref 0–44)
AST: 64 U/L — ABNORMAL HIGH (ref 15–41)
Albumin: 2.7 g/dL — ABNORMAL LOW (ref 3.5–5.0)
Alkaline Phosphatase: 122 U/L (ref 38–126)
Anion gap: 9 (ref 5–15)
BUN: 7 mg/dL — ABNORMAL LOW (ref 8–23)
CO2: 22 mmol/L (ref 22–32)
Calcium: 8 mg/dL — ABNORMAL LOW (ref 8.9–10.3)
Chloride: 103 mmol/L (ref 98–111)
Creatinine, Ser: 0.81 mg/dL (ref 0.61–1.24)
GFR, Estimated: >60 mL/min (ref >60)
Glucose, Bld: 200 mg/dL — ABNORMAL HIGH (ref 70–99)
Potassium: 4.6 mmol/L (ref 3.5–5.1)
Sodium: 134 mmol/L — ABNORMAL LOW (ref 135–145)
Total Bilirubin: 0.9 mg/dL (ref 0.3–1.2)
Total Protein: 6.7 g/dL (ref 6.5–8.1)

## 2020-10-06 LAB — LIPID PANEL
Cholesterol: 87 mg/dL (ref 0–200)
HDL: 16 mg/dL — ABNORMAL LOW (ref >40)
LDL Cholesterol: 53 mg/dL (ref 0–99)
Total CHOL/HDL Ratio: 5.4 RATIO
Triglycerides: 91 mg/dL (ref <150)
VLDL: 18 mg/dL (ref 0–40)

## 2020-10-06 LAB — SARS CORONAVIRUS 2 (TAT 6-24 HRS): SARS Coronavirus 2: NEGATIVE

## 2020-10-06 LAB — FERRITIN: Ferritin: 20 ng/mL — ABNORMAL LOW (ref 24–336)

## 2020-10-06 LAB — PHOSPHORUS: Phosphorus: 2.7 mg/dL (ref 2.5–4.6)

## 2020-10-06 LAB — IRON AND TIBC
Iron: 12 ug/dL — ABNORMAL LOW (ref 45–182)
Saturation Ratios: 2 % — ABNORMAL LOW (ref 17.9–39.5)
TIBC: 483 ug/dL — ABNORMAL HIGH (ref 250–450)
UIBC: 471 ug/dL

## 2020-10-06 LAB — PREPARE RBC (CROSSMATCH)

## 2020-10-06 LAB — MAGNESIUM: Magnesium: 2.2 mg/dL (ref 1.7–2.4)

## 2020-10-06 LAB — ABO/RH: ABO/RH(D): O NEG

## 2020-10-06 LAB — POC OCCULT BLOOD, ED: Fecal Occult Bld: NEGATIVE

## 2020-10-06 MED ORDER — ONDANSETRON HCL 4 MG/2ML IJ SOLN: 4.0000 mg | Freq: Four times a day (QID) | INTRAMUSCULAR | Status: DC | PRN

## 2020-10-06 MED ORDER — CLINDAMYCIN PHOSPHATE 900 MG/50ML IV SOLN: 900.0000 mg | INTRAVENOUS | Status: DC

## 2020-10-06 MED ORDER — LORAZEPAM 1 MG PO TABS
1.0000 mg | ORAL_TABLET | ORAL | Status: DC | PRN
  Administered 2020-10-07: 1 mg via ORAL
  Filled 2020-10-06: qty 1

## 2020-10-06 MED ORDER — PANTOPRAZOLE SODIUM 40 MG IV SOLR
40.0000 mg | Freq: Once | INTRAVENOUS | Status: AC
  Administered 2020-10-06: 40 mg via INTRAVENOUS
  Filled 2020-10-06: qty 40

## 2020-10-06 MED ORDER — ADULT MULTIVITAMIN W/MINERALS CH
1.0000 | ORAL_TABLET | Freq: Every day | ORAL | Status: DC
  Administered 2020-10-06 – 2020-10-08 (×2): 1 via ORAL
  Filled 2020-10-06 (×2): qty 1

## 2020-10-06 MED ORDER — ONDANSETRON HCL 4 MG PO TABS: 4.0000 mg | ORAL_TABLET | Freq: Four times a day (QID) | ORAL | Status: DC | PRN

## 2020-10-06 MED ORDER — LORAZEPAM 2 MG/ML IJ SOLN
1.0000 mg | INTRAMUSCULAR | Status: DC | PRN
Start: 2020-10-06 — End: 2020-10-08
  Filled 2020-10-06: qty 1

## 2020-10-06 MED ORDER — THIAMINE HCL 100 MG PO TABS
100.0000 mg | ORAL_TABLET | Freq: Every day | ORAL | Status: DC
  Administered 2020-10-06 – 2020-10-08 (×2): 100 mg via ORAL
  Filled 2020-10-06 (×2): qty 1

## 2020-10-06 MED ORDER — THIAMINE HCL 100 MG/ML IJ SOLN: 100.0000 mg | Freq: Every day | INTRAMUSCULAR | Status: DC

## 2020-10-06 MED ORDER — SODIUM CHLORIDE 0.9% IV SOLUTION: Freq: Once | INTRAVENOUS | Status: AC

## 2020-10-06 MED ORDER — FOLIC ACID 1 MG PO TABS
1.0000 mg | ORAL_TABLET | Freq: Every day | ORAL | Status: DC
  Administered 2020-10-06 – 2020-10-08 (×2): 1 mg via ORAL
  Filled 2020-10-06 (×2): qty 1

## 2020-10-06 MED ORDER — PANTOPRAZOLE SODIUM 40 MG IV SOLR
40.0000 mg | Freq: Two times a day (BID) | INTRAVENOUS | Status: DC
  Administered 2020-10-06 – 2020-10-07 (×2): 40 mg via INTRAVENOUS
  Filled 2020-10-06 (×2): qty 40

## 2020-10-06 MED ORDER — SODIUM CHLORIDE 0.9 % IV SOLN: INTRAVENOUS | Status: AC

## 2020-10-06 MED ORDER — HYDRALAZINE HCL 20 MG/ML IJ SOLN: 10.0000 mg | Freq: Three times a day (TID) | INTRAMUSCULAR | Status: DC | PRN

## 2020-10-06 NOTE — ED Provider Notes (Signed)
[facility]-EMERGENCY DEPT Provider Note   CSN: [id number] Arrival date & time: 10/06/20  0947     History Chief Complaint  Patient presents with  . Abnormal Lab  . GI Bleeding    Pankaj Haack is a 60 y.o. male.  Patient is a 60 year old male with a history of hypertension, lipidemia, prediabetes who is presenting today with several issues.  Patient reports that about 2 weeks ago he started noticing dark black tarry stools that he had for approximately a week that resolved about Saturday or Sunday, generalized fatigue, exertional dyspnea, lightheaded with standing and some swelling in his lower extremities.  He denies any emesis or nausea.  No chest pain.  He has not had any urinary issues and has been eating and drinking normally.  He does drink about 90 ounces of water per day, takes ibuprofen daily and drinks 5-6 beers per day.  He reports if he does not drink beer he does not go through withdrawal symptoms.  He went to his doctor on Thursday due to the concern of severe fatigue.  They called him later that day and told him his hemoglobin was low and his sodium levels were low and he should go to the hospital.  He was hesitant to go to the hospital so started eating foods with a lot of iron hoping that he would start feeling better however today he was not any better and decided to come.  Patient reports this happened one other time in December 2021.  He did not seek care at that time and reported it took about 3 weeks for him to feel back to normal.  He has no prior history of cirrhosis or GI bleeding.  He does not take anticoagulation.  He had a colonoscopy 10 years ago and was told that he was good for 10 years.  The history is provided by the patient.  Abnormal Lab      Past Medical History:  Diagnosis Date  . Allergy   . Arthritis   . Carpal tunnel syndrome   . GERD (gastroesophageal reflux disease)   . Hyperlipidemia   . Hypertension   . Pre-diabetes      Patient Active Problem List   Diagnosis Date Noted  . 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 Elevated hemoglobin A1c 11/27/2017  . Transaminitis 11/27/2017  . Elevated LFTs 02/02/2014  . Family history of lung cancer 01/07/2014  . Hyperlipidemia 12/18/2013  . Allergic rhinitis 12/18/2013  . GERD (gastroesophageal reflux disease) 12/18/2013  . Generalized anxiety disorder 12/18/2013  . Chronic sinusitis 02/23/2012  . Persistent headaches 02/23/2012  . Cervical disc disease 11/01/2011  . Hypertension 11/01/2011    Past Surgical History:  Procedure Laterality Date  . abscess     rectal area  . CARPAL TUNNEL RELEASE     left  . CERVICAL FUSION    . SPINE SURGERY         Family History  Problem Relation Age of Onset  . Cancer Mother 75       lung--smoker  . Early death Mother   . Atrial fibrillation Father   . Heart disease Father   . COPD Father   . Hyperlipidemia Father   . Hypertension Father   . Colon cancer Neg Hx   . Esophageal cancer Neg Hx   . Stomach cancer Neg Hx   . Rectal cancer Neg Hx     Social History   Tobacco Use  . Smoking status: Former Smoker  Types: Cigarettes    Quit date: 12/08/1996    Years since quitting: 23.8  . Smokeless tobacco: Never Used  Vaping Use  . Vaping Use: Never used  Substance Use Topics  . Alcohol use: Yes    Alcohol/week: 9.0 standard drinks    Types: 2 Glasses of wine, 5 Cans of beer, 1 Shots of liquor, 1 Standard drinks or equivalent per week  . Drug use: No    Home Medications Prior to Admission medications   Medication Sig Start Date End Date Taking? Authorizing Provider  amLODipine (NORVASC) 10 MG tablet TAKE 1 TABLET BY MOUTH EVERY DAY 12/14/18  Yes McVey, Madelaine Bhat, PA-C  losartan (COZAAR) 100 MG tablet TAKE 1/2 TABLET (50 MG TOTAL) BY MOUTH DAILY. 03/21/19  Yes Stallings, Zoe A, MD  meloxicam (MOBIC) 15 MG tablet Take 15 mg by mouth daily.   Yes [provider]  metFORMIN (GLUCOPHAGE) 500 MG  tablet Take by mouth 2 (two) times daily with a meal.   Yes [provider]  mirtazapine (REMERON) 15 MG tablet Take 15 mg by mouth at bedtime. 09/14/20  Yes [provider]  Multiple Vitamins-Minerals (MULTIVITAMIN WITH MINERALS) tablet Take 1 tablet by mouth daily.   Yes [provider]  pantoprazole (PROTONIX) 40 MG tablet Take 40 mg by mouth daily. 09/30/20  Yes [provider]  buPROPion (WELLBUTRIN SR) 100 MG 12 hr tablet Take 100 mg by mouth in the morning. Patient not taking: Reported on 10/06/2020 10/01/20   [provider]  fluticasone (FLONASE) 50 MCG/ACT nasal spray Place into both nostrils daily. Patient not taking: Reported on 10/06/2020    [provider]  olmesartan (BENICAR) 40 MG tablet Take 40 mg by mouth daily. Patient not taking: Reported on 10/06/2020 09/30/20   [provider]    Allergies    Latex and Cephalexin  Review of Systems   Review of Systems  All other systems reviewed and are negative.   Physical Exam Updated Vital Signs BP (!) 142/73 (BP Location: Left Arm)   Pulse (!) 102   Temp (!) 97.5 F (36.4 C) (Oral)   Resp 18   SpO2 100%   Physical Exam Vitals and nursing note reviewed.  Constitutional:      General: He is not in acute distress.    Appearance: Normal appearance. He is well-developed and well-nourished.  HENT:     Head: Normocephalic and atraumatic.     Comments: Bruising to the scalp    Mouth/Throat:     Mouth: Oropharynx is clear and moist. Mucous membranes are dry.  Eyes:     Extraocular Movements: EOM normal.     Pupils: Pupils are equal, round, and reactive to light.     Comments: Pale conjunctive a  Cardiovascular:     Rate and Rhythm: Regular rhythm. Tachycardia present.     Pulses: Intact distal pulses.     Heart sounds: No murmur heard.   Pulmonary:     Effort: Pulmonary effort is normal. No respiratory distress.     Breath sounds: Normal breath sounds. No wheezing  or rales.  Abdominal:     General: There is distension.     Palpations: Abdomen is soft.     Tenderness: There is no abdominal tenderness. There is no guarding or rebound.  Genitourinary:    Comments: Dark brown stool Musculoskeletal:        General: No tenderness. Normal range of motion.     Cervical back: Normal range  of motion and neck supple.     Right lower leg: Edema present.     Left lower leg: Edema present.     Comments: 1+ pitting edema in bilateral lower extremities  Skin:    General: Skin is warm and dry.     Coloration: Skin is pale.     Findings: No erythema or rash.  Neurological:     Mental Status: He is alert and oriented to person, place, and time. Mental status is at baseline.  Psychiatric:        Mood and Affect: Mood and affect and mood normal.        Behavior: Behavior normal.        Thought Content: Thought content normal.     ED Results / Procedures / Treatments   Labs (all labs ordered are listed, but only abnormal results are displayed) Labs Reviewed  CBC WITH DIFFERENTIAL/PLATELET - Abnormal; Notable for the following components:      Result Value   RBC 2.33 (*)    Hemoglobin 5.6 (*)    HCT 20.7 (*)    MCH 24.0 (*)    MCHC 27.1 (*)    RDW 20.9 (*)    All other components within normal limits  COMPREHENSIVE METABOLIC PANEL - Abnormal; Notable for the following components:   Sodium 134 (*)    Glucose, Bld 200 (*)    BUN 7 (*)    Calcium 8.0 (*)    Albumin 2.7 (*)    AST 64 (*)    All other components within normal limits  POC OCCULT BLOOD, ED  TYPE AND SCREEN  ABO/RH  PREPARE RBC (CROSSMATCH)    EKG None  Radiology No results found.  Procedures Procedures   Medications Ordered in ED Medications - No data to display  ED Course  I have reviewed the triage vital signs and the nursing notes.  Pertinent labs & imaging results that were available during my care of the patient were reviewed by me and considered in my medical  decision making (see chart for details).    MDM Rules/Calculators/A&P                          Patient is a 60 year old male presenting today with concern for anemia and potentially hyponatremia.  Patient did have black tarry stools for a week last week that stopped a few days ago.  He has felt generally weak and had no energy.  He is in no acute distress on exam but does have mild tachycardia of 102.  He is satting normally and denies any chest pain or shortness of breath at this time but does have some exertional dyspnea.  He takes ibuprofen products almost every day does drink beer daily but denies history of withdrawal when he does not drink.  Had a colonoscopy approximately 8 years ago which was within normal limits.  Has never had an endoscopy.  Saw his doctor on Thursday and reported a hemoglobin of may be 4.9 and sodium that was low.  Patient does drink excessive water has a low sodium diet and with alcohol use all things that could be concerning for hyponatremia.  Patient is pale on exam and with an Onyx stools also concern for anemia.  Labs are pending.  12:30 PM Labs today with a hemoglobin of 5.6 but normal platelet count and white count, CMP with normal sodium 134 today and renal function is intact.  Hemoccult neg  and EKG without acute findings.  Will transfuse and admit for further care.  Pt also given IV protonix.  MDM Number of Diagnoses or Management Options   Amount and/or Complexity of Data Reviewed Clinical lab tests: ordered and reviewed Tests in the radiology section of CPT: ordered and reviewed Tests in the medicine section of CPT: ordered and reviewed Decide to obtain previous medical records or to obtain history from someone other than the patient: yes Obtain history from someone other than the patient: no Review and summarize past medical records: yes Discuss the patient with other providers: yes Independent visualization of images, tracings, or specimens:  yes   CRITICAL CARE Performed by: Elya Tarquinio Total critical care time: 30 minutes Critical care time was exclusive of separately billable procedures and treating other patients. Critical care was necessary to treat or prevent imminent or life-threatening deterioration. Critical care was time spent personally by me on the following activities: development of treatment plan with patient and/or surrogate as well as nursing, discussions with consultants, evaluation of patient's response to treatment, examination of patient, obtaining history from patient or surrogate, ordering and performing treatments and interventions, ordering and review of laboratory studies, ordering and review of radiographic studies, pulse oximetry and re-evaluation of patient's condition.  Final Clinical Impression(s) / ED Diagnoses Final diagnoses:  Symptomatic anemia    Rx / DC Orders ED Discharge Orders    None       Gwyneth SproutPlunkett, Jyll Tomaro, MD 10/06/20 1243

## 2020-10-06 NOTE — H&P (View-Only) (Signed)
Referring Provider:  [facility]         Primary Care Physician:  [facility] Primary Gastroenterologist: Previously Dr. Sharlett Iles             We were asked to see this patient for:   GI bleed               Attending physician's note   I have taken a history, examined the patient and reviewed the chart. I agree with the Advanced Practitioner's note, impression and recommendations.  37 yr M admitted with severe symptomatic anemia. H/o intermittent melena He takes daily Ibuprofen and Alkaseltzer Hgb 5.6, transfuse to >7 No active bleeding Will plan for EGD once he is adequately resuscitated Avoid NSAID's PPI twice daily F/u Iron panel, Ferritin, B12 and folate  If EGD is negative will need colonoscopy to exclude any neoplastic lesion  The risks and benefits as well as alternatives of endoscopic procedure(s) have been discussed and reviewed. All questions answered. The patient agrees to proceed.   The patient was provided an opportunity to ask questions and all were answered. The patient agreed with the plan and demonstrated an understanding of the instructions.  [name] , MD [phone]    ASSESSMENT / PLAN:   # 60 yo male with recent GI bleed and profound [place] anemia in setting of daily Ibuprofen use.  Black stool and black emesis for a few days in January then recurrent black stools two weeks ago ( resolved two days ago). Hgb 5.6 in ED.  Brown stool on EDP's exam today. Rule out PUD in setting of NSAIDs.  --EGD tomorrow. The risks and benefits of EGD were discussed and the patient agrees to proceed.  --Blood transfusion has already been ordered --Will obtain iron studies  # Hypoalbuminemia, intermittently elevated alk phos, thrombocytopenia ( chronic) in setting of Etoh use. Need to consider chronic liver disease --Obtain INR  # Bilateral lower extremity edema, started in January. His albumin is 2.7 which may be contributing  factor. He has also been short of breath but has concurrent severe anemia. Rule out underlying cardiac disease, liver disease.      HPI:                                                                                                                             Chief Complaint:  Weakness, black stool  Nathan Roberts is a 60 y.o. male with history of diverticulosis, HTN, pre diabetes on metformin   In January patient vomited black emesis x 3 and also passed black watery stool for a few days. He felt weak for a while but was able to regain his strength. Then two weeks ago he began passing black stool again. Stools returned to normal yesterday. He has been [facility] and labs last week at PCP's office last week showed hgb of around 4.9. Patient was advised to go to ED but didn't come  until today. He hasn't had any associated abdominal pain. No chronic GI problems. Pantoprazole on home med list but patient says it was just started last week by PCP ( ? For presumed GI bleed). Meloxicam on home list but he only took it for a week in October. He has taken a daily Advil for years for foot pain.    Nathan Roberts began having bilateral lower extremity swelling in January around the same time he was passing dark stools. He has gained 10 to 15 pounds over the last few months. No known cardiac problems. No known liver disease.   Patient consumes 5-6 beers / day for many years.    PREVIOUS ENDOSCOPIC EVALUATIONS / PERTINENT STUDIES   June 2013 screening colonoscopy --diverticulosis --no polyps or cancer  Past Medical History:  Diagnosis Date  . Allergy   . Arthritis   . Carpal tunnel syndrome   . GERD (gastroesophageal reflux disease)   . Hyperlipidemia   . Hypertension   . Pre-diabetes     Past Surgical History:  Procedure Laterality Date  . abscess     rectal area  . CARPAL TUNNEL RELEASE     left  . CERVICAL FUSION    . SPINE SURGERY      Prior to Admission medications   Medication Sig Start Date  End Date Taking? Authorizing Provider  amLODipine (NORVASC) 10 MG tablet TAKE 1 TABLET BY MOUTH EVERY DAY Patient taking differently: Take 10 mg by mouth. 12/14/18  Yes McVey, Gelene Mink, PA-C  fluticasone (FLONASE) 50 MCG/ACT nasal spray Place into both nostrils daily.   Yes [provider]  losartan (COZAAR) 100 MG tablet TAKE 1/2 TABLET (50 MG TOTAL) BY MOUTH DAILY. Patient taking differently: Take 50 mg by mouth daily. 03/21/19  Yes Stallings, Zoe A, MD  meloxicam (MOBIC) 15 MG tablet Take 15 mg by mouth daily.   Yes [provider]  metFORMIN (GLUCOPHAGE) 500 MG tablet Take 500 mg by mouth 2 (two) times daily with a meal.   Yes [provider]  mirtazapine (REMERON) 15 MG tablet Take 15 mg by mouth at bedtime. 09/14/20  Yes [provider]  Multiple Vitamins-Minerals (MULTIVITAMIN WITH MINERALS) tablet Take 1 tablet by mouth daily.   Yes [provider]  pantoprazole (PROTONIX) 40 MG tablet Take 40 mg by mouth daily. 09/30/20  Yes [provider]  olmesartan (BENICAR) 40 MG tablet Take 40 mg by mouth daily. Patient not taking: No sig reported 09/30/20   [provider]    Current Facility-Administered Medications  Medication Dose Route Frequency Provider Last Rate Last Admin  . 0.9 %  sodium chloride infusion (Manually program via Guardrails IV Fluids)   Intravenous Once Harold Hedge, MD      . 0.9 %  sodium chloride infusion   Intravenous Continuous Harold Hedge, MD      . folic acid (FOLVITE) tablet 1 mg  1 mg Oral Daily Harold Hedge, MD   1 mg at 10/06/20 1407  . hydrALAZINE (APRESOLINE) injection 10 mg  10 mg Intravenous Q8H PRN Harold Hedge, MD      . LORazepam (ATIVAN) tablet 1-4 mg  1-4 mg Oral Q1H PRN Harold Hedge, MD       Or  . LORazepam (ATIVAN) injection 1-4 mg  1-4 mg Intravenous Q1H PRN Harold Hedge, MD      . multivitamin with minerals tablet 1 tablet  1 tablet Oral Daily Harold Hedge, MD  1  tablet at 10/06/20 1437  . ondansetron (ZOFRAN) tablet 4 mg  4 mg Oral Q6H PRN Harold Hedge, MD       Or  . ondansetron [facility]) injection 4 mg  4 mg Intravenous Q6H PRN Harold Hedge, MD      . pantoprazole (PROTONIX) injection 40 mg  40 mg Intravenous Q12H Harold Hedge, MD      . thiamine tablet 100 mg  100 mg Oral Daily Harold Hedge, MD   100 mg at 10/06/20 1437   Or  . thiamine (B-1) injection 100 mg  100 mg Intravenous Daily Harold Hedge, MD       Current Outpatient Medications  Medication Sig Dispense Refill  . amLODipine (NORVASC) 10 MG tablet TAKE 1 TABLET BY MOUTH EVERY DAY (Patient taking differently: Take 10 mg by mouth.) 90 tablet 0  . fluticasone (FLONASE) 50 MCG/ACT nasal spray Place into both nostrils daily.    Marland Kitchen losartan (COZAAR) 100 MG tablet TAKE 1/2 TABLET (50 MG TOTAL) BY MOUTH DAILY. (Patient taking differently: Take 50 mg by mouth daily.) 45 tablet 2  . meloxicam (MOBIC) 15 MG tablet Take 15 mg by mouth daily.    . metFORMIN (GLUCOPHAGE) 500 MG tablet Take 500 mg by mouth 2 (two) times daily with a meal.    . mirtazapine (REMERON) 15 MG tablet Take 15 mg by mouth at bedtime.    . Multiple Vitamins-Minerals (MULTIVITAMIN WITH MINERALS) tablet Take 1 tablet by mouth daily.    . pantoprazole (PROTONIX) 40 MG tablet Take 40 mg by mouth daily.    Marland Kitchen olmesartan (BENICAR) 40 MG tablet Take 40 mg by mouth daily. (Patient not taking: No sig reported)      Allergies as of 10/06/2020 - Review Complete 10/06/2020  Allergen Reaction Noted  . Cephalexin Rash 08/12/2017  . Latex Hives and Rash 11/01/2011    Family History  Problem Relation Age of Onset  . Cancer Mother 54       lung--smoker  . Early death Mother   . Atrial fibrillation Father   . Heart disease Father   . COPD Father   . Hyperlipidemia Father   . Hypertension Father   . Colon cancer Neg Hx   . Esophageal cancer Neg Hx   . Stomach cancer Neg Hx   . Rectal cancer Neg Hx     Social History    Socioeconomic History  . Marital status: Married    Spouse name: Not on file  . Number of children: Not on file  . Years of education: Not on file  . Highest education level: Not on file  Occupational History  . Not on file  Tobacco Use  . Smoking status: Former Smoker    Types: Cigarettes    Quit date: 12/08/1996    Years since quitting: 23.8  . Smokeless tobacco: Never Used  Vaping Use  . Vaping Use: Never used  Substance and Sexual Activity  . Alcohol use: Yes    Alcohol/week: 9.0 standard drinks    Types: 2 Glasses of wine, 5 Cans of beer, 1 Shots of liquor, 1 Standard drinks or equivalent per week  . Drug use: No  . Sexual activity: Not Currently  Other Topics Concern  . Not on file  Social History Narrative  . Not on file   Social Determinants of Health   Financial Resource Strain: Not on file  Food Insecurity: Not on file  Transportation Needs: Not on  file  Physical Activity: Not on file  Stress: Not on file  Social Connections: Not on file  Intimate Partner Violence: Not on file    Review of Systems: Positive for dizziness, SHOB with exertion, lower extremity swelling. All other systems reviewed and negative except where noted in HPI.   OBJECTIVE:    Physical Exam: Vital signs in last 24 hours: Temp:  [97.5 F (36.4 C)] 97.5 F (36.4 C) (03/09 0956) Pulse Rate:  [86-107] 100 (03/09 1410) Resp:  [14-21] 19 (03/09 1300) BP: (123-160)/(60-75) 127/61 (03/09 1410) SpO2:  [98 %-100 %] 100 % (03/09 1300)   General:   Alert  male in NAD Psych:  Pleasant, cooperative. Normal mood and affect. Eyes:  Pupils equal, sclera clear, no icterus.   Conjunctiva pink. Ears:  Normal auditory acuity. Nose:  No deformity, discharge,  or lesions. Neck:  Supple; no masses Lungs:  Clear throughout to auscultation.   No wheezes, crackles, or rhonchi.  Heart:  Regular rate and rhythm; murmur is present, 3+ bilateral lower extremity edema Abdomen:  Soft, non-distended,  nontender, BS active, no palp mass   Rectal:  Deferred  Msk:  Symmetrical without gross deformities. . Neurologic:  Alert and  oriented x4;  grossly normal neurologically. Skin:  Intact without significant lesions or rashes.   Scheduled inpatient medications . sodium chloride   Intravenous Once  . folic acid  1 mg Oral Daily  . multivitamin with minerals  1 tablet Oral Daily  . pantoprazole (PROTONIX) IV  40 mg Intravenous Q12H  . thiamine  100 mg Oral Daily   Or  . thiamine  100 mg Intravenous Daily      Intake/Output from previous day: No intake/output data recorded. Intake/Output this shift: No intake/output data recorded.   Lab Results: Recent Labs    10/06/20 1014  WBC 5.5  HGB 5.6*  HCT 20.7*  PLT 170   BMET Recent Labs    10/06/20 1014  NA 134*  K 4.6  CL 103  CO2 22  GLUCOSE 200*  BUN 7*  CREATININE 0.81  CALCIUM 8.0*   LFT Recent Labs    10/06/20 1014  PROT 6.7  ALBUMIN 2.7*  AST 64*  ALT 39  ALKPHOS 122  BILITOT 0.9   PT/INR No results for input(s): LABPROT, INR in the last 72 hours. Hepatitis Panel No results for input(s): HEPBSAG, HCVAB, HEPAIGM, HEPBIGM in the last 72 hours.   . CBC Latest Ref Rng & Units 10/06/2020 05/27/2018 11/28/2017  WBC 4.0 - 10.5 K/uL 5.5 5.8 5.0  Hemoglobin 13.0 - 17.0 g/dL 5.6(LL) 12.2(L) 12.2(L)  Hematocrit 39.0 - 52.0 % 20.7(L) 36.2(L) 36.3(L)  Platelets 150 - 400 K/uL 170 116(L) 118(L)    . CMP Latest Ref Rng & Units 10/06/2020 09/24/2020 05/27/2018  Glucose 70 - 99 mg/dL 200(H) 161(H) 90  BUN 8 - 23 mg/dL 7(L) 20 13  Creatinine 0.61 - 1.24 mg/dL 0.81 0.81 0.73(L)  Sodium 135 - 145 mmol/L 134(L) 133(L) 133(L)  Potassium 3.5 - 5.1 mmol/L 4.6 4.1 4.0  Chloride 98 - 111 mmol/L 103 103 96  CO2 22 - 32 mmol/L 22 20(L) 23  Calcium 8.9 - 10.3 mg/dL 8.0(L) 8.0(L) 9.2  Total Protein 6.5 - 8.1 g/dL 6.7 - 7.6  Total Bilirubin 0.3 - 1.2 mg/dL 0.9 - 0.7  Alkaline Phos 38 - 126 U/L 122 - 172(H)  AST 15 - 41 U/L  64(H) - 58(H)  ALT 0 - 44 U/L 39 - 36  Studies/Results: No results found.  Principal Problem:   Symptomatic anemia Active Problems:   Hypertension   Hyperlipidemia   Bilateral lower extremity edema    Tye Savoy, NP-C @  10/06/2020, 3:15 PM

## 2020-10-06 NOTE — H&P (Addendum)
History and Physical        Hospital Admission Note Date: 10/06/2020  Patient name: Nathan Roberts Medical record number: [id number] Date of birth: [birth year] Age: 60 y.o. Gender: male  PCP: [facility] [place]   Chief Complaint    Chief Complaint  Patient presents with  . Abnormal Lab  . GI Bleeding      HPI:   This is a 60 year old male with past medical history of hypertension, hyperlipidemia, GERD who presented to the ED with generalized fatigue, exertional dyspnea, lightheadedness and dark tarry stools intermittently for 2 weeks.  States that 2 weeks ago he had noticed dark black tarry stools that lasted about a week and resolved but he began having his presenting symptoms soon after.  No chest pain, nausea or vomiting.  Admits to ibuprofen daily and 5-6 beers daily as well but denies a history of withdrawal symptoms, most recent drink was yesterday.  Went to his doctor this past Thursday for a routine visit but mention the symptoms and had routine lab work.  PCP called him later that day to report low hemoglobin and low sodium and advised him to go to the hospital however he was hesitant to come to the ED so he started eating foods with a lot of iron.  Unfortunately, he did not have any improvement in his symptoms and presented to the ED today.  This happened one other time in December 2021 but did not seek care at the time and it took about 3 weeks for him to feel back to normal.  No known history of cirrhosis, GI bleed, not on anticoagulation.   ED Course: Afebrile, hemodynamically stable, on room air. Notable Labs: Sodium 134, K4.6, glucose 200, BUN 7, creatinine 0.81, WBC 5.5, Hb 5.6, platelets 170, FOBT negative.  COVID-19 pending. Patient received 2 units PRBCs, Protonix 40 mg IV x1.  GI was consulted by the ED physician.   Vitals:   10/06/20 1245  10/06/20 1300  BP: 132/67 125/63  Pulse: 95 91  Resp: 17 19  Temp:    SpO2: 100% 100%     Review of Systems:  Review of Systems  All other systems reviewed and are negative.   Medical/Social/Family History   Past Medical History: Past Medical History:  Diagnosis Date  . Allergy   . Arthritis   . Carpal tunnel syndrome   . GERD (gastroesophageal reflux disease)   . Hyperlipidemia   . Hypertension   . Pre-diabetes     Past Surgical History:  Procedure Laterality Date  . abscess     rectal area  . CARPAL TUNNEL RELEASE     left  . CERVICAL FUSION    . SPINE SURGERY      Medications: Prior to Admission medications   Medication Sig Start Date End Date Taking? Authorizing Provider  amLODipine (NORVASC) 10 MG tablet TAKE 1 TABLET BY MOUTH EVERY DAY Patient taking differently: Take 10 mg by mouth. 12/14/18  Yes McVey, Madelaine Bhat, PA-C  fluticasone (FLONASE) 50 MCG/ACT nasal spray Place into both nostrils daily.   Yes [provider]  losartan (COZAAR) 100 MG tablet TAKE 1/2 TABLET (50 MG TOTAL) BY MOUTH DAILY. Patient taking  differently: Take 50 mg by mouth daily. 03/21/19  Yes Stallings, Zoe A, MD  meloxicam (MOBIC) 15 MG tablet Take 15 mg by mouth daily.   Yes [provider]  metFORMIN (GLUCOPHAGE) 500 MG tablet Take 500 mg by mouth 2 (two) times daily with a meal.   Yes [provider]  mirtazapine (REMERON) 15 MG tablet Take 15 mg by mouth at bedtime. 09/14/20  Yes [provider]  Multiple Vitamins-Minerals (MULTIVITAMIN WITH MINERALS) tablet Take 1 tablet by mouth daily.   Yes [provider]  pantoprazole (PROTONIX) 40 MG tablet Take 40 mg by mouth daily. 09/30/20  Yes [provider]  olmesartan (BENICAR) 40 MG tablet Take 40 mg by mouth daily. Patient not taking: No sig reported 09/30/20   [provider]    Allergies:   Allergies  Allergen Reactions  . Cephalexin Rash  . Latex Hives and Rash     Social History:  reports that he quit smoking about 23 years ago. His smoking use included cigarettes. He has never used smokeless tobacco. He reports current alcohol use of about 9.0 standard drinks of alcohol per week. He reports that he does not use drugs.  Family History: Family History  Problem Relation Age of Onset  . Cancer Mother 86       lung--smoker  . Early death Mother   . Atrial fibrillation Father   . Heart disease Father   . COPD Father   . Hyperlipidemia Father   . Hypertension Father   . Colon cancer Neg Hx   . Esophageal cancer Neg Hx   . Stomach cancer Neg Hx   . Rectal cancer Neg Hx      Objective   Physical Exam: Blood pressure 125/63, pulse 91, temperature (!) 97.5 F (36.4 C), temperature source Oral, resp. rate 19, SpO2 100 %.  Physical Exam Vitals and nursing note reviewed.  Constitutional:      Appearance: Normal appearance.  HENT:     Head: Normocephalic and atraumatic.  Eyes:     Conjunctiva/sclera: Conjunctivae normal.  Cardiovascular:     Rate and Rhythm: Normal rate and regular rhythm.  Pulmonary:     Effort: Pulmonary effort is normal.     Breath sounds: Normal breath sounds.  Abdominal:     General: Abdomen is flat.     Palpations: Abdomen is soft.  Musculoskeletal:     Right lower leg: Edema present.     Left lower leg: Edema present.     Comments: 2+ bilateral lower extremity edema with some erythema of RLE.  Calf tenderness bilaterally  Skin:    Coloration: Skin is pale. Skin is not jaundiced.  Neurological:     Mental Status: He is alert. Mental status is at baseline.  Psychiatric:        Mood and Affect: Mood normal.        Behavior: Behavior normal.     LABS on Admission: I have personally reviewed all the labs and imaging below    Basic Metabolic Panel: Recent Labs  Lab 10/06/20 1014  NA 134*  K 4.6  CL 103  CO2 22  GLUCOSE 200*  BUN 7*  CREATININE 0.81  CALCIUM 8.0*   Liver Function Tests: Recent  Labs  Lab 10/06/20 1014  AST 64*  ALT 39  ALKPHOS 122  BILITOT 0.9  PROT 6.7  ALBUMIN 2.7*   No results for input(s): LIPASE, AMYLASE in the last 168 hours. No results for input(s): AMMONIA  in the last 168 hours. CBC: Recent Labs  Lab 10/06/20 1014  WBC 5.5  NEUTROABS 3.3  HGB 5.6*  HCT 20.7*  MCV 88.8  PLT 170   Cardiac Enzymes: No results for input(s): CKTOTAL, CKMB, CKMBINDEX, TROPONINI in the last 168 hours. BNP: Invalid input(s): POCBNP CBG: No results for input(s): GLUCAP in the last 168 hours.  Radiological Exams on Admission:  No results found.    EKG: normal EKG, normal sinus rhythm   A & P   Principal Problem:   Symptomatic anemia Active Problems:   Hypertension   Hyperlipidemia   Bilateral lower extremity edema   1. Symptomatic anemia, suspect GI bleed a. Hb 5.6, hemodynamically stable on room air b. Concern for upper GI bleed in the setting of NSAID and alcohol use c. Protonix 40 mg IV twice daily d. Hold NSAIDs e. N.p.o. f. IV fluids g. GI consult h. PRBC transfusion with goal Hb >7.0  2. Alcohol abuse a. Encourage cessation b. CIWA protocol  3. Hypertension, stable a. Hold home p.o. meds b. As needed hydralazine  4. Hyperlipidemia a. Not on medication b. Lipid panel  5. History of Prediabetes a. Glucose 200 b. Check HbA1c  6. Bilateral lower extremity edema with calf tenderness a. Bilateral lower extremity ultrasound    DVT prophylaxis: SCDs   Code Status: Full Code  Diet: NPO Family Communication: Admission, patients condition and plan of care including tests being ordered have been discussed with the patient who indicates understanding and agrees with the plan and Code Status.  Disposition Plan: The appropriate patient status for this patient is OBSERVATION. Observation status is judged to be reasonable and necessary in order to provide the required intensity of service to ensure the patient's safety. The patient's  presenting symptoms, physical exam findings, and initial radiographic and laboratory data in the context of their medical condition is felt to place them at decreased risk for further clinical deterioration. Furthermore, it is anticipated that the patient will be medically stable for discharge from the hospital within 2 midnights of admission. The following factors support the patient status of observation.   " The patient's presenting symptoms include fatigue, dyspnea on exertion, abnormal lab work. " The physical exam findings include pallor, lower extremity edema. " The initial radiographic and laboratory data are concerning for anemia.    Status is: Observation  The patient remains OBS appropriate and will d/c before 2 midnights.  Dispo: The patient is from: Home              Anticipated d/c is to: Home              Patient currently is not medically stable to d/c.   Difficult to place patient No       Consultants  . GI  Procedures  . PRBC transfusion  Time Spent on Admission: 65 minutes    Jae Dire, DO [facility]  10/06/2020, 1:40 PM

## 2020-10-06 NOTE — ED Triage Notes (Signed)
Pt sent by [facility] for abnormal labs, showing anemia and low sodium. He reports passing black stools intermittently since Christmas.

## 2020-10-06 NOTE — Consult Note (Addendum)
Referring Provider:  [facility]         Primary Care Physician:  [facility] @ [place] Primary Gastroenterologist: Previously Dr. Sharlett Iles             We were asked to see this patient for:   GI bleed               Attending physician's note   I have taken a history, examined the patient and reviewed the chart. I agree with the Advanced Practitioner's note, impression and recommendations.  60 yr M admitted with severe symptomatic anemia. H/o intermittent melena He takes daily Ibuprofen and Alkaseltzer Hgb 5.6, transfuse to >7 No active bleeding Will plan for EGD once he is adequately resuscitated Avoid NSAID's PPI twice daily F/u Iron panel, Ferritin, B12 and folate  If EGD is negative will need colonoscopy to exclude any neoplastic lesion  The risks and benefits as well as alternatives of endoscopic procedure(s) have been discussed and reviewed. All questions answered. The patient agrees to proceed.   The patient was provided an opportunity to ask questions and all were answered. The patient agreed with the plan and demonstrated an understanding of the instructions.  [name] , MD [phone]    ASSESSMENT / PLAN:   # 60 yo male with recent GI bleed and profound [place] anemia in setting of daily Ibuprofen use.  Black stool and black emesis for a few days in January then recurrent black stools two weeks ago ( resolved two days ago). Hgb 5.6 in ED.  Brown stool on EDP's exam today. Rule out PUD in setting of NSAIDs.  --EGD tomorrow. The risks and benefits of EGD were discussed and the patient agrees to proceed.  --Blood transfusion has already been ordered --Will obtain iron studies  # Hypoalbuminemia, intermittently elevated alk phos, thrombocytopenia ( chronic) in setting of Etoh use. Need to consider chronic liver disease --Obtain INR  # Bilateral lower extremity edema, started in January. His albumin is 2.7 which may be contributing  factor. He has also been short of breath but has concurrent severe anemia. Rule out underlying cardiac disease, liver disease.      HPI:                                                                                                                             Chief Complaint:  Weakness, black stool  Nathan Roberts is a 60 y.o. male with history of diverticulosis, HTN, pre diabetes on metformin   In January patient vomited black emesis x 3 and also passed black watery stool for a few days. He felt weak for a while but was able to regain his strength. Then two weeks ago he began passing black stool again. Stools returned to normal yesterday. He has been [facility] and labs last week at PCP's office last week showed hgb of around 4.9. Patient was advised to go to ED but didn't come  until today. He hasn't had any associated abdominal pain. No chronic GI problems. Pantoprazole on home med list but patient says it was just started last week by PCP ( ? For presumed GI bleed). Meloxicam on home list but he only took it for a week in October. He has taken a daily Advil for years for foot pain.    Giomar began having bilateral lower extremity swelling in January around the same time he was passing dark stools. He has gained 10 to 15 pounds over the last few months. No known cardiac problems. No known liver disease.   Patient consumes 5-6 beers / day for many years.    PREVIOUS ENDOSCOPIC EVALUATIONS / PERTINENT STUDIES   June 2013 screening colonoscopy --diverticulosis --no polyps or cancer  Past Medical History:  Diagnosis Date  . Allergy   . Arthritis   . Carpal tunnel syndrome   . GERD (gastroesophageal reflux disease)   . Hyperlipidemia   . Hypertension   . Pre-diabetes     Past Surgical History:  Procedure Laterality Date  . abscess     rectal area  . CARPAL TUNNEL RELEASE     left  . CERVICAL FUSION    . SPINE SURGERY      Prior to Admission medications   Medication Sig Start Date  End Date Taking? Authorizing Provider  amLODipine (NORVASC) 10 MG tablet TAKE 1 TABLET BY MOUTH EVERY DAY Patient taking differently: Take 10 mg by mouth. 12/14/18  Yes McVey, Gelene Mink, PA-C  fluticasone (FLONASE) 50 MCG/ACT nasal spray Place into both nostrils daily.   Yes [provider]  losartan (COZAAR) 100 MG tablet TAKE 1/2 TABLET (50 MG TOTAL) BY MOUTH DAILY. Patient taking differently: Take 50 mg by mouth daily. 03/21/19  Yes Stallings, Zoe A, MD  meloxicam (MOBIC) 15 MG tablet Take 15 mg by mouth daily.   Yes [provider]  metFORMIN (GLUCOPHAGE) 500 MG tablet Take 500 mg by mouth 2 (two) times daily with a meal.   Yes [provider]  mirtazapine (REMERON) 15 MG tablet Take 15 mg by mouth at bedtime. 09/14/20  Yes [provider]  Multiple Vitamins-Minerals (MULTIVITAMIN WITH MINERALS) tablet Take 1 tablet by mouth daily.   Yes [provider]  pantoprazole (PROTONIX) 40 MG tablet Take 40 mg by mouth daily. 09/30/20  Yes [provider]  olmesartan (BENICAR) 40 MG tablet Take 40 mg by mouth daily. Patient not taking: No sig reported 09/30/20   [provider]    Current Facility-Administered Medications  Medication Dose Route Frequency Provider Last Rate Last Admin  . 0.9 %  sodium chloride infusion (Manually program via Guardrails IV Fluids)   Intravenous Once Harold Hedge, MD      . 0.9 %  sodium chloride infusion   Intravenous Continuous Harold Hedge, MD      . folic acid (FOLVITE) tablet 1 mg  1 mg Oral Daily Harold Hedge, MD   1 mg at 10/06/20 1407  . hydrALAZINE (APRESOLINE) injection 10 mg  10 mg Intravenous Q8H PRN Harold Hedge, MD      . LORazepam (ATIVAN) tablet 1-4 mg  1-4 mg Oral Q1H PRN Harold Hedge, MD       Or  . LORazepam (ATIVAN) injection 1-4 mg  1-4 mg Intravenous Q1H PRN Harold Hedge, MD      . multivitamin with minerals tablet 1 tablet  1 tablet Oral Daily Harold Hedge, MD  1  tablet at 10/06/20 1437  . ondansetron (ZOFRAN) tablet 4 mg  4 mg Oral Q6H PRN Harold Hedge, MD       Or  . ondansetron [facility]) injection 4 mg  4 mg Intravenous Q6H PRN Harold Hedge, MD      . pantoprazole (PROTONIX) injection 40 mg  40 mg Intravenous Q12H Harold Hedge, MD      . thiamine tablet 100 mg  100 mg Oral Daily Harold Hedge, MD   100 mg at 10/06/20 1437   Or  . thiamine (B-1) injection 100 mg  100 mg Intravenous Daily Harold Hedge, MD       Current Outpatient Medications  Medication Sig Dispense Refill  . amLODipine (NORVASC) 10 MG tablet TAKE 1 TABLET BY MOUTH EVERY DAY (Patient taking differently: Take 10 mg by mouth.) 90 tablet 0  . fluticasone (FLONASE) 50 MCG/ACT nasal spray Place into both nostrils daily.    Marland Kitchen losartan (COZAAR) 100 MG tablet TAKE 1/2 TABLET (50 MG TOTAL) BY MOUTH DAILY. (Patient taking differently: Take 50 mg by mouth daily.) 45 tablet 2  . meloxicam (MOBIC) 15 MG tablet Take 15 mg by mouth daily.    . metFORMIN (GLUCOPHAGE) 500 MG tablet Take 500 mg by mouth 2 (two) times daily with a meal.    . mirtazapine (REMERON) 15 MG tablet Take 15 mg by mouth at bedtime.    . Multiple Vitamins-Minerals (MULTIVITAMIN WITH MINERALS) tablet Take 1 tablet by mouth daily.    . pantoprazole (PROTONIX) 40 MG tablet Take 40 mg by mouth daily.    Marland Kitchen olmesartan (BENICAR) 40 MG tablet Take 40 mg by mouth daily. (Patient not taking: No sig reported)      Allergies as of 10/06/2020 - Review Complete 10/06/2020  Allergen Reaction Noted  . Cephalexin Rash 08/12/2017  . Latex Hives and Rash 11/01/2011    Family History  Problem Relation Age of Onset  . Cancer Mother 42       lung--smoker  . Early death Mother   . Atrial fibrillation Father   . Heart disease Father   . COPD Father   . Hyperlipidemia Father   . Hypertension Father   . Colon cancer Neg Hx   . Esophageal cancer Neg Hx   . Stomach cancer Neg Hx   . Rectal cancer Neg Hx     Social History    Socioeconomic History  . Marital status: Married    Spouse name: Not on file  . Number of children: Not on file  . Years of education: Not on file  . Highest education level: Not on file  Occupational History  . Not on file  Tobacco Use  . Smoking status: Former Smoker    Types: Cigarettes    Quit date: 12/08/1996    Years since quitting: 23.8  . Smokeless tobacco: Never Used  Vaping Use  . Vaping Use: Never used  Substance and Sexual Activity  . Alcohol use: Yes    Alcohol/week: 9.0 standard drinks    Types: 2 Glasses of wine, 5 Cans of beer, 1 Shots of liquor, 1 Standard drinks or equivalent per week  . Drug use: No  . Sexual activity: Not Currently  Other Topics Concern  . Not on file  Social History Narrative  . Not on file   Social Determinants of Health   Financial Resource Strain: Not on file  Food Insecurity: Not on file  Transportation Needs: Not on  file  Physical Activity: Not on file  Stress: Not on file  Social Connections: Not on file  Intimate Partner Violence: Not on file    Review of Systems: Positive for dizziness, SHOB with exertion, lower extremity swelling. All other systems reviewed and negative except where noted in HPI.   OBJECTIVE:    Physical Exam: Vital signs in last 24 hours: Temp:  [97.5 F (36.4 C)] 97.5 F (36.4 C) (03/09 0956) Pulse Rate:  [86-107] 100 (03/09 1410) Resp:  [14-21] 19 (03/09 1300) BP: (123-160)/(60-75) 127/61 (03/09 1410) SpO2:  [98 %-100 %] 100 % (03/09 1300)   General:   Alert  male in NAD Psych:  Pleasant, cooperative. Normal mood and affect. Eyes:  Pupils equal, sclera clear, no icterus.   Conjunctiva pink. Ears:  Normal auditory acuity. Nose:  No deformity, discharge,  or lesions. Neck:  Supple; no masses Lungs:  Clear throughout to auscultation.   No wheezes, crackles, or rhonchi.  Heart:  Regular rate and rhythm; murmur is present, 3+ bilateral lower extremity edema Abdomen:  Soft, non-distended,  nontender, BS active, no palp mass   Rectal:  Deferred  Msk:  Symmetrical without gross deformities. . Neurologic:  Alert and  oriented x4;  grossly normal neurologically. Skin:  Intact without significant lesions or rashes.   Scheduled inpatient medications . sodium chloride   Intravenous Once  . folic acid  1 mg Oral Daily  . multivitamin with minerals  1 tablet Oral Daily  . pantoprazole (PROTONIX) IV  40 mg Intravenous Q12H  . thiamine  100 mg Oral Daily   Or  . thiamine  100 mg Intravenous Daily      Intake/Output from previous day: No intake/output data recorded. Intake/Output this shift: No intake/output data recorded.   Lab Results: Recent Labs    10/06/20 1014  WBC 5.5  HGB 5.6*  HCT 20.7*  PLT 170   BMET Recent Labs    10/06/20 1014  NA 134*  K 4.6  CL 103  CO2 22  GLUCOSE 200*  BUN 7*  CREATININE 0.81  CALCIUM 8.0*   LFT Recent Labs    10/06/20 1014  PROT 6.7  ALBUMIN 2.7*  AST 64*  ALT 39  ALKPHOS 122  BILITOT 0.9   PT/INR No results for input(s): LABPROT, INR in the last 72 hours. Hepatitis Panel No results for input(s): HEPBSAG, HCVAB, HEPAIGM, HEPBIGM in the last 72 hours.   . CBC Latest Ref Rng & Units 10/06/2020 05/27/2018 11/28/2017  WBC 4.0 - 10.5 K/uL 5.5 5.8 5.0  Hemoglobin 13.0 - 17.0 g/dL 5.6(LL) 12.2(L) 12.2(L)  Hematocrit 39.0 - 52.0 % 20.7(L) 36.2(L) 36.3(L)  Platelets 150 - 400 K/uL 170 116(L) 118(L)    . CMP Latest Ref Rng & Units 10/06/2020 09/24/2020 05/27/2018  Glucose 70 - 99 mg/dL 200(H) 161(H) 90  BUN 8 - 23 mg/dL 7(L) 20 13  Creatinine 0.61 - 1.24 mg/dL 0.81 0.81 0.73(L)  Sodium 135 - 145 mmol/L 134(L) 133(L) 133(L)  Potassium 3.5 - 5.1 mmol/L 4.6 4.1 4.0  Chloride 98 - 111 mmol/L 103 103 96  CO2 22 - 32 mmol/L 22 20(L) 23  Calcium 8.9 - 10.3 mg/dL 8.0(L) 8.0(L) 9.2  Total Protein 6.5 - 8.1 g/dL 6.7 - 7.6  Total Bilirubin 0.3 - 1.2 mg/dL 0.9 - 0.7  Alkaline Phos 38 - 126 U/L 122 - 172(H)  AST 15 - 41 U/L  64(H) - 58(H)  ALT 0 - 44 U/L 39 - 36  Studies/Results: No results found.  Principal Problem:   Symptomatic anemia Active Problems:   Hypertension   Hyperlipidemia   Bilateral lower extremity edema    Tye Savoy, NP-C @  10/06/2020, 3:15 PM

## 2020-10-07 ENCOUNTER — Observation Stay (HOSPITAL_BASED_OUTPATIENT_CLINIC_OR_DEPARTMENT_OTHER): Payer: BC Managed Care – PPO

## 2020-10-07 ENCOUNTER — Observation Stay (HOSPITAL_COMMUNITY): Payer: BC Managed Care – PPO | Admitting: Anesthesiology

## 2020-10-07 ENCOUNTER — Encounter (HOSPITAL_COMMUNITY)
Admission: EM | Disposition: A | Discharge status: Home / Self Care | Payer: Self-pay | Source: Home / Self Care | Attending: Emergency Medicine

## 2020-10-07 ENCOUNTER — Encounter (HOSPITAL_COMMUNITY): Payer: Self-pay | Admitting: Internal Medicine

## 2020-10-07 DIAGNOSIS — R609 Edema, unspecified: Secondary | ICD-10-CM | POA: Diagnosis not present

## 2020-10-07 DIAGNOSIS — K7030 Alcoholic cirrhosis of liver without ascites: Secondary | ICD-10-CM | POA: Diagnosis not present

## 2020-10-07 DIAGNOSIS — I10 Essential (primary) hypertension: Secondary | ICD-10-CM | POA: Diagnosis not present

## 2020-10-07 DIAGNOSIS — R600 Localized edema: Secondary | ICD-10-CM | POA: Diagnosis not present

## 2020-10-07 DIAGNOSIS — K922 Gastrointestinal hemorrhage, unspecified: Secondary | ICD-10-CM | POA: Diagnosis present

## 2020-10-07 DIAGNOSIS — K3189 Other diseases of stomach and duodenum: Secondary | ICD-10-CM | POA: Diagnosis not present

## 2020-10-07 DIAGNOSIS — I8510 Secondary esophageal varices without bleeding: Secondary | ICD-10-CM | POA: Diagnosis not present

## 2020-10-07 DIAGNOSIS — Z7289 Other problems related to lifestyle: Secondary | ICD-10-CM

## 2020-10-07 DIAGNOSIS — K766 Portal hypertension: Secondary | ICD-10-CM

## 2020-10-07 DIAGNOSIS — K2100 Gastro-esophageal reflux disease with esophagitis, without bleeding: Secondary | ICD-10-CM

## 2020-10-07 DIAGNOSIS — D649 Anemia, unspecified: Secondary | ICD-10-CM | POA: Diagnosis not present

## 2020-10-07 DIAGNOSIS — Z789 Other specified health status: Secondary | ICD-10-CM

## 2020-10-07 HISTORY — PX: ESOPHAGEAL BANDING: SHX5518

## 2020-10-07 HISTORY — PX: ESOPHAGOGASTRODUODENOSCOPY (EGD) WITH PROPOFOL: SHX5813

## 2020-10-07 LAB — CBC
HCT: 26.1 % — ABNORMAL LOW (ref 39.0–52.0)
Hemoglobin: 7.4 g/dL — ABNORMAL LOW (ref 13.0–17.0)
MCH: 24.8 pg — ABNORMAL LOW (ref 26.0–34.0)
MCHC: 28.4 g/dL — ABNORMAL LOW (ref 30.0–36.0)
MCV: 87.6 fL (ref 80.0–100.0)
Platelets: 139 10*3/uL — ABNORMAL LOW (ref 150–400)
RBC: 2.98 MIL/uL — ABNORMAL LOW (ref 4.22–5.81)
RDW: 19.3 % — ABNORMAL HIGH (ref 11.5–15.5)
WBC: 6.4 10*3/uL (ref 4.0–10.5)
nRBC: 0 % (ref 0.0–0.2)

## 2020-10-07 LAB — IRON AND TIBC
Iron: 32 ug/dL — ABNORMAL LOW (ref 45–182)
Saturation Ratios: 7 % — ABNORMAL LOW (ref 17.9–39.5)
TIBC: 449 ug/dL (ref 250–450)
UIBC: 417 ug/dL

## 2020-10-07 LAB — FERRITIN: Ferritin: 17 ng/mL — ABNORMAL LOW (ref 24–336)

## 2020-10-07 LAB — BASIC METABOLIC PANEL
Anion gap: 6 (ref 5–15)
BUN: 9 mg/dL (ref 8–23)
CO2: 22 mmol/L (ref 22–32)
Calcium: 7.9 mg/dL — ABNORMAL LOW (ref 8.9–10.3)
Chloride: 104 mmol/L (ref 98–111)
Creatinine, Ser: 0.6 mg/dL — ABNORMAL LOW (ref 0.61–1.24)
GFR, Estimated: >60 mL/min (ref >60)
Glucose, Bld: 119 mg/dL — ABNORMAL HIGH (ref 70–99)
Potassium: 4.2 mmol/L (ref 3.5–5.1)
Sodium: 132 mmol/L — ABNORMAL LOW (ref 135–145)

## 2020-10-07 LAB — VITAMIN B12: Vitamin B-12: 1243 pg/mL — ABNORMAL HIGH (ref 180–914)

## 2020-10-07 LAB — FOLATE: Folate: 49.6 ng/mL (ref >5.9)

## 2020-10-07 LAB — HEMOGLOBIN A1C
Hgb A1c MFr Bld: 5.2 % (ref 4.8–5.6)
Mean Plasma Glucose: 102.54 mg/dL

## 2020-10-07 LAB — PREPARE RBC (CROSSMATCH)

## 2020-10-07 SURGERY — ESOPHAGOGASTRODUODENOSCOPY (EGD) WITH PROPOFOL
Anesthesia: Monitor Anesthesia Care

## 2020-10-07 MED ORDER — ALUM & MAG HYDROXIDE-SIMETH 200-200-20 MG/5ML PO SUSP
30.0000 mL | Freq: Once | ORAL | Status: AC
  Administered 2020-10-07: 30 mL via ORAL
  Filled 2020-10-07: qty 30

## 2020-10-07 MED ORDER — PROPOFOL 500 MG/50ML IV EMUL
INTRAVENOUS | Status: DC | PRN
  Administered 2020-10-07: 150 ug/kg/min via INTRAVENOUS

## 2020-10-07 MED ORDER — LIDOCAINE VISCOUS HCL 2 % MT SOLN
15.0000 mL | Freq: Once | OROMUCOSAL | Status: AC
  Administered 2020-10-07: 15 mL via ORAL

## 2020-10-07 MED ORDER — DICYCLOMINE HCL 10 MG/5ML PO SOLN
10.0000 mg | Freq: Three times a day (TID) | ORAL | Status: DC
  Administered 2020-10-07 – 2020-10-08 (×3): 10 mg via ORAL
  Filled 2020-10-07 (×9): qty 5

## 2020-10-07 MED ORDER — PROPOFOL 10 MG/ML IV BOLUS
INTRAVENOUS | Status: DC | PRN
  Administered 2020-10-07: 80 mg via INTRAVENOUS
  Administered 2020-10-07: 50 mg via INTRAVENOUS
  Administered 2020-10-07: 20 mg via INTRAVENOUS

## 2020-10-07 MED ORDER — LIDOCAINE VISCOUS HCL 2 % MT SOLN
OROMUCOSAL | Status: AC
  Filled 2020-10-07: qty 15

## 2020-10-07 MED ORDER — SODIUM CHLORIDE 0.9% IV SOLUTION: Freq: Once | INTRAVENOUS | Status: AC

## 2020-10-07 MED ORDER — SIMETHICONE 40 MG/0.6ML PO SUSP (UNIT DOSE)
ORAL | Status: AC
  Filled 2020-10-07: qty 0.6

## 2020-10-07 MED ORDER — PROPRANOLOL HCL 20 MG PO TABS
10.0000 mg | ORAL_TABLET | Freq: Two times a day (BID) | ORAL | Status: DC
  Administered 2020-10-07 – 2020-10-08 (×2): 10 mg via ORAL
  Filled 2020-10-07 (×2): qty 1

## 2020-10-07 MED ORDER — LIDOCAINE 2% (20 MG/ML) 5 ML SYRINGE
INTRAMUSCULAR | Status: DC | PRN
  Administered 2020-10-07: 100 mg via INTRAVENOUS

## 2020-10-07 MED ORDER — ALUM & MAG HYDROXIDE-SIMETH 200-200-20 MG/5ML PO SUSP: 30.0000 mL | Freq: Once | ORAL | Status: DC

## 2020-10-07 MED ORDER — LIDOCAINE VISCOUS HCL 2 % MT SOLN: 15.0000 mL | Freq: Once | OROMUCOSAL | Status: DC

## 2020-10-07 MED ORDER — LACTATED RINGERS IV SOLN: INTRAVENOUS | Status: DC | PRN

## 2020-10-07 SURGICAL SUPPLY — 14 items

## 2020-10-07 NOTE — Anesthesia Postprocedure Evaluation (Signed)
Anesthesia Post Note  Patient: Nathan Roberts  Procedure(s) Performed: ESOPHAGOGASTRODUODENOSCOPY (EGD) WITH PROPOFOL (N/A ) ESOPHAGEAL BANDING     Patient location during evaluation: PACU Anesthesia Type: MAC Level of consciousness: awake and alert Pain management: pain level controlled Vital Signs Assessment: post-procedure vital signs reviewed and stable Respiratory status: spontaneous breathing, nonlabored ventilation, respiratory function stable and patient connected to nasal cannula oxygen Cardiovascular status: stable and blood pressure returned to baseline Postop Assessment: no apparent nausea or vomiting Anesthetic complications: no   No complications documented.  Last Vitals:  Vitals:   10/07/20 1314 10/07/20 1451  BP: (!) 145/69 134/68  Pulse: 78 93  Resp: 19 19  Temp: 36.4 C 37.1 C  SpO2: 98% 100%    Last Pain:  Vitals:   10/07/20 1451  TempSrc: Oral  PainSc: 0-No pain                 Hassan Blackshire S

## 2020-10-07 NOTE — Interval H&P Note (Signed)
History and Physical Interval Note:  10/07/2020 2:26 PM  Nathan Roberts  has presented today for surgery, with the diagnosis of gastrointestinal bleeding, anemia.  The various methods of treatment have been discussed with the patient and family. After consideration of risks, benefits and other options for treatment, the patient has consented to  Procedure(s): ESOPHAGOGASTRODUODENOSCOPY (EGD) WITH PROPOFOL (N/A) as a surgical intervention.  The patient's history has been reviewed, patient examined, no change in status, stable for surgery.  I have reviewed the patient's chart and labs.  Questions were answered to the patient's satisfaction.     Kristian Hazzard

## 2020-10-07 NOTE — Progress Notes (Signed)
[facility]                                                                              Patient Demographics  Nathan Roberts, is a 60 y.o. male, DOB - 1960-08-02, POE:[id number]  Admit date - 10/06/2020   Admitting Physician Nathan Dire, MD  Outpatient Primary MD for the patient is Nathan Soho, PA-C  Outpatient specialists:   LOS - 0  days   Medical records reviewed and are as summarized below:    Chief Complaint  Patient presents with  . Abnormal Lab  . GI Bleeding       Brief summary   Patient is a 60 year old male with hypertension, hyperlipidemia, GERD presented to ED with generalized fatigue, exertional dyspnea, lightheadedness and dark tarry stools intermittently for last 2 weeks.  No chest pain, nausea or vomiting.  Patient reports that he takes ibuprofen daily, 5-6 beers daily as well, last drink yesterday.  Patient went to his PCP and was reported to have low hemoglobin, low sodium and recommended him to go to ED. Hemoglobin 5.6 at the time of admission, MCV 88.8  Patient was admitted for further work-up.  Assessment & Plan    Principal Problem: Acute symptomatic anemia, upper GI bleed in the setting of daily NSAID use, alcohol use -Hemoglobin 5.6 at the time of admission, transfused 2 units packed RBCs, -Hemoglobin still 7.4 this morning, will transfuse 1 unit -Continue PPI, GI consulted, plan for endoscopy today -Avoid NSAIDs  Active Problems:   Hypertension -BP currently stable, n.p.o., hold p.o. meds  Prior history of hyperlipidemia -Not on any medication, LDL 53, cholesterol 87    Bilateral lower extremity edema -Bilateral lower extremity ultrasound showed no DVT  Alcohol use -Currently not in any withdrawal symptoms, continue CIWA with Ativan as needed  Obesity Estimated body mass index is 30.28 kg/m as calculated from the following:   Height as of 09/21/20: 5\' 9"  (1.753 m).   Weight as of 09/21/20: 93 kg.  Code  Status: Full CODE STATUS DVT Prophylaxis:  Place and maintain sequential compression device Start: 10/06/20 1338 SCDs Start: 10/06/20 1332   Level of Care: Level of care: Telemetry Family Communication: Discussed all imaging results, lab results, explained to the patient    Disposition Plan:     Status is: Observation  The patient remains OBS appropriate and will d/c before 2 midnights.  Dispo: The patient is from: Home              Anticipated d/c is to: Home              Patient currently is not medically stable to d/c.  Need 1 more unit packed RBC transfusion, GI work-up pending   Difficult to place patient No      Time Spent in minutes   35 minutes  Procedures:  None  Consultants:   Nathan Roberts gastroenterology  Antimicrobials:   Anti-infectives (From admission, onward)   Start     Dose/Rate Route Frequency Ordered Stop   10/07/20 0600  clindamycin (CLEOCIN) IVPB 900 mg  Status:  Discontinued        900  mg 100 mL/hr over 30 Minutes Intravenous On call to O.R. 10/06/20 1332 10/06/20 1337         Medications  Scheduled Meds: . sodium chloride   Intravenous Once  . folic acid  1 mg Oral Daily  . multivitamin with minerals  1 tablet Oral Daily  . pantoprazole (PROTONIX) IV  40 mg Intravenous Q12H  . thiamine  100 mg Oral Daily   Or  . thiamine  100 mg Intravenous Daily   Continuous Infusions: PRN Meds:.hydrALAZINE, LORazepam **OR** LORazepam, ondansetron **OR** ondansetron (ZOFRAN) IV      Subjective:   Nathan Roberts was seen and examined today.  No acute complaints, no nausea or vomiting, no bleeding overnight. Patient denies dizziness, chest pain, shortness of breath, abdominal pain.  No acute events overnight.    Objective:   Vitals:   10/06/20 2042 10/06/20 2245 10/07/20 0048 10/07/20 0552  BP: 136/76 131/76 136/68 124/75  Pulse:  96 90 85  Resp:  18 (!) 21 17  Temp: 98.4 F (36.9 C) 99.9 F (37.7 C) 98.5 F (36.9 C) 97.9 F (36.6 C)   TempSrc: Oral Oral Oral Oral  SpO2: 100%  96% 97%    Intake/Output Summary (Last 24 hours) at 10/07/2020 1148 Last data filed at 10/07/2020 1100 Gross per 24 hour  Intake 630 ml  Output -  Net 630 ml     Wt Readings from Last 3 Encounters:  07/25/18 94.1 kg  05/27/18 92.4 kg  12/13/17 92.9 kg     Exam  General: Alert and oriented x 3, NAD  Cardiovascular: S1 S2 auscultated, no murmurs, RRR  Respiratory: Clear to auscultation bilaterally, no wheezing, rales or rhonchi  Gastrointestinal: Soft, nontender, nondistended, + bowel sounds  Ext: no pedal edema bilaterally  Neuro: no new deficits  Musculoskeletal: No digital cyanosis, clubbing  Skin: No rashes  Psych: Normal affect and demeanor, alert and oriented x3    Data Reviewed:  I have personally reviewed following labs and imaging studies  Micro Results Recent Results (from the past 240 hour(s))  SARS CORONAVIRUS 2 (TAT 6-24 HRS) Nasopharyngeal Nasopharyngeal Swab     Status: None   Collection Time: 10/06/20  1:32 PM   Specimen: Nasopharyngeal Swab  Result Value Ref Range Status   SARS Coronavirus 2 NEGATIVE NEGATIVE Final    Comment: (NOTE) SARS-CoV-2 target nucleic acids are NOT DETECTED.  The SARS-CoV-2 RNA is generally detectable in upper and lower respiratory specimens during the acute phase of infection. Negative results do not preclude SARS-CoV-2 infection, do not rule out co-infections with other pathogens, and should not be used as the sole basis for treatment or other patient management decisions. Negative results must be combined with clinical observations, patient history, and epidemiological information. The expected result is Negative.  Fact Sheet for Patients: HairSlick.no  Fact Sheet for Healthcare Providers: quierodirigir.com  This test is not yet approved or cleared by the [place] FDA and  has been authorized for detection  and/or diagnosis of SARS-CoV-2 by FDA under an Emergency Use Authorization (EUA). This EUA will remain  in effect (meaning this test can be used) for the duration of the COVID-19 declaration under Se ction 564(b)(1) of the Act, 21 U.S.C. section 360bbb-3(b)(1), unless the authorization is terminated or revoked sooner.  Performed at [facility], [address], [place], [place] [address]     Radiology Reports NCV with EMG(electromyography)  Result Date: 09/22/2020 Glendale Chard, DO     09/22/2020 11:58  AM [facility] [address] [place], [address]  [place], [place] [address] Tel: [phone] Fax:  [phone] Test Date:  09/22/2020 Patient: Hanif Radin DOB: [birth year] Physician: Nita Sickle, DO Sex: Male Height:  Ref Phys: Cindee Salt, MD ID#: [id number]   Technician:  Patient Complaints: This is a 60 year old man with history of C4-6 ACDF and left CTS release referred for evaluation of right hand numbness and tingling. NCV & EMG Findings: Extensive electrodiagnostic testing of the right upper extremity shows: 1. Right median sensory response shows prolonged latency (3.9 ms) and reduced amplitude (7.8 V).  Left median, left mixed palmar, and bilateral ulnar sensory responses are within normal limits.  2. Bilateral median and ulnar motor responses are within normal limits.  Of note, there is evidence of bilateral Martin-Gruber anastomosis as seen by a motor response stimulating at the ulnar-wrist and recording at the abductor pollicis brevis muscle.  3. There is no evidence of active or chronic motor axonal loss changes affecting any of the tested muscles.  Motor unit configuration and recruitment pattern is within normal limits. Impression: 1. 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  2. There is no evidence of a cervical radiculopathy. 3. Incidentally, there is evidence of bilateral  Martin-Gruber anastomoses, a normal anatomic variant. ___________________________ Nita Sickle, DO Nerve Conduction Studies Anti Sensory Summary Table  Stim Site NR Peak (ms) Norm Peak (ms) P-T Amp (V) Norm P-T Amp Left Median Anti Sensory (2nd Digit)  34C Wrist    3.2 <3.8 12.1 >10 Right Median Anti Sensory (2nd Digit)  34C Wrist    3.9 <3.8 7.8 >10 Left Ulnar Anti Sensory (5th Digit)  34C Wrist    3.1 <3.2 16.8 >5 Right Ulnar Anti Sensory (5th Digit)  34C Wrist    3.1 <3.2 15.2 >5 Motor Summary Table  Stim Site NR Onset (ms) Norm Onset (ms) O-P Amp (mV) Norm O-P Amp Site1 Site2 Delta-0 (ms) Dist (cm) Vel (m/s) Norm Vel (m/s) Left Median Motor (Abd Poll Brev)  34C Wrist    3.9 <4.0 5.4 >5 Elbow Wrist 6.0 31.0 52 >50 Elbow    9.9  4.1  Ulnar-wrist crossover Elbow 6.1 0.0   Ulnar-wrist crossover    3.8  2.2        Right Median Motor (Abd Poll Brev)  34C Wrist    3.6 <4.0 8.5 >5 Elbow Wrist 6.0 31.0 52 >50 Elbow    9.6  7.4  Ulnar-wrist crossover Elbow 5.5 0.0   Ulnar-wrist crossover    4.1  2.2        Left Ulnar Motor (Abd Dig Minimi)  34C Wrist    2.5 <3.1 9.0 >7 B Elbow Wrist 4.0 24.0 60 >50 B Elbow    6.5  7.9  A Elbow B Elbow 1.8 10.0 56 >50 A Elbow    8.3  7.4        Right Ulnar Motor (Abd Dig Minimi)  34C Wrist    2.4 <3.1 9.6 >7 B Elbow Wrist 4.1 23.0 56 >50 B Elbow    6.5  8.4  A Elbow B Elbow 1.7 10.0 59 >50 A Elbow    8.2  8.2        Comparison Summary Table  Stim Site NR Peak (ms) Norm Peak (ms) P-T Amp (V) Site1 Site2 Delta-P (ms) Norm Delta (ms) Left Median/Ulnar Palm Comparison (Wrist - 8cm)  34C Median [employer]  2.0 <2.2 26.7 Median Palm Ulnar Palm 0.2  Ulnar Palm    1.8 <2.2 13.8     EMG  Side Muscle Ins Act Fibs Psw Fasc Number Recrt Dur Dur. Amp Amp. Poly Poly. Comment Right 1stDorInt Nml Nml Nml Nml Nml Nml Nml Nml Nml Nml Nml Nml N/A Right Abd Poll Brev Nml Nml Nml Nml Nml Nml Nml Nml Nml Nml Nml Nml N/A Right PronatorTeres Nml Nml Nml Nml Nml Nml Nml Nml Nml Nml Nml Nml N/A Right Biceps  Nml Nml Nml Nml Nml Nml Nml Nml Nml Nml Nml Nml N/A Right Triceps Nml Nml Nml Nml Nml Nml Nml Nml Nml Nml Nml Nml N/A Right Deltoid Nml Nml Nml Nml Nml Nml Nml Nml Nml Nml Nml Nml N/A Left 1stDorInt Nml Nml Nml Nml Nml Nml Nml Nml Nml Nml Nml Nml N/A Left PronatorTeres Nml Nml Nml Nml Nml Nml Nml Nml Nml Nml Nml Nml N/A Left Biceps Nml Nml Nml Nml Nml Nml Nml Nml Nml Nml Nml Nml N/A Left Triceps Nml Nml Nml Nml Nml Nml Nml Nml Nml Nml Nml Nml N/A Left Deltoid Nml Nml Nml Nml Nml Nml Nml Nml Nml Nml Nml Nml N/A Waveforms:            VAS [place] LOWER EXTREMITY VENOUS (DVT)  Result Date: 10/07/2020  Lower Venous DVT Study Indications: Edema.  Risk Factors: None identified. Limitations: Poor ultrasound/tissue interface. Comparison Study: No prior studies. Performing Technologist: Chanda Busing RVT  Examination Guidelines: A complete evaluation includes B-mode imaging, spectral Doppler, color Doppler, and power Doppler as needed of all accessible portions of each vessel. Bilateral testing is considered an integral part of a complete examination. Limited examinations for reoccurring indications may be performed as noted. The reflux portion of the exam is performed with the patient in reverse Trendelenburg.  +---------+---------------+---------+-----------+----------+--------------+ RIGHT    CompressibilityPhasicitySpontaneityPropertiesThrombus Aging +---------+---------------+---------+-----------+----------+--------------+ CFV      Full           Yes      Yes                                 +---------+---------------+---------+-----------+----------+--------------+ SFJ      Full                                                        +---------+---------------+---------+-----------+----------+--------------+ FV Prox  Full                                                        +---------+---------------+---------+-----------+----------+--------------+ FV Mid   Full                                                         +---------+---------------+---------+-----------+----------+--------------+ FV DistalFull                                                        +---------+---------------+---------+-----------+----------+--------------+  PFV      Full                                                        +---------+---------------+---------+-----------+----------+--------------+ POP      Full           Yes      Yes                                 +---------+---------------+---------+-----------+----------+--------------+ PTV      Full                                                        +---------+---------------+---------+-----------+----------+--------------+ PERO     Full                                                        +---------+---------------+---------+-----------+----------+--------------+   +---------+---------------+---------+-----------+----------+--------------+ LEFT     CompressibilityPhasicitySpontaneityPropertiesThrombus Aging +---------+---------------+---------+-----------+----------+--------------+ CFV      Full           Yes      Yes                                 +---------+---------------+---------+-----------+----------+--------------+ SFJ      Full                                                        +---------+---------------+---------+-----------+----------+--------------+ FV Prox  Full                                                        +---------+---------------+---------+-----------+----------+--------------+ FV Mid   Full                                                        +---------+---------------+---------+-----------+----------+--------------+ FV DistalFull                                                        +---------+---------------+---------+-----------+----------+--------------+ PFV      Full                                                         +---------+---------------+---------+-----------+----------+--------------+  POP      Full           Yes      Yes                                 +---------+---------------+---------+-----------+----------+--------------+ PTV      Full                                                        +---------+---------------+---------+-----------+----------+--------------+ PERO     Full                                                        +---------+---------------+---------+-----------+----------+--------------+     Summary: RIGHT: - There is no evidence of deep vein thrombosis in the lower extremity.  - No cystic structure found in the popliteal fossa.  LEFT: - There is no evidence of deep vein thrombosis in the lower extremity.  - No cystic structure found in the popliteal fossa.  *See table(s) above for measurements and observations.    Preliminary     Lab Data:  CBC: Recent Labs  Lab 10/06/20 1014 10/07/20 0046  WBC 5.5 6.4  NEUTROABS 3.3  --   HGB 5.6* 7.4*  HCT 20.7* 26.1*  MCV 88.8 87.6  PLT 170 139*   Basic Metabolic Panel: Recent Labs  Lab 10/06/20 1014 10/06/20 1340 10/07/20 0046  NA 134*  --  132*  K 4.6  --  4.2  CL 103  --  104  CO2 22  --  22  GLUCOSE 200*  --  119*  BUN 7*  --  9  CREATININE 0.81  --  0.60*  CALCIUM 8.0*  --  7.9*  MG  --  2.2  --   PHOS  --  2.7  --    GFR: CrCl cannot be calculated (Unknown ideal weight.). Liver Function Tests: Recent Labs  Lab 10/06/20 1014  AST 64*  ALT 39  ALKPHOS 122  BILITOT 0.9  PROT 6.7  ALBUMIN 2.7*   No results for input(s): LIPASE, AMYLASE in the last 168 hours. No results for input(s): AMMONIA in the last 168 hours. Coagulation Profile: No results for input(s): INR, PROTIME in the last 168 hours. Cardiac Enzymes: No results for input(s): CKTOTAL, CKMB, CKMBINDEX, TROPONINI in the last 168 hours. BNP (last 3 results) No results for input(s): PROBNP in the last 8760 hours. HbA1C: Recent  Labs    10/06/20 1424  HGBA1C 5.2   CBG: No results for input(s): GLUCAP in the last 168 hours. Lipid Profile: Recent Labs    10/06/20 1337  CHOL 87  HDL 16*  LDLCALC 53  TRIG 91  CHOLHDL 5.4   Thyroid Function Tests: No results for input(s): TSH, T4TOTAL, FREET4, T3FREE, THYROIDAB in the last 72 hours. Anemia Panel: Recent Labs    10/06/20 1014 10/07/20 0046  VITAMINB12  --  1,243*  FOLATE  --  49.6  FERRITIN 20* 17*  TIBC 483* 449  IRON 12* 32*   Urine analysis:    Component Value Date/Time   COLORURINE YELLOW 11/28/2017 1924  APPEARANCEUR HAZY (A) 11/28/2017 1924   LABSPEC 1.018 11/28/2017 1924   PHURINE 6.0 11/28/2017 1924   GLUCOSEU 50 (A) 11/28/2017 1924   HGBUR LARGE (A) 11/28/2017 1924   BILIRUBINUR NEGATIVE 11/28/2017 1924   BILIRUBINUR n 11/01/2011 1511   KETONESUR NEGATIVE 11/28/2017 1924   PROTEINUR 30 (A) 11/28/2017 1924   UROBILINOGEN 2.0 11/01/2011 1511   NITRITE NEGATIVE 11/28/2017 1924   LEUKOCYTESUR NEGATIVE 11/28/2017 1924     Wanette Robison M.D. [facility] 10/07/2020, 11:48 AM  Available via Epic secure chat 7am-7pm After 7 pm, please refer to night coverage provider listed on amion.

## 2020-10-07 NOTE — Transfer of Care (Signed)
Immediate Anesthesia Transfer of Care Note  Patient: Nathan Roberts  Procedure(s) Performed: ESOPHAGOGASTRODUODENOSCOPY (EGD) WITH PROPOFOL (N/A ) ESOPHAGEAL BANDING  Patient Location: Endoscopy Unit  Anesthesia Type:MAC  Level of Consciousness: drowsy  Airway & Oxygen Therapy: Patient Spontanous Breathing and Patient connected to face mask oxygen  Post-op Assessment: Report given to RN and Post -op Vital signs reviewed and stable  Post vital signs: Reviewed and stable  Last Vitals:  Vitals Value Taken Time  BP    Temp    Pulse    Resp    SpO2      Last Pain:  Vitals:   10/07/20 1314  TempSrc: Oral  PainSc: 0-No pain         Complications: No complications documented.

## 2020-10-07 NOTE — Progress Notes (Signed)
Bilateral lower extremity venous duplex has been completed. Preliminary results can be found in CV Proc through chart review.   10/07/20 9:33 AM Olen Cordial RVT

## 2020-10-07 NOTE — Op Note (Signed)
[facility] Patient Name: Nathan Roberts Procedure Date: 10/07/2020 MRN: [id number] Attending MD: Napoleon Form , MD Date of Birth: 01-06-1961 CSN: [id number] Age: 60 Admit Type: Inpatient Procedure:                Upper GI endoscopy Indications:              Recent gastrointestinal bleeding, Suspected upper                            gastrointestinal bleeding Providers:                Napoleon Form, MD, Clearnce Sorrel, RN, Rosilyn Mings, Technician Referring MD:              Medicines:                Monitored Anesthesia Care Complications:            No immediate complications. Estimated Blood Loss:     Estimated blood loss was minimal. Procedure:                Pre-Anesthesia Assessment:                           - Prior to the procedure, a History and Physical                            was performed, and patient medications and                            allergies were reviewed. The patient's tolerance of                            previous anesthesia was also reviewed. The risks                            and benefits of the procedure and the sedation                            options and risks were discussed with the patient.                            All questions were answered, and informed consent                            was obtained. Prior Anticoagulants: The patient has                            taken no previous anticoagulant or antiplatelet                            agents. ASA Grade Assessment: II - A patient with  mild systemic disease. After reviewing the risks                            and benefits, the patient was deemed in                            satisfactory condition to undergo the procedure.                           After obtaining informed consent, the endoscope was                            passed under direct vision. Throughout the                             procedure, the patient's blood pressure, pulse, and                            oxygen saturations were monitored continuously. The                            [id number]-[id number] ([id number]) Olympus gastroscope was                            introduced through the mouth, and advanced to the                            second part of duodenum. The upper GI endoscopy was                            accomplished without difficulty. The patient                            tolerated the procedure well. Scope In: Scope Out: Findings:      Grade II varices were found in the lower third of the esophagus with red       wale sign and stigmata of recent bleeding. They were 10 mm in largest       diameter. Three bands were successfully placed with complete       eradication, resulting in deflation of varices. There was no bleeding       during and at the end of the procedure.      Mild portal hypertensive gastropathy was found in the gastric body.      The examined duodenum was normal. Impression:               - Grade II esophageal varices. Completely                            eradicated. Banded.                           - Portal hypertensive gastropathy.                           - Normal examined duodenum.                           -  No specimens collected. Moderate Sedation:      N/A Recommendation:           - Patient has a contact number available for                            emergencies. The signs and symptoms of potential                            delayed complications were discussed with the                            patient. Return to normal activities tomorrow.                            Written discharge instructions were provided to the                            patient.                           - Clear liquid diet today, then advance as                            tolerated to soft diet for 1 week.                           - Continue present medications.                           - Use  Protonix (pantoprazole) 40 mg PO BID.                           - Start Propranolol  BID and titrated by the                            heart rate to goal HR 50-60.                           - Abdominal ultrasound with dopplers                           - Check Hep C Ab and reflex PCR                           - Avoid ETOH, NSAID's or any hepatotoxins                           - Repeat EGD in 4-6 weeks for follow up and repeat                            banding if needed                           - Monitor Hgb and transfuse if <7                           -  GI office follow up visit in 2-4 weeks Procedure Code(s):        --- Professional ---                           [phone], Esophagogastroduodenoscopy, flexible,                            transoral; with band ligation of esophageal/gastric                            varices Diagnosis Code(s):        --- Professional ---                           I85.00, Esophageal varices without bleeding                           K76.6, Portal hypertension                           K31.89, Other diseases of stomach and duodenum                           K92.2, Gastrointestinal hemorrhage, unspecified CPT copyright 2019 [employer]. All rights reserved. The codes documented in this report are preliminary and upon coder review may  be revised to meet current compliance requirements. Napoleon Form, MD 10/07/2020 3:04:16 PM This report has been signed electronically. Number of Addenda: 0

## 2020-10-07 NOTE — Anesthesia Preprocedure Evaluation (Signed)
Anesthesia Evaluation  Patient identified by MRN, date of birth, ID band Patient awake    Reviewed: Allergy & Precautions, NPO status , Patient's Chart, lab work & pertinent test results  Airway Mallampati: II  TM Distance: >3 FB Neck ROM: Full    Dental no notable dental hx.    Pulmonary neg pulmonary ROS, former smoker,    Pulmonary exam normal breath sounds clear to auscultation       Cardiovascular hypertension, Normal cardiovascular exam Rhythm:Regular Rate:Normal     Neuro/Psych negative neurological ROS  negative psych ROS   GI/Hepatic GERD  ,(+)     substance abuse  alcohol use,   Endo/Other  negative endocrine ROS  Renal/GU negative Renal ROS  negative genitourinary   Musculoskeletal negative musculoskeletal ROS (+)   Abdominal   Peds negative pediatric ROS (+)  Hematology  (+) anemia ,   Anesthesia Other Findings   Reproductive/Obstetrics negative OB ROS                             Anesthesia Physical Anesthesia Plan  ASA: III  Anesthesia Plan: MAC   Post-op Pain Management:    Induction: Intravenous  PONV Risk Score and Plan: 1 and Propofol infusion  Airway Management Planned: Nasal Cannula  Additional Equipment:   Intra-op Plan:   Post-operative Plan:   Informed Consent: I have reviewed the patients History and Physical, chart, labs and discussed the procedure including the risks, benefits and alternatives for the proposed anesthesia with the patient or authorized representative who has indicated his/her understanding and acceptance.     Dental advisory given  Plan Discussed with: CRNA and Surgeon  Anesthesia Plan Comments:         Anesthesia Quick Evaluation

## 2020-10-08 ENCOUNTER — Encounter (HOSPITAL_COMMUNITY): Payer: Self-pay | Admitting: Gastroenterology

## 2020-10-08 ENCOUNTER — Observation Stay (HOSPITAL_COMMUNITY): Payer: BC Managed Care – PPO

## 2020-10-08 DIAGNOSIS — Z7289 Other problems related to lifestyle: Secondary | ICD-10-CM

## 2020-10-08 DIAGNOSIS — R600 Localized edema: Secondary | ICD-10-CM | POA: Diagnosis not present

## 2020-10-08 DIAGNOSIS — K3189 Other diseases of stomach and duodenum: Secondary | ICD-10-CM | POA: Diagnosis present

## 2020-10-08 DIAGNOSIS — I8510 Secondary esophageal varices without bleeding: Secondary | ICD-10-CM | POA: Diagnosis not present

## 2020-10-08 DIAGNOSIS — K7030 Alcoholic cirrhosis of liver without ascites: Secondary | ICD-10-CM | POA: Diagnosis not present

## 2020-10-08 DIAGNOSIS — K766 Portal hypertension: Secondary | ICD-10-CM | POA: Diagnosis present

## 2020-10-08 DIAGNOSIS — D649 Anemia, unspecified: Secondary | ICD-10-CM | POA: Diagnosis not present

## 2020-10-08 DIAGNOSIS — I8500 Esophageal varices without bleeding: Secondary | ICD-10-CM | POA: Diagnosis present

## 2020-10-08 LAB — CBC
HCT: 27.7 % — ABNORMAL LOW (ref 39.0–52.0)
Hemoglobin: 8.1 g/dL — ABNORMAL LOW (ref 13.0–17.0)
MCH: 25.4 pg — ABNORMAL LOW (ref 26.0–34.0)
MCHC: 29.2 g/dL — ABNORMAL LOW (ref 30.0–36.0)
MCV: 86.8 fL (ref 80.0–100.0)
Platelets: 121 10*3/uL — ABNORMAL LOW (ref 150–400)
RBC: 3.19 MIL/uL — ABNORMAL LOW (ref 4.22–5.81)
RDW: 18.3 % — ABNORMAL HIGH (ref 11.5–15.5)
WBC: 4.8 10*3/uL (ref 4.0–10.5)
nRBC: 0 % (ref 0.0–0.2)

## 2020-10-08 LAB — BPAM RBC
Blood Product Expiration Date: 202204102359
Blood Product Expiration Date: 202204132359
Blood Product Expiration Date: 202204142359
ISSUE DATE / TIME: 202203091721
ISSUE DATE / TIME: 202203092022
ISSUE DATE / TIME: 202203101749
Unit Type and Rh: 9500
Unit Type and Rh: 9500
Unit Type and Rh: 9500

## 2020-10-08 LAB — TYPE AND SCREEN
ABO/RH(D): O NEG
Antibody Screen: NEGATIVE
Unit division: 0
Unit division: 0
Unit division: 0

## 2020-10-08 LAB — BASIC METABOLIC PANEL
Anion gap: 7 (ref 5–15)
BUN: 8 mg/dL (ref 8–23)
CO2: 23 mmol/L (ref 22–32)
Calcium: 7.8 mg/dL — ABNORMAL LOW (ref 8.9–10.3)
Chloride: 102 mmol/L (ref 98–111)
Creatinine, Ser: 0.62 mg/dL (ref 0.61–1.24)
GFR, Estimated: >60 mL/min (ref >60)
Glucose, Bld: 140 mg/dL — ABNORMAL HIGH (ref 70–99)
Potassium: 3.8 mmol/L (ref 3.5–5.1)
Sodium: 132 mmol/L — ABNORMAL LOW (ref 135–145)

## 2020-10-08 MED ORDER — PANTOPRAZOLE SODIUM 40 MG PO TBEC
40.0000 mg | DELAYED_RELEASE_TABLET | Freq: Two times a day (BID) | ORAL | Status: DC
  Administered 2020-10-08: 40 mg via ORAL
  Filled 2020-10-08: qty 1

## 2020-10-08 MED ORDER — DICYCLOMINE HCL 10 MG/5ML PO SOLN: 10.0000 mg | Freq: Three times a day (TID) | ORAL | 12 refills | Status: DC

## 2020-10-08 MED ORDER — PROPRANOLOL HCL 10 MG PO TABS: 10.0000 mg | ORAL_TABLET | Freq: Two times a day (BID) | ORAL | 3 refills | Status: DC

## 2020-10-08 MED ORDER — FOLIC ACID 1 MG PO TABS: 1.0000 mg | ORAL_TABLET | Freq: Every day | ORAL | 3 refills | Status: DC

## 2020-10-08 MED ORDER — PANTOPRAZOLE SODIUM 40 MG PO TBEC: 40.0000 mg | DELAYED_RELEASE_TABLET | Freq: Two times a day (BID) | ORAL | 3 refills | Status: DC

## 2020-10-08 MED ORDER — THIAMINE HCL 100 MG PO TABS: 100.0000 mg | ORAL_TABLET | Freq: Every day | ORAL | 3 refills | Status: DC

## 2020-10-08 NOTE — Discharge Summary (Signed)
Physician Discharge Summary   Patient ID: Nathan Roberts MRN: [id number] DOB/AGE: 62-Sep-1962 62 y.o.  Admit date: 10/06/2020 Discharge date: 10/08/2020  Primary Care Physician:  Jarrett Soho, PA-C   Recommendations for Outpatient Follow-up:  1. Follow up with PCP in 1-2 weeks 2. Patient counseled on alcohol cessation 3. Started on Protonix 40 mg twice daily, propranolol 10 mg twice daily 4. Hold Norvasc, losartan 5. Plan for GI follow-up outpatient and consider diuretics spironolactone/Lasix  Home Health: At baseline Equipment/Devices:   Discharge Condition: stable  CODE STATUS: FULL Diet recommendation: Heart healthy diet, low-sodium   Discharge Diagnoses:    . Acute symptomatic anemia, acute blood loss anemia . Acute upper GI bleed . Portal hypertensive gastropathy (HCC) . Esophageal varices (HCC), grade 2 Hepatic cirrhosis per liver Doppler . Hypertension . Hyperlipidemia . GERD (gastroesophageal reflux disease) . Bilateral lower extremity edema, no DVT . History of alcohol use . Obesity   Consults: GI    Allergies:   Allergies  Allergen Reactions  . Cephalexin Rash  . Doxycycline Rash  . Latex Hives and Rash     DISCHARGE MEDICATIONS: Allergies as of 10/08/2020      Reactions   Cephalexin Rash   Doxycycline Rash   Latex Hives, Rash      Medication List    STOP taking these medications   amLODipine 10 MG tablet Commonly known as: NORVASC   losartan 100 MG tablet Commonly known as: COZAAR   meloxicam 15 MG tablet Commonly known as: MOBIC     TAKE these medications   dicyclomine 10 MG/5ML solution Commonly known as: BENTYL Take 5 mLs (10 mg total) by mouth 4 (four) times daily -  before meals and at bedtime. May use dicyclomine caps if solution is not available   fluticasone 50 MCG/ACT nasal spray Commonly known as: FLONASE Place into both nostrils daily.   folic acid 1 MG tablet Commonly known as: FOLVITE Take 1 tablet (1 mg  total) by mouth daily. Start taking on: October 09, 2020   metFORMIN 500 MG tablet Commonly known as: GLUCOPHAGE Take 500 mg by mouth 2 (two) times daily with a meal.   mirtazapine 15 MG tablet Commonly known as: REMERON Take 15 mg by mouth at bedtime.   multivitamin with minerals tablet Take 1 tablet by mouth daily.   pantoprazole 40 MG tablet Commonly known as: PROTONIX Take 1 tablet (40 mg total) by mouth 2 (two) times daily before a meal. What changed: when to take this   propranolol 10 MG tablet Commonly known as: INDERAL Take 1 tablet (10 mg total) by mouth 2 (two) times daily.   thiamine 100 MG tablet Take 1 tablet (100 mg total) by mouth daily. Start taking on: October 09, 2020        Brief H and P: For complete details please refer to admission H and P, but in brief *Patient is a 60 year old male with hypertension, hyperlipidemia, GERD presented to ED with generalized fatigue, exertional dyspnea, lightheadedness and dark tarry stools intermittently for last 2 weeks.  No chest pain, nausea or vomiting.  Patient reports that he takes ibuprofen daily, 5-6 beers daily as well, last drink yesterday.  Patient went to his PCP and was reported to have low hemoglobin, low sodium and recommended him to go to ED. Hemoglobin 5.6 at the time of admission, MCV 88.8  Patient was admitted for further work-up.  Hospital Course:  Acute symptomatic anemia, upper GI bleed in the setting of daily NSAID  use, alcohol use Portal hypertensive gastropathy, grade 2 esophageal varices New diagnosis, cirrhosis per liver Doppler -Hemoglobin 5.6 at the time of admission, transfused total of 3 units packed RBC during this hospitalization.  Anemia panel showed iron 12, saturation ratio 2, ferritin 20 Hemoglobin 8.1 at the time of discharge -GI was consulted patient underwent EGD which showed grade 2 esophageal varices, completely eradicated, banded.  Portal hypertensive gastropathy. -Started on PPI  40 mg twice daily, propranolol 10 mg twice daily, will titrated up by GI Will need repeat EGD in 4 to 6 weeks for follow-up.  Patient was strongly counseled to avoid alcohol, NSAIDs -Liver Doppler showed morphological changes of hepatic cirrhosis      Hypertension -BP currently stable, continue propranolol.  Prior history of hyperlipidemia -Not on any medication, LDL 53, cholesterol 87    Bilateral lower extremity edema -Bilateral lower extremity ultrasound showed no DVT -Amlodipine discontinued.   Patient will be seen in GI office, to consider spironolactone/Lasix  Alcohol use -Patient was placed on a CIWA with Ativan, did not had acute withdrawal symptoms  Currently safe to be discharged home.  Obesity Estimated body mass index is 30.28 kg/m as calculated from the following:   Height as of 09/21/20: 5\' 9"  (1.753 m).   Weight as of 09/21/20: 93 kg.   Day of Discharge S: No acute complaints, no nausea vomiting or abdominal pain.  Feels better, wants to go home.  BP (!) 141/78 (BP Location: Left Arm)   Pulse 78   Temp 98.7 F (37.1 C)   Resp 15   SpO2 96%   Physical Exam: General: Alert and awake oriented x3 not in any acute distress. HEENT: anicteric sclera, pupils reactive to light and accommodation CVS: S1-S2 clear no murmur rubs or gallops Chest: clear to auscultation bilaterally, no wheezing rales or rhonchi Abdomen: soft nontender, nondistended, normal bowel sounds Extremities: no cyanosis, clubbing or edema noted bilaterally Neuro: Cranial nerves II-XII intact, no focal neurological deficits    Get Medicines reviewed and adjusted: Please take all your medications with you for your next visit with your Primary MD  Please request your Primary MD to go over all hospital tests and procedure/radiological results at the follow up. Please ask your Primary MD to get all Hospital records sent to his/her office.  If you experience worsening of your admission  symptoms, develop shortness of breath, life threatening emergency, suicidal or homicidal thoughts you must seek medical attention immediately by calling 911 or calling your MD immediately  if symptoms less severe.  You must read complete instructions/literature along with all the possible adverse reactions/side effects for all the Medicines you take and that have been prescribed to you. Take any new Medicines after you have completely understood and accept all the possible adverse reactions/side effects.   Do not drive when taking pain medications.   Do not take more than prescribed Pain, Sleep and Anxiety Medications  Special Instructions: If you have smoked or chewed Tobacco  in the last 2 yrs please stop smoking, stop any regular Alcohol  and or any Recreational drug use.  Wear Seat belts while driving.  Please note  You were cared for by a hospitalist during your hospital stay. Once you are discharged, your primary care physician will handle any further medical issues. Please note that NO REFILLS for any discharge medications will be authorized once you are discharged, as it is imperative that you return to your primary care physician (or establish a relationship with a  primary care physician if you do not have one) for your aftercare needs so that they can reassess your need for medications and monitor your lab values.   The results of significant diagnostics from this hospitalization (including imaging, microbiology, ancillary and laboratory) are listed below for reference.      Procedures/Studies:  NCV with EMG(electromyography)  Result Date: 09/22/2020 Glendale Chard, DO     09/22/2020 11:58 AM [facility] [address] [place], [address]  [place], [place] [address] Tel: [phone] Fax:  [phone] Test Date:  09/22/2020 Patient: Day Deery DOB: 1961/05/07 Physician: Nita Sickle, DO Sex: Male Height: 5\' 9"  Ref Phys: , MD ID#: Cindee Salt   Technician:  Patient  Complaints: This is a 60 year old man with history of C4-6 ACDF and left CTS release referred for evaluation of right hand numbness and tingling. NCV & EMG Findings: Extensive electrodiagnostic testing of the right upper extremity shows: 1. Right median sensory response shows prolonged latency (3.9 ms) and reduced amplitude (7.8 V).  Left median, left mixed palmar, and bilateral ulnar sensory responses are within normal limits.  2. Bilateral median and ulnar motor responses are within normal limits.  Of note, there is evidence of bilateral Martin-Gruber anastomosis as seen by a motor response stimulating at the ulnar-wrist and recording at the abductor pollicis brevis muscle.  3. There is no evidence of active or chronic motor axonal loss changes affecting any of the tested muscles.  Motor unit configuration and recruitment pattern is within normal limits. Impression: 1. 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  2. There is no evidence of a cervical radiculopathy. 3. Incidentally, there is evidence of bilateral Martin-Gruber anastomoses, a normal anatomic variant. ___________________________ 77, DO Nerve Conduction Studies Anti Sensory Summary Table  Stim Site NR Peak (ms) Norm Peak (ms) P-T Amp (V) Norm P-T Amp Left Median Anti Sensory (2nd Digit)  34C Wrist    3.2 <3.8 12.1 >10 Right Median Anti Sensory (2nd Digit)  34C Wrist    3.9 <3.8 7.8 >10 Left Ulnar Anti Sensory (5th Digit)  34C Wrist    3.1 <3.2 16.8 >5 Right Ulnar Anti Sensory (5th Digit)  34C Wrist    3.1 <3.2 15.2 >5 Motor Summary Table  Stim Site NR Onset (ms) Norm Onset (ms) O-P Amp (mV) Norm O-P Amp Site1 Site2 Delta-0 (ms) Dist (cm) Vel (m/s) Norm Vel (m/s) Left Median Motor (Abd Poll Brev)  34C Wrist    3.9 <4.0 5.4 >5 Elbow Wrist 6.0 31.0 52 >50 Elbow    9.9  4.1  Ulnar-wrist crossover Elbow 6.1 0.0   Ulnar-wrist crossover    3.8  2.2        Right  Median Motor (Abd Poll Brev)  34C Wrist    3.6 <4.0 8.5 >5 Elbow Wrist 6.0 31.0 52 >50 Elbow    9.6  7.4  Ulnar-wrist crossover Elbow 5.5 0.0   Ulnar-wrist crossover    4.1  2.2        Left Ulnar Motor (Abd Dig Minimi)  34C Wrist    2.5 <3.1 9.0 >7 B Elbow Wrist 4.0 24.0 60 >50 B Elbow    6.5  7.9  A Elbow B Elbow 1.8 10.0 56 >50 A Elbow    8.3  7.4        Right Ulnar Motor (Abd Dig Minimi)  34C Wrist    2.4 <3.1 9.6 >7  B Elbow Wrist 4.1 23.0 56 >50 B Elbow    6.5  8.4  A Elbow B Elbow 1.7 10.0 59 >50 A Elbow    8.2  8.2        Comparison Summary Table  Stim Site NR Peak (ms) Norm Peak (ms) P-T Amp (V) Site1 Site2 Delta-P (ms) Norm Delta (ms) Left Median/Ulnar Palm Comparison (Wrist - 8cm)  34C Median Palm    2.0 <2.2 26.7 Median Palm Ulnar Palm 0.2  Ulnar Palm    1.8 <2.2 13.8     EMG  Side Muscle Ins Act Fibs Psw Fasc Number Recrt Dur Dur. Amp Amp. Poly Poly. Comment Right 1stDorInt Nml Nml Nml Nml Nml Nml Nml Nml Nml Nml Nml Nml N/A Right Abd Poll Brev Nml Nml Nml Nml Nml Nml Nml Nml Nml Nml Nml Nml N/A Right PronatorTeres Nml Nml Nml Nml Nml Nml Nml Nml Nml Nml Nml Nml N/A Right Biceps Nml Nml Nml Nml Nml Nml Nml Nml Nml Nml Nml Nml N/A Right Triceps Nml Nml Nml Nml Nml Nml Nml Nml Nml Nml Nml Nml N/A Right Deltoid Nml Nml Nml Nml Nml Nml Nml Nml Nml Nml Nml Nml N/A Left 1stDorInt Nml Nml Nml Nml Nml Nml Nml Nml Nml Nml Nml Nml N/A Left PronatorTeres Nml Nml Nml Nml Nml Nml Nml Nml Nml Nml Nml Nml N/A Left Biceps Nml Nml Nml Nml Nml Nml Nml Nml Nml Nml Nml Nml N/A Left Triceps Nml Nml Nml Nml Nml Nml Nml Nml Nml Nml Nml Nml N/A Left Deltoid Nml Nml Nml Nml Nml Nml Nml Nml Nml Nml Nml Nml N/A Waveforms:            US LIVER DOPPLER  Result Date: 10/08/2020 CLINICAL DATA:  60 year old male with history of cirrhosis. EXAM: DUPLEX ULTRASOUND OF LIVER TECHNIQUE: Color and duplex Doppler ultrasound was performed to evaluate the hepatic in-flow and out-flow vessels. COMPARISON:  None. FINDINGS: Liver:  Increased, coarsened echogenicity.  Nodular contour. No focal lesion, mass or intrahepatic biliary ductal dilatation. Main Portal Vein size: 1.13 cm Portal Vein Velocities Main Prox: Not well visualized Main Mid: 84 cm/sec, antegrade Main Dist: Not well visualized Right: 86 cm/sec, antegrade Left: 113 cm/sec, antegrade Hepatic Vein Velocities Right:  77 cm/sec Middle:  26 cm/sec Left:  56 cm/sec IVC: Present and patent with normal respiratory phasicity. Hepatic Artery Velocity:  118 cm/sec Splenic Vein Velocity: Not well visualized Spleen: Not visualized. Portal Vein Occlusion/Thrombus: No Splenic Vein Occlusion/Thrombus: No Ascites: None Varices: None IMPRESSION: Morphologic changes of hepatic cirrhosis without sonographic evidence of significant portal hypertension or hepatoma. Marliss Coots, MD [facility] Electronically Signed   By: Marliss Coots MD   On: 10/08/2020 08:45   VAS [place] LOWER EXTREMITY VENOUS (DVT)  Result Date: 10/07/2020  Lower Venous DVT Study Indications: Edema.  Risk Factors: None identified. Limitations: Poor ultrasound/tissue interface. Comparison Study: No prior studies. Performing Technologist: Chanda Busing RVT  Examination Guidelines: A complete evaluation includes B-mode imaging, spectral Doppler, color Doppler, and power Doppler as needed of all accessible portions of each vessel. Bilateral testing is considered an integral part of a complete examination. Limited examinations for reoccurring indications may be performed as noted. The reflux portion of the exam is performed with the patient in reverse Trendelenburg.  +---------+---------------+---------+-----------+----------+--------------+ RIGHT    CompressibilityPhasicitySpontaneityPropertiesThrombus Aging +---------+---------------+---------+-----------+----------+--------------+ CFV      Full           Yes  Yes                                  +---------+---------------+---------+-----------+----------+--------------+ SFJ      Full                                                        +---------+---------------+---------+-----------+----------+--------------+ FV Prox  Full                                                        +---------+---------------+---------+-----------+----------+--------------+ FV Mid   Full                                                        +---------+---------------+---------+-----------+----------+--------------+ FV DistalFull                                                        +---------+---------------+---------+-----------+----------+--------------+ PFV      Full                                                        +---------+---------------+---------+-----------+----------+--------------+ POP      Full           Yes      Yes                                 +---------+---------------+---------+-----------+----------+--------------+ PTV      Full                                                        +---------+---------------+---------+-----------+----------+--------------+ PERO     Full                                                        +---------+---------------+---------+-----------+----------+--------------+   +---------+---------------+---------+-----------+----------+--------------+ LEFT     CompressibilityPhasicitySpontaneityPropertiesThrombus Aging +---------+---------------+---------+-----------+----------+--------------+ CFV      Full           Yes      Yes                                 +---------+---------------+---------+-----------+----------+--------------+ SFJ      Full                                                        +---------+---------------+---------+-----------+----------+--------------+  FV Prox  Full                                                         +---------+---------------+---------+-----------+----------+--------------+ FV Mid   Full                                                        +---------+---------------+---------+-----------+----------+--------------+ FV DistalFull                                                        +---------+---------------+---------+-----------+----------+--------------+ PFV      Full                                                        +---------+---------------+---------+-----------+----------+--------------+ POP      Full           Yes      Yes                                 +---------+---------------+---------+-----------+----------+--------------+ PTV      Full                                                        +---------+---------------+---------+-----------+----------+--------------+ PERO     Full                                                        +---------+---------------+---------+-----------+----------+--------------+     Summary: RIGHT: - There is no evidence of deep vein thrombosis in the lower extremity.  - No cystic structure found in the popliteal fossa.  LEFT: - There is no evidence of deep vein thrombosis in the lower extremity.  - No cystic structure found in the popliteal fossa.  *See table(s) above for measurements and observations. Electronically signed by Heath Lark on 10/07/2020 at 1:16:44 PM.    Final        LAB RESULTS: Basic Metabolic Panel: Recent Labs  Lab 10/06/20 1340 10/07/20 0046 10/08/20 0345  NA  --  132* 132*  K  --  4.2 3.8  CL  --  104 102  CO2  --  22 23  GLUCOSE  --  119* 140*  BUN  --  9 8  CREATININE  --  0.60* 0.62  CALCIUM  --  7.9* 7.8*  MG 2.2  --   --   PHOS 2.7  --   --    Liver  Function Tests: Recent Labs  Lab 10/06/20 1014  AST 64*  ALT 39  ALKPHOS 122  BILITOT 0.9  PROT 6.7  ALBUMIN 2.7*   No results for input(s): LIPASE, AMYLASE in the last 168 hours. No results for input(s): AMMONIA  in the last 168 hours. CBC: Recent Labs  Lab 10/06/20 1014 10/07/20 0046 10/08/20 0345  WBC 5.5 6.4 4.8  NEUTROABS 3.3  --   --   HGB 5.6* 7.4* 8.1*  HCT 20.7* 26.1* 27.7*  MCV 88.8 87.6 86.8  PLT 170 139* 121*   Cardiac Enzymes: No results for input(s): CKTOTAL, CKMB, CKMBINDEX, TROPONINI in the last 168 hours. BNP: Invalid input(s): POCBNP CBG: No results for input(s): GLUCAP in the last 168 hours.     Disposition and Follow-up: Discharge Instructions    Diet - low sodium heart healthy   Complete by: As directed    Discharge instructions   Complete by: As directed    Low-sodium diet, 2g   Increase activity slowly   Complete by: As directed        DISPOSITION: Home   DISCHARGE FOLLOW-UP  Follow-up Information    Meredith PelGuenther, Paula M, NP Follow up on 10/19/2020.   Specialty: Gastroenterology Why: at 2pm. Please arrive 15 minutes early  Contact information: [address] [place] [address] [phone]        Jarrett SohoWharton, Courtney, PA-C. Schedule an appointment as soon as possible for a visit in 2 week(s).   Specialty: Family Medicine Contact information: [address] [place] [address] [phone]                Time coordinating discharge:  35 minutes  Signed:   Thad Rangeripudeep Torrion Witter M.D. [facility] 10/08/2020, 2:04 PM

## 2020-10-08 NOTE — Progress Notes (Signed)
Made patient a hospital follow up with me on 11/02/20 at 10am.

## 2020-10-08 NOTE — Progress Notes (Addendum)
Progress Note  Chief Complaint:    GI bleed     ASSESSMENT / PLAN:    # 60 yo male with newly diagnosed cirrhosis admitted with GI bleed and profound Liberty anemia . EGD yesterday showed grade II esophageal varices and portal HTN. The varices were banded and eradicated. Liver doppler shows cirrhosis. Suspect Etoh cause of cirrhosis.  --hgb improved to 8.1 post transfusion.  --stable for discharge from GI standpoint. He will go home on Inderal 10 mg BID. --Bilateral lower extremity US negative for DVT. Due to lower extremity edema the hospitalist is stopping Norvasc.  --2 gram sodium diet --Will make office follow up with me in a few weeks. If edema not improved when I see him in clinic then might start low dose diuretics. Also as outpatient will obtain hepatic serologic work-up to look for causes of cirrhosis other than just EtOH.  Will obtain ultrasound and AFP for HCC screening. --In a few weeks he will need repeat EGD to follow up on varices --Depending on tolerance will increase beta blocker dose --He needs to avoid NSAIDS  --Discussed need for Etoh abstinence.   --Follow up with me on 3/22/at 2pm      SUBJECTIVE:   Feels okay.     OBJECTIVE:    Scheduled inpatient medications:  . dicyclomine  10 mg Oral TID AC & HS  . folic acid  1 mg Oral Daily  . multivitamin with minerals  1 tablet Oral Daily  . pantoprazole  40 mg Oral BID AC  . propranolol  10 mg Oral BID  . thiamine  100 mg Oral Daily   Or  . thiamine  100 mg Intravenous Daily   Continuous inpatient infusions:  PRN inpatient medications: hydrALAZINE, LORazepam **OR** LORazepam, ondansetron **OR** ondansetron (ZOFRAN) IV  Vital signs in last 24 hours: Temp:  [97.6 F (36.4 C)-99.5 F (37.5 C)] 98.7 F (37.1 C) (03/11 0821) Pulse Rate:  [73-93] 78 (03/11 0821) Resp:  [13-21] 15 (03/11 0821) BP: (119-158)/(54-93) 141/78 (03/11 0821) SpO2:  [95 %-100 %] 96 % (03/11 0821) Last BM Date:  10/07/20  Intake/Output Summary (Last 24 hours) at 10/08/2020 0941 Last data filed at 10/07/2020 2041 Gross per 24 hour  Intake 576 ml  Output -  Net 576 ml     Physical Exam:  . General: Alert male in NAD . Heart:  Regular rate and rhythm, 2+ BLE edema.  . Pulmonary: Normal respiratory effort . Abdomen: Soft, mildly distended, nontender. Normal bowel sounds.  . Neurologic: Alert and oriented . Psych: Pleasant. Cooperative.    Intake/Output from previous day: 03/10 0701 - 03/11 0700 In: 576 [P.O.:260; Blood:316] Out: -  Intake/Output this shift: No intake/output data recorded.    Lab Results: Recent Labs    10/06/20 1014 10/07/20 0046 10/08/20 0345  WBC 5.5 6.4 4.8  HGB 5.6* 7.4* 8.1*  HCT 20.7* 26.1* 27.7*  PLT 170 139* 121*   BMET Recent Labs    10/06/20 1014 10/07/20 0046 10/08/20 0345  NA 134* 132* 132*  K 4.6 4.2 3.8  CL 103 104 102  CO2 22 22 23   GLUCOSE 200* 119* 140*  BUN 7* 9 8  CREATININE 0.81 0.60* 0.62  CALCIUM 8.0* 7.9* 7.8*   LFT Recent Labs    10/06/20 1014  PROT 6.7  ALBUMIN 2.7*  AST 64*  ALT 39  ALKPHOS 122  BILITOT 0.9   PT/INR No results for input(s): LABPROT, INR in the last 72  hours. Hepatitis Panel No results for input(s): HEPBSAG, HCVAB, HEPAIGM, HEPBIGM in the last 72 hours.  US LIVER DOPPLER  Result Date: 10/08/2020 CLINICAL DATA:  60 year old male with history of cirrhosis. EXAM: DUPLEX ULTRASOUND OF LIVER TECHNIQUE: Color and duplex Doppler ultrasound was performed to evaluate the hepatic in-flow and out-flow vessels. COMPARISON:  None. FINDINGS: Liver: Increased, coarsened echogenicity.  Nodular contour. No focal lesion, mass or intrahepatic biliary ductal dilatation. Main Portal Vein size: 1.13 cm Portal Vein Velocities Main Prox: Not well visualized Main Mid: 84 cm/sec, antegrade Main Dist: Not well visualized Right: 86 cm/sec, antegrade Left: 113 cm/sec, antegrade Hepatic Vein Velocities Right:  77 cm/sec Middle:   26 cm/sec Left:  56 cm/sec IVC: Present and patent with normal respiratory phasicity. Hepatic Artery Velocity:  118 cm/sec Splenic Vein Velocity: Not well visualized Spleen: Not visualized. Portal Vein Occlusion/Thrombus: No Splenic Vein Occlusion/Thrombus: No Ascites: None Varices: None IMPRESSION: Morphologic changes of hepatic cirrhosis without sonographic evidence of significant portal hypertension or hepatoma. Marliss Coots, MD [facility] Radiology Electronically Signed   By: Marliss Coots MD   On: 10/08/2020 08:45   VAS [place] LOWER EXTREMITY VENOUS (DVT)  Result Date: 10/07/2020  Lower Venous DVT Study Indications: Edema.  Risk Factors: None identified. Limitations: Poor ultrasound/tissue interface. Comparison Study: No prior studies. Performing Technologist: Chanda Busing RVT  Examination Guidelines: A complete evaluation includes B-mode imaging, spectral Doppler, color Doppler, and power Doppler as needed of all accessible portions of each vessel. Bilateral testing is considered an integral part of a complete examination. Limited examinations for reoccurring indications may be performed as noted. The reflux portion of the exam is performed with the patient in reverse Trendelenburg.  +---------+---------------+---------+-----------+----------+--------------+ RIGHT    CompressibilityPhasicitySpontaneityPropertiesThrombus Aging +---------+---------------+---------+-----------+----------+--------------+ CFV      Full           Yes      Yes                                 +---------+---------------+---------+-----------+----------+--------------+ SFJ      Full                                                        +---------+---------------+---------+-----------+----------+--------------+ FV Prox  Full                                                        +---------+---------------+---------+-----------+----------+--------------+ FV  Mid   Full                                                        +---------+---------------+---------+-----------+----------+--------------+ FV DistalFull                                                        +---------+---------------+---------+-----------+----------+--------------+  PFV      Full                                                        +---------+---------------+---------+-----------+----------+--------------+ POP      Full           Yes      Yes                                 +---------+---------------+---------+-----------+----------+--------------+ PTV      Full                                                        +---------+---------------+---------+-----------+----------+--------------+ PERO     Full                                                        +---------+---------------+---------+-----------+----------+--------------+   +---------+---------------+---------+-----------+----------+--------------+ LEFT     CompressibilityPhasicitySpontaneityPropertiesThrombus Aging +---------+---------------+---------+-----------+----------+--------------+ CFV      Full           Yes      Yes                                 +---------+---------------+---------+-----------+----------+--------------+ SFJ      Full                                                        +---------+---------------+---------+-----------+----------+--------------+ FV Prox  Full                                                        +---------+---------------+---------+-----------+----------+--------------+ FV Mid   Full                                                        +---------+---------------+---------+-----------+----------+--------------+ FV DistalFull                                                        +---------+---------------+---------+-----------+----------+--------------+ PFV      Full                                                         +---------+---------------+---------+-----------+----------+--------------+  POP      Full           Yes      Yes                                 +---------+---------------+---------+-----------+----------+--------------+ PTV      Full                                                        +---------+---------------+---------+-----------+----------+--------------+ PERO     Full                                                        +---------+---------------+---------+-----------+----------+--------------+     Summary: RIGHT: - There is no evidence of deep vein thrombosis in the lower extremity.  - No cystic structure found in the popliteal fossa.  LEFT: - There is no evidence of deep vein thrombosis in the lower extremity.  - No cystic structure found in the popliteal fossa.  *See table(s) above for measurements and observations. Electronically signed by Heath Lark on 10/07/2020 at 1:16:44 PM.    Final     PREVIOUS ENDOSCOPIES:     Principal Problem:   Symptomatic anemia Active Problems:   Hypertension   Hyperlipidemia   GERD (gastroesophageal reflux disease)   Bilateral lower extremity edema   Upper GI bleed   Alcohol use   Portal hypertensive gastropathy (HCC)   Esophageal varices (HCC)     LOS: 0 days   Willette Cluster ,NP 10/08/2020, 9:41 AM     Attending physician's note   I have taken an interval history, reviewed the chart and examined the patient. I agree with the Advanced Practitioner's note, impression and recommendations.    We will arrange for GI office follow-up and repeat EGD in 6 to 8 weeks     K. Scherry Ran , MD [phone]

## 2020-10-08 NOTE — Plan of Care (Signed)
  Problem: Education: Goal: Knowledge of General Education information will improve Description: Including pain rating scale, medication(s)/side effects and non-pharmacologic comfort measures 10/08/2020 1345 by Darreld Mclean, RN Outcome: Adequate for Discharge 10/08/2020 1159 by Darreld Mclean, RN Outcome: Adequate for Discharge   Problem: Health Behavior/Discharge Planning: Goal: Ability to manage health-related needs will improve 10/08/2020 1345 by Darreld Mclean, RN Outcome: Adequate for Discharge 10/08/2020 1159 by Darreld Mclean, RN Outcome: Adequate for Discharge   Problem: Clinical Measurements: Goal: Ability to maintain clinical measurements within normal limits will improve 10/08/2020 1345 by Darreld Mclean, RN Outcome: Adequate for Discharge 10/08/2020 1159 by Darreld Mclean, RN Outcome: Adequate for Discharge Goal: Will remain free from infection 10/08/2020 1345 by Darreld Mclean, RN Outcome: Adequate for Discharge 10/08/2020 1159 by Darreld Mclean, RN Outcome: Adequate for Discharge Goal: Diagnostic test results will improve 10/08/2020 1345 by Darreld Mclean, RN Outcome: Adequate for Discharge 10/08/2020 1159 by Darreld Mclean, RN Outcome: Adequate for Discharge Goal: Respiratory complications will improve 10/08/2020 1345 by Darreld Mclean, RN Outcome: Adequate for Discharge 10/08/2020 1159 by Darreld Mclean, RN Outcome: Adequate for Discharge Goal: Cardiovascular complication will be avoided 10/08/2020 1345 by Darreld Mclean, RN Outcome: Adequate for Discharge 10/08/2020 1159 by Darreld Mclean, RN Outcome: Adequate for Discharge   Problem: Activity: Goal: Risk for activity intolerance will decrease 10/08/2020 1345 by Darreld Mclean, RN Outcome: Adequate for Discharge 10/08/2020 1159 by Darreld Mclean, RN Outcome: Adequate for Discharge   Problem: Nutrition: Goal: Adequate nutrition will be maintained 10/08/2020 1345 by Darreld Mclean, RN Outcome: Adequate for Discharge 10/08/2020 1159 by Darreld Mclean, RN Outcome: Adequate for Discharge   Problem:  Coping: Goal: Level of anxiety will decrease 10/08/2020 1345 by Darreld Mclean, RN Outcome: Adequate for Discharge 10/08/2020 1159 by Darreld Mclean, RN Outcome: Adequate for Discharge   Problem: Elimination: Goal: Will not experience complications related to bowel motility 10/08/2020 1345 by Darreld Mclean, RN Outcome: Adequate for Discharge 10/08/2020 1159 by Darreld Mclean, RN Outcome: Adequate for Discharge Goal: Will not experience complications related to urinary retention 10/08/2020 1345 by Darreld Mclean, RN Outcome: Adequate for Discharge 10/08/2020 1159 by Darreld Mclean, RN Outcome: Adequate for Discharge   Problem: Pain Managment: Goal: General experience of comfort will improve 10/08/2020 1345 by Darreld Mclean, RN Outcome: Adequate for Discharge 10/08/2020 1159 by Darreld Mclean, RN Outcome: Adequate for Discharge   Problem: Safety: Goal: Ability to remain free from injury will improve 10/08/2020 1345 by Darreld Mclean, RN Outcome: Adequate for Discharge 10/08/2020 1159 by Darreld Mclean, RN Outcome: Adequate for Discharge   Problem: Skin Integrity: Goal: Risk for impaired skin integrity will decrease 10/08/2020 1345 by Darreld Mclean, RN Outcome: Adequate for Discharge 10/08/2020 1159 by Darreld Mclean, RN Outcome: Adequate for Discharge

## 2020-10-08 NOTE — Progress Notes (Signed)
PT Cancellation Note  Patient Details Name: Nathan Roberts MRN: [id number] DOB: 1961/01/05   Cancelled Treatment:    Reason Eval/Treat Not Completed: PT screened, no needs identified, will sign off, pt. Demonstrated  Balance WFL, reports safe technique to negotiate his steps. No further PT needs.   Rada Hay 10/08/2020, 1:51 PM  Blanchard Kelch PT Acute [facility] Services Pager [phone] Office [phone]

## 2020-10-08 NOTE — Progress Notes (Signed)
Discharge instructions given with stated understanding 

## 2020-10-08 NOTE — Plan of Care (Signed)
  Problem: Education: Goal: Knowledge of General Education information will improve Description: Including pain rating scale, medication(s)/side effects and non-pharmacologic comfort measures Outcome: Adequate for Discharge   Problem: Health Behavior/Discharge Planning: Goal: Ability to manage health-related needs will improve Outcome: Adequate for Discharge   Problem: Clinical Measurements: Goal: Ability to maintain clinical measurements within normal limits will improve Outcome: Adequate for Discharge Goal: Will remain free from infection Outcome: Adequate for Discharge Goal: Diagnostic test results will improve Outcome: Adequate for Discharge Goal: Respiratory complications will improve Outcome: Adequate for Discharge Goal: Cardiovascular complication will be avoided Outcome: Adequate for Discharge   Problem: Activity: Goal: Risk for activity intolerance will decrease Outcome: Adequate for Discharge   Problem: Nutrition: Goal: Adequate nutrition will be maintained Outcome: Adequate for Discharge   Problem: Elimination: Goal: Will not experience complications related to bowel motility Outcome: Adequate for Discharge Goal: Will not experience complications related to urinary retention Outcome: Adequate for Discharge   Problem: Pain Managment: Goal: General experience of comfort will improve Outcome: Adequate for Discharge   Problem: Safety: Goal: Ability to remain free from injury will improve Outcome: Adequate for Discharge   Problem: Skin Integrity: Goal: Risk for impaired skin integrity will decrease Outcome: Adequate for Discharge   

## 2020-10-09 LAB — HCV AB W REFLEX TO QUANT PCR: HCV Ab: 0.1 s/co ratio (ref 0.0–0.9)

## 2020-10-09 LAB — HCV INTERPRETATION

## 2020-10-12 ENCOUNTER — Encounter (HOSPITAL_COMMUNITY): Payer: Self-pay | Admitting: Emergency Medicine

## 2020-10-12 ENCOUNTER — Ambulatory Visit (HOSPITAL_COMMUNITY)
Admission: EM | Admit: 2020-10-12 | Discharge: 2020-10-12 | Disposition: A | Discharge status: Home / Self Care | Payer: BC Managed Care – PPO | Attending: Emergency Medicine | Admitting: Emergency Medicine

## 2020-10-12 ENCOUNTER — Other Ambulatory Visit: Payer: Self-pay

## 2020-10-12 DIAGNOSIS — R21 Rash and other nonspecific skin eruption: Secondary | ICD-10-CM | POA: Diagnosis not present

## 2020-10-12 DIAGNOSIS — L309 Dermatitis, unspecified: Secondary | ICD-10-CM

## 2020-10-12 DIAGNOSIS — L509 Urticaria, unspecified: Secondary | ICD-10-CM

## 2020-10-12 MED ORDER — DEXAMETHASONE SODIUM PHOSPHATE 10 MG/ML IJ SOLN
INTRAMUSCULAR | Status: AC
  Filled 2020-10-12: qty 1

## 2020-10-12 MED ORDER — PREDNISONE 10 MG (21) PO TBPK: ORAL_TABLET | Freq: Every day | ORAL | 0 refills | Status: DC

## 2020-10-12 MED ORDER — DEXAMETHASONE SODIUM PHOSPHATE 10 MG/ML IJ SOLN
10.0000 mg | Freq: Once | INTRAMUSCULAR | Status: AC
  Administered 2020-10-12: 10 mg via INTRAMUSCULAR

## 2020-10-12 MED ORDER — HYDROXYZINE HCL 25 MG PO TABS: 25.0000 mg | ORAL_TABLET | Freq: Four times a day (QID) | ORAL | 0 refills | Status: DC | PRN

## 2020-10-12 NOTE — Discharge Instructions (Addendum)
Take the prednisone taper as instructed.  You can take the Atarax as needed for itching.  Do not take before driving or with alcohol as it can make you sleepy.  You can take oatmeal bath to help.   Avoid hot showers and bath.   You can take Zyrtec daily.   Follow up with dermatology or allergist as needed.    Return or go to the Emergency Department if symptoms worsen or do not improve in the next few days.

## 2020-10-12 NOTE — ED Provider Notes (Signed)
[facility]   [id number] 10/12/20 Arrival Time: 1249  CC: RASH  SUBJECTIVE:  Nathan Roberts is a 60 y.o. male who presents with a skin complaint that began about 1 month ago.  Denies precipitating event or trauma.  Denies changes in soaps, detergents, close contacts with similar rash, known trigger or environmental trigger, allergy. Denies medications change or starting a new medication recently.  Initially, localize rash to upper thighs but it has spread to trunk and upper arms.  Describes it as iching.  Has tried benadryl without relief.  There are no aggravating or alleviating factors. Denies similar symptoms in the past.   Denies fever, chills, nausea, vomiting, erythema, swelling, discharge, oral lesions, SOB, chest pain, abdominal pain, changes in bowel or bladder function.    ROS: As per HPI.  All other pertinent ROS negative.     Past Medical History:  Diagnosis Date  . Allergy   . Arthritis   . Carpal tunnel syndrome   . GERD (gastroesophageal reflux disease)   . Hyperlipidemia   . Hypertension   . Pre-diabetes    Past Surgical History:  Procedure Laterality Date  . abscess     rectal area  . CARPAL TUNNEL RELEASE     left  . CERVICAL FUSION    . ESOPHAGEAL BANDING  10/07/2020   Procedure: ESOPHAGEAL BANDING;  Surgeon: Napoleon Form, MD;  Location: [facility];  Service: Endoscopy;;  . ESOPHAGOGASTRODUODENOSCOPY (EGD) WITH PROPOFOL N/A 10/07/2020   Procedure: ESOPHAGOGASTRODUODENOSCOPY (EGD) WITH PROPOFOL;  Surgeon: Napoleon Form, MD;  Location: [facility] ENDOSCOPY;  Service: Endoscopy;  Laterality: N/A;  . SPINE SURGERY     Allergies  Allergen Reactions  . Cephalexin Rash  . Doxycycline Rash  . Latex Hives and Rash   No current facility-administered medications on file prior to encounter.   Current Outpatient Medications on File Prior to Encounter  Medication Sig Dispense Refill  . dicyclomine (BENTYL) 10 MG/5ML solution Take 5 mLs (10 mg total)  by mouth 4 (four) times daily -  before meals and at bedtime. May use dicyclomine caps if solution is not available 240 mL 12  . fluticasone (FLONASE) 50 MCG/ACT nasal spray Place into both nostrils daily.    . folic acid (FOLVITE) 1 MG tablet Take 1 tablet (1 mg total) by mouth daily. 30 tablet 3  . metFORMIN (GLUCOPHAGE) 500 MG tablet Take 500 mg by mouth 2 (two) times daily with a meal.    . mirtazapine (REMERON) 15 MG tablet Take 15 mg by mouth at bedtime.    . Multiple Vitamins-Minerals (MULTIVITAMIN WITH MINERALS) tablet Take 1 tablet by mouth daily.    . pantoprazole (PROTONIX) 40 MG tablet Take 1 tablet (40 mg total) by mouth 2 (two) times daily before a meal. 60 tablet 3  . propranolol (INDERAL) 10 MG tablet Take 1 tablet (10 mg total) by mouth 2 (two) times daily. 60 tablet 3  . thiamine 100 MG tablet Take 1 tablet (100 mg total) by mouth daily. 30 tablet 3   Social History   Socioeconomic History  . Marital status: Married    Spouse name: Not on file  . Number of children: Not on file  . Years of education: Not on file  . Highest education level: Not on file  Occupational History  . Not on file  Tobacco Use  . Smoking status: Former Smoker    Types: Cigarettes    Quit date: 12/08/1996    Years since quitting: 23.8  .  Smokeless tobacco: Never Used  Vaping Use  . Vaping Use: Never used  Substance and Sexual Activity  . Alcohol use: Yes    Alcohol/week: 9.0 standard drinks    Types: 2 Glasses of wine, 5 Cans of beer, 1 Shots of liquor, 1 Standard drinks or equivalent per week  . Drug use: No  . Sexual activity: Not Currently  Other Topics Concern  . Not on file  Social History Narrative  . Not on file   Social Determinants of Health   Financial Resource Strain: Not on file  Food Insecurity: Not on file  Transportation Needs: Not on file  Physical Activity: Not on file  Stress: Not on file  Social Connections: Not on file  Intimate Partner Violence: Not on file    Family History  Problem Relation Age of Onset  . Cancer Mother 76       lung--smoker  . Early death Mother   . Atrial fibrillation Father   . Heart disease Father   . COPD Father   . Hyperlipidemia Father   . Hypertension Father   . Colon cancer Neg Hx   . Esophageal cancer Neg Hx   . Stomach cancer Neg Hx   . Rectal cancer Neg Hx     OBJECTIVE: Vitals:   10/12/20 1417  BP: 129/60  Pulse: 63  Resp: 17  Temp: (!) 97.4 F (36.3 C)  TempSrc: Oral  SpO2: 100%    Physical Exam Vitals and nursing note reviewed.  Constitutional:      General: He is not in acute distress.    Appearance: Normal appearance. He is not ill-appearing, toxic-appearing or diaphoretic.  HENT:     Head: Normocephalic and atraumatic.  Eyes:     Conjunctiva/sclera: Conjunctivae normal.  Cardiovascular:     Rate and Rhythm: Normal rate.     Pulses: Normal pulses.  Pulmonary:     Effort: Pulmonary effort is normal.  Skin:    Findings: Rash present. Rash is urticarial (diffuse spread across trunk, abdomen, upper extremities and upper thighs).  Neurological:     General: No focal deficit present.     Mental Status: He is alert and oriented to person, place, and time.  Psychiatric:        Mood and Affect: Mood normal.     ASSESSMENT & PLAN:  1. Rash and nonspecific skin eruption   2. Dermatitis   3. Urticarial rash     Meds ordered this encounter  Medications  . dexamethasone (DECADRON) injection 10 mg  . predniSONE (STERAPRED UNI-PAK 21 TAB) 10 MG (21) TBPK tablet    Sig: Take by mouth daily. Take 6 tabs by mouth daily  for 2 days, then 5 tabs for 2 days, then 4 tabs for 2 days, then 3 tabs for 2 days, 2 tabs for 2 days, then 1 tab by mouth daily for 2 days    Dispense:  42 tablet    Refill:  0    Order Specific Question:   Supervising Provider    Answer:   Merrilee Jansky [id number]  . hydrOXYzine (ATARAX/VISTARIL) 25 MG tablet    Sig: Take 1 tablet (25 mg total) by mouth every 6  (six) hours as needed for itching.    Dispense:  12 tablet    Refill:  0    Order Specific Question:   Supervising Provider    Answer:   Merrilee Jansky [id number]   Decadron IM administered in office Steroid taper prescribed.  Hydroxyzine or benadryl for nighttime relief  Avoid hot showers/ baths Moisturize skin daily  Follow up with dermatology if symptoms persists Return or go to the ER if you have any new or worsening symptoms such as fever, chills, nausea, vomiting, redness, swelling, discharge, if symptoms do not improve with medications  Reviewed expectations re: course of current medical issues. Questions answered. Outlined signs and symptoms indicating need for more acute intervention. Patient verbalized understanding. After Visit Summary given.   Ivette Loyal, NP 10/12/20 1442

## 2020-10-12 NOTE — ED Triage Notes (Signed)
Pt presents with rash that started on legs and has now radiated to arms and stomach xs 3-4 days. States unable to sleep at night due to itching.

## 2020-10-18 ENCOUNTER — Encounter: Payer: Self-pay | Admitting: Nurse Practitioner

## 2020-10-18 DIAGNOSIS — K920 Hematemesis: Secondary | ICD-10-CM | POA: Diagnosis not present

## 2020-10-18 DIAGNOSIS — I8511 Secondary esophageal varices with bleeding: Secondary | ICD-10-CM | POA: Diagnosis not present

## 2020-10-18 DIAGNOSIS — I10 Essential (primary) hypertension: Secondary | ICD-10-CM | POA: Diagnosis not present

## 2020-10-18 DIAGNOSIS — K7030 Alcoholic cirrhosis of liver without ascites: Secondary | ICD-10-CM | POA: Diagnosis not present

## 2020-10-19 ENCOUNTER — Ambulatory Visit: Payer: BC Managed Care – PPO | Admitting: Nurse Practitioner

## 2020-10-19 ENCOUNTER — Encounter: Payer: BC Managed Care – PPO | Admitting: Neurology

## 2020-10-21 ENCOUNTER — Encounter (HOSPITAL_BASED_OUTPATIENT_CLINIC_OR_DEPARTMENT_OTHER): Admission: RE | Payer: Self-pay | Source: Home / Self Care

## 2020-10-21 ENCOUNTER — Ambulatory Visit (HOSPITAL_BASED_OUTPATIENT_CLINIC_OR_DEPARTMENT_OTHER)
Admission: RE | Admit: 2020-10-21 | Payer: BC Managed Care – PPO | Source: Home / Self Care | Admitting: Orthopedic Surgery

## 2020-10-21 SURGERY — FASCIECTOMY, PALM
Anesthesia: Regional | Laterality: Right

## 2020-11-08 ENCOUNTER — Other Ambulatory Visit: Payer: Self-pay

## 2020-11-08 ENCOUNTER — Other Ambulatory Visit (INDEPENDENT_AMBULATORY_CARE_PROVIDER_SITE_OTHER): Payer: BC Managed Care – PPO

## 2020-11-08 ENCOUNTER — Ambulatory Visit (INDEPENDENT_AMBULATORY_CARE_PROVIDER_SITE_OTHER): Payer: BC Managed Care – PPO | Admitting: Nurse Practitioner

## 2020-11-08 ENCOUNTER — Encounter: Payer: Self-pay | Admitting: Nurse Practitioner

## 2020-11-08 VITALS — BP 126/68 | HR 72 | Ht 67.5 in | Wt 201.0 lb

## 2020-11-08 DIAGNOSIS — K7460 Unspecified cirrhosis of liver: Secondary | ICD-10-CM | POA: Diagnosis not present

## 2020-11-08 LAB — CBC
HCT: 33.2 % — ABNORMAL LOW (ref 39.0–52.0)
Hemoglobin: 10.4 g/dL — ABNORMAL LOW (ref 13.0–17.0)
MCHC: 31.3 g/dL (ref 30.0–36.0)
MCV: 80.8 fl (ref 78.0–100.0)
Platelets: 141 10*3/uL — ABNORMAL LOW (ref 150.0–400.0)
RBC: 4.11 Mil/uL — ABNORMAL LOW (ref 4.22–5.81)
RDW: 20.6 % — ABNORMAL HIGH (ref 11.5–15.5)
WBC: 2.8 10*3/uL — ABNORMAL LOW (ref 4.0–10.5)

## 2020-11-08 MED ORDER — PROPRANOLOL HCL 10 MG PO TABS: ORAL_TABLET | ORAL | 5 refills | Status: DC

## 2020-11-08 NOTE — Patient Instructions (Signed)
If you are age 62 or older, your body mass index should be between 23-30. Your Body mass index is 31.02 kg/m. If this is out of the aforementioned range listed, please consider follow up with your Primary Care Provider.  If you are age 58 or younger, your body mass index should be between 19-25. Your Body mass index is 31.02 kg/m. If this is out of the aformentioned range listed, please consider follow up with your Primary Care Provider.   You have been scheduled for an endoscopy. Please follow written instructions given to you at your visit today. If you use inhalers (even only as needed), please bring them with you on the day of your procedure.  Your provider has requested that you go to the basement level for lab work before leaving today. Press "B" on the elevator. The lab is located at the first door on the left as you exit the elevator.  Increase your Propanol to 2 tablets in the morning and 1 tablet in the evening. A new script has been sent to your pharmacy.  You will needed to call the office in a few months to schedule a follow up appointment with Dr. Lavon Paganini, or call if you needed to be seen sooner.  Thank you for entrusting me with your care and choosing [facility].  Willette Cluster, NP-C

## 2020-11-08 NOTE — Progress Notes (Signed)
ASSESSMENT AND PLAN    # 60 yo male hospitalized 10/07/20 with newly diagnosed decompensated cirrhosis presenting with severe anemia / GI bleeding. Grade II esophageal varices with stigmata of recent bleeding found on EGD, s/p banding with eradication.   --Cirrhosis likely secondary to alcohol but will obtain complete serologic for evaluation of other etiologies of liver disease.   --Obtain INR --We discussed role of Etoh in cirrhosis.  He understands that total Etoh avoidance is paramount.  He cannot remember findings of last EGD so we reviewed those together.  --HCC surveillance: Obtain AFP.  Ultrasound liver Doppler March 2022 negative for focal liver lesions.  Will need cross-sectional imaging at some point --He seems to be tolerating propranolol 10 mg twice daily, BP 122/68, HR 72.   Will increase dose to 20 mg in am and 10 mg in pm. He will check radial pulse daily and let us know if heart rate falls below 60 or if he gets lightheaded.  --Follow up EGD to be done at hospital. The risks and benefits of EGD were discussed and the patient agrees to proceed. Next available hospital time is 01/18/21.    # Acute blood loss anemia --Received 3 units of blood in hospital for hgb of 5.6. Hgb 8.1 at time of hospital discharge.  --follow up CBC today.   ? Hx of IBS  --Sounds like Bentyl was started during recent admission. No further abdominal cramps, he will stop Bentyl.   # Colon cancer screening. Due for 10 year screening colonoscopy June 2023 --Last colonoscopy in 2013 was normal except for diverticulosis  # History of prediabetes.  PCP recently stopped Metformin.  A1c 5.2 on 10/07/2018   HISTORY OF PRESENT ILLNESS    Chief Complaint : Hospital follow-up  Nathan Roberts is a 60 y.o. male known to Dr.Nandigam with a past medical history of  HTN, hyperlipidemia, prediabetes , GERD, alcoholism, cirrhosis, diverticulosis  Patient was previously followed by Dr. Jarold Motto (2013).  We saw  him for inpatient consultation on 10/06/2020 after he presented with an UGI bleed and profound anemia.  He had been taking NSAIDs and also had a history of alcohol abuse.  Inpatient EGD remarkable for portal hypertension and grade 2 esophageal varices s/p banding and eradication.  He was started on propranolol 10 mg BID.  Liver doppler showed cirrhosis.  Patient was transfused 3 units of blood with improvement in hemoglobin to 8.1.    INTERVAL HISTORY: Nathan Roberts is here for hospital follow-up.  He has not had any further GI bleeding andhas been abstinent from alcohol since hospital discharge.  He is compliant with pantoprazole and Inderal.  He has questions about the inpatient EGD as far as what was found, what was done .  He has questions about the blood transfusions that he received and their potential to transmit disease.  Other than gas/bloating Nathan Roberts feels okay   PREVIOUS ENDOSCOPIC EVALUATIONS / PERTINENT STUDIES:   10/07/20 EGD Grade II esophageal varices. Completely eradicated. Banded. - Portal hypertensive gastropathy. - Normal examined duodenum. - No specimens collected.   Past Medical History:  Diagnosis Date  . Allergy   . Arthritis   . Carpal tunnel syndrome   . Esophageal varices (HCC)   . GERD (gastroesophageal reflux disease)   . Hyperlipidemia   . Hypertension   . Portal hypertensive gastropathy (HCC)   . Pre-diabetes     Current Medications, Allergies, Past Surgical History, Family History and Social History were reviewed in [employer]  electronic medical record.   Current Outpatient Medications  Medication Sig Dispense Refill  . dicyclomine (BENTYL) 10 MG/5ML solution Take 5 mLs (10 mg total) by mouth 4 (four) times daily -  before meals and at bedtime. May use dicyclomine caps if solution is not available 240 mL 12  . fluticasone (FLONASE) 50 MCG/ACT nasal spray Place into both nostrils daily.    . folic acid (FOLVITE) 1 MG tablet Take 1 tablet (1 mg total) by mouth  daily. 30 tablet 3  . hydrOXYzine (ATARAX/VISTARIL) 25 MG tablet Take 1 tablet (25 mg total) by mouth every 6 (six) hours as needed for itching. 12 tablet 0  . mirtazapine (REMERON) 15 MG tablet Take 15 mg by mouth at bedtime.    . Multiple Vitamins-Minerals (MULTIVITAMIN WITH MINERALS) tablet Take 1 tablet by mouth daily.    . pantoprazole (PROTONIX) 40 MG tablet Take 1 tablet (40 mg total) by mouth 2 (two) times daily before a meal. 60 tablet 3  . propranolol (INDERAL) 10 MG tablet Take 1 tablet (10 mg total) by mouth 2 (two) times daily. 60 tablet 3  . thiamine 100 MG tablet Take 1 tablet (100 mg total) by mouth daily. 30 tablet 3   No current facility-administered medications for this visit.    Review of Systems: No chest pain. No shortness of breath. No urinary complaints.   PHYSICAL EXAM :    Wt Readings from Last 3 Encounters:  11/08/20 201 lb (91.2 kg)  07/25/18 207 lb 6.4 oz (94.1 kg)  05/27/18 203 lb 12.8 oz (92.4 kg)    BP 126/68 (BP Location: Left Arm, Patient Position: Sitting, Cuff Size: Normal)   Pulse 72   Ht 5' 7.5" (1.715 m) Comment: height measured without shoes  Wt 201 lb (91.2 kg)   BMI 31.02 kg/m  Constitutional:  Pleasant male in no acute distress. Psychiatric: Normal mood and affect. Behavior is normal. EENT: Pupils normal.  Conjunctivae are normal. No scleral icterus. Neck supple.  Cardiovascular: Normal rate, regular rhythm. No edema Pulmonary/chest: Effort normal and breath sounds normal. No wheezing, rales or rhonchi. Abdominal: Soft, nondistended, nontender. Bowel sounds active throughout. There are no masses palpable. No hepatomegaly. Neurological: Alert and oriented to person place and time. Skin: Skin is warm and dry. No rashes noted.  Willette Cluster, NP  11/08/2020, 3:56 PM

## 2020-11-09 LAB — PROTIME-INR
INR: 1.1 ratio — ABNORMAL HIGH (ref 0.8–1.0)
Prothrombin Time: 12.8 s (ref 9.6–13.1)

## 2020-11-09 LAB — IBC PANEL
Iron: 14 ug/dL — ABNORMAL LOW (ref 42–165)
Saturation Ratios: 2.9 % — ABNORMAL LOW (ref 20.0–50.0)
Transferrin: 343 mg/dL (ref 212.0–360.0)

## 2020-11-09 LAB — FERRITIN: Ferritin: 18.1 ng/mL — ABNORMAL LOW (ref 22.0–322.0)

## 2020-11-11 LAB — AFP TUMOR MARKER: AFP-Tumor Marker: 7 ng/mL — ABNORMAL HIGH (ref <6.1)

## 2020-11-11 LAB — ANTI-SMOOTH MUSCLE ANTIBODY, IGG: Actin (Smooth Muscle) Antibody (IGG): <20 U (ref <20)

## 2020-11-11 LAB — CERULOPLASMIN: Ceruloplasmin: 38 mg/dL — ABNORMAL HIGH (ref 18–36)

## 2020-11-11 LAB — HEPATITIS A ANTIBODY, TOTAL: Hepatitis A AB,Total: NONREACTIVE

## 2020-11-11 LAB — ALPHA-1-ANTITRYPSIN: A-1 Antitrypsin, Ser: 227 mg/dL — ABNORMAL HIGH (ref 83–199)

## 2020-11-11 LAB — IGA: Immunoglobulin A: 444 mg/dL — ABNORMAL HIGH (ref 70–320)

## 2020-11-11 LAB — MITOCHONDRIAL ANTIBODIES: Mitochondrial M2 Ab, IgG: <=20 U

## 2020-11-11 LAB — ANA: Anti Nuclear Antibody (ANA): NEGATIVE

## 2020-11-11 LAB — TISSUE TRANSGLUTAMINASE, IGA: (tTG) Ab, IgA: <1 U/mL

## 2020-11-22 NOTE — Progress Notes (Signed)
Reviewed and agree with documentation and assessment and plan. K. Veena Clea Dubach , MD   

## 2020-12-16 ENCOUNTER — Other Ambulatory Visit: Payer: Self-pay | Admitting: Orthopedic Surgery

## 2021-01-13 NOTE — Progress Notes (Signed)
Attempted to obtain medical history via telephone, unable to reach at this time. I left a voicemail to return pre surgical testing department's phone call.  

## 2021-01-14 ENCOUNTER — Other Ambulatory Visit (HOSPITAL_COMMUNITY): Payer: BC Managed Care – PPO

## 2021-01-18 ENCOUNTER — Ambulatory Visit (HOSPITAL_COMMUNITY): Payer: BC Managed Care – PPO | Admitting: Anesthesiology

## 2021-01-18 ENCOUNTER — Encounter (HOSPITAL_COMMUNITY): Payer: Self-pay | Admitting: Gastroenterology

## 2021-01-18 ENCOUNTER — Ambulatory Visit (HOSPITAL_COMMUNITY)
Admission: RE | Admit: 2021-01-18 | Discharge: 2021-01-18 | Disposition: A | Discharge status: Home / Self Care | Payer: BC Managed Care – PPO | Attending: Gastroenterology | Admitting: Gastroenterology

## 2021-01-18 ENCOUNTER — Other Ambulatory Visit: Payer: Self-pay

## 2021-01-18 ENCOUNTER — Encounter (HOSPITAL_COMMUNITY)
Admission: RE | Disposition: A | Discharge status: Home / Self Care | Payer: Self-pay | Source: Home / Self Care | Attending: Gastroenterology

## 2021-01-18 DIAGNOSIS — K7460 Unspecified cirrhosis of liver: Secondary | ICD-10-CM

## 2021-01-18 DIAGNOSIS — I8510 Secondary esophageal varices without bleeding: Secondary | ICD-10-CM | POA: Diagnosis not present

## 2021-01-18 DIAGNOSIS — K766 Portal hypertension: Secondary | ICD-10-CM | POA: Diagnosis not present

## 2021-01-18 DIAGNOSIS — K7030 Alcoholic cirrhosis of liver without ascites: Secondary | ICD-10-CM | POA: Diagnosis not present

## 2021-01-18 DIAGNOSIS — Z7984 Long term (current) use of oral hypoglycemic drugs: Secondary | ICD-10-CM | POA: Diagnosis not present

## 2021-01-18 DIAGNOSIS — J309 Allergic rhinitis, unspecified: Secondary | ICD-10-CM | POA: Diagnosis not present

## 2021-01-18 DIAGNOSIS — I8500 Esophageal varices without bleeding: Secondary | ICD-10-CM | POA: Diagnosis not present

## 2021-01-18 DIAGNOSIS — Z08 Encounter for follow-up examination after completed treatment for malignant neoplasm: Secondary | ICD-10-CM | POA: Insufficient documentation

## 2021-01-18 DIAGNOSIS — Z79899 Other long term (current) drug therapy: Secondary | ICD-10-CM | POA: Insufficient documentation

## 2021-01-18 DIAGNOSIS — Z87891 Personal history of nicotine dependence: Secondary | ICD-10-CM | POA: Diagnosis not present

## 2021-01-18 DIAGNOSIS — E785 Hyperlipidemia, unspecified: Secondary | ICD-10-CM | POA: Diagnosis not present

## 2021-01-18 DIAGNOSIS — I10 Essential (primary) hypertension: Secondary | ICD-10-CM | POA: Diagnosis not present

## 2021-01-18 HISTORY — PX: ESOPHAGOGASTRODUODENOSCOPY (EGD) WITH PROPOFOL: SHX5813

## 2021-01-18 LAB — GLUCOSE, CAPILLARY: Glucose-Capillary: 96 mg/dL (ref 70–99)

## 2021-01-18 SURGERY — ESOPHAGOGASTRODUODENOSCOPY (EGD) WITH PROPOFOL
Anesthesia: Monitor Anesthesia Care

## 2021-01-18 MED ORDER — PROPOFOL 500 MG/50ML IV EMUL
INTRAVENOUS | Status: DC | PRN
  Administered 2021-01-18: 135 ug/kg/min via INTRAVENOUS

## 2021-01-18 MED ORDER — SODIUM CHLORIDE 0.9 % IV SOLN: INTRAVENOUS | Status: DC

## 2021-01-18 MED ORDER — LACTATED RINGERS IV SOLN
INTRAVENOUS | Status: DC
  Administered 2021-01-18: 1000 mL via INTRAVENOUS

## 2021-01-18 MED ORDER — PROPOFOL 10 MG/ML IV BOLUS
INTRAVENOUS | Status: DC | PRN
  Administered 2021-01-18 (×2): 10 mg via INTRAVENOUS
  Administered 2021-01-18 (×2): 20 mg via INTRAVENOUS

## 2021-01-18 MED ORDER — PROPOFOL 10 MG/ML IV BOLUS
INTRAVENOUS | Status: AC
  Filled 2021-01-18: qty 20

## 2021-01-18 MED ORDER — PROPOFOL 500 MG/50ML IV EMUL
INTRAVENOUS | Status: AC
  Filled 2021-01-18: qty 50

## 2021-01-18 SURGICAL SUPPLY — 14 items

## 2021-01-18 NOTE — Anesthesia Procedure Notes (Signed)
Procedure Name: MAC Date/Time: 01/18/2021 9:35 AM Performed by: Niel Hummer, CRNA Pre-anesthesia Checklist: Patient identified, Emergency Drugs available, Suction available and Patient being monitored Oxygen Delivery Method: Simple face mask

## 2021-01-18 NOTE — Anesthesia Postprocedure Evaluation (Signed)
Anesthesia Post Note  Patient: Nathan Roberts  Procedure(s) Performed: ESOPHAGOGASTRODUODENOSCOPY (EGD) WITH PROPOFOL     Patient location during evaluation: Endoscopy Anesthesia Type: MAC Level of consciousness: awake and alert Pain management: pain level controlled Vital Signs Assessment: post-procedure vital signs reviewed and stable Respiratory status: spontaneous breathing, nonlabored ventilation, respiratory function stable and patient connected to nasal cannula oxygen Cardiovascular status: stable and blood pressure returned to baseline Postop Assessment: no apparent nausea or vomiting Anesthetic complications: no   No notable events documented.  Last Vitals:  Vitals:   01/18/21 1004 01/18/21 1015  BP: (!) 155/71 (!) 172/70  Pulse: 63 (!) 58  Resp: 14 16  Temp:    SpO2: 100% 99%    Last Pain:  Vitals:   01/18/21 1015  TempSrc:   PainSc: 0-No pain                 Belenda Cruise P Pailynn Vahey

## 2021-01-18 NOTE — Op Note (Signed)
[facility] Patient Name: Nathan Roberts Procedure Date: 01/18/2021 MRN: [id number] Attending MD: Napoleon Form , MD Date of Birth: Oct 26, 1960 CSN: [id number] Age: 60 Admit Type: Outpatient Procedure:                Upper GI endoscopy Indications:              Follow-up of esophageal varices in patient with                            suspected portal hypertension Providers:                Napoleon Form, MD, Fransisca Connors, Harrington Challenger, Technician, Yevonne Pax, CRNA Referring MD:              Medicines:                Monitored Anesthesia Care Complications:            No immediate complications. Estimated Blood Loss:     Estimated blood loss: none. Procedure:                Pre-Anesthesia Assessment:                           - Prior to the procedure, a History and Physical                            was performed, and patient medications and                            allergies were reviewed. The patient's tolerance of                            previous anesthesia was also reviewed. The risks                            and benefits of the procedure and the sedation                            options and risks were discussed with the patient.                            All questions were answered, and informed consent                            was obtained. Prior Anticoagulants: The patient has                            taken no previous anticoagulant or antiplatelet                            agents. ASA Grade Assessment: III - A patient with  severe systemic disease. After reviewing the risks                            and benefits, the patient was deemed in                            satisfactory condition to undergo the procedure.                           After obtaining informed consent, the endoscope was                            passed under direct vision. Throughout the                             procedure, the patient's blood pressure, pulse, and                            oxygen saturations were monitored continuously. The                            GIF-H190 ([id number] Olympus gastroscope was                            introduced through the mouth, and advanced to the                            second part of duodenum. The upper GI endoscopy was                            accomplished without difficulty. The patient                            tolerated the procedure well. Scope In: Scope Out: Findings:      Grade I, small (< 5 mm) varices were found in the lower third of the       esophagus. They were less than 5 mm in largest diameter. Prior banding       sites appear well healed      The stomach was normal.      The cardia and gastric fundus were normal on retroflexion.      The examined duodenum was normal. Impression:               - Grade I and small (< 5 mm) esophageal varices.                           - Normal stomach.                           - Normal examined duodenum.                           - No specimens collected. Moderate Sedation:      Not Applicable - Patient had care per Anesthesia. Recommendation:           -  Patient has a contact number available for                            emergencies. The signs and symptoms of potential                            delayed complications were discussed with the                            patient. Return to normal activities tomorrow.                            Written discharge instructions were provided to the                            patient.                           - Resume previous diet.                           - Continue present medications.                           - Follow up office visit in 2 months, next                            available appointment for cirrhosis                           -Continue to abstain from alcohol Procedure Code(s):        --- Professional ---                            [phone], Esophagogastroduodenoscopy, flexible,                            transoral; diagnostic, including collection of                            specimen(s) by brushing or washing, when performed                            (separate procedure) Diagnosis Code(s):        --- Professional ---                           I85.00, Esophageal varices without bleeding CPT copyright 2019 American Medical Association. All rights reserved. The codes documented in this report are preliminary and upon coder review may  be revised to meet current compliance requirements. Napoleon Form, MD 01/18/2021 9:59:26 AM This report has been signed electronically. Number of Addenda: 0

## 2021-01-18 NOTE — Discharge Instructions (Addendum)
YOU HAD AN ENDOSCOPIC PROCEDURE TODAY: Refer to the procedure report and other information in the discharge instructions given to you for any specific questions about what was found during the examination. If this information does not answer your questions, please call [facility] office at [phone] to clarify.   YOU SHOULD EXPECT: Some feelings of bloating in the abdomen. Passage of more gas than usual. Walking can help get rid of the air that was put into your GI tract during the procedure and reduce the bloating.   DIET: Your first meal following the procedure should be a light meal and then it is ok to progress to your normal diet. A half-sandwich or bowl of soup is an example of a good first meal. Heavy or fried foods are harder to digest and may make you feel nauseous or bloated. Drink plenty of fluids but you should avoid alcoholic beverages for 24 hours.     ACTIVITY: Your care partner should take you home directly after the procedure. You should plan to take it easy, moving slowly for the rest of the day. You can resume normal activity the day after the procedure however YOU SHOULD NOT DRIVE, use power tools, machinery or perform tasks that involve climbing or major physical exertion for 24 hours (because of the sedation medicines used during the test).   SYMPTOMS TO REPORT IMMEDIATELY: A gastroenterologist can be reached at any hour. Please call [phone]  for any of the following symptoms:   Following upper endoscopy (EGD, EUS, ERCP, esophageal dilation) Vomiting of blood or coffee ground material  New, significant abdominal pain  New, significant chest pain or pain under the shoulder blades  Painful or persistently difficult swallowing  New shortness of breath  Black, tarry-looking or red, bloody stools  FOLLOW UP:  If any biopsies were taken you will be contacted by phone or by letter within the next 1-3 weeks. Call [phone]  if you have not heard about the biopsies in 3  weeks.  Please also call with any specific questions about appointments or follow up tests.  

## 2021-01-18 NOTE — Transfer of Care (Signed)
Immediate Anesthesia Transfer of Care Note  Patient: Nathan Roberts  Procedure(s) Performed: ESOPHAGOGASTRODUODENOSCOPY (EGD) WITH PROPOFOL  Patient Location: PACU  Anesthesia Type:MAC  Level of Consciousness: awake, alert  and oriented  Airway & Oxygen Therapy: Patient Spontanous Breathing and Patient connected to face mask oxygen  Post-op Assessment: Report given to RN, Post -op Vital signs reviewed and stable and Patient moving all extremities X 4  Post vital signs: Reviewed and stable  Last Vitals:  Vitals Value Taken Time  BP 156/81   Temp    Pulse 71 01/18/21 0952  Resp 20 01/18/21 0952  SpO2 100 % 01/18/21 0952  Vitals shown include unvalidated device data.  Last Pain:  Vitals:   01/18/21 0811  TempSrc: Oral  PainSc: 0-No pain         Complications: No notable events documented.

## 2021-01-18 NOTE — H&P (Signed)
[facility] History and Physical   Primary Care Physician:  Jarrett Soho, PA-C   Reason for Procedure:   Cirrhosis with portal hypertension and esophageal varices  Plan:   EGD with possible esophageal varices banding     HPI: Nathan Roberts is a 60 y.o.  old very pleasant gentleman with ETOH cirrhosis decompensated by esophageal variceal hemorrhage s/p EGD with banding 10/11/20 He is here for surveillance EGD and eradication of esophageal varices The risks and benefits as well as alternatives of endoscopic procedure(s) have been discussed and reviewed. All questions answered. The patient agrees to proceed.   Past Medical History:  Diagnosis Date   Allergy    Arthritis    Carpal tunnel syndrome    Esophageal varices (HCC)    GERD (gastroesophageal reflux disease)    GI bleed    Hyperlipidemia    Hypertension    Portal hypertensive gastropathy (HCC)    Pre-diabetes     Past Surgical History:  Procedure Laterality Date   abscess     rectal area   CARPAL TUNNEL RELEASE     left   CERVICAL FUSION     ESOPHAGEAL BANDING  10/07/2020   Procedure: ESOPHAGEAL BANDING;  Surgeon: Napoleon Form, MD;  Location: [facility];  Service: Endoscopy;;   ESOPHAGOGASTRODUODENOSCOPY (EGD) WITH PROPOFOL N/A 10/07/2020   Procedure: ESOPHAGOGASTRODUODENOSCOPY (EGD) WITH PROPOFOL;  Surgeon: Napoleon Form, MD;  Location: [facility];  Service: Endoscopy;  Laterality: N/A;   SPINE SURGERY      Prior to Admission medications   Medication Sig Start Date End Date Taking? Authorizing Provider  amLODipine (NORVASC) 10 MG tablet Take 10 mg by mouth in the morning. 11/27/20  Yes [provider]  hydrOXYzine (ATARAX/VISTARIL) 25 MG tablet Take 1 tablet (25 mg total) by mouth every 6 (six) hours as needed for itching. 10/12/20  Yes Ivette Loyal, NP  metFORMIN (GLUCOPHAGE) 500 MG tablet Take 500 mg by mouth once a week.   Yes [provider]  Multiple  Vitamins-Minerals (MULTIVITAMIN WITH MINERALS) tablet Take 1 tablet by mouth daily.   Yes [provider]  dicyclomine (BENTYL) 10 MG/5ML solution Take 5 mLs (10 mg total) by mouth 4 (four) times daily -  before meals and at bedtime. May use dicyclomine caps if solution is not available Patient not taking: Reported on 01/14/2021 10/08/20   Rai, Delene Ruffini, MD  folic acid (FOLVITE) 1 MG tablet Take 1 tablet (1 mg total) by mouth daily. Patient not taking: Reported on 01/14/2021 10/09/20   Rai, Delene Ruffini, MD  pantoprazole (PROTONIX) 40 MG tablet Take 1 tablet (40 mg total) by mouth 2 (two) times daily before a meal. Patient not taking: Reported on 01/14/2021 10/08/20   Rai, Delene Ruffini, MD  predniSONE (STERAPRED UNI-PAK 21 TAB) 10 MG (21) TBPK tablet Take by mouth daily. Take 6 tabs by mouth daily  for 2 days, then 5 tabs for 2 days, then 4 tabs for 2 days, then 3 tabs for 2 days, 2 tabs for 2 days, then 1 tab by mouth daily for 2 days Patient not taking: Reported on 01/14/2021 10/12/20   Ivette Loyal, NP  propranolol (INDERAL) 10 MG tablet Take 2 tablets in the morning and 1 tablet in the evening Patient not taking: Reported on 01/14/2021 11/08/20   Meredith Pel, NP  thiamine 100 MG tablet Take 1 tablet (100 mg total) by mouth daily. Patient not taking: Reported on 01/14/2021 10/09/20   Rai,  Ripudeep K, MD    Current Facility-Administered Medications  Medication Dose Route Frequency Provider Last Rate Last Admin   0.9 %  sodium chloride infusion   Intravenous Continuous Meredith Pel, NP       lactated ringers infusion   Intravenous Continuous Tona Qualley V, MD 20 mL/hr at 01/18/21 0830 1,000 mL at 01/18/21 0830    Allergies as of 11/08/2020 - Review Complete 11/08/2020  Allergen Reaction Noted   Cephalexin Rash 08/12/2017   Doxycycline Rash 10/06/2020   Latex Hives and Rash 11/01/2011    Family History  Problem Relation Age of Onset   Early death Mother    Lung cancer  Mother 37       lung--smoker   Atrial fibrillation Father    Heart disease Father    COPD Father    Hyperlipidemia Father    Hypertension Father    Colon cancer Neg Hx    Esophageal cancer Neg Hx    Stomach cancer Neg Hx    Rectal cancer Neg Hx     Social History   Socioeconomic History   Marital status: Married    Spouse name: Not on file   Number of children: Not on file   Years of education: Not on file   Highest education level: Not on file  Occupational History   Not on file  Tobacco Use   Smoking status: Former    Pack years: 0.00    Types: Cigarettes    Quit date: 12/08/1996    Years since quitting: 24.1   Smokeless tobacco: Never  Vaping Use   Vaping Use: Never used  Substance and Sexual Activity   Alcohol use: Yes    Alcohol/week: 9.0 standard drinks    Types: 2 Glasses of wine, 5 Cans of beer, 1 Shots of liquor, 1 Standard drinks or equivalent per week   Drug use: No   Sexual activity: Not Currently  Other Topics Concern   Not on file  Social History Narrative   Not on file   Social Determinants of Health   Financial Resource Strain: Not on file  Food Insecurity: Not on file  Transportation Needs: Not on file  Physical Activity: Not on file  Stress: Not on file  Social Connections: Not on file  Intimate Partner Violence: Not on file    Review of Systems:  All other review of systems negative except as mentioned in the HPI.  Physical Exam: Vital signs in last 24 hours: Temp:  [97.6 F (36.4 C)] 97.6 F (36.4 C) (06/21 0811) Pulse Rate:  [60] 60 (06/21 0811) Resp:  [17] 17 (06/21 0811) BP: (170)/(83) 170/83 (06/21 0811) SpO2:  [98 %] 98 % (06/21 0811)   General:   Alert, NAD Lungs:  Clear .   Heart:  Regular rate and rhythm Abdomen:  Soft, nontender and nondistended. Neuro/Psych:  Alert and cooperative. Normal mood and affect. A and O x 3   K. Scherry Ran , MD [phone]

## 2021-01-18 NOTE — Anesthesia Preprocedure Evaluation (Signed)
Anesthesia Evaluation  Patient identified by MRN, date of birth, ID band Patient awake    Reviewed: Allergy & Precautions, NPO status , Patient's Chart, lab work & pertinent test results  Airway Mallampati: II  TM Distance: >3 FB Neck ROM: Full    Dental  (+) Teeth Intact   Pulmonary neg pulmonary ROS, former smoker,    Pulmonary exam normal        Cardiovascular hypertension, Pt. on medications and Pt. on home beta blockers  Rhythm:Regular Rate:Normal     Neuro/Psych  Headaches, Anxiety    GI/Hepatic GERD  Medicated,(+) Cirrhosis   Esophageal Varices    ,   Endo/Other  diabetes, Type 2, Oral Hypoglycemic Agents  Renal/GU negative Renal ROS  negative genitourinary   Musculoskeletal  (+) Arthritis ,   Abdominal (+)  Abdomen: soft. Bowel sounds: normal.  Peds  Hematology  (+) anemia ,   Anesthesia Other Findings   Reproductive/Obstetrics                             Anesthesia Physical Anesthesia Plan  ASA: 3  Anesthesia Plan: MAC   Post-op Pain Management:    Induction: Intravenous  PONV Risk Score and Plan: 1 and Propofol infusion and Treatment may vary due to age or medical condition  Airway Management Planned: Simple Face Mask, Natural Airway and Nasal Cannula  Additional Equipment: None  Intra-op Plan:   Post-operative Plan:   Informed Consent: I have reviewed the patients History and Physical, chart, labs and discussed the procedure including the risks, benefits and alternatives for the proposed anesthesia with the patient or authorized representative who has indicated his/her understanding and acceptance.     Dental advisory given  Plan Discussed with: CRNA  Anesthesia Plan Comments: (Lab Results      Component                Value               Date                      WBC                                          11/08/2020            2.8 Repeated and  verified X2. (L)      HGB                      10.4 (L)            11/08/2020                HCT                      33.2 (L)            11/08/2020                MCV                      80.8                11/08/2020                PLT  141.0 (L)           11/08/2020           Lab Results      Component                Value               Date                      NA                       132 (L)             10/08/2020                K                        3.8                 10/08/2020                CO2                      23                  10/08/2020                GLUCOSE                  140 (H)             10/08/2020                BUN                      8                   10/08/2020                CREATININE               0.62                10/08/2020                CALCIUM                  7.8 (L)             10/08/2020                GFRNONAA                 >60                 10/08/2020                GFRAA                    117                 05/27/2018          )        Anesthesia Quick Evaluation

## 2021-01-20 ENCOUNTER — Encounter (HOSPITAL_COMMUNITY): Payer: Self-pay | Admitting: Gastroenterology

## 2021-01-21 ENCOUNTER — Other Ambulatory Visit: Payer: Self-pay

## 2021-01-21 DIAGNOSIS — K7460 Unspecified cirrhosis of liver: Secondary | ICD-10-CM

## 2021-01-21 NOTE — Telephone Encounter (Signed)
-----   Message from Napoleon Form, MD sent at 01/18/2021 10:00 AM EDT ----- He will need follow up with me or APP in about 8 weeks. He should get labs cbc, CMP, PT/INR and AFP prior to visit. If AFP still elevated will need MRI liver protocol. Thanks VN

## 2021-02-03 ENCOUNTER — Other Ambulatory Visit: Payer: Self-pay

## 2021-02-03 ENCOUNTER — Encounter (HOSPITAL_BASED_OUTPATIENT_CLINIC_OR_DEPARTMENT_OTHER): Payer: Self-pay | Admitting: Orthopedic Surgery

## 2021-02-04 ENCOUNTER — Other Ambulatory Visit: Payer: Self-pay

## 2021-02-07 ENCOUNTER — Other Ambulatory Visit: Payer: Self-pay | Admitting: Orthopedic Surgery

## 2021-02-09 ENCOUNTER — Ambulatory Visit (HOSPITAL_COMMUNITY)
Admission: EM | Admit: 2021-02-09 | Discharge: 2021-02-09 | Discharge status: Against Medical Advice | Payer: BC Managed Care – PPO

## 2021-02-09 ENCOUNTER — Encounter (HOSPITAL_BASED_OUTPATIENT_CLINIC_OR_DEPARTMENT_OTHER)
Admission: RE | Admit: 2021-02-09 | Discharge: 2021-02-09 | Disposition: A | Discharge status: Home / Self Care | Payer: BC Managed Care – PPO | Source: Ambulatory Visit | Attending: Orthopedic Surgery | Admitting: Orthopedic Surgery

## 2021-02-09 ENCOUNTER — Other Ambulatory Visit: Payer: Self-pay

## 2021-02-09 DIAGNOSIS — Z79899 Other long term (current) drug therapy: Secondary | ICD-10-CM | POA: Diagnosis not present

## 2021-02-09 DIAGNOSIS — Z981 Arthrodesis status: Secondary | ICD-10-CM | POA: Diagnosis not present

## 2021-02-09 DIAGNOSIS — M720 Palmar fascial fibromatosis [Dupuytren]: Secondary | ICD-10-CM | POA: Diagnosis not present

## 2021-02-09 DIAGNOSIS — Z87891 Personal history of nicotine dependence: Secondary | ICD-10-CM | POA: Diagnosis not present

## 2021-02-09 LAB — COMPREHENSIVE METABOLIC PANEL
ALT: 27 U/L (ref 0–44)
AST: 51 U/L — ABNORMAL HIGH (ref 15–41)
Albumin: 3.3 g/dL — ABNORMAL LOW (ref 3.5–5.0)
Alkaline Phosphatase: 167 U/L — ABNORMAL HIGH (ref 38–126)
Anion gap: 6 (ref 5–15)
BUN: 8 mg/dL (ref 8–23)
CO2: 28 mmol/L (ref 22–32)
Calcium: 8.8 mg/dL — ABNORMAL LOW (ref 8.9–10.3)
Chloride: 103 mmol/L (ref 98–111)
Creatinine, Ser: 0.58 mg/dL — ABNORMAL LOW (ref 0.61–1.24)
GFR, Estimated: >60 mL/min (ref >60)
Glucose, Bld: 104 mg/dL — ABNORMAL HIGH (ref 70–99)
Potassium: 4 mmol/L (ref 3.5–5.1)
Sodium: 137 mmol/L (ref 135–145)
Total Bilirubin: 1.1 mg/dL (ref 0.3–1.2)
Total Protein: 7.6 g/dL (ref 6.5–8.1)

## 2021-02-09 NOTE — Progress Notes (Signed)

## 2021-02-09 NOTE — Anesthesia Preprocedure Evaluation (Addendum)
Anesthesia Evaluation  Patient identified by MRN, date of birth, ID band Patient awake    Reviewed: Allergy & Precautions, NPO status , Patient's Chart, lab work & pertinent test results  History of Anesthesia Complications Negative for: history of anesthetic complications  Airway Mallampati: II  TM Distance: >3 FB Neck ROM: Full    Dental no notable dental hx.    Pulmonary former smoker,    Pulmonary exam normal        Cardiovascular hypertension, Pt. on medications Normal cardiovascular exam     Neuro/Psych  Headaches, Anxiety    GI/Hepatic GERD  Medicated and Controlled,(+) Cirrhosis   Esophageal Varices    ,   Endo/Other  diabetes, Type 2, Oral Hypoglycemic Agents  Renal/GU negative Renal ROS  negative genitourinary   Musculoskeletal  (+) Arthritis , DUPUYTREN'S LEFT FIRST WEB, LEFT RING FINGER, AND LEFT SMALL FINGER   Abdominal   Peds  Hematology  (+) anemia ,   Anesthesia Other Findings Day of surgery medications reviewed with patient.  Reproductive/Obstetrics negative OB ROS                            Anesthesia Physical Anesthesia Plan  ASA: 3  Anesthesia Plan: Regional and MAC   Post-op Pain Management:    Induction:   PONV Risk Score and Plan: 1 and Treatment may vary due to age or medical condition, Propofol infusion, Ondansetron and Midazolam  Airway Management Planned: Natural Airway and Simple Face Mask  Additional Equipment: None  Intra-op Plan:   Post-operative Plan:   Informed Consent: I have reviewed the patients History and Physical, chart, labs and discussed the procedure including the risks, benefits and alternatives for the proposed anesthesia with the patient or authorized representative who has indicated his/her understanding and acceptance.       Plan Discussed with: CRNA  Anesthesia Plan Comments:        Anesthesia Quick Evaluation

## 2021-02-10 ENCOUNTER — Ambulatory Visit (HOSPITAL_BASED_OUTPATIENT_CLINIC_OR_DEPARTMENT_OTHER): Payer: BC Managed Care – PPO | Admitting: Anesthesiology

## 2021-02-10 ENCOUNTER — Encounter (HOSPITAL_BASED_OUTPATIENT_CLINIC_OR_DEPARTMENT_OTHER): Payer: Self-pay | Admitting: Orthopedic Surgery

## 2021-02-10 ENCOUNTER — Other Ambulatory Visit: Payer: Self-pay

## 2021-02-10 ENCOUNTER — Encounter (HOSPITAL_BASED_OUTPATIENT_CLINIC_OR_DEPARTMENT_OTHER)
Admission: RE | Disposition: A | Discharge status: Home / Self Care | Payer: Self-pay | Source: Home / Self Care | Attending: Orthopedic Surgery

## 2021-02-10 ENCOUNTER — Ambulatory Visit (HOSPITAL_BASED_OUTPATIENT_CLINIC_OR_DEPARTMENT_OTHER)
Admission: RE | Admit: 2021-02-10 | Discharge: 2021-02-10 | Disposition: A | Discharge status: Home / Self Care | Payer: BC Managed Care – PPO | Attending: Orthopedic Surgery | Admitting: Orthopedic Surgery

## 2021-02-10 DIAGNOSIS — E119 Type 2 diabetes mellitus without complications: Secondary | ICD-10-CM | POA: Diagnosis not present

## 2021-02-10 DIAGNOSIS — Z981 Arthrodesis status: Secondary | ICD-10-CM | POA: Insufficient documentation

## 2021-02-10 DIAGNOSIS — Z87891 Personal history of nicotine dependence: Secondary | ICD-10-CM | POA: Diagnosis not present

## 2021-02-10 DIAGNOSIS — I10 Essential (primary) hypertension: Secondary | ICD-10-CM | POA: Diagnosis not present

## 2021-02-10 DIAGNOSIS — M720 Palmar fascial fibromatosis [Dupuytren]: Secondary | ICD-10-CM | POA: Insufficient documentation

## 2021-02-10 DIAGNOSIS — Z79899 Other long term (current) drug therapy: Secondary | ICD-10-CM | POA: Insufficient documentation

## 2021-02-10 DIAGNOSIS — M24542 Contracture, left hand: Secondary | ICD-10-CM | POA: Diagnosis not present

## 2021-02-10 HISTORY — PX: FASCIECTOMY: SHX6525

## 2021-02-10 HISTORY — DX: Type 2 diabetes mellitus without complications: E11.9

## 2021-02-10 LAB — GLUCOSE, CAPILLARY
Glucose-Capillary: 126 mg/dL — ABNORMAL HIGH (ref 70–99)
Glucose-Capillary: 112 mg/dL — ABNORMAL HIGH (ref 70–99)

## 2021-02-10 SURGERY — FASCIECTOMY, PALM
Anesthesia: Monitor Anesthesia Care | Site: Hand | Laterality: Left

## 2021-02-10 MED ORDER — CLINDAMYCIN PHOSPHATE 900 MG/50ML IV SOLN: 900.0000 mg | INTRAVENOUS | Status: DC

## 2021-02-10 MED ORDER — THROMBIN 5000 UNITS EX SOLR
CUTANEOUS | Status: AC
  Filled 2021-02-10: qty 5000

## 2021-02-10 MED ORDER — THROMBIN 5000 UNITS EX SOLR
CUTANEOUS | Status: DC | PRN
  Administered 2021-02-10: 5000 [IU] via TOPICAL

## 2021-02-10 MED ORDER — CLINDAMYCIN PHOSPHATE 900 MG/50ML IV SOLN
900.0000 mg | INTRAVENOUS | Status: AC
  Administered 2021-02-10: 900 mg via INTRAVENOUS

## 2021-02-10 MED ORDER — PROPOFOL 10 MG/ML IV BOLUS
INTRAVENOUS | Status: AC
  Filled 2021-02-10: qty 20

## 2021-02-10 MED ORDER — HYDROCODONE-ACETAMINOPHEN 5-325 MG PO TABS: 1.0000 | ORAL_TABLET | Freq: Four times a day (QID) | ORAL | 0 refills | Status: DC | PRN

## 2021-02-10 MED ORDER — PROPOFOL 500 MG/50ML IV EMUL
INTRAVENOUS | Status: DC | PRN
  Administered 2021-02-10 (×3): 20 mg via INTRAVENOUS

## 2021-02-10 MED ORDER — FENTANYL CITRATE (PF) 100 MCG/2ML IJ SOLN
INTRAMUSCULAR | Status: AC
  Filled 2021-02-10: qty 2

## 2021-02-10 MED ORDER — PROPOFOL 500 MG/50ML IV EMUL
INTRAVENOUS | Status: DC | PRN
  Administered 2021-02-10: 75 ug/kg/min via INTRAVENOUS

## 2021-02-10 MED ORDER — 0.9 % SODIUM CHLORIDE (POUR BTL) OPTIME
TOPICAL | Status: DC | PRN
  Administered 2021-02-10: 1000 mL

## 2021-02-10 MED ORDER — CLONIDINE HCL (ANALGESIA) 100 MCG/ML EP SOLN
EPIDURAL | Status: DC | PRN
  Administered 2021-02-10: 100 ug

## 2021-02-10 MED ORDER — PROPOFOL 500 MG/50ML IV EMUL
INTRAVENOUS | Status: AC
  Filled 2021-02-10: qty 50

## 2021-02-10 MED ORDER — LACTATED RINGERS IV SOLN: INTRAVENOUS | Status: DC

## 2021-02-10 MED ORDER — ONDANSETRON HCL 4 MG/2ML IJ SOLN
INTRAMUSCULAR | Status: DC | PRN
  Administered 2021-02-10: 4 mg via INTRAVENOUS

## 2021-02-10 MED ORDER — CLINDAMYCIN PHOSPHATE 900 MG/50ML IV SOLN
INTRAVENOUS | Status: AC
  Filled 2021-02-10: qty 50

## 2021-02-10 MED ORDER — BUPIVACAINE-EPINEPHRINE (PF) 0.5% -1:200000 IJ SOLN
INTRAMUSCULAR | Status: DC | PRN
  Administered 2021-02-10: 30 mL via PERINEURAL

## 2021-02-10 MED ORDER — FENTANYL CITRATE (PF) 100 MCG/2ML IJ SOLN
100.0000 ug | Freq: Once | INTRAMUSCULAR | Status: AC
  Administered 2021-02-10: 50 ug via INTRAVENOUS

## 2021-02-10 MED ORDER — MIDAZOLAM HCL 2 MG/2ML IJ SOLN
2.0000 mg | Freq: Once | INTRAMUSCULAR | Status: AC
  Administered 2021-02-10: 1 mg via INTRAVENOUS

## 2021-02-10 MED ORDER — MIDAZOLAM HCL 2 MG/2ML IJ SOLN
INTRAMUSCULAR | Status: AC
  Filled 2021-02-10: qty 2

## 2021-02-10 SURGICAL SUPPLY — 44 items
APL PRP STRL LF DISP 70% ISPRP (MISCELLANEOUS) ×1
BLADE MINI RND TIP GREEN BEAV (BLADE) ×2 IMPLANT
BLADE SURG 15 STRL LF DISP TIS (BLADE) ×1 IMPLANT
BLADE SURG 15 STRL SS (BLADE) ×2
BNDG CMPR 9X4 STRL LF SNTH (GAUZE/BANDAGES/DRESSINGS) ×1
BNDG COHESIVE 3X5 TAN STRL LF (GAUZE/BANDAGES/DRESSINGS) ×2 IMPLANT
BNDG ESMARK 4X9 LF (GAUZE/BANDAGES/DRESSINGS) ×2 IMPLANT
BNDG GAUZE ELAST 4 BULKY (GAUZE/BANDAGES/DRESSINGS) ×2 IMPLANT
CHLORAPREP W/TINT 26 (MISCELLANEOUS) ×2 IMPLANT
CORD BIPOLAR FORCEPS 12FT (ELECTRODE) ×2 IMPLANT
COVER BACK TABLE 60X90IN (DRAPES) ×2 IMPLANT
COVER MAYO STAND STRL (DRAPES) ×2 IMPLANT
CUFF TOURN SGL QUICK 18X4 (TOURNIQUET CUFF) ×2 IMPLANT
DRAPE EXTREMITY T 121X128X90 (DISPOSABLE) ×2 IMPLANT
DRAPE SURG 17X23 STRL (DRAPES) ×2 IMPLANT
GAUZE SPONGE 4X4 12PLY STRL (GAUZE/BANDAGES/DRESSINGS) ×2 IMPLANT
GAUZE XEROFORM 1X8 LF (GAUZE/BANDAGES/DRESSINGS) ×2 IMPLANT
GLOVE SRG 8 PF TXTR STRL LF DI (GLOVE) ×1 IMPLANT
GLOVE SURG POLYISO LF SZ6.5 (GLOVE) ×2 IMPLANT
GLOVE SURG POLYISO LF SZ7.5 (GLOVE) ×2 IMPLANT
GLOVE SURG POLYISO LF SZ8 (GLOVE) ×2 IMPLANT
GLOVE SURG UNDER POLY LF SZ7 (GLOVE) ×4 IMPLANT
GLOVE SURG UNDER POLY LF SZ8 (GLOVE) ×2
GLOVE SURG UNDER POLY LF SZ8.5 (GLOVE) ×2 IMPLANT
GOWN STRL REUS W/ TWL LRG LVL3 (GOWN DISPOSABLE) ×1 IMPLANT
GOWN STRL REUS W/TWL LRG LVL3 (GOWN DISPOSABLE) ×2
GOWN STRL REUS W/TWL XL LVL3 (GOWN DISPOSABLE) ×4 IMPLANT
LOOP VESSEL MAXI BLUE (MISCELLANEOUS) ×2 IMPLANT
NEEDLE HYPO 22GX1.5 SAFETY (NEEDLE) ×2 IMPLANT
NS IRRIG 1000ML POUR BTL (IV SOLUTION) ×2 IMPLANT
PACK BASIN DAY SURGERY FS (CUSTOM PROCEDURE TRAY) ×2 IMPLANT
PAD CAST 3X4 CTTN HI CHSV (CAST SUPPLIES) ×1 IMPLANT
PADDING CAST COTTON 3X4 STRL (CAST SUPPLIES) ×2
SLEEVE SCD COMPRESS KNEE MED (STOCKING) ×2 IMPLANT
SLING ARM FOAM STRAP LRG (SOFTGOODS) ×2 IMPLANT
SPLINT PLASTER CAST XFAST 3X15 (CAST SUPPLIES) IMPLANT
SPLINT PLASTER XTRA FASTSET 3X (CAST SUPPLIES)
STOCKINETTE 4X48 STRL (DRAPES) ×2 IMPLANT
SUT ETHILON 4 0 PS 2 18 (SUTURE) ×8 IMPLANT
SUT SILK 2 0 PERMA HAND 18 BK (SUTURE) ×4 IMPLANT
SYR BULB EAR ULCER 3OZ GRN STR (SYRINGE) ×2 IMPLANT
SYR CONTROL 10ML LL (SYRINGE) ×2 IMPLANT
TOWEL GREEN STERILE FF (TOWEL DISPOSABLE) ×4 IMPLANT
UNDERPAD 30X36 HEAVY ABSORB (UNDERPADS AND DIAPERS) ×2 IMPLANT

## 2021-02-10 NOTE — Op Note (Signed)
NAME: Nathan Roberts MEDICAL RECORD NO: [id number] DATE OF BIRTH: 1961/05/16 FACILITY: [facility] LOCATION: [facility] PHYSICIAN: Nicki Reaper, MD   OPERATIVE REPORT   DATE OF PROCEDURE: 02/10/21    PREOPERATIVE DIAGNOSIS:   Joints contracture left hand first webspace ring and small finger   POSTOPERATIVE DIAGNOSIS: Same   PROCEDURE: Fasciectomy ring and small finger with V-Y advancement's and excision cord first webspace with V-Y advancement   SURGEON: Cindee Salt, M.D.   ASSISTANT: Betha Loa, MD   ANESTHESIA:  Regional with sedation and A regional block had been performed by anesthesia in preoperative holding.     INTRAVENOUS FLUIDS:  Per anesthesia flow sheet.   ESTIMATED BLOOD LOSS:  Minimal.   COMPLICATIONS:  None.   SPECIMENS: Palmar fascia   TOURNIQUET TIME:    Total Tourniquet Time Documented: Upper Arm (Left) - 94 minutes Total: Upper Arm (Left) - 94 minutes    DISPOSITION:  Stable to PACU.   INDICATIONS: Patient is a 60 year old male with a history of Dupuytren's contracture bilateral hands.  He has significant contracture of his small finger ring finger and first webspace left hand is desires having this operated on.  He is aware of alternative treatments including aponeurotomy fasciotomy Xiaflex injection.  He has elected fasciectomy.  Pre-.  Postoperative course been discussed along with risk and complications.  He is aware that there is no guarantee to the surgery the possibility of infection recurrence injury to arteries nerves tendons complete relief symptoms and dystrophy.  Preoperative.  Patient seen extremity marked by both patient and surgeon antibiotic given.  A supraclavicular block was carried out without difficulty under the direction anesthesia department.  OPERATIVE COURSE: Patient is brought to the operating room placed in the supine position with the left arm free a timeout was taken following a prep with ChloraPrep and a  3-minute dry time.  This confirm patient procedure.  The limb was exsanguinated with an Esmarch bandage turn placed on the arm was inflated to 250 mmHg.  The first webspace was attended to first.  A multi limb type incision was made in the webspace carried down through subcutaneous tissue.  The cord was immediately apparent.  The neurovascular bundle to the index finger was identified.  The incision was ulnar to the ulnar digital nerve on the thumb.  This carried down through subcutaneous tissue over allowing visualization of the cord which was isolated and removed in toto.  The beads were converted to wise the wound was irrigated and closed interrupted 4 nylon sutures.  The ring finger was attended to next.  A volar Bruner incision was made from mid palm distally in a proximal to distal direction after identification of the neurovascular bundles proximally.  The cord was isolated and transected at the attachment to the level of the distal flexor retinaculum.  The cord was then traced distally to protecting the neurovascular bundles both radially and ulnarly lateral digital sheath were present on either radial and ulnar side.  The nerve artery on each side was infected and the dissection carried past the PIP joint which allowed removal of the central cord lateral sheath on both radial and ulnar aspect.  The PIP joint MP joint came fully straight.  The wound was copiously irrigated with saline.  The small finger was attended to next.  A volar Bruner type incision was made slightly ulnar and that this was primarily a abductor digiti quinti cord.  The ulnar neurovascular bundle was identified and  protected.  The cord was identified at the abductor digiti quinti.  The dissection was carried distally beyond the PIP joint in a [place] manner after identification of the radial digital artery and nerve which was protected the dissection was carried distally and the lateral digital sheet cord from the abductor digiti quinti  was isolated and removed in toto.  Each of the neurovascular bundles was intact on inspection the wound was copiously irrigated on each finger.  Thrombin was instilled and each of the digital wounds.  A doubled over vessel loop drain was placed.  The beads were converted to wise.  The wounds were then closed with interrupted 4-0 nylon sutures.  A sterile compressive dressing was applied.  The tourniquet was deflated all fingers pink.  He was to the recovery room for observation after placement of a dorsal splint.  He will be discharged home to return to the hand center [facility] in 1 week on Tylenol ibuprofen for pain with Norco 12/01/2023 for backup  Cindee Salt, MD Electronically signed, 02/10/21

## 2021-02-10 NOTE — Transfer of Care (Signed)
Immediate Anesthesia Transfer of Care Note  Patient: Nathan Roberts  Procedure(s) Performed: FASCIECTOMY RING FINGER AND SMALL FINGER OF LEFT HAND (Left: Hand)  Patient Location: PACU  Anesthesia Type:MAC  Level of Consciousness: drowsy, patient cooperative and responds to stimulation  Airway & Oxygen Therapy: Patient Spontanous Breathing and Patient connected to face mask oxygen  Post-op Assessment: Report given to RN and Post -op Vital signs reviewed and stable  Post vital signs: Reviewed and stable  Last Vitals:  Vitals Value Taken Time  BP    Temp    Pulse    Resp    SpO2      Last Pain:  Vitals:   02/10/21 0759  TempSrc: Oral  PainSc: 2       Patients Stated Pain Goal: 5 (22/48/25 0037)  Complications: No notable events documented.

## 2021-02-10 NOTE — Progress Notes (Signed)
Assisted Dr. Howze with left, ultrasound guided, supraclavicular block. Side rails up, monitors on throughout procedure. See vital signs in flow sheet. Tolerated Procedure well. 

## 2021-02-10 NOTE — Discharge Instructions (Addendum)

## 2021-02-10 NOTE — Brief Op Note (Signed)
02/10/2021  12:29 PM  PATIENT:  Nathan Roberts  60 y.o. male  PRE-OPERATIVE DIAGNOSIS:  DUPUYTREN'S LEFT FIRST WEB, LEFT  RING FINGER, AND LEFT SMALL FINGER  POST-OPERATIVE DIAGNOSIS:  DUPUYTREN'S LEFT FIRST WEB, LEFT  RING FINGER, AND LEFT SMALL FINGER  PROCEDURE:  Procedure(s): FASCIECTOMY RING FINGER AND SMALL FINGER OF LEFT HAND (Left)  SURGEON:  Surgeon(s) and Role:    * Cindee Salt, MD - Primary    * Betha Loa, MD - Assisting  PHYSICIAN ASSISTANT:   ASSISTANTS: K Lemond Griffee,MD   ANESTHESIA:   regional and IV sedation  EBL:  26ml  BLOOD ADMINISTERED:none  DRAINS:  Vessel loop    LOCAL MEDICATIONS USED:  NONE  SPECIMEN:  Excision  DISPOSITION OF SPECIMEN:  PATHOLOGY  COUNTS:  YES  TOURNIQUET:   Total Tourniquet Time Documented: Upper Arm (Left) - 94 minutes Total: Upper Arm (Left) - 94 minutes   DICTATION: .Reubin Milan Dictation  PLAN OF CARE: Discharge to home after PACU  PATIENT DISPOSITION:  PACU - hemodynamically stable.

## 2021-02-10 NOTE — Op Note (Signed)
I assisted Surgeon(s) and Role:    * Cindee Salt, MD - Primary    Betha Loa, MD - Assisting on the Procedure(s): FASCIECTOMY RING FINGER AND SMALL FINGER OF LEFT HAND on 02/10/2021.  I provided assistance on this case as follows: retraction soft tissues, identification and protection of structures.  Electronically signed by: Betha Loa, MD Date: 02/10/2021 Time: 12:29 PM

## 2021-02-10 NOTE — Anesthesia Procedure Notes (Signed)
Anesthesia Regional Block: Supraclavicular block   Pre-Anesthetic Checklist: , timeout performed,  Correct Patient, Correct Site, Correct Laterality,  Correct Procedure, Correct Position, site marked,  Risks and benefits discussed,  Pre-op evaluation,  At surgeon's request and post-op pain management  Laterality: Left  Prep: Maximum Sterile Barrier Precautions used, chloraprep       Needles:  Injection technique: Single-shot  Needle Type: Echogenic Stimulator Needle     Needle Length: 9cm  Needle Gauge: 22     Additional Needles:   Procedures:,,,, ultrasound used (permanent image in chart),,    Narrative:  Start time: 02/10/2021 9:55 AM End time: 02/10/2021 9:58 AM Injection made incrementally with aspirations every 5 mL.  Performed by: Personally  Anesthesiologist: Kaylyn Layer, MD  Additional Notes: Risks, benefits, and alternative discussed. Patient gave consent for procedure. Patient prepped and draped in sterile fashion. Sedation administered, patient remains easily responsive to voice. Relevant anatomy identified with ultrasound guidance. Local anesthetic given in 5cc increments with no signs or symptoms of intravascular injection. No pain or paraesthesias with injection. Patient monitored throughout procedure with signs of LAST or immediate complications. Tolerated well. Ultrasound image placed in chart.  Amalia Greenhouse, MD

## 2021-02-10 NOTE — H&P (Signed)
Nathan Roberts is an 60 y.o. male.   Chief Complaint: Dupuytren's contractures bil hands HPI: Nathan Roberts Ao is a 60 year old male referred by Nathan Bowler, PA-C for consultation regarding contractures of his hands bilaterally. He is complaining of numbness and tingling in both hands. He is the contractures of multiple digits on each hand. This been going past 6 to 7 years. He has had a carpal tunnel release done in [place] in 2005 along with a fusion of his neck at [facility] the same year. He was told he has carpal tunnel syndrome on his right side. This been treated he is of [place] descent. He has lumps on his feet. He has no curvature of his penis. He has a 1st web contracture and cords to ring and small fingers left hand with loss of extension. He does not know whether siblings have similar problems. His parents did not have problems. He has been using soaks without any relief along with ibuprofen. He has no history of injury. He is prediabetic. He has a history of arthritis no history of thyroid problems orgout. Family history is positive arthritis.   Past Medical History:  Diagnosis Date   Allergy    Arthritis    Carpal tunnel syndrome    Diabetes mellitus without complication (HCC)    Esophageal varices (HCC)    GERD (gastroesophageal reflux disease)    GI bleed    Hyperlipidemia    Hypertension    Portal hypertensive gastropathy (HCC)    Pre-diabetes     Past Surgical History:  Procedure Laterality Date   abscess     rectal area   CARPAL TUNNEL RELEASE     left   CERVICAL FUSION     ESOPHAGEAL BANDING  10/07/2020   Procedure: ESOPHAGEAL BANDING;  Surgeon: Napoleon Form, MD;  Location: [facility];  Service: Endoscopy;;   ESOPHAGOGASTRODUODENOSCOPY (EGD) WITH PROPOFOL N/A 10/07/2020   Procedure: ESOPHAGOGASTRODUODENOSCOPY (EGD) WITH PROPOFOL;  Surgeon: Napoleon Form, MD;  Location: [facility];  Service: Endoscopy;  Laterality: N/A;    ESOPHAGOGASTRODUODENOSCOPY (EGD) WITH PROPOFOL N/A 01/18/2021   Procedure: ESOPHAGOGASTRODUODENOSCOPY (EGD) WITH PROPOFOL;  Surgeon: Napoleon Form, MD;  Location: [facility];  Service: Endoscopy;  Laterality: N/A;   SPINE SURGERY      Family History  Problem Relation Age of Onset   Early death Mother    Lung cancer Mother 56       lung--smoker   Atrial fibrillation Father    Heart disease Father    COPD Father    Hyperlipidemia Father    Hypertension Father    Colon cancer Neg Hx    Esophageal cancer Neg Hx    Stomach cancer Neg Hx    Rectal cancer Neg Hx    Social History:  reports that he quit smoking about 24 years ago. His smoking use included cigarettes. He has never used smokeless tobacco. He reports current alcohol use of about 9.0 standard drinks of alcohol per week. He reports that he does not use drugs.  Allergies:  Allergies  Allergen Reactions   Cephalexin Rash   Doxycycline Rash   Latex Hives and Rash    No medications prior to admission.    Results for orders placed or performed during the hospital encounter of 02/10/21 (from the past 48 hour(s))  Comprehensive metabolic panel per protocol     Status: Abnormal   Collection Time: 02/09/21  4:24 PM  Result Value Ref Range   Sodium 137 135 -  145 mmol/L   Potassium 4.0 3.5 - 5.1 mmol/L   Chloride 103 98 - 111 mmol/L   CO2 28 22 - 32 mmol/L   Glucose, Bld 104 (H) 70 - 99 mg/dL    Comment: Glucose reference range applies only to samples taken after fasting for at least 8 hours.   BUN 8 8 - 23 mg/dL   Creatinine, Ser 4.12 (L) 0.61 - 1.24 mg/dL   Calcium 8.8 (L) 8.9 - 10.3 mg/dL   Total Protein 7.6 6.5 - 8.1 g/dL   Albumin 3.3 (L) 3.5 - 5.0 g/dL   AST 51 (H) 15 - 41 U/L   ALT 27 0 - 44 U/L   Alkaline Phosphatase 167 (H) 38 - 126 U/L   Total Bilirubin 1.1 0.3 - 1.2 mg/dL   GFR, Estimated >87 >86 mL/min    Comment: (NOTE) Calculated using the CKD-EPI Creatinine Equation (2021)    Anion gap 6 5 - 15     Comment: Performed at [facility], [address], [place], [place] [address]    No results found.   Pertinent items are noted in HPI.  Height 5' 7.5" (1.715 m), weight 88.5 kg.  General appearance: alert, cooperative, and appears stated age Head: Normocephalic, without obvious abnormality Neck: no JVD Resp: clear to auscultation bilaterally Cardio: regular rate and rhythm, S1, S2 normal, no murmur, click, rub or gallop GI: soft, non-tender; bowel sounds normal; no masses,  no organomegaly Extremities:  cords with contracture ring, small and 1st web left hansd Pulses: 2+ and symmetric Skin: Skin color, texture, turgor normal. No rashes or lesions Neurologic: Grossly normal Incision/Wound: na  Assessment/Plan Plan: We have discussed possible surgical intervention with him which he would like to proceed on his left side. This is at his request.. . Preperi-and postoperative course been discussed along with risk and complications. He is aware that there is no guarantee with surgery the possibility of infection recurrence injury to arteries nerves tendons complete relief symptoms dystrophy the possibility of stiffness following the surgery  He would like to proceed with fasciectomy ring and small finger and 1st web space of left hand as an outpatient under regional anesthesia.    Cindee Salt 02/10/2021, 6:42 AM

## 2021-02-10 NOTE — Anesthesia Postprocedure Evaluation (Signed)
Anesthesia Post Note  Patient: Nathan Roberts  Procedure(s) Performed: FASCIECTOMY RING FINGER AND SMALL FINGER OF LEFT HAND (Left: Hand)     Patient location during evaluation: PACU Anesthesia Type: Regional Level of consciousness: awake and alert and oriented Pain management: pain level controlled Vital Signs Assessment: post-procedure vital signs reviewed and stable Respiratory status: spontaneous breathing, nonlabored ventilation and respiratory function stable Cardiovascular status: blood pressure returned to baseline Postop Assessment: no apparent nausea or vomiting Anesthetic complications: no   No notable events documented.  Last Vitals:  Vitals:   02/10/21 1300 02/10/21 1316  BP: 113/72 138/77  Pulse: 60 65  Resp: 12 16  Temp:  (!) 36.3 C  SpO2: 94% 95%    Last Pain:  Vitals:   02/10/21 1316  TempSrc:   PainSc: 0-No pain                 Kaylyn Layer

## 2021-02-10 NOTE — Anesthesia Procedure Notes (Signed)
Procedure Name: MAC Date/Time: 02/10/2021 10:28 AM Performed by: Glory Buff, CRNA Oxygen Delivery Method: Simple face mask

## 2021-02-15 LAB — SURGICAL PATHOLOGY

## 2021-02-18 DIAGNOSIS — M25642 Stiffness of left hand, not elsewhere classified: Secondary | ICD-10-CM | POA: Diagnosis not present

## 2021-02-18 DIAGNOSIS — M720 Palmar fascial fibromatosis [Dupuytren]: Secondary | ICD-10-CM | POA: Diagnosis not present

## 2021-03-02 DIAGNOSIS — M25642 Stiffness of left hand, not elsewhere classified: Secondary | ICD-10-CM | POA: Diagnosis not present

## 2021-03-02 DIAGNOSIS — M720 Palmar fascial fibromatosis [Dupuytren]: Secondary | ICD-10-CM | POA: Diagnosis not present

## 2021-03-09 DIAGNOSIS — M25642 Stiffness of left hand, not elsewhere classified: Secondary | ICD-10-CM | POA: Diagnosis not present

## 2021-03-09 DIAGNOSIS — M720 Palmar fascial fibromatosis [Dupuytren]: Secondary | ICD-10-CM | POA: Diagnosis not present

## 2021-03-22 ENCOUNTER — Ambulatory Visit: Payer: BC Managed Care – PPO | Admitting: Gastroenterology

## 2021-04-06 DIAGNOSIS — K920 Hematemesis: Secondary | ICD-10-CM | POA: Diagnosis not present

## 2021-04-06 DIAGNOSIS — I10 Essential (primary) hypertension: Secondary | ICD-10-CM | POA: Diagnosis not present

## 2021-04-06 DIAGNOSIS — I8511 Secondary esophageal varices with bleeding: Secondary | ICD-10-CM | POA: Diagnosis not present

## 2021-04-06 DIAGNOSIS — K7030 Alcoholic cirrhosis of liver without ascites: Secondary | ICD-10-CM | POA: Diagnosis not present

## 2021-04-06 DIAGNOSIS — R7303 Prediabetes: Secondary | ICD-10-CM | POA: Diagnosis not present

## 2021-04-08 DIAGNOSIS — H35033 Hypertensive retinopathy, bilateral: Secondary | ICD-10-CM | POA: Diagnosis not present

## 2021-04-08 DIAGNOSIS — H43812 Vitreous degeneration, left eye: Secondary | ICD-10-CM | POA: Diagnosis not present

## 2021-04-21 DIAGNOSIS — M720 Palmar fascial fibromatosis [Dupuytren]: Secondary | ICD-10-CM | POA: Diagnosis not present

## 2021-05-02 DIAGNOSIS — D649 Anemia, unspecified: Secondary | ICD-10-CM | POA: Diagnosis not present

## 2021-05-11 ENCOUNTER — Other Ambulatory Visit: Payer: Self-pay | Admitting: Orthopedic Surgery

## 2021-05-17 ENCOUNTER — Ambulatory Visit: Payer: BC Managed Care – PPO | Admitting: Gastroenterology

## 2021-06-18 ENCOUNTER — Other Ambulatory Visit: Payer: Self-pay | Admitting: Nurse Practitioner

## 2021-07-05 ENCOUNTER — Encounter (HOSPITAL_BASED_OUTPATIENT_CLINIC_OR_DEPARTMENT_OTHER)
Admission: RE | Admit: 2021-07-05 | Discharge: 2021-07-05 | Disposition: A | Discharge status: Home / Self Care | Payer: BC Managed Care – PPO | Source: Ambulatory Visit | Attending: Orthopedic Surgery | Admitting: Orthopedic Surgery

## 2021-07-05 ENCOUNTER — Encounter (HOSPITAL_BASED_OUTPATIENT_CLINIC_OR_DEPARTMENT_OTHER): Payer: Self-pay | Admitting: Orthopedic Surgery

## 2021-07-05 ENCOUNTER — Other Ambulatory Visit: Payer: Self-pay

## 2021-07-05 DIAGNOSIS — Z79899 Other long term (current) drug therapy: Secondary | ICD-10-CM | POA: Diagnosis not present

## 2021-07-05 DIAGNOSIS — I10 Essential (primary) hypertension: Secondary | ICD-10-CM | POA: Diagnosis not present

## 2021-07-05 DIAGNOSIS — K7460 Unspecified cirrhosis of liver: Secondary | ICD-10-CM | POA: Insufficient documentation

## 2021-07-05 DIAGNOSIS — K219 Gastro-esophageal reflux disease without esophagitis: Secondary | ICD-10-CM | POA: Diagnosis not present

## 2021-07-05 DIAGNOSIS — Z01812 Encounter for preprocedural laboratory examination: Secondary | ICD-10-CM | POA: Insufficient documentation

## 2021-07-05 DIAGNOSIS — M720 Palmar fascial fibromatosis [Dupuytren]: Secondary | ICD-10-CM | POA: Diagnosis not present

## 2021-07-05 DIAGNOSIS — E119 Type 2 diabetes mellitus without complications: Secondary | ICD-10-CM | POA: Diagnosis not present

## 2021-07-05 DIAGNOSIS — G5601 Carpal tunnel syndrome, right upper limb: Secondary | ICD-10-CM | POA: Diagnosis not present

## 2021-07-05 DIAGNOSIS — Z7984 Long term (current) use of oral hypoglycemic drugs: Secondary | ICD-10-CM | POA: Diagnosis not present

## 2021-07-05 DIAGNOSIS — Z87891 Personal history of nicotine dependence: Secondary | ICD-10-CM | POA: Diagnosis not present

## 2021-07-05 DIAGNOSIS — M24541 Contracture, right hand: Secondary | ICD-10-CM | POA: Diagnosis not present

## 2021-07-05 LAB — COMPREHENSIVE METABOLIC PANEL
ALT: 29 U/L (ref 0–44)
AST: 63 U/L — ABNORMAL HIGH (ref 15–41)
Albumin: 3.1 g/dL — ABNORMAL LOW (ref 3.5–5.0)
Alkaline Phosphatase: 154 U/L — ABNORMAL HIGH (ref 38–126)
Anion gap: 7 (ref 5–15)
BUN: 8 mg/dL (ref 8–23)
CO2: 25 mmol/L (ref 22–32)
Calcium: 8.8 mg/dL — ABNORMAL LOW (ref 8.9–10.3)
Chloride: 102 mmol/L (ref 98–111)
Creatinine, Ser: 0.68 mg/dL (ref 0.61–1.24)
GFR, Estimated: >60 mL/min (ref >60)
Glucose, Bld: 203 mg/dL — ABNORMAL HIGH (ref 70–99)
Potassium: 4.5 mmol/L (ref 3.5–5.1)
Sodium: 134 mmol/L — ABNORMAL LOW (ref 135–145)
Total Bilirubin: 1 mg/dL (ref 0.3–1.2)
Total Protein: 7.4 g/dL (ref 6.5–8.1)

## 2021-07-05 NOTE — Progress Notes (Signed)

## 2021-07-06 NOTE — Anesthesia Preprocedure Evaluation (Addendum)
Anesthesia Evaluation  Patient identified by MRN, date of birth, ID band Patient awake    Reviewed: Allergy & Precautions, H&P , NPO status , Patient's Chart, lab work & pertinent test results  History of Anesthesia Complications Negative for: history of anesthetic complications  Airway Mallampati: II  TM Distance: >3 FB Neck ROM: Full    Dental no notable dental hx. (+) Teeth Intact, Dental Advisory Given   Pulmonary neg pulmonary ROS, former smoker,    Pulmonary exam normal breath sounds clear to auscultation       Cardiovascular hypertension, Pt. on medications negative cardio ROS Normal cardiovascular exam Rhythm:Regular Rate:Normal     Neuro/Psych  Headaches, PSYCHIATRIC DISORDERS Anxiety  Neuromuscular disease negative neurological ROS  negative psych ROS   GI/Hepatic negative GI ROS, Neg liver ROS, GERD  Medicated and Controlled,(+) Cirrhosis   Esophageal Varices    ,   Endo/Other  negative endocrine ROSdiabetes, Type 2, Oral Hypoglycemic Agents  Renal/GU negative Renal ROS  negative genitourinary   Musculoskeletal negative musculoskeletal ROS (+) Arthritis , DUPUYTREN'S LEFT FIRST WEB, LEFT RING FINGER, AND LEFT SMALL FINGER   Abdominal   Peds negative pediatric ROS (+)  Hematology negative hematology ROS (+) Blood dyscrasia, anemia ,   Anesthesia Other Findings Day of surgery medications reviewed with patient.  Reproductive/Obstetrics negative OB ROS                            Anesthesia Physical  Anesthesia Plan  ASA: 3  Anesthesia Plan: Regional and MAC   Post-op Pain Management:    Induction: Intravenous  PONV Risk Score and Plan: 1 and Treatment may vary due to age or medical condition, Propofol infusion and Midazolam  Airway Management Planned: Natural Airway, Simple Face Mask and Nasal Cannula  Additional Equipment: None  Intra-op Plan:   Post-operative  Plan:   Informed Consent: I have reviewed the patients History and Physical, chart, labs and discussed the procedure including the risks, benefits and alternatives for the proposed anesthesia with the patient or authorized representative who has indicated his/her understanding and acceptance.       Plan Discussed with: CRNA and Anesthesiologist  Anesthesia Plan Comments:        Anesthesia Quick Evaluation

## 2021-07-07 ENCOUNTER — Other Ambulatory Visit: Payer: Self-pay

## 2021-07-07 ENCOUNTER — Ambulatory Visit (HOSPITAL_BASED_OUTPATIENT_CLINIC_OR_DEPARTMENT_OTHER): Payer: BC Managed Care – PPO | Admitting: Anesthesiology

## 2021-07-07 ENCOUNTER — Encounter (HOSPITAL_BASED_OUTPATIENT_CLINIC_OR_DEPARTMENT_OTHER): Payer: Self-pay | Admitting: Orthopedic Surgery

## 2021-07-07 ENCOUNTER — Ambulatory Visit (HOSPITAL_BASED_OUTPATIENT_CLINIC_OR_DEPARTMENT_OTHER)
Admission: RE | Admit: 2021-07-07 | Discharge: 2021-07-07 | Disposition: A | Discharge status: Home / Self Care | Payer: BC Managed Care – PPO | Source: Ambulatory Visit | Attending: Orthopedic Surgery | Admitting: Orthopedic Surgery

## 2021-07-07 ENCOUNTER — Encounter (HOSPITAL_BASED_OUTPATIENT_CLINIC_OR_DEPARTMENT_OTHER)
Admission: RE | Disposition: A | Discharge status: Home / Self Care | Payer: Self-pay | Source: Ambulatory Visit | Attending: Orthopedic Surgery

## 2021-07-07 DIAGNOSIS — I10 Essential (primary) hypertension: Secondary | ICD-10-CM | POA: Diagnosis not present

## 2021-07-07 DIAGNOSIS — E119 Type 2 diabetes mellitus without complications: Secondary | ICD-10-CM | POA: Diagnosis not present

## 2021-07-07 DIAGNOSIS — M24541 Contracture, right hand: Secondary | ICD-10-CM | POA: Diagnosis not present

## 2021-07-07 DIAGNOSIS — R7303 Prediabetes: Secondary | ICD-10-CM | POA: Diagnosis not present

## 2021-07-07 DIAGNOSIS — Z79899 Other long term (current) drug therapy: Secondary | ICD-10-CM | POA: Insufficient documentation

## 2021-07-07 DIAGNOSIS — K219 Gastro-esophageal reflux disease without esophagitis: Secondary | ICD-10-CM | POA: Insufficient documentation

## 2021-07-07 DIAGNOSIS — M720 Palmar fascial fibromatosis [Dupuytren]: Secondary | ICD-10-CM | POA: Diagnosis not present

## 2021-07-07 DIAGNOSIS — Z7984 Long term (current) use of oral hypoglycemic drugs: Secondary | ICD-10-CM | POA: Diagnosis not present

## 2021-07-07 DIAGNOSIS — F1090 Alcohol use, unspecified, uncomplicated: Secondary | ICD-10-CM

## 2021-07-07 DIAGNOSIS — G5601 Carpal tunnel syndrome, right upper limb: Secondary | ICD-10-CM | POA: Insufficient documentation

## 2021-07-07 DIAGNOSIS — M721 Knuckle pads: Secondary | ICD-10-CM | POA: Diagnosis not present

## 2021-07-07 DIAGNOSIS — Z87891 Personal history of nicotine dependence: Secondary | ICD-10-CM | POA: Diagnosis not present

## 2021-07-07 DIAGNOSIS — M722 Plantar fascial fibromatosis: Secondary | ICD-10-CM | POA: Diagnosis not present

## 2021-07-07 DIAGNOSIS — Z789 Other specified health status: Secondary | ICD-10-CM

## 2021-07-07 HISTORY — PX: FASCIECTOMY: SHX6525

## 2021-07-07 HISTORY — PX: CARPAL TUNNEL RELEASE: SHX101

## 2021-07-07 LAB — GLUCOSE, CAPILLARY
Glucose-Capillary: 143 mg/dL — ABNORMAL HIGH (ref 70–99)
Glucose-Capillary: 134 mg/dL — ABNORMAL HIGH (ref 70–99)

## 2021-07-07 SURGERY — FASCIECTOMY, PALM
Anesthesia: Monitor Anesthesia Care | Site: Wrist | Laterality: Right

## 2021-07-07 MED ORDER — ONDANSETRON HCL 4 MG/2ML IJ SOLN
INTRAMUSCULAR | Status: DC | PRN
  Administered 2021-07-07: 4 mg via INTRAVENOUS

## 2021-07-07 MED ORDER — PROPOFOL 500 MG/50ML IV EMUL
INTRAVENOUS | Status: DC | PRN
Start: 2021-07-07 — End: 2021-07-07
  Administered 2021-07-07: 100 ug/kg/min via INTRAVENOUS

## 2021-07-07 MED ORDER — CLINDAMYCIN PHOSPHATE 900 MG/50ML IV SOLN
INTRAVENOUS | Status: AC
  Filled 2021-07-07: qty 50

## 2021-07-07 MED ORDER — FENTANYL CITRATE (PF) 100 MCG/2ML IJ SOLN: 25.0000 ug | INTRAMUSCULAR | Status: DC | PRN

## 2021-07-07 MED ORDER — ACETAMINOPHEN 325 MG PO TABS: 325.0000 mg | ORAL_TABLET | ORAL | Status: DC | PRN

## 2021-07-07 MED ORDER — 0.9 % SODIUM CHLORIDE (POUR BTL) OPTIME
TOPICAL | Status: DC | PRN
  Administered 2021-07-07: 60 mL

## 2021-07-07 MED ORDER — OXYCODONE HCL 5 MG/5ML PO SOLN: 5.0000 mg | Freq: Once | ORAL | Status: DC | PRN

## 2021-07-07 MED ORDER — ACETAMINOPHEN 160 MG/5ML PO SOLN: 325.0000 mg | ORAL | Status: DC | PRN

## 2021-07-07 MED ORDER — FENTANYL CITRATE (PF) 100 MCG/2ML IJ SOLN
INTRAMUSCULAR | Status: AC
  Filled 2021-07-07: qty 2

## 2021-07-07 MED ORDER — PROPOFOL 500 MG/50ML IV EMUL
INTRAVENOUS | Status: AC
  Filled 2021-07-07: qty 50

## 2021-07-07 MED ORDER — LACTATED RINGERS IV SOLN: INTRAVENOUS | Status: DC

## 2021-07-07 MED ORDER — CLINDAMYCIN PHOSPHATE 900 MG/50ML IV SOLN
900.0000 mg | INTRAVENOUS | Status: AC
  Administered 2021-07-07: 900 mg via INTRAVENOUS

## 2021-07-07 MED ORDER — BUPIVACAINE HCL (PF) 0.25 % IJ SOLN
INTRAMUSCULAR | Status: AC
  Filled 2021-07-07: qty 90

## 2021-07-07 MED ORDER — ONDANSETRON HCL 4 MG/2ML IJ SOLN: 4.0000 mg | Freq: Once | INTRAMUSCULAR | Status: DC | PRN

## 2021-07-07 MED ORDER — ONDANSETRON HCL 4 MG/2ML IJ SOLN
INTRAMUSCULAR | Status: AC
  Filled 2021-07-07: qty 2

## 2021-07-07 MED ORDER — MEPERIDINE HCL 25 MG/ML IJ SOLN: 6.2500 mg | INTRAMUSCULAR | Status: DC | PRN

## 2021-07-07 MED ORDER — PROPOFOL 10 MG/ML IV BOLUS
INTRAVENOUS | Status: DC | PRN
  Administered 2021-07-07: 50 mg via INTRAVENOUS
  Administered 2021-07-07: 20 mg via INTRAVENOUS
  Administered 2021-07-07: 30 mg via INTRAVENOUS

## 2021-07-07 MED ORDER — THROMBIN 5000 UNITS EX SOLR
CUTANEOUS | Status: DC | PRN
  Administered 2021-07-07: 5000 [IU] via TOPICAL

## 2021-07-07 MED ORDER — FENTANYL CITRATE (PF) 100 MCG/2ML IJ SOLN
100.0000 ug | Freq: Once | INTRAMUSCULAR | Status: AC
  Administered 2021-07-07: 100 ug via INTRAVENOUS

## 2021-07-07 MED ORDER — LIDOCAINE 2% (20 MG/ML) 5 ML SYRINGE
INTRAMUSCULAR | Status: DC | PRN
  Administered 2021-07-07: 40 mg via INTRAVENOUS

## 2021-07-07 MED ORDER — ROPIVACAINE HCL 5 MG/ML IJ SOLN
INTRAMUSCULAR | Status: DC | PRN
  Administered 2021-07-07: 25 mL via PERINEURAL

## 2021-07-07 MED ORDER — THROMBIN 5000 UNITS EX SOLR
CUTANEOUS | Status: AC
  Filled 2021-07-07: qty 5000

## 2021-07-07 MED ORDER — MIDAZOLAM HCL 2 MG/2ML IJ SOLN
INTRAMUSCULAR | Status: AC
  Filled 2021-07-07: qty 2

## 2021-07-07 MED ORDER — HYDROCODONE-ACETAMINOPHEN 5-325 MG PO TABS: 1.0000 | ORAL_TABLET | Freq: Four times a day (QID) | ORAL | 0 refills | Status: DC | PRN

## 2021-07-07 MED ORDER — MIDAZOLAM HCL 2 MG/2ML IJ SOLN
2.0000 mg | Freq: Once | INTRAMUSCULAR | Status: AC
  Administered 2021-07-07: 2 mg via INTRAVENOUS

## 2021-07-07 MED ORDER — OXYCODONE HCL 5 MG PO TABS: 5.0000 mg | ORAL_TABLET | Freq: Once | ORAL | Status: DC | PRN

## 2021-07-07 SURGICAL SUPPLY — 41 items
APL PRP STRL LF DISP 70% ISPRP (MISCELLANEOUS) ×2
BLADE MINI RND TIP GREEN BEAV (BLADE) ×3 IMPLANT
BLADE SURG 15 STRL LF DISP TIS (BLADE) ×4 IMPLANT
BLADE SURG 15 STRL SS (BLADE) ×6
BNDG CMPR 9X4 STRL LF SNTH (GAUZE/BANDAGES/DRESSINGS) ×2
BNDG COHESIVE 3X5 TAN ST LF (GAUZE/BANDAGES/DRESSINGS) ×3 IMPLANT
BNDG ESMARK 4X9 LF (GAUZE/BANDAGES/DRESSINGS) ×3 IMPLANT
BNDG GAUZE ELAST 4 BULKY (GAUZE/BANDAGES/DRESSINGS) ×3 IMPLANT
CHLORAPREP W/TINT 26 (MISCELLANEOUS) ×3 IMPLANT
CORD BIPOLAR FORCEPS 12FT (ELECTRODE) ×3 IMPLANT
COVER BACK TABLE 60X90IN (DRAPES) ×3 IMPLANT
COVER MAYO STAND STRL (DRAPES) ×3 IMPLANT
DRAPE EXTREMITY T 121X128X90 (DISPOSABLE) ×3 IMPLANT
DRAPE U-SHAPE 47X51 STRL (DRAPES) ×3 IMPLANT
DRSG PAD ABDOMINAL 8X10 ST (GAUZE/BANDAGES/DRESSINGS) ×6 IMPLANT
GAUZE SPONGE 4X4 12PLY STRL (GAUZE/BANDAGES/DRESSINGS) ×3 IMPLANT
GAUZE XEROFORM 1X8 LF (GAUZE/BANDAGES/DRESSINGS) ×3 IMPLANT
GLOVE SRG 8 PF TXTR STRL LF DI (GLOVE) ×2 IMPLANT
GLOVE SURG POLYISO LF SZ6.5 (GLOVE) ×3 IMPLANT
GLOVE SURG POLYISO LF SZ7.5 (GLOVE) ×3 IMPLANT
GLOVE SURG POLYISO LF SZ8 (GLOVE) ×3 IMPLANT
GLOVE SURG UNDER POLY LF SZ7 (GLOVE) ×3 IMPLANT
GLOVE SURG UNDER POLY LF SZ8 (GLOVE) ×3
GLOVE SURG UNDER POLY LF SZ8.5 (GLOVE) ×6 IMPLANT
GOWN STRL REUS W/ TWL LRG LVL3 (GOWN DISPOSABLE) ×2 IMPLANT
GOWN STRL REUS W/TWL LRG LVL3 (GOWN DISPOSABLE) ×3
GOWN STRL REUS W/TWL XL LVL3 (GOWN DISPOSABLE) ×6 IMPLANT
LOOP VESSEL MAXI BLUE (MISCELLANEOUS) ×3 IMPLANT
NS IRRIG 1000ML POUR BTL (IV SOLUTION) ×3 IMPLANT
PACK BASIN DAY SURGERY FS (CUSTOM PROCEDURE TRAY) ×3 IMPLANT
PAD CAST 3X4 CTTN HI CHSV (CAST SUPPLIES) ×2 IMPLANT
PADDING CAST COTTON 3X4 STRL (CAST SUPPLIES) ×3
SLEEVE SCD COMPRESS KNEE MED (STOCKING) ×3 IMPLANT
SLING ARM FOAM STRAP LRG (SOFTGOODS) ×3 IMPLANT
SPLINT PLASTER CAST XFAST 3X15 (CAST SUPPLIES) IMPLANT
SPLINT PLASTER XTRA FASTSET 3X (CAST SUPPLIES)
STOCKINETTE 4X48 STRL (DRAPES) ×3 IMPLANT
SUT ETHILON 4 0 PS 2 18 (SUTURE) ×15 IMPLANT
SYR BULB EAR ULCER 3OZ GRN STR (SYRINGE) ×3 IMPLANT
TOWEL GREEN STERILE FF (TOWEL DISPOSABLE) ×6 IMPLANT
UNDERPAD 30X36 HEAVY ABSORB (UNDERPADS AND DIAPERS) ×3 IMPLANT

## 2021-07-07 NOTE — Transfer of Care (Signed)
Immediate Anesthesia Transfer of Care Note  Patient: Nathan Roberts  Procedure(s) Performed: FASCIECTOMY RIGHT 1ST WEB, RIGHT RING FINGER, RIGHT SMALL FINGER (Right: Finger) RIGHT CARPAL TUNNEL RELEASE (Right: Wrist)  Patient Location: PACU  Anesthesia Type:MAC combined with regional for post-op pain  Level of Consciousness: sedated  Airway & Oxygen Therapy: Patient Spontanous Breathing and Patient connected to face mask oxygen  Post-op Assessment: Report given to RN and Post -op Vital signs reviewed and stable  Post vital signs: Reviewed and stable  Last Vitals:  Vitals Value Taken Time  BP 147/72 07/07/21 1100  Temp    Pulse 65 07/07/21 1102  Resp 18 07/07/21 1102  SpO2 100 % 07/07/21 1102  Vitals shown include unvalidated device data.  Last Pain:  Vitals:   07/07/21 0715  TempSrc: Oral  PainSc: 0-No pain         Complications: No notable events documented.

## 2021-07-07 NOTE — Brief Op Note (Signed)
07/07/2021  11:02 AM  PATIENT:  Nathan Roberts  60 y.o. male  PRE-OPERATIVE DIAGNOSIS:  DUPUYTREN'S RIGHT HAND, RIGHT CARPAL TUNNEL SYNDROME  POST-OPERATIVE DIAGNOSIS:  DUPUYTREN'S RIGHT HAND, RIGHT CARPAL TUNNEL SYNDROME  PROCEDURE:  Procedure(s): FASCIECTOMY RIGHT 1ST WEB, RIGHT RING FINGER, RIGHT SMALL FINGER (Right) RIGHT CARPAL TUNNEL RELEASE (Right)  SURGEON:  Surgeon(s) and Role:    * Cindee Salt, MD - Primary    * Betha Loa, MD - Assisting  PHYSICIAN ASSISTANT:   ASSISTANTS: K Anjolie Majer,MD   ANESTHESIA:   regional and IV sedation  EBL:  49ml   BLOOD ADMINISTERED:none  DRAINS: none   LOCAL MEDICATIONS USED:  NONE  SPECIMEN:  Excision  DISPOSITION OF SPECIMEN:  PATHOLOGY  COUNTS:  YES  TOURNIQUET:   Total Tourniquet Time Documented: Upper Arm (Right) - 102 minutes Total: Upper Arm (Right) - 102 minutes   DICTATION: .Reubin Milan Dictation  PLAN OF CARE: Discharge to home after PACU  PATIENT DISPOSITION:  PACU - hemodynamically stable.

## 2021-07-07 NOTE — Op Note (Signed)
I assisted Surgeon(s) and Role:    * Cindee Salt, MD - Primary    * Betha Loa, MD - Assisting on the Procedure(s): FASCIECTOMY RIGHT 1ST WEB, RIGHT RING FINGER, RIGHT SMALL FINGER RIGHT CARPAL TUNNEL RELEASE on 07/07/2021.  I provided assistance on this case as follows: retraction soft tissues, identification of structures.  Electronically signed by: Betha Loa, MD Date: 07/07/2021 Time: 10:55 AM

## 2021-07-07 NOTE — Op Note (Signed)
NAME: Nathan Roberts MEDICAL RECORD NO: [id number] DATE OF BIRTH: 11/29/1960 FACILITY: [facility] LOCATION: [facility] PHYSICIAN: Nicki Reaper, MD   OPERATIVE REPORT   DATE OF PROCEDURE: 07/07/21    PREOPERATIVE DIAGNOSIS: Carpal tunnel syndrome right hand Dupuytren's contracture ring and small finger and first webspace contracture right hand   POSTOPERATIVE DIAGNOSIS: Same   PROCEDURE: Carpal tunnel release with release of webspace contracture with V-Y advancement's and fasciectomy of palmar fascia of the ring and small fingers right hand   SURGEON: Cindee Salt, M.D.   ASSISTANT: Betha Loa, MD   ANESTHESIA:  Regional with sedation   INTRAVENOUS FLUIDS:  Per anesthesia flow sheet.   ESTIMATED BLOOD LOSS:  Minimal.   COMPLICATIONS:  None.   SPECIMENS: Palmar fascia   TOURNIQUET TIME:    Total Tourniquet Time Documented: Upper Arm (Right) - 102 minutes Total: Upper Arm (Right) - 102 minutes    DISPOSITION:  Stable to PACU.   INDICATIONS: Patient is a 60 year old male with a history of Dupuytren's contracture to the ring and little finger of his right hand first webspace and carpal tunnel syndrome nerve conductions positive he has undergone release of the carpal canal and fasciectomy on his opposite side.  He is admitted now for fasciectomy and carpal tunnel release right hand. 3.  Postoperative course been discussed along with risks and complications.  He is aware that there is no guarantee to the surgery the possibility of infection recurrence injury to arteries nerves tendons incomplete relief symptoms and dystrophy the possibility of loss of finger.  Probability of recurrence.  Preoperative area a supraclavicular block was affected by the anesthesia department.  The limb was marked by patient and surgeon and antibiotic given  OPERATIVE COURSE: Patient is brought to the operating room placed in the supine position with the right arm free.  He was prepped  with ChloraPrep.  A 3-minute dry time was allowed and a timeout taken confirm patient procedure.  The limb was exsanguinated with an Esmarch bandage tourniquet placed high in the arm was inflated to 250 mmHg.  A longitudinal incision was made in the right palm carried down through subcutaneous tissue.  Bleeders were electrocauterized with bipolar.  The palmar fascia was split.  The superficial palmar arch was identified along with the flexor tendon the ring middle finger.  Retractors were placed retracting median nerve flexor tendons radially ulnar nerve ulnarly and flexor retinaculum was then released on its ulnar border.  A right angled sexy and stool retractor placed between skin and forearm fashion the fascia was released for approximately 3 cm proximal to the wrist crease under direct vision.  The canal was explored.  Area compression to the nerve is apparent.  Motor branch entered into muscle distally.  The wound was irrigated.  The webspace was attended to next.  A volar zigzag incision was made in the webspace from the index finger carried down through subcutaneous tissue the cord was immediately apparent.  This was dissected free after identifying the neurovascular bundle to the index finger radial side transected on its index finger side and then followed to the radial side to the thumb and transected.  This allowed the webspace to open up.  The wound was copiously irrigated with saline.  The small finger cord was attended to next.  This was found proximally and the neurovascular bundles isolated.  It was a separate incision from the carpal tunnel release.  The proximal aspect of the fascia was then  released proximally for the small  and ring ring fingers.  A volar Bruner incision was made distally taking care to protect neurovascular structures which were identified and protected.  A large central cord followed by a natatory cord in the central cord going to the middle finger was identified with an  abductor digiti quinti cord.  Blunt sharp dissection this was dissected free and had several areas where it was not imminently attached to the skin and partial partial excision of the skin occurred.  Neurovascular bundles radially and ulnarly were identified protected throughout the procedure and a large cord was then removed to the level of the middle phalanx.  This cord had a significant bulk in the central aspect to it.  Ultimately a centimeter and half to 2 cm in diameter.  A separate Bruner incision was then made on the ring finger.  Natatory cord was dissected over to the ring finger protecting the neurovascular bundle radially and ulnarly.  This cord was followed centrally to the PIP joint.  Under removal of the 2 cords each finger came fully straight at both MP and PIP joints.  Neurovascular bundles were then identified radially and ulnarly on each digit and found to be intact over their entire course.  The wounds were Copiously irrigated with saline the V's were contorted converted to Y's.  Thrombin was then instilled in each of the wounds.  Vessel loop drains doubled over were placed to each of the fingers.  The wounds were then closed with interrupted 4-0 nylon sutures.  A compressive dressing was applied.  The tourniquet was deflated and all fingers immediately pink.  The specimen was sent to pathology.  Patient was taken to the recovery room for observation in satisfactory condition.  He will be discharged home to return to the hand center of [facility] in 1 week on Tylenol ibuprofen for pain with Norco for breakthrough.  Cindee Salt, MD Electronically signed, 07/07/21

## 2021-07-07 NOTE — H&P (Signed)
Nathan Roberts is an 60 y.o. male.   Chief Complaint: numbness and contractures right hand HPI: Nathan Roberts is a 59 year old male referred by Nathan Bowler, PA-C for consultation regarding contractures of his hands bilaterally. He is complaining of numbness and tingling in both hands. He is the contractures of multiple digits on each hand. This been going past 6 to 7 years. He has had a carpal tunnel release done in [place] in 2005 along with a fusion of his neck at [facility] the same year. He was told he has carpal tunnel syndrome on his right side. This been treated he is of [place] descent. He has lumps on his feet. He has no curvature of his penis. He does not know whether siblings have similar problems. His parents did not have problems. He has been using soaks without any relief along with ibuprofen. He has no history of injury. He is prediabetic. He has a history of arthritis no history of thyroid problems or gout. Family history is positive arthritis. He was sent for nerve conductions which been done by Dr. Allena Katz revealing a right carpal tunnel syndrome.  Past Medical History:  Diagnosis Date   Allergy    Arthritis    Carpal tunnel syndrome    Diabetes mellitus without complication (HCC)    Esophageal varices (HCC)    GERD (gastroesophageal reflux disease)    GI bleed    Hyperlipidemia    Hypertension    Portal hypertensive gastropathy (HCC)    Pre-diabetes     Past Surgical History:  Procedure Laterality Date   abscess     rectal area   CARPAL TUNNEL RELEASE     left   CERVICAL FUSION     ESOPHAGEAL BANDING  10/07/2020   Procedure: ESOPHAGEAL BANDING;  Surgeon: Napoleon Form, MD;  Location: [facility];  Service: Endoscopy;;   ESOPHAGOGASTRODUODENOSCOPY (EGD) WITH PROPOFOL N/A 10/07/2020   Procedure: ESOPHAGOGASTRODUODENOSCOPY (EGD) WITH PROPOFOL;  Surgeon: Napoleon Form, MD;  Location: [facility];  Service: Endoscopy;  Laterality: N/A;    ESOPHAGOGASTRODUODENOSCOPY (EGD) WITH PROPOFOL N/A 01/18/2021   Procedure: ESOPHAGOGASTRODUODENOSCOPY (EGD) WITH PROPOFOL;  Surgeon: Napoleon Form, MD;  Location: [facility];  Service: Endoscopy;  Laterality: N/A;   FASCIECTOMY Left 02/10/2021   Procedure: FASCIECTOMY RING FINGER AND SMALL FINGER OF LEFT HAND;  Surgeon: Nathan Salt, MD;  Location: [facility];  Service: Orthopedics;  Laterality: Left;   SPINE SURGERY      Family History  Problem Relation Age of Onset   Early death Mother    Lung cancer Mother 59       lung--smoker   Atrial fibrillation Father    Heart disease Father    COPD Father    Hyperlipidemia Father    Hypertension Father    Colon cancer Neg Hx    Esophageal cancer Neg Hx    Stomach cancer Neg Hx    Rectal cancer Neg Hx    Social History:  reports that he quit smoking about 24 years ago. His smoking use included cigarettes. He has never used smokeless tobacco. He reports current alcohol use of about 9.0 standard drinks per week. He reports that he does not use drugs.  Allergies:  Allergies  Allergen Reactions   Cephalexin Rash   Doxycycline Rash   Latex Hives and Rash    No medications prior to admission.    Results for orders placed or performed during the hospital encounter of 07/07/21 (from the past 48  hour(s))  Comprehensive metabolic panel per protocol     Status: Abnormal   Collection Time: 07/05/21  2:03 PM  Result Value Ref Range   Sodium 134 (L) 135 - 145 mmol/L   Potassium 4.5 3.5 - 5.1 mmol/L   Chloride 102 98 - 111 mmol/L   CO2 25 22 - 32 mmol/L   Glucose, Bld 203 (H) 70 - 99 mg/dL    Comment: Glucose reference range applies only to samples taken after fasting for at least 8 hours.   BUN 8 8 - 23 mg/dL   Creatinine, Ser 9.60 0.61 - 1.24 mg/dL   Calcium 8.8 (L) 8.9 - 10.3 mg/dL   Total Protein 7.4 6.5 - 8.1 g/dL   Albumin 3.1 (L) 3.5 - 5.0 g/dL   AST 63 (H) 15 - 41 U/L   ALT 29 0 - 44 U/L   Alkaline Phosphatase  154 (H) 38 - 126 U/L   Total Bilirubin 1.0 0.3 - 1.2 mg/dL   GFR, Estimated >45 >40 mL/min    Comment: (NOTE) Calculated using the CKD-EPI Creatinine Equation (2021)    Anion gap 7 5 - 15    Comment: Performed at [facility], [address] [place], [place] [address]    No results found.   Pertinent items are noted in HPI.  Height 5' 7.5" (1.715 m), weight 93 kg.  General appearance: alert, cooperative, and appears stated age Head: Normocephalic, without obvious abnormality Neck: no JVD Resp: clear to auscultation bilaterally Cardio: regular rate and rhythm, S1, S2 normal, no murmur, click, rub or gallop GI: soft, non-tender; bowel sounds normal; no masses,  no organomegaly Extremities: numbness and contractures right hand ring and small finger Pulses: 2+ and symmetric Skin: Skin color, texture, turgor normal. No rashes or lesions Neurologic: Grossly normal Incision/Wound: na  Assessment/Plan   Diagnosis Dupuytren's cords with contractures webspace ring and small fingers bilaterally carpal tunnel syndrome right Plan:he is desirous proceeding to have the right side done. He has carpal tunnel syndrome on that side and we would recommend carpal tunnel Along with release of the first webspace ring and small fingers with fasciectomy's right side. This can be done as an outpatient under regional anesthesia is at his request. He would like to return to work on Monday. He feels he can do his regular job. He again is aware that there is no guarantee to the surgery the possibility of infection recurrence injury to arteries nerves tendons complete relief symptoms dystrophy. Possibility of loss of the finger. Nathan Roberts 07/07/2021, 4:48 AM

## 2021-07-07 NOTE — Progress Notes (Signed)
Assisted Dr. Oddono with right, ultrasound guided, axillary block. Side rails up, monitors on throughout procedure. See vital signs in flow sheet. Tolerated Procedure well. 

## 2021-07-07 NOTE — Anesthesia Postprocedure Evaluation (Signed)
Anesthesia Post Note  Patient: Nathan Roberts  Procedure(s) Performed: FASCIECTOMY RIGHT 1ST WEB, RIGHT RING FINGER, RIGHT SMALL FINGER (Right: Finger) RIGHT CARPAL TUNNEL RELEASE (Right: Wrist)     Patient location during evaluation: PACU Anesthesia Type: Regional and MAC Level of consciousness: awake and alert Pain management: pain level controlled Vital Signs Assessment: post-procedure vital signs reviewed and stable Respiratory status: spontaneous breathing, nonlabored ventilation, respiratory function stable and patient connected to nasal cannula oxygen Cardiovascular status: stable and blood pressure returned to baseline Postop Assessment: no apparent nausea or vomiting Anesthetic complications: no   No notable events documented.  Last Vitals:  Vitals:   07/07/21 1130 07/07/21 1134  BP: (!) 172/74   Pulse: 61 (!) 59  Resp: 15 12  Temp:    SpO2: 97% 98%    Last Pain:  Vitals:   07/07/21 1245  TempSrc:   PainSc: 0-No pain                 Rashawn Rayman

## 2021-07-07 NOTE — Discharge Instructions (Addendum)
Hand Center Instructions °Hand Surgery ° °Wound Care: °Keep your hand elevated above the level of your heart.  Do not allow it to dangle by your side.  Keep the dressing dry and do not remove it unless your doctor advises you to do so.  He will usually change it at the time of your post-op visit.  Moving your fingers is advised to stimulate circulation but will depend on the site of your surgery.  If you have a splint applied, your doctor will advise you regarding movement. ° °Activity: °Do not drive or operate machinery today.  Rest today and then you may return to your normal activity and work as indicated by your physician. ° °Diet:  °Drink liquids today or eat a light diet.  You may resume a regular diet tomorrow.   ° °General expectations: °Pain for two to three days. °Fingers may become slightly swollen. ° °Call your doctor if any of the following occur: °Severe pain not relieved by pain medication. °Elevated temperature. °Dressing soaked with blood. °Inability to move fingers. °White or bluish color to fingers. ° ° ° ° °Post Anesthesia Home Care Instructions ° °Activity: °Get plenty of rest for the remainder of the day. A responsible individual must stay with you for 24 hours following the procedure.  °For the next 24 hours, DO NOT: °-Drive a car °-Operate machinery °-Drink alcoholic beverages °-Take any medication unless instructed by your physician °-Make any legal decisions or sign important papers. ° °Meals: °Start with liquid foods such as gelatin or soup. Progress to regular foods as tolerated. Avoid greasy, spicy, heavy foods. If nausea and/or vomiting occur, drink only clear liquids until the nausea and/or vomiting subsides. Call your physician if vomiting continues. ° °Special Instructions/Symptoms: °Your throat may feel dry or sore from the anesthesia or the breathing tube placed in your throat during surgery. If this causes discomfort, gargle with warm salt water. The discomfort should disappear  within 24 hours. ° °If you had a scopolamine patch placed behind your ear for the management of post- operative nausea and/or vomiting: ° °1. The medication in the patch is effective for 72 hours, after which it should be removed.  Wrap patch in a tissue and discard in the trash. Wash hands thoroughly with soap and water. °2. You may remove the patch earlier than 72 hours if you experience unpleasant side effects which may include dry mouth, dizziness or visual disturbances. °3. Avoid touching the patch. Wash your hands with soap and water after contact with the patch. °   °Regional Anesthesia Blocks ° °1. Numbness or the inability to move the "blocked" extremity may last from 3-48 hours after placement. The length of time depends on the medication injected and your individual response to the medication. If the numbness is not going away after 48 hours, call your surgeon. ° °2. The extremity that is blocked will need to be protected until the numbness is gone and the  Strength has returned. Because you cannot feel it, you will need to take extra care to avoid injury. Because it may be weak, you may have difficulty moving it or using it. You may not know what position it is in without looking at it while the block is in effect. ° °3. For blocks in the legs and feet, returning to weight bearing and walking needs to be done carefully. You will need to wait until the numbness is entirely gone and the strength has returned. You should be able to move   your leg and foot normally before you try and bear weight or walk. You will need someone to be with you when you first try to ensure you do not fall and possibly risk injury. ° °4. Bruising and tenderness at the needle site are common side effects and will resolve in a few days. ° °5. Persistent numbness or new problems with movement should be communicated to the surgeon or the [facility] ([phone])/ [facility] ([phone]).Call your surgeon  if you experience:  ° °1.  Fever over 101.0. °2.  Inability to urinate. °3.  Nausea and/or vomiting. °4.  Extreme swelling or bruising at the surgical site. °5.  Continued bleeding from the incision. °6.  Increased pain, redness or drainage from the incision. °7.  Problems related to your pain medication. °8.  Any problems and/or concerns °

## 2021-07-07 NOTE — Anesthesia Procedure Notes (Signed)
Anesthesia Regional Block: Axillary brachial plexus block   Pre-Anesthetic Checklist: , timeout performed,  Correct Patient, Correct Site, Correct Laterality,  Correct Procedure, Correct Position, site marked,  Risks and benefits discussed,  Surgical consent,  Pre-op evaluation,  At surgeon's request and post-op pain management  Laterality: Right  Prep: chloraprep       Needles:  Injection technique: Single-shot  Needle Type: Echogenic Stimulator Needle     Needle Length: 5cm  Needle Gauge: 22     Additional Needles:   Procedures:, nerve stimulator,,, ultrasound used (permanent image in chart),,     Nerve Stimulator or Paresthesia:  Response: hand, 0.45 mA  Additional Responses:   Narrative:  Start time: 07/07/2021 7:55 AM End time: 07/07/2021 8:05 AM Injection made incrementally with aspirations every 5 mL.  Performed by: Personally  Anesthesiologist: Bethena Midget, MD  Additional Notes: Functioning IV was confirmed and monitors were applied.  A 28mm 22ga Arrow echogenic stimulator needle was used. Sterile prep and drape,hand hygiene and sterile gloves were used. Ultrasound guidance: relevant anatomy identified, needle position confirmed, local anesthetic spread visualized around nerve(s)., vascular puncture avoided.  Image printed for medical record. Negative aspiration and negative test dose prior to incremental administration of local anesthetic. The patient tolerated the procedure well.

## 2021-07-08 ENCOUNTER — Ambulatory Visit: Payer: BC Managed Care – PPO | Admitting: Gastroenterology

## 2021-07-08 ENCOUNTER — Encounter (HOSPITAL_BASED_OUTPATIENT_CLINIC_OR_DEPARTMENT_OTHER): Payer: Self-pay | Admitting: Orthopedic Surgery

## 2021-07-08 LAB — SURGICAL PATHOLOGY

## 2021-07-15 DIAGNOSIS — M79641 Pain in right hand: Secondary | ICD-10-CM | POA: Diagnosis not present

## 2021-07-15 DIAGNOSIS — G5601 Carpal tunnel syndrome, right upper limb: Secondary | ICD-10-CM | POA: Diagnosis not present

## 2021-07-15 DIAGNOSIS — M25641 Stiffness of right hand, not elsewhere classified: Secondary | ICD-10-CM | POA: Diagnosis not present

## 2021-07-15 DIAGNOSIS — M720 Palmar fascial fibromatosis [Dupuytren]: Secondary | ICD-10-CM | POA: Diagnosis not present

## 2021-08-09 DIAGNOSIS — L299 Pruritus, unspecified: Secondary | ICD-10-CM | POA: Diagnosis not present

## 2021-08-09 DIAGNOSIS — M26609 Unspecified temporomandibular joint disorder, unspecified side: Secondary | ICD-10-CM | POA: Diagnosis not present

## 2021-08-25 DIAGNOSIS — M79641 Pain in right hand: Secondary | ICD-10-CM | POA: Diagnosis not present

## 2021-08-25 DIAGNOSIS — M720 Palmar fascial fibromatosis [Dupuytren]: Secondary | ICD-10-CM | POA: Diagnosis not present

## 2021-08-25 DIAGNOSIS — G5601 Carpal tunnel syndrome, right upper limb: Secondary | ICD-10-CM | POA: Diagnosis not present

## 2021-08-25 DIAGNOSIS — M25641 Stiffness of right hand, not elsewhere classified: Secondary | ICD-10-CM | POA: Diagnosis not present

## 2021-09-26 DIAGNOSIS — M720 Palmar fascial fibromatosis [Dupuytren]: Secondary | ICD-10-CM | POA: Diagnosis not present

## 2021-09-26 DIAGNOSIS — M47816 Spondylosis without myelopathy or radiculopathy, lumbar region: Secondary | ICD-10-CM | POA: Diagnosis not present

## 2021-09-26 DIAGNOSIS — G5601 Carpal tunnel syndrome, right upper limb: Secondary | ICD-10-CM | POA: Diagnosis not present

## 2021-09-26 DIAGNOSIS — M5136 Other intervertebral disc degeneration, lumbar region: Secondary | ICD-10-CM | POA: Diagnosis not present

## 2021-10-17 DIAGNOSIS — M47816 Spondylosis without myelopathy or radiculopathy, lumbar region: Secondary | ICD-10-CM | POA: Diagnosis not present

## 2021-10-17 DIAGNOSIS — M545 Low back pain, unspecified: Secondary | ICD-10-CM | POA: Diagnosis not present

## 2021-10-17 DIAGNOSIS — G8929 Other chronic pain: Secondary | ICD-10-CM | POA: Diagnosis not present

## 2021-10-17 DIAGNOSIS — M5136 Other intervertebral disc degeneration, lumbar region: Secondary | ICD-10-CM | POA: Diagnosis not present

## 2021-10-28 DIAGNOSIS — M47816 Spondylosis without myelopathy or radiculopathy, lumbar region: Secondary | ICD-10-CM | POA: Diagnosis not present

## 2021-12-06 DIAGNOSIS — M47816 Spondylosis without myelopathy or radiculopathy, lumbar region: Secondary | ICD-10-CM | POA: Diagnosis not present

## 2021-12-13 ENCOUNTER — Other Ambulatory Visit: Payer: Self-pay | Admitting: Gastroenterology

## 2022-01-03 DIAGNOSIS — M47816 Spondylosis without myelopathy or radiculopathy, lumbar region: Secondary | ICD-10-CM | POA: Diagnosis not present

## 2022-01-13 DIAGNOSIS — M47816 Spondylosis without myelopathy or radiculopathy, lumbar region: Secondary | ICD-10-CM | POA: Diagnosis not present

## 2022-02-01 DIAGNOSIS — M47816 Spondylosis without myelopathy or radiculopathy, lumbar region: Secondary | ICD-10-CM | POA: Diagnosis not present

## 2022-02-17 DIAGNOSIS — R600 Localized edema: Secondary | ICD-10-CM | POA: Diagnosis not present

## 2022-02-17 DIAGNOSIS — R7303 Prediabetes: Secondary | ICD-10-CM | POA: Diagnosis not present

## 2022-02-17 DIAGNOSIS — I10 Essential (primary) hypertension: Secondary | ICD-10-CM | POA: Diagnosis not present

## 2022-02-17 DIAGNOSIS — K7030 Alcoholic cirrhosis of liver without ascites: Secondary | ICD-10-CM | POA: Diagnosis not present

## 2022-02-17 DIAGNOSIS — I8511 Secondary esophageal varices with bleeding: Secondary | ICD-10-CM | POA: Diagnosis not present

## 2022-02-23 ENCOUNTER — Encounter: Payer: Self-pay | Admitting: Gastroenterology

## 2022-02-23 DIAGNOSIS — R600 Localized edema: Secondary | ICD-10-CM | POA: Diagnosis not present

## 2022-02-23 DIAGNOSIS — D649 Anemia, unspecified: Secondary | ICD-10-CM | POA: Diagnosis not present

## 2022-02-23 DIAGNOSIS — I8511 Secondary esophageal varices with bleeding: Secondary | ICD-10-CM | POA: Diagnosis not present

## 2022-02-23 DIAGNOSIS — K7030 Alcoholic cirrhosis of liver without ascites: Secondary | ICD-10-CM | POA: Diagnosis not present

## 2022-02-23 DIAGNOSIS — I10 Essential (primary) hypertension: Secondary | ICD-10-CM | POA: Diagnosis not present

## 2022-02-23 DIAGNOSIS — R7303 Prediabetes: Secondary | ICD-10-CM | POA: Diagnosis not present

## 2022-03-06 DIAGNOSIS — E871 Hypo-osmolality and hyponatremia: Secondary | ICD-10-CM | POA: Diagnosis not present

## 2022-03-07 DIAGNOSIS — M47816 Spondylosis without myelopathy or radiculopathy, lumbar region: Secondary | ICD-10-CM | POA: Diagnosis not present

## 2022-03-07 DIAGNOSIS — M5136 Other intervertebral disc degeneration, lumbar region: Secondary | ICD-10-CM | POA: Diagnosis not present

## 2022-03-09 DIAGNOSIS — I8511 Secondary esophageal varices with bleeding: Secondary | ICD-10-CM | POA: Diagnosis not present

## 2022-03-09 DIAGNOSIS — K7030 Alcoholic cirrhosis of liver without ascites: Secondary | ICD-10-CM | POA: Diagnosis not present

## 2022-03-09 DIAGNOSIS — K429 Umbilical hernia without obstruction or gangrene: Secondary | ICD-10-CM | POA: Diagnosis not present

## 2022-03-09 DIAGNOSIS — R600 Localized edema: Secondary | ICD-10-CM | POA: Diagnosis not present

## 2022-03-15 DIAGNOSIS — K7030 Alcoholic cirrhosis of liver without ascites: Secondary | ICD-10-CM | POA: Diagnosis not present

## 2022-03-23 ENCOUNTER — Encounter: Payer: Self-pay | Admitting: Gastroenterology

## 2022-03-23 ENCOUNTER — Other Ambulatory Visit (INDEPENDENT_AMBULATORY_CARE_PROVIDER_SITE_OTHER): Payer: BC Managed Care – PPO

## 2022-03-23 ENCOUNTER — Ambulatory Visit: Payer: BC Managed Care – PPO | Admitting: Gastroenterology

## 2022-03-23 ENCOUNTER — Other Ambulatory Visit: Payer: Self-pay

## 2022-03-23 ENCOUNTER — Other Ambulatory Visit: Payer: BC Managed Care – PPO

## 2022-03-23 VITALS — BP 120/78 | HR 73 | Ht 69.0 in | Wt 231.4 lb

## 2022-03-23 DIAGNOSIS — Z1212 Encounter for screening for malignant neoplasm of rectum: Secondary | ICD-10-CM

## 2022-03-23 DIAGNOSIS — Z1211 Encounter for screening for malignant neoplasm of colon: Secondary | ICD-10-CM | POA: Diagnosis not present

## 2022-03-23 DIAGNOSIS — K429 Umbilical hernia without obstruction or gangrene: Secondary | ICD-10-CM

## 2022-03-23 DIAGNOSIS — R600 Localized edema: Secondary | ICD-10-CM

## 2022-03-23 DIAGNOSIS — I8500 Esophageal varices without bleeding: Secondary | ICD-10-CM | POA: Diagnosis not present

## 2022-03-23 DIAGNOSIS — K7030 Alcoholic cirrhosis of liver without ascites: Secondary | ICD-10-CM

## 2022-03-23 LAB — COMPREHENSIVE METABOLIC PANEL
ALT: 27 U/L (ref 0–53)
AST: 83 U/L — ABNORMAL HIGH (ref 0–37)
Albumin: 3.1 g/dL — ABNORMAL LOW (ref 3.5–5.2)
Alkaline Phosphatase: 212 U/L — ABNORMAL HIGH (ref 39–117)
BUN: 4 mg/dL — ABNORMAL LOW (ref 6–23)
CO2: 29 mEq/L (ref 19–32)
Calcium: 8.9 mg/dL (ref 8.4–10.5)
Chloride: 93 mEq/L — ABNORMAL LOW (ref 96–112)
Creatinine, Ser: 0.56 mg/dL (ref 0.40–1.50)
GFR: 104.95 mL/min (ref >60.00)
Glucose, Bld: 171 mg/dL — ABNORMAL HIGH (ref 70–99)
Potassium: 4.7 mEq/L (ref 3.5–5.1)
Sodium: 127 mEq/L — ABNORMAL LOW (ref 135–145)
Total Bilirubin: 2.3 mg/dL — ABNORMAL HIGH (ref 0.2–1.2)
Total Protein: 8.6 g/dL — ABNORMAL HIGH (ref 6.0–8.3)

## 2022-03-23 LAB — PROTIME-INR
INR: 1.4 ratio — ABNORMAL HIGH (ref 0.8–1.0)
Prothrombin Time: 14.9 s — ABNORMAL HIGH (ref 9.6–13.1)

## 2022-03-23 LAB — CBC WITH DIFFERENTIAL/PLATELET
Basophils Absolute: 0 10*3/uL (ref 0.0–0.1)
Basophils Relative: 0.7 % (ref 0.0–3.0)
Eosinophils Absolute: 0.2 10*3/uL (ref 0.0–0.7)
Eosinophils Relative: 4.9 % (ref 0.0–5.0)
HCT: 35.8 % — ABNORMAL LOW (ref 39.0–52.0)
Hemoglobin: 12 g/dL — ABNORMAL LOW (ref 13.0–17.0)
Lymphocytes Relative: 19.6 % (ref 12.0–46.0)
Lymphs Abs: 0.9 10*3/uL (ref 0.7–4.0)
MCHC: 33.5 g/dL (ref 30.0–36.0)
MCV: 91.5 fl (ref 78.0–100.0)
Monocytes Absolute: 1.1 10*3/uL — ABNORMAL HIGH (ref 0.1–1.0)
Monocytes Relative: 22.9 % — ABNORMAL HIGH (ref 3.0–12.0)
Neutro Abs: 2.5 10*3/uL (ref 1.4–7.7)
Neutrophils Relative %: 51.9 % (ref 43.0–77.0)
Platelets: 107 10*3/uL — ABNORMAL LOW (ref 150.0–400.0)
RBC: 3.91 Mil/uL — ABNORMAL LOW (ref 4.22–5.81)
RDW: 19.1 % — ABNORMAL HIGH (ref 11.5–15.5)
WBC: 4.7 10*3/uL (ref 4.0–10.5)

## 2022-03-23 MED ORDER — PANTOPRAZOLE SODIUM 40 MG PO TBEC: 40.0000 mg | DELAYED_RELEASE_TABLET | Freq: Two times a day (BID) | ORAL | 5 refills | Status: DC

## 2022-03-23 MED ORDER — SPIRONOLACTONE 100 MG PO TABS: 100.0000 mg | ORAL_TABLET | Freq: Every day | ORAL | 5 refills | Status: DC

## 2022-03-23 MED ORDER — NA SULFATE-K SULFATE-MG SULF 17.5-3.13-1.6 GM/177ML PO SOLN: 1.0000 | Freq: Once | ORAL | 0 refills | Status: AC

## 2022-03-23 NOTE — Progress Notes (Signed)
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  Previous serologies negative for other causes.  He has not been seen here in the office since April 2022.  He did have his repeat EGD in June 2022 as below.  He continues on propanolol 10 mg twice daily.  He is no longer on pantoprazole.  He was on Lasix 40 mg daily, but stopped that recently because he it was causing him to urinate too much.  He says that he is still drinking 12 beers a week.  He denies any nausea, vomiting, constipation or diarrhea, dark or bloody stools, or abdominal pain.  No confusion.  He does complain of abdominal hernias and thinks that he has an appointment with the surgeon in the near future.   10/07/20 EGD: Grade II esophageal varices. Completely eradicated. Banded. - Portal hypertensive gastropathy. - Normal examined duodenum. - No specimens collected.  12/2020 EGD: - Grade I and small (< 5 mm) esophageal varices. - Normal stomach. - Normal examined duodenum. - No specimens collected.   Last colonoscopy was in 2011.  He has had no abdominal imaging since 09/2020.  AFP previously was slightly elevated.  He has no other specific complaints today and was not really quite sure why he was here.  Past Medical History:  Diagnosis Date   Allergy    Arthritis    Carpal tunnel syndrome    Diabetes mellitus without complication (HCC)    Esophageal varices (HCC)    GERD (gastroesophageal reflux disease)    GI bleed    Hyperlipidemia    Hypertension    Portal hypertensive gastropathy (HCC)    Pre-diabetes    Past Surgical History:  Procedure Laterality Date   abscess     rectal area   CARPAL TUNNEL RELEASE     left   CARPAL TUNNEL RELEASE Right 07/07/2021   Procedure: RIGHT CARPAL TUNNEL RELEASE;  Surgeon: Cindee Salt, MD;  Location: [facility];  Service: Orthopedics;  Laterality: Right;   CERVICAL FUSION      ESOPHAGEAL BANDING  10/07/2020   Procedure: ESOPHAGEAL BANDING;  Surgeon: Napoleon Form, MD;  Location: [facility];  Service: Endoscopy;;   ESOPHAGOGASTRODUODENOSCOPY (EGD) WITH PROPOFOL N/A 10/07/2020   Procedure: ESOPHAGOGASTRODUODENOSCOPY (EGD) WITH PROPOFOL;  Surgeon: Napoleon Form, MD;  Location: [facility];  Service: Endoscopy;  Laterality: N/A;   ESOPHAGOGASTRODUODENOSCOPY (EGD) WITH PROPOFOL N/A 01/18/2021   Procedure: ESOPHAGOGASTRODUODENOSCOPY (EGD) WITH PROPOFOL;  Surgeon: Napoleon Form, MD;  Location: [facility];  Service: Endoscopy;  Laterality: N/A;   FASCIECTOMY Left 02/10/2021   Procedure: FASCIECTOMY RING FINGER AND SMALL FINGER OF LEFT HAND;  Surgeon: Cindee Salt, MD;  Location: [facility];  Service: Orthopedics;  Laterality: Left;   FASCIECTOMY Right 07/07/2021   Procedure: FASCIECTOMY RIGHT 1ST WEB, RIGHT RING FINGER, RIGHT SMALL FINGER;  Surgeon: Cindee Salt, MD;  Location: [facility];  Service: Orthopedics;  Laterality: Right;   SPINE SURGERY      reports that he quit smoking about 25 years ago. His smoking use included cigarettes. He has never used smokeless tobacco. He reports current alcohol use of about 9.0 standard drinks of alcohol per week. He reports that he does not use drugs. family history includes Atrial fibrillation in his father; COPD in his father; Early death in his mother; Heart disease in his father; Hyperlipidemia in his father; Hypertension in his father;  Lung cancer (age of onset: 60) in his mother. Allergies  Allergen Reactions   Cephalexin Rash   Doxycycline Rash   Latex Hives and Rash      Outpatient Encounter Medications as of 03/23/2022  Medication Sig   amLODipine (NORVASC) 10 MG tablet Take 10 mg by mouth in the morning.   dicyclomine (BENTYL) 10 MG/5ML solution Take 5 mLs (10 mg total) by mouth 4 (four) times daily -  before meals and at bedtime. May use dicyclomine caps if solution is not  available   folic acid (FOLVITE) 1 MG tablet Take 1 tablet (1 mg total) by mouth daily.   HYDROcodone-acetaminophen (NORCO) 5-325 MG tablet Take 1 tablet by mouth every 6 (six) hours as needed.   hydrOXYzine (ATARAX/VISTARIL) 25 MG tablet Take 1 tablet (25 mg total) by mouth every 6 (six) hours as needed for itching.   metFORMIN (GLUCOPHAGE) 500 MG tablet Take 500 mg by mouth daily.   mirtazapine (REMERON) 15 MG tablet Take 15 mg by mouth at bedtime.   Multiple Vitamins-Minerals (MULTIVITAMIN WITH MINERALS) tablet Take 1 tablet by mouth daily.   olmesartan (BENICAR) 20 MG tablet Take 20 mg by mouth daily.   pantoprazole (PROTONIX) 40 MG tablet Take 1 tablet (40 mg total) by mouth 2 (two) times daily before a meal.   propranolol (INDERAL) 10 MG tablet Take 2 tablets in the morning and 1 tablet in the evening. KEEP YOUR December APPOINTMENT WITH DR. NANDIGAM FOR FURTHER REFILLS   buPROPion (WELLBUTRIN SR) 150 MG 12 hr tablet Take 1 tablet by mouth 2 (two) times daily. (Patient not taking: Reported on 03/23/2022)   furosemide (LASIX) 40 MG tablet Take 40 mg by mouth daily. (Patient not taking: Reported on 03/23/2022)   No facility-administered encounter medications on file as of 03/23/2022.     REVIEW OF SYSTEMS  : All other systems reviewed and negative except where noted in the History of Present Illness.   PHYSICAL EXAM: BP 120/78   Pulse 73   Ht 5\' 9"  (1.753 m)   Wt 231 lb 6.4 oz (105 kg)   SpO2 99%   BMI 34.17 kg/m  General: Well developed white male in no acute distress Head: Normocephalic and atraumatic Eyes:  Sclerae anicteric, conjunctiva pink. Ears: Normal auditory acuity Lungs: Clear throughout to auscultation; no W/R/R. Heart: Regular rate and rhythm; no M/R/G. Abdomen: Soft, somewhat distended.  ? Ascites.  BS present.  Reducible umbilical hernia noted.  Also noted to have a ventral wall hernia/diastasis recti.  Non-tender. Rectal:  Will be done at the time of  colonoscopy. Musculoskeletal: Symmetrical with no gross deformities  Skin: No lesions on visible extremities Extremities: Bilateral lower extremity edema noted with skin changes and some erythema. Neurological: Alert oriented x 4, grossly non-focal Psychological:  Alert and cooperative. Normal mood and affect  ASSESSMENT AND PLAN: *Alcohol cirrhosis with history of esophageal varices and lower extremity edema (previous serologies negative for other causes).  He is still drinking 12 beers a week.  Is on propanolol 10 mg twice daily.  Recently discontinued his Lasix because of having to urinate so much.  I have advised him to restart the Lasix and I am going to add spironolactone 100 mg daily.  Prescription was sent to his pharmacy.  We are also going to restart his pantoprazole 40 mg twice daily for now.  History of bleeding esophageal varices in March 2022 with eradication from banding.  Repeat EGD in June 2022 was okay.  Since  he needs colonoscopy as well we will get a repeat endoscopy.  He is being scheduled at [facility].  I did do labs today including a CBC, CMP, AFP, PT/INR.  AFP was previously slightly elevated.  He has had no abdominal imaging.  We did do an MRI of the abdomen with attention to the liver.  We again discussed complete alcohol cessation. *Colorectal cancer screening: Last colonoscopy 2011.  We will schedule Dr. Lavon Paganini. *Umbilical hernia and ventral wall hernia/diastases recti: These are nontender and reducible.  He thinks that he has an appointment to see a surgeon in the near future.  I would say at this point he is probably rather high risk for surgery.  **The risks, benefits, and alternatives to EGD and colonoscopy were discussed with the patient and he consents to proceed.   **Of note, he does not have hepatitis A antibody so will need vaccinations for that.  I do not see any previously checked hep B serologies so we will need to check those and vaccinate him for  that as well if no antibodies.  CC:  Jarrett Soho, PA-C

## 2022-03-23 NOTE — Addendum Note (Signed)
Addended by: Rise Paganini on: 03/23/2022 03:23 PM   Modules accepted: Orders

## 2022-03-23 NOTE — Patient Instructions (Addendum)
If you are age 63 or older, your body mass index should be between 23-30. Your Body mass index is 34.17 kg/m. If this is out of the aforementioned range listed, please consider follow up with your Primary Care Provider.  If you are age 8 or younger, your body mass index should be between 19-25. Your Body mass index is 34.17 kg/m. If this is out of the aformentioned range listed, please consider follow up with your Primary Care Provider.   ________________________________________________________  The [facility] providers would like to encourage you to use [facility] to communicate with providers for non-urgent requests or questions.  Due to long hold times on the telephone, sending your provider a message by [facility] may be a faster and more efficient way to get a response.  Please allow 48 business hours for a response.  Please remember that this is for non-urgent requests.  _______________________________________________________   We have sent the following medications to your pharmacy for you to pick up at your convenience: Pantoprazole 40 mg twice daily & Spirolactone 100 mg daily.  You have been scheduled for an MRI at [facility], 1st floor, Radiology. Your appointment is scheduled on 03/27/22 at 8 pm. Please arrive 30 minutes prior to your appointment time for registration purposes. Please make certain not to have anything to eat or drink 4 hours prior to your test. In addition, if you have any metal in your body, have a pacemaker or defibrillator, please be sure to let your ordering physician know. This test typically takes 45 minutes to 1 hour to complete. Should you need to reschedule, please call [phone].   We will contact you regarding your EGD and colon procedure. If you do not hear from [place] in 2 weeks, give [place] a call.  Due to recent changes in healthcare laws, you may see the results of your imaging and laboratory studies on MyChart before your provider has had a chance to  review them.  We understand that in some cases there may be results that are confusing or concerning to you. Not all laboratory results come back in the same time frame and the provider may be waiting for multiple results in order to interpret others.  Please give [place] 48 hours in order for your provider to thoroughly review all the results before contacting the office for clarification of your results.    It was a pleasure to see you today!  Thank you for trusting me with your gastrointestinal care!

## 2022-03-24 ENCOUNTER — Other Ambulatory Visit: Payer: Self-pay | Admitting: Surgery

## 2022-03-24 DIAGNOSIS — K429 Umbilical hernia without obstruction or gangrene: Secondary | ICD-10-CM | POA: Diagnosis not present

## 2022-03-27 ENCOUNTER — Ambulatory Visit (HOSPITAL_COMMUNITY): Payer: BC Managed Care – PPO

## 2022-03-27 LAB — AFP TUMOR MARKER: AFP-Tumor Marker: 7.8 ng/mL — ABNORMAL HIGH (ref <6.1)

## 2022-03-30 ENCOUNTER — Telehealth: Payer: Self-pay | Admitting: Gastroenterology

## 2022-03-30 NOTE — Telephone Encounter (Signed)
I have advised the pt that Dr Lavon Paganini does not have any sooner appts at this time.  He can be added to the waitlist.  I have also made him aware that he can call at his convenience to see if we have had any cancellations.  The pt has been advised of the information and verbalized understanding.

## 2022-03-30 NOTE — Telephone Encounter (Signed)
Patient called and states is pcp needs him to have his egd done sooner than scheduled. Patient is requesting a call back to advise, thank you.

## 2022-04-01 ENCOUNTER — Ambulatory Visit (HOSPITAL_COMMUNITY)
Admission: RE | Admit: 2022-04-01 | Discharge: 2022-04-01 | Disposition: A | Discharge status: Home / Self Care | Payer: BC Managed Care – PPO | Source: Ambulatory Visit | Attending: Gastroenterology | Admitting: Gastroenterology

## 2022-04-01 DIAGNOSIS — R600 Localized edema: Secondary | ICD-10-CM | POA: Insufficient documentation

## 2022-04-01 DIAGNOSIS — K828 Other specified diseases of gallbladder: Secondary | ICD-10-CM | POA: Diagnosis not present

## 2022-04-01 DIAGNOSIS — Z1212 Encounter for screening for malignant neoplasm of rectum: Secondary | ICD-10-CM | POA: Diagnosis not present

## 2022-04-01 DIAGNOSIS — I8500 Esophageal varices without bleeding: Secondary | ICD-10-CM | POA: Insufficient documentation

## 2022-04-01 DIAGNOSIS — Z8679 Personal history of other diseases of the circulatory system: Secondary | ICD-10-CM | POA: Diagnosis not present

## 2022-04-01 DIAGNOSIS — Z1211 Encounter for screening for malignant neoplasm of colon: Secondary | ICD-10-CM | POA: Insufficient documentation

## 2022-04-01 DIAGNOSIS — K429 Umbilical hernia without obstruction or gangrene: Secondary | ICD-10-CM | POA: Insufficient documentation

## 2022-04-01 DIAGNOSIS — K5730 Diverticulosis of large intestine without perforation or abscess without bleeding: Secondary | ICD-10-CM | POA: Diagnosis not present

## 2022-04-01 DIAGNOSIS — K7030 Alcoholic cirrhosis of liver without ascites: Secondary | ICD-10-CM | POA: Diagnosis not present

## 2022-04-01 DIAGNOSIS — K7460 Unspecified cirrhosis of liver: Secondary | ICD-10-CM | POA: Diagnosis not present

## 2022-04-01 MED ORDER — GADOBUTROL 1 MMOL/ML IV SOLN
10.0000 mL | Freq: Once | INTRAVENOUS | Status: AC | PRN
Start: 2022-04-01 — End: 2022-04-01
  Administered 2022-04-01: 10 mL via INTRAVENOUS

## 2022-04-05 DIAGNOSIS — G8929 Other chronic pain: Secondary | ICD-10-CM | POA: Diagnosis not present

## 2022-04-05 DIAGNOSIS — I8511 Secondary esophageal varices with bleeding: Secondary | ICD-10-CM | POA: Diagnosis not present

## 2022-04-05 DIAGNOSIS — R600 Localized edema: Secondary | ICD-10-CM | POA: Diagnosis not present

## 2022-04-05 DIAGNOSIS — K7030 Alcoholic cirrhosis of liver without ascites: Secondary | ICD-10-CM | POA: Diagnosis not present

## 2022-04-05 DIAGNOSIS — Z6834 Body mass index (BMI) 34.0-34.9, adult: Secondary | ICD-10-CM | POA: Diagnosis not present

## 2022-04-11 ENCOUNTER — Telehealth: Payer: Self-pay | Admitting: Gastroenterology

## 2022-04-11 NOTE — Telephone Encounter (Signed)
Spoke with the patient. Advised the MRI will be reviewed by his providers. He will receive a call with any recommendations or comments.  Asked patient how he is doing. Responds he is doing "great." No concerns with his medications. Endorses LE edema. Confirmed the planned procedure date at [facility] on 05/29/22.

## 2022-04-11 NOTE — Telephone Encounter (Signed)
Patient called, states he would like to speak to you regarding MRI. Thankx

## 2022-04-28 DIAGNOSIS — K429 Umbilical hernia without obstruction or gangrene: Secondary | ICD-10-CM | POA: Diagnosis not present

## 2022-05-10 DIAGNOSIS — R9431 Abnormal electrocardiogram [ECG] [EKG]: Secondary | ICD-10-CM | POA: Diagnosis not present

## 2022-05-10 DIAGNOSIS — R0609 Other forms of dyspnea: Secondary | ICD-10-CM | POA: Diagnosis not present

## 2022-05-10 DIAGNOSIS — E785 Hyperlipidemia, unspecified: Secondary | ICD-10-CM | POA: Diagnosis not present

## 2022-05-10 DIAGNOSIS — I10 Essential (primary) hypertension: Secondary | ICD-10-CM | POA: Diagnosis not present

## 2022-05-22 ENCOUNTER — Encounter (HOSPITAL_COMMUNITY): Payer: Self-pay | Admitting: Gastroenterology

## 2022-05-22 NOTE — Progress Notes (Signed)
Attempted to obtain medical history via telephone, unable to reach at this time. HIPAA compliant voicemail message left requesting return call to pre surgical testing department. 

## 2022-05-23 DIAGNOSIS — M48061 Spinal stenosis, lumbar region without neurogenic claudication: Secondary | ICD-10-CM | POA: Diagnosis not present

## 2022-05-23 DIAGNOSIS — M5126 Other intervertebral disc displacement, lumbar region: Secondary | ICD-10-CM | POA: Diagnosis not present

## 2022-05-23 DIAGNOSIS — M47816 Spondylosis without myelopathy or radiculopathy, lumbar region: Secondary | ICD-10-CM | POA: Diagnosis not present

## 2022-05-23 DIAGNOSIS — M4807 Spinal stenosis, lumbosacral region: Secondary | ICD-10-CM | POA: Diagnosis not present

## 2022-05-28 NOTE — Anesthesia Preprocedure Evaluation (Signed)
Anesthesia Evaluation  Patient identified by MRN, date of birth, ID band Patient awake    Reviewed: Allergy & Precautions, NPO status , Patient's Chart, lab work & pertinent test results, reviewed documented beta blocker date and time   Airway Mallampati: III  TM Distance: >3 FB Neck ROM: Full    Dental  (+) Caps, Dental Advisory Given   Pulmonary former smoker,    breath sounds clear to auscultation       Cardiovascular hypertension, Pt. on medications and Pt. on home beta blockers (-) angina Rhythm:Regular Rate:Normal     Neuro/Psych  Headaches, Anxiety    GI/Hepatic GERD  Medicated and Controlled,(+) Cirrhosis   Esophageal Varices    ,   Endo/Other  diabetes (glu 91), Oral Hypoglycemic Agents  Renal/GU      Musculoskeletal  (+) Arthritis ,   Abdominal   Peds  Hematology   Anesthesia Other Findings   Reproductive/Obstetrics                            Anesthesia Physical Anesthesia Plan  ASA: 3  Anesthesia Plan: MAC   Post-op Pain Management: Minimal or no pain anticipated   Induction:   PONV Risk Score and Plan: 1 and Treatment may vary due to age or medical condition  Airway Management Planned: Natural Airway and Nasal Cannula  Additional Equipment: None  Intra-op Plan:   Post-operative Plan:   Informed Consent: I have reviewed the patients History and Physical, chart, labs and discussed the procedure including the risks, benefits and alternatives for the proposed anesthesia with the patient or authorized representative who has indicated his/her understanding and acceptance.     Dental advisory given  Plan Discussed with: CRNA and Surgeon  Anesthesia Plan Comments:        Anesthesia Quick Evaluation

## 2022-05-29 ENCOUNTER — Encounter (HOSPITAL_COMMUNITY): Payer: Self-pay | Admitting: Gastroenterology

## 2022-05-29 ENCOUNTER — Other Ambulatory Visit: Payer: Self-pay

## 2022-05-29 ENCOUNTER — Ambulatory Visit (HOSPITAL_COMMUNITY): Payer: BC Managed Care – PPO | Admitting: Anesthesiology

## 2022-05-29 ENCOUNTER — Ambulatory Visit (HOSPITAL_COMMUNITY)
Admission: RE | Admit: 2022-05-29 | Discharge: 2022-05-29 | Disposition: A | Discharge status: Home / Self Care | Payer: BC Managed Care – PPO | Source: Ambulatory Visit | Attending: Gastroenterology | Admitting: Gastroenterology

## 2022-05-29 ENCOUNTER — Encounter (HOSPITAL_COMMUNITY)
Admission: RE | Disposition: A | Discharge status: Home / Self Care | Payer: Self-pay | Source: Ambulatory Visit | Attending: Gastroenterology

## 2022-05-29 DIAGNOSIS — K449 Diaphragmatic hernia without obstruction or gangrene: Secondary | ICD-10-CM | POA: Diagnosis not present

## 2022-05-29 DIAGNOSIS — E119 Type 2 diabetes mellitus without complications: Secondary | ICD-10-CM | POA: Insufficient documentation

## 2022-05-29 DIAGNOSIS — Z7984 Long term (current) use of oral hypoglycemic drugs: Secondary | ICD-10-CM | POA: Diagnosis not present

## 2022-05-29 DIAGNOSIS — D509 Iron deficiency anemia, unspecified: Secondary | ICD-10-CM | POA: Diagnosis not present

## 2022-05-29 DIAGNOSIS — R600 Localized edema: Secondary | ICD-10-CM

## 2022-05-29 DIAGNOSIS — I8510 Secondary esophageal varices without bleeding: Secondary | ICD-10-CM | POA: Diagnosis not present

## 2022-05-29 DIAGNOSIS — Z87891 Personal history of nicotine dependence: Secondary | ICD-10-CM | POA: Insufficient documentation

## 2022-05-29 DIAGNOSIS — I8500 Esophageal varices without bleeding: Secondary | ICD-10-CM | POA: Diagnosis not present

## 2022-05-29 DIAGNOSIS — K429 Umbilical hernia without obstruction or gangrene: Secondary | ICD-10-CM

## 2022-05-29 DIAGNOSIS — I10 Essential (primary) hypertension: Secondary | ICD-10-CM | POA: Insufficient documentation

## 2022-05-29 DIAGNOSIS — D500 Iron deficiency anemia secondary to blood loss (chronic): Secondary | ICD-10-CM | POA: Diagnosis not present

## 2022-05-29 DIAGNOSIS — K7030 Alcoholic cirrhosis of liver without ascites: Secondary | ICD-10-CM

## 2022-05-29 DIAGNOSIS — K219 Gastro-esophageal reflux disease without esophagitis: Secondary | ICD-10-CM | POA: Insufficient documentation

## 2022-05-29 DIAGNOSIS — Z1211 Encounter for screening for malignant neoplasm of colon: Secondary | ICD-10-CM

## 2022-05-29 DIAGNOSIS — F419 Anxiety disorder, unspecified: Secondary | ICD-10-CM | POA: Diagnosis not present

## 2022-05-29 HISTORY — PX: ESOPHAGOGASTRODUODENOSCOPY (EGD) WITH PROPOFOL: SHX5813

## 2022-05-29 HISTORY — PX: COLONOSCOPY WITH PROPOFOL: SHX5780

## 2022-05-29 LAB — GLUCOSE, CAPILLARY: Glucose-Capillary: 91 mg/dL (ref 70–99)

## 2022-05-29 SURGERY — ESOPHAGOGASTRODUODENOSCOPY (EGD) WITH PROPOFOL
Anesthesia: Monitor Anesthesia Care

## 2022-05-29 MED ORDER — PROPOFOL 10 MG/ML IV BOLUS
INTRAVENOUS | Status: AC
  Filled 2022-05-29: qty 20

## 2022-05-29 MED ORDER — PROPOFOL 10 MG/ML IV BOLUS
INTRAVENOUS | Status: DC | PRN
  Administered 2022-05-29 (×2): 20 mg via INTRAVENOUS

## 2022-05-29 MED ORDER — LIDOCAINE 2% (20 MG/ML) 5 ML SYRINGE
INTRAMUSCULAR | Status: DC | PRN
  Administered 2022-05-29: 100 mg via INTRAVENOUS

## 2022-05-29 MED ORDER — PROPOFOL 1000 MG/100ML IV EMUL
INTRAVENOUS | Status: AC
  Filled 2022-05-29: qty 100

## 2022-05-29 MED ORDER — SODIUM CHLORIDE 0.9 % IV SOLN
INTRAVENOUS | Status: DC
  Administered 2022-05-29: 1000 mL via INTRAVENOUS

## 2022-05-29 MED ORDER — PROPRANOLOL HCL 10 MG PO TABS: ORAL_TABLET | ORAL | 3 refills | Status: DC

## 2022-05-29 MED ORDER — PROPOFOL 500 MG/50ML IV EMUL
INTRAVENOUS | Status: DC | PRN
  Administered 2022-05-29: 125 ug/kg/min via INTRAVENOUS

## 2022-05-29 SURGICAL SUPPLY — 25 items

## 2022-05-29 NOTE — Discharge Instructions (Signed)
YOU HAD AN ENDOSCOPIC PROCEDURE TODAY: Refer to the procedure report and other information in the discharge instructions given to you for any specific questions about what was found during the examination. If this information does not answer your questions, please call [facility] office at [phone] to clarify.  ° °YOU SHOULD EXPECT: Some feelings of bloating in the abdomen. Passage of more gas than usual. Walking can help get rid of the air that was put into your GI tract during the procedure and reduce the bloating. If you had a lower endoscopy (such as a colonoscopy or flexible sigmoidoscopy) you may notice spotting of blood in your stool or on the toilet paper. Some abdominal soreness may be present for a day or two, also. ° °DIET: Your first meal following the procedure should be a light meal and then it is ok to progress to your normal diet. A half-sandwich or bowl of soup is an example of a good first meal. Heavy or fried foods are harder to digest and may make you feel nauseous or bloated. Drink plenty of fluids but you should avoid alcoholic beverages for 24 hours. If you had a esophageal dilation, please see attached instructions for diet.   ° °ACTIVITY: Your care partner should take you home directly after the procedure. You should plan to take it easy, moving slowly for the rest of the day. You can resume normal activity the day after the procedure however YOU SHOULD NOT DRIVE, use power tools, machinery or perform tasks that involve climbing or major physical exertion for 24 hours (because of the sedation medicines used during the test).  ° °SYMPTOMS TO REPORT IMMEDIATELY: °A gastroenterologist can be reached at any hour. Please call [phone]  for any of the following symptoms:  °Following lower endoscopy (colonoscopy, flexible sigmoidoscopy) °Excessive amounts of blood in the stool  °Significant tenderness, worsening of abdominal pains  °Swelling of the abdomen that is new, acute  °Fever of 100° or  higher  °Following upper endoscopy (EGD, EUS, ERCP, esophageal dilation) °Vomiting of blood or coffee ground material  °New, significant abdominal pain  °New, significant chest pain or pain under the shoulder blades  °Painful or persistently difficult swallowing  °New shortness of breath  °Black, tarry-looking or red, bloody stools ° °FOLLOW UP:  °If any biopsies were taken you will be contacted by phone or by letter within the next 1-3 weeks. Call [phone]  if you have not heard about the biopsies in 3 weeks.  °Please also call with any specific questions about appointments or follow up tests. ° °

## 2022-05-29 NOTE — H&P (Addendum)
[facility] History and Physical   Primary Care Physician:  Marda Stalker, PA-C   Reason for Procedure:   Esophageal varices surveillance, iron deficiency anemia  Plan:    EGD and colonoscopy     HPI: Nathan Roberts is a 61 y.o. male here for egd for surveillance of esophageal varices and band ligation as needed for eradication and colonoscopy for iron deficiency anemia   Past Medical History:  Diagnosis Date   Allergy    Arthritis    Carpal tunnel syndrome    Diabetes mellitus without complication (HCC)    Esophageal varices (HCC)    GERD (gastroesophageal reflux disease)    GI bleed    Hyperlipidemia    Hypertension    Portal hypertensive gastropathy ([place])    Pre-diabetes     Past Surgical History:  Procedure Laterality Date   abscess     rectal area   CARPAL TUNNEL RELEASE     left   CARPAL TUNNEL RELEASE Right 07/07/2021   Procedure: RIGHT CARPAL TUNNEL RELEASE;  Surgeon: Daryll Brod, MD;  Location: [place];  Service: Orthopedics;  Laterality: Right;   CERVICAL FUSION     ESOPHAGEAL BANDING  10/07/2020   Procedure: ESOPHAGEAL BANDING;  Surgeon: Mauri Pole, MD;  [facility]: [facility];  Service: Endoscopy;;   ESOPHAGOGASTRODUODENOSCOPY (EGD) WITH PROPOFOL N/A 10/07/2020   Procedure: ESOPHAGOGASTRODUODENOSCOPY (EGD) WITH PROPOFOL;  Surgeon: Mauri Pole, MD;  [facility]: [facility];  Service: Endoscopy;  Laterality: N/A;   ESOPHAGOGASTRODUODENOSCOPY (EGD) WITH PROPOFOL N/A 01/18/2021   Procedure: ESOPHAGOGASTRODUODENOSCOPY (EGD) WITH PROPOFOL;  Surgeon: Mauri Pole, MD;  [facility]: [facility];  Service: Endoscopy;  Laterality: N/A;   FASCIECTOMY Left 02/10/2021   Procedure: FASCIECTOMY RING FINGER AND SMALL FINGER OF LEFT HAND;  Surgeon: Daryll Brod, MD;  Location: [place];  Service: Orthopedics;  Laterality: Left;   FASCIECTOMY Right 07/07/2021   Procedure: FASCIECTOMY RIGHT 1ST WEB, RIGHT  RING FINGER, RIGHT SMALL FINGER;  Surgeon: Daryll Brod, MD;  Location: [place];  Service: Orthopedics;  Laterality: Right;   SPINE SURGERY      Prior to Admission medications   Medication Sig Start Date End Date Taking? Authorizing Provider  amLODipine (NORVASC) 10 MG tablet Take 10 mg by mouth in the morning. 11/27/20  Yes [provider]  folic acid (FOLVITE) 1 MG tablet Take 1 tablet (1 mg total) by mouth daily. Patient taking differently: Take 1 mg by mouth 2 (two) times a week. 10/09/20  Yes Rai, Ripudeep K, MD  hydrOXYzine (ATARAX/VISTARIL) 25 MG tablet Take 1 tablet (25 mg total) by mouth every 6 (six) hours as needed for itching. 10/12/20  Yes Pearson Forster, NP  metFORMIN (GLUCOPHAGE) 500 MG tablet Take 500 mg by mouth daily.   Yes [provider]  mirtazapine (REMERON) 15 MG tablet Take 15 mg by mouth at bedtime. 01/04/22  Yes [provider]  Multiple Vitamins-Minerals (MULTIVITAMIN WITH MINERALS) tablet Take 1 tablet by mouth daily.   Yes [provider]  olmesartan (BENICAR) 40 MG tablet Take 40 mg by mouth daily. 01/05/22  Yes [provider]  pantoprazole (PROTONIX) 40 MG tablet Take 1 tablet (40 mg total) by mouth 2 (two) times daily before a meal. Patient taking differently: Take 40 mg by mouth daily. 03/23/22  Yes Zehr, Laban Emperor, PA-C  propranolol (INDERAL) 10 MG tablet Take 2 tablets in the morning and 1 tablet in the evening. KEEP YOUR December APPOINTMENT  WITH DR. Jurrell Royster FOR FURTHER REFILLS 06/19/21  Yes Flay Ghosh, Venia Minks, MD  spironolactone (ALDACTONE) 100 MG tablet Take 1 tablet (100 mg total) by mouth daily. 03/23/22  Yes Zehr, Laban Emperor, PA-C    No current facility-administered medications for this encounter.    Allergies as of 03/23/2022 - Review Complete 03/23/2022  Allergen Reaction Noted   Cephalexin Rash 08/12/2017   Doxycycline Rash 10/06/2020   Latex Hives and Rash 11/01/2011    Family History   Problem Relation Age of Onset   Early death Mother    Lung cancer Mother 28       lung--smoker   Atrial fibrillation Father    Heart disease Father    COPD Father    Hyperlipidemia Father    Hypertension Father    Colon cancer Neg Hx    Esophageal cancer Neg Hx    Stomach cancer Neg Hx    Rectal cancer Neg Hx     Social History   Socioeconomic History   Marital status: Divorced    Spouse name: Not on file   Number of children: Not on file   Years of education: Not on file   Highest education level: Not on file  Occupational History   Not on file  Tobacco Use   Smoking status: Former    Types: Cigarettes    Quit date: 12/08/1996    Years since quitting: 25.4   Smokeless tobacco: Never  Vaping Use   Vaping Use: Never used  Substance and Sexual Activity   Alcohol use: Yes    Alcohol/week: 9.0 standard drinks of alcohol    Types: 2 Glasses of wine, 5 Cans of beer, 1 Shots of liquor, 1 Standard drinks or equivalent per week   Drug use: No   Sexual activity: Not Currently  Other Topics Concern   Not on file  Social History Narrative   Not on file   Social Determinants of Health   Financial Resource Strain: Not on file  Food Insecurity: Not on file  Transportation Needs: Not on file  Physical Activity: Not on file  Stress: Not on file  Social Connections: Not on file  Intimate Partner Violence: Not on file    Review of Systems:  All other review of systems negative except as mentioned in the HPI.  Physical Exam: Vital signs in last 24 hours: Temp:  [97.8 F (36.6 C)] 97.8 F (36.6 C) (10/30 0709) Pulse Rate:  [71] 71 (10/30 0709) Resp:  [20] 20 (10/30 0709) BP: (159)/(78) 159/78 (10/30 0709) SpO2:  [98 %] 98 % (10/30 0709) Weight:  [105 kg] 105 kg (10/30 0709)   General:   Alert, NAD Lungs:  Clear .   Heart:  Regular rate and rhythm Abdomen:  Soft, nontender and nondistended. Neuro/Psych:  Alert and cooperative. Normal mood and affect. A and O x  3   K. Denzil Magnuson , MD [phone]

## 2022-05-29 NOTE — Transfer of Care (Signed)
Immediate Anesthesia Transfer of Care Note  Patient: DAMARI SUASTEGUI  Procedure(s) Performed: ESOPHAGOGASTRODUODENOSCOPY (EGD) WITH PROPOFOL COLONOSCOPY WITH PROPOFOL  Patient Location: Endoscopy Unit  Anesthesia Type:MAC  Level of Consciousness: drowsy  Airway & Oxygen Therapy: Patient Spontanous Breathing and Patient connected to face mask oxygen  Post-op Assessment: Report given to RN and Post -op Vital signs reviewed and stable  Post vital signs: Reviewed and stable  Last Vitals:  Vitals Value Taken Time  BP    Temp    Pulse 68 05/29/22 0813  Resp 21 05/29/22 0813  SpO2 100 % 05/29/22 0813  Vitals shown include unvalidated device data.  Last Pain:  Vitals:   05/29/22 0709  TempSrc: Oral  PainSc: 0-No pain         Complications: No notable events documented.

## 2022-05-29 NOTE — Addendum Note (Signed)
Addendum  created 05/29/22 0835 by Sharlette Dense, CRNA   Flowsheet accepted, Intraprocedure Flowsheets edited

## 2022-05-29 NOTE — Op Note (Signed)
[facility] Patient Name: Nathan Roberts Procedure Date: 05/29/2022 MRN: [id number] Attending MD: Napoleon Form , MD, [id number] Date of Birth: 1961/07/02 CSN: [id number] Age: 61 Admit Type: Outpatient Procedure:                Colonoscopy Indications:              Unexplained iron deficiency anemia Providers:                Napoleon Form, MD, Alan Ripper Technician,                            Technician, Marja Kays, RNFA, Blenda Mounts,                            RN Referring MD:              Medicines:                Monitored Anesthesia Care Complications:            No immediate complications. Estimated Blood Loss:     Estimated blood loss was minimal. Procedure:                Pre-Anesthesia Assessment:                           - Prior to the procedure, a History and Physical                            was performed, and patient medications and                            allergies were reviewed. The patient's tolerance of                            previous anesthesia was also reviewed. The risks                            and benefits of the procedure and the sedation                            options and risks were discussed with the patient.                            All questions were answered, and informed consent                            was obtained. Prior Anticoagulants: The patient has                            taken no anticoagulant or antiplatelet agents. ASA                            Grade Assessment: III - A patient with severe  systemic disease. After reviewing the risks and                            benefits, the patient was deemed in satisfactory                            condition to undergo the procedure.                           After obtaining informed consent, the colonoscope                            was passed under direct vision. Throughout the                            procedure, the  patient's blood pressure, pulse, and                            oxygen saturations were monitored continuously. The                            [id number]-HQ190L ([id number]) Olympus colonoscope was                            introduced through the anus with the intention of                            advancing to the cecum. The scope was advanced to                            the rectum before the procedure was aborted.                            Medications were given. The colonoscopy was                            technically difficult and complex due to poor bowel                            prep with stool present. The patient tolerated the                            procedure well. The quality of the bowel                            preparation was poor. Scope In: 8:05:57 AM Scope Out: 8:06:32 AM Total Procedure Duration: 0 hours 0 minutes 35 seconds  Findings:      The perianal and digital rectal examinations were normal.      A moderate amount of stool was found in the rectum and in the sigmoid       colon, precluding visualization. Impression:               - Preparation of the colon was poor.                           -  Stool in the rectum and in the sigmoid colon.                           - No specimens collected. Moderate Sedation:      Not Applicable - Patient had care per Anesthesia. Recommendation:           - Patient has a contact number available for                            emergencies. The signs and symptoms of potential                            delayed complications were discussed with the                            patient. Return to normal activities tomorrow.                            Written discharge instructions were provided to the                            patient.                           - Resume previous diet.                           - Continue present medications.                           - Repeat colonoscopy at the next available                             appointment because the bowel preparation was                            suboptimal.                           - For future colonoscopy the patient will require                            an extended preparation. If there are any                            questions, please contact the gastroenterologist. Procedure Code(s):        --- Professional ---                           [phone], 53, Colonoscopy, flexible; diagnostic,                            including collection of specimen(s) by brushing or                            washing, when performed (separate procedure) Diagnosis  Code(s):        --- Professional ---                           D50.9, Iron deficiency anemia, unspecified CPT copyright 2022 [employer]. All rights reserved. The codes documented in this report are preliminary and upon coder review may  be revised to meet current compliance requirements. Napoleon Form, MD 05/29/2022 8:13:36 AM This report has been signed electronically. Number of Addenda: 0

## 2022-05-29 NOTE — Op Note (Signed)
[facility] Patient Name: Nathan Roberts Procedure Date: 05/29/2022 MRN: [id number] Attending MD: Mauri Pole , MD, [id number] Date of Birth: 1961-05-23 CSN: [id number] Age: 61 Admit Type: Outpatient Procedure:                Upper GI endoscopy Indications:              Suspected upper gastrointestinal bleeding in                            patient with unexplained iron deficiency anemia,                            2nd degree variceal surveillance (following bleed                            and completed eradication) Providers:                Mauri Pole, MD, Hinton Dyer Technician,                            Technician, Brien Mates, RNFA, Benay Pillow,                            RN Referring MD:              Medicines:                Monitored Anesthesia Care Complications:            No immediate complications. Estimated Blood Loss:     Estimated blood loss was minimal. Procedure:                Pre-Anesthesia Assessment:                           - Prior to the procedure, a History and Physical                            was performed, and patient medications and                            allergies were reviewed. The patient's tolerance of                            previous anesthesia was also reviewed. The risks                            and benefits of the procedure and the sedation                            options and risks were discussed with the patient.                            All questions were answered, and informed consent                            was obtained. Prior Anticoagulants:  The patient has                            taken no anticoagulant or antiplatelet agents. ASA                            Grade Assessment: III - A patient with severe                            systemic disease. After reviewing the risks and                            benefits, the patient was deemed in satisfactory                             condition to undergo the procedure.                           After obtaining informed consent, the endoscope was                            passed under direct vision. Throughout the                            procedure, the patient's blood pressure, pulse, and                            oxygen saturations were monitored continuously. The                            [id number]-[id number] ([id number]) Olympus endoscope was introduced                            through the mouth, and advanced to the second part                            of duodenum. The upper GI endoscopy was                            accomplished without difficulty. The patient                            tolerated the procedure well. Scope In: Scope Out: Findings:      Grade I, small (< 5 mm) varices were found in the lower third of the       esophagus. They were less than 5 mm in largest diameter.      The exam of the esophagus was otherwise normal.      A small hiatal hernia was present.      The cardia and gastric fundus were normal on retroflexion. No gastric       varices      The examined duodenum was normal. Impression:               - Grade I and small (< 5 mm) esophageal varices.                           -  Small hiatal hernia. No gastric varices                           - Normal examined duodenum.                           - No specimens collected. Moderate Sedation:      N/A Recommendation:           - Patient has a contact number available for                            emergencies. The signs and symptoms of potential                            delayed complications were discussed with the                            patient. Return to normal activities tomorrow.                            Written discharge instructions were provided to the                            patient.                           - Resume previous diet.                           - Continue present medications.                           - Alcohol  cessation                           - Follow up in GI office with APP in 3-4 weeks                           - See the other procedure note for documentation of                            additional recommendations. Procedure Code(s):        --- Professional ---                           [phone], Esophagogastroduodenoscopy, flexible,                            transoral; diagnostic, including collection of                            specimen(s) by brushing or washing, when performed                            (separate procedure) Diagnosis Code(s):        --- Professional ---  I85.00, Esophageal varices without bleeding                           K44.9, Diaphragmatic hernia without obstruction or                            gangrene                           D50.9, Iron deficiency anemia, unspecified CPT copyright 2022 [employer] Association. All rights reserved. The codes documented in this report are preliminary and upon coder review may  be revised to meet current compliance requirements. Napoleon Form, MD 05/29/2022 8:18:53 AM This report has been signed electronically. Number of Addenda: 0

## 2022-05-29 NOTE — Progress Notes (Signed)
Gave pt some oj to drink per his request.  Pt's driver cannot be reached.  He is sitting in a w/c in [place] [address] waiting for driver.

## 2022-05-29 NOTE — Anesthesia Postprocedure Evaluation (Signed)
Anesthesia Post Note  Patient: HOYTE ZIEBELL  Procedure(s) Performed: ESOPHAGOGASTRODUODENOSCOPY (EGD) WITH PROPOFOL COLONOSCOPY WITH PROPOFOL     Patient location during evaluation: Endoscopy Anesthesia Type: MAC Level of consciousness: awake and alert, patient cooperative and oriented Pain management: pain level controlled Vital Signs Assessment: post-procedure vital signs reviewed and stable Respiratory status: nonlabored ventilation, spontaneous breathing and respiratory function stable Cardiovascular status: stable and blood pressure returned to baseline Postop Assessment: no apparent nausea or vomiting Anesthetic complications: no   No notable events documented.  Last Vitals:  Vitals:   05/29/22 0820 05/29/22 0830  BP: 125/69   Pulse: 69 68  Resp: (!) 24 17  Temp:    SpO2: 100% 97%    Last Pain:  Vitals:   05/29/22 0820  TempSrc:   PainSc: 0-No pain                 Evertt Chouinard,E. Jarry Manon

## 2022-05-31 ENCOUNTER — Encounter (HOSPITAL_COMMUNITY): Payer: Self-pay | Admitting: Gastroenterology

## 2022-06-19 ENCOUNTER — Encounter (HOSPITAL_COMMUNITY): Payer: Self-pay | Admitting: Gastroenterology

## 2022-06-19 ENCOUNTER — Telehealth: Payer: Self-pay | Admitting: Gastroenterology

## 2022-06-19 NOTE — Telephone Encounter (Signed)
Spoke with patient & made him aware that hospital procedure has been cancelled, and that Dr. Elana Alm nurse will be in contact with him when she returns to office for rescheduling a new date. Pt verbalized all understanding.

## 2022-06-19 NOTE — Telephone Encounter (Signed)
Patient called to cancel his hospital procedure states he will be out of town.

## 2022-06-27 ENCOUNTER — Ambulatory Visit (HOSPITAL_COMMUNITY)
Admission: RE | Admit: 2022-06-27 | Payer: BC Managed Care – PPO | Source: Ambulatory Visit | Admitting: Gastroenterology

## 2022-06-27 ENCOUNTER — Encounter (HOSPITAL_COMMUNITY): Admission: RE | Payer: Self-pay | Source: Ambulatory Visit

## 2022-06-27 SURGERY — COLONOSCOPY WITH PROPOFOL
Anesthesia: Monitor Anesthesia Care

## 2022-11-15 DIAGNOSIS — K219 Gastro-esophageal reflux disease without esophagitis: Secondary | ICD-10-CM | POA: Diagnosis not present

## 2022-11-15 DIAGNOSIS — I10 Essential (primary) hypertension: Secondary | ICD-10-CM | POA: Diagnosis not present

## 2022-11-15 DIAGNOSIS — E782 Mixed hyperlipidemia: Secondary | ICD-10-CM | POA: Diagnosis not present

## 2022-11-15 DIAGNOSIS — E1169 Type 2 diabetes mellitus with other specified complication: Secondary | ICD-10-CM | POA: Diagnosis not present

## 2022-11-15 DIAGNOSIS — Z760 Encounter for issue of repeat prescription: Secondary | ICD-10-CM | POA: Diagnosis not present

## 2022-11-15 DIAGNOSIS — Z6831 Body mass index (BMI) 31.0-31.9, adult: Secondary | ICD-10-CM | POA: Diagnosis not present

## 2022-11-30 DIAGNOSIS — Z23 Encounter for immunization: Secondary | ICD-10-CM | POA: Diagnosis not present

## 2022-12-01 IMAGING — US US HEPATIC LIVER DOPPLER
1 series · 14 of 25 positions shown · non-contrast
Comparison: None.

CLINICAL DATA: 59-year-old male with history of cirrhosis.

EXAM:
DUPLEX ULTRASOUND OF LIVER
TECHNIQUE: Color and duplex Doppler ultrasound was performed to evaluate the
hepatic in-flow and out-flow vessels.

[Series 1: us hepatic liver doppler · 14 of 38 slices shown]
[im 1/38]
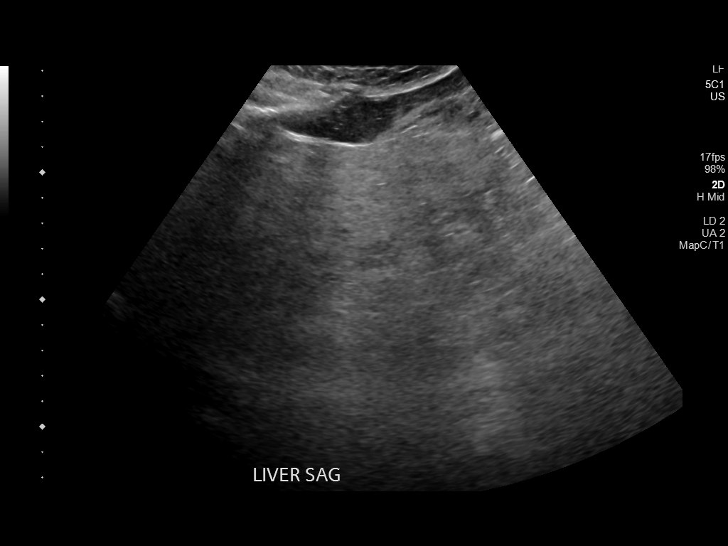
[im 4/38]
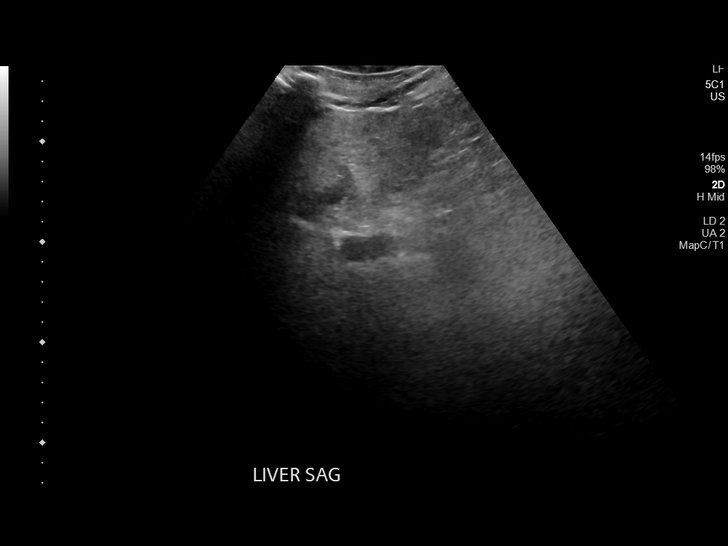
[im 7/38]
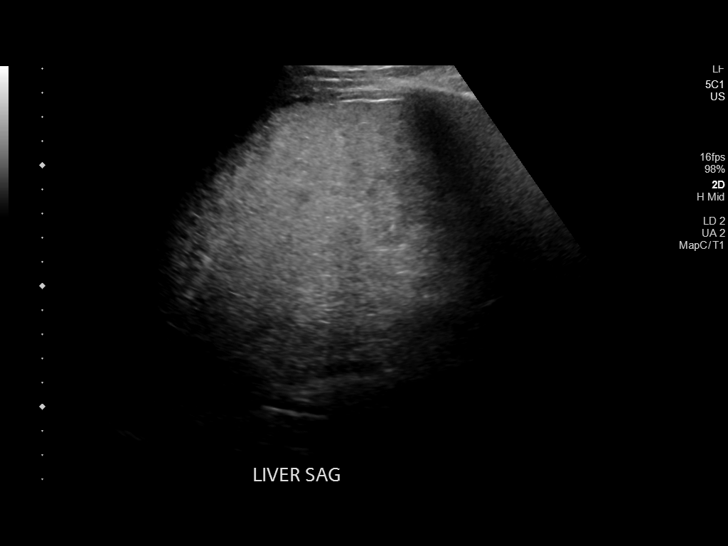
[im 10/38]
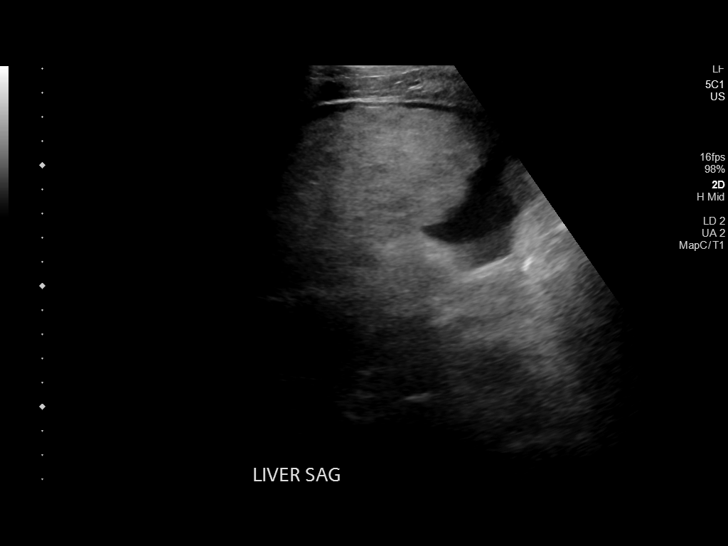
[im 13/38]
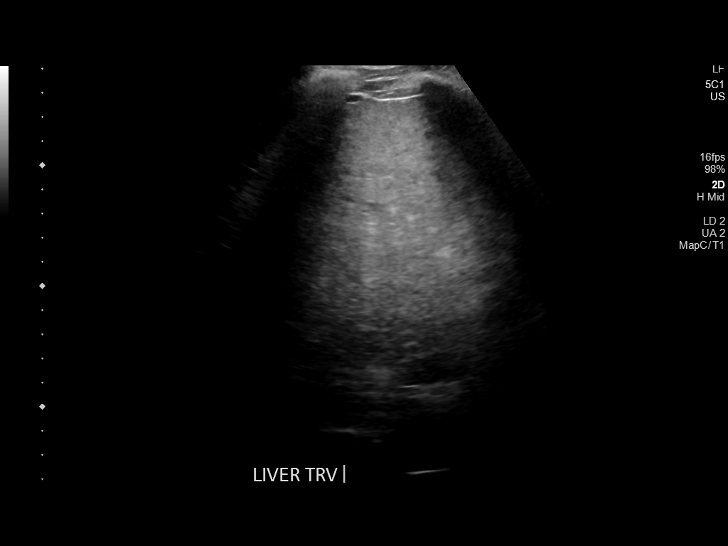
[im 14/38]
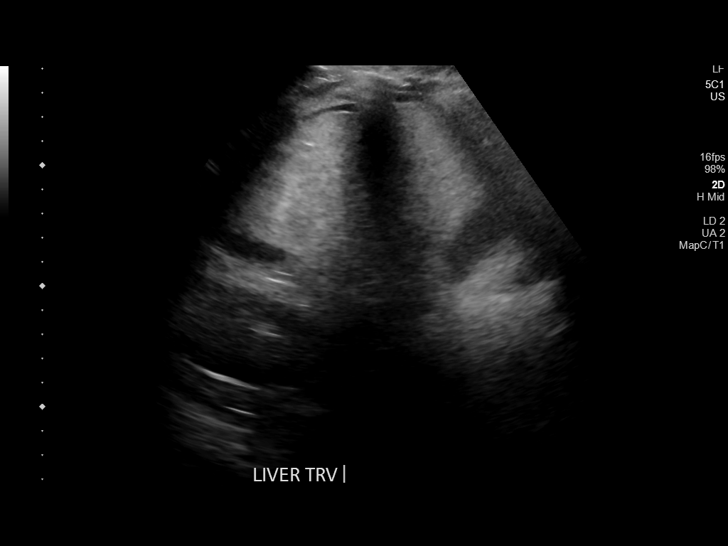
[im 17/38]
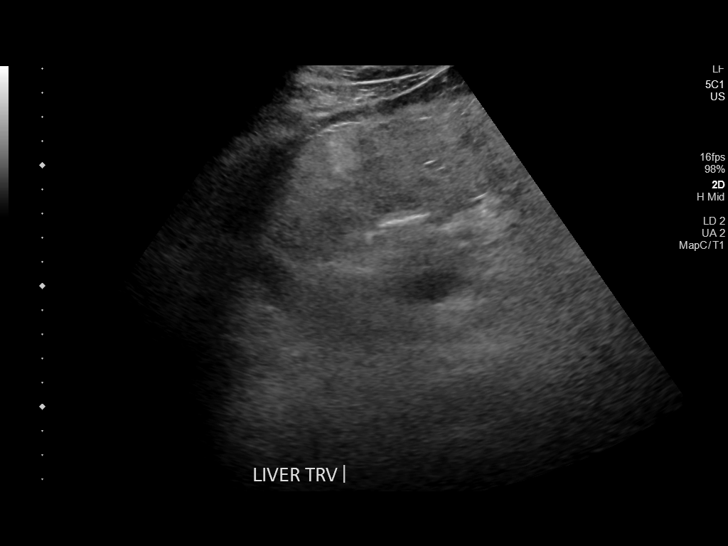
[im 21/38]
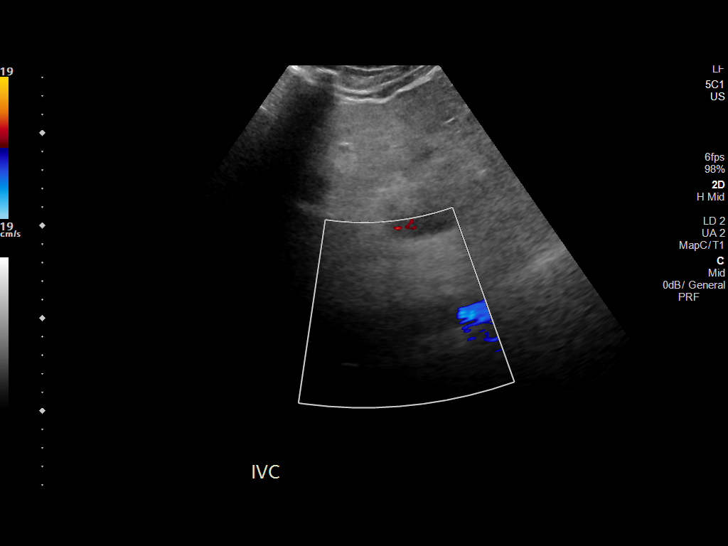
[im 24/38]
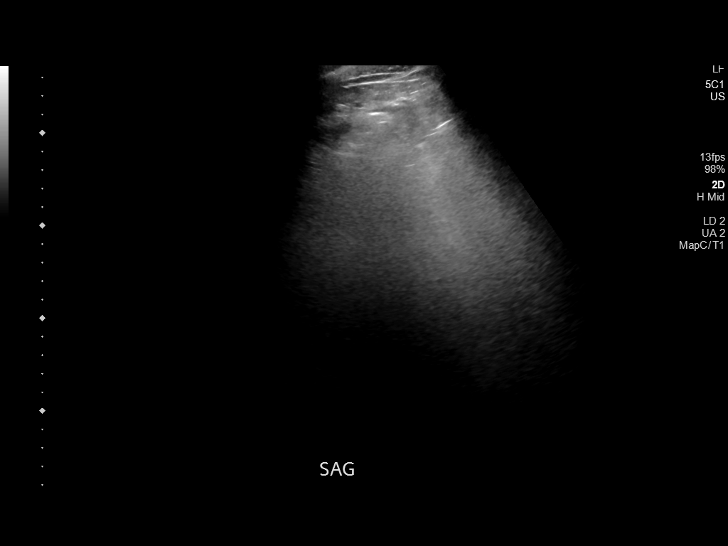
[im 25/38]
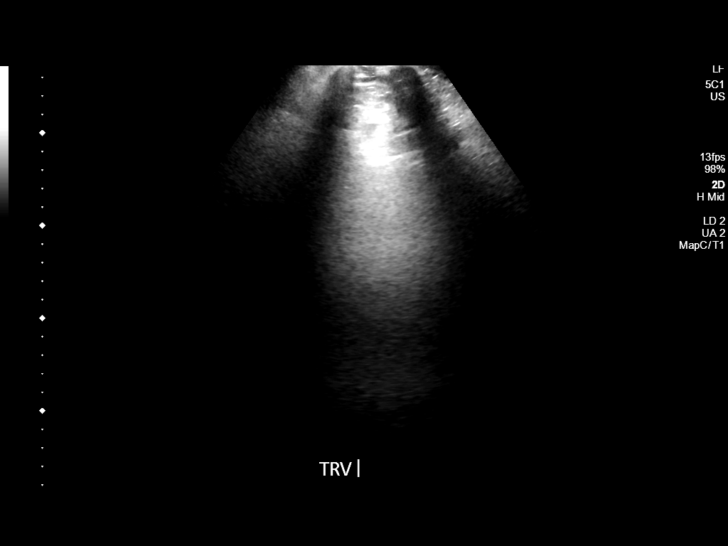
[im 28/38]
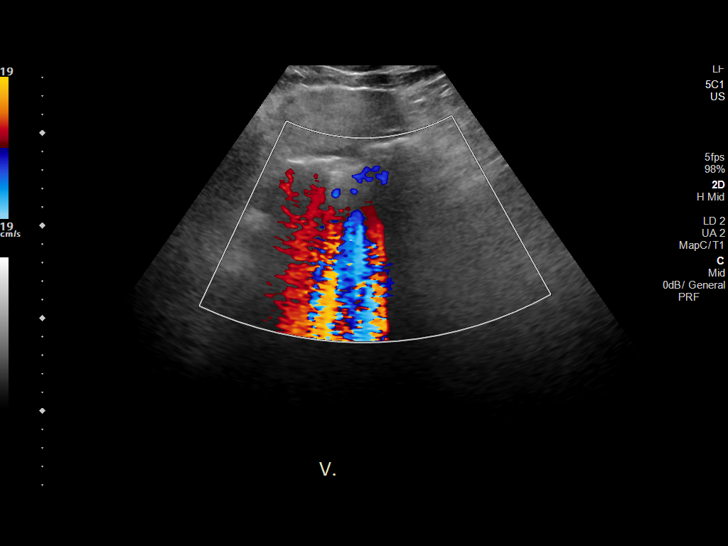
[im 31/38]
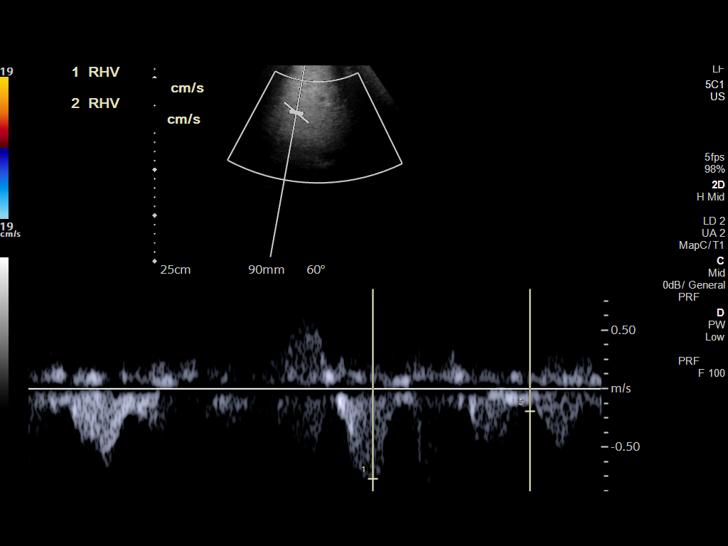
[im 34/38]
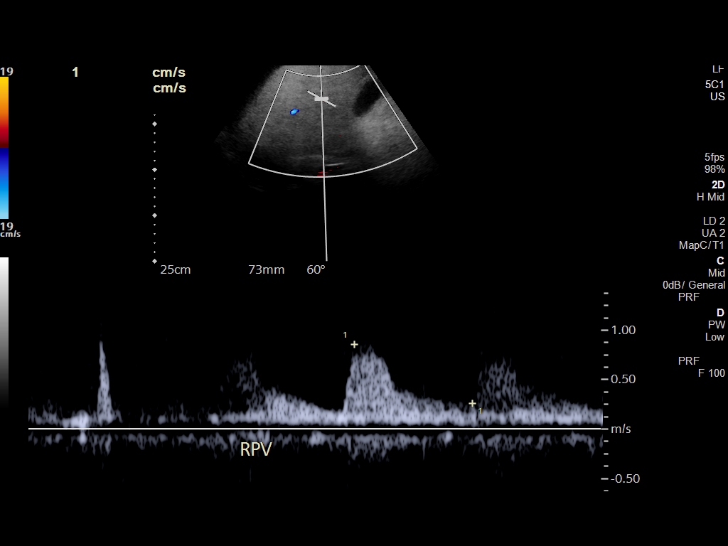
[im 38/38]
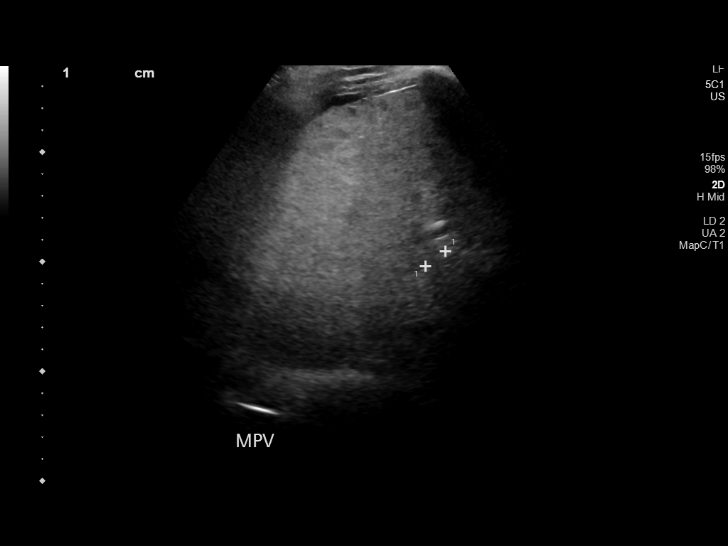

[14 of 25 positions shown; findings below may reference images not displayed]

FINDINGS: Liver: Increased, coarsened echogenicity.  Nodular contour.

No focal lesion, mass or intrahepatic biliary ductal dilatation.

Main Portal Vein size: 1.13 cm

Portal Vein Velocities

Main Prox: Not well visualized

Main Mid: 84 cm/sec, antegrade

Main Dist: Not well visualized
Right: 86 cm/sec, antegrade
Left: 113 cm/sec, antegrade

Hepatic Vein Velocities

Right:  77 cm/sec

Middle:  26 cm/sec

Left:  56 cm/sec

IVC: Present and patent with normal respiratory phasicity.

Hepatic Artery Velocity:  118 cm/sec

Splenic Vein Velocity: Not well visualized

Spleen: Not visualized.

Portal Vein Occlusion/Thrombus: No

Splenic Vein Occlusion/Thrombus: No

Ascites: None

Varices: None
IMPRESSION: Morphologic changes of hepatic cirrhosis without sonographic
evidence of significant portal hypertension or hepatoma.

## 2022-12-22 ENCOUNTER — Encounter: Payer: Self-pay | Admitting: Family Medicine

## 2022-12-22 ENCOUNTER — Ambulatory Visit (INDEPENDENT_AMBULATORY_CARE_PROVIDER_SITE_OTHER): Payer: 59 | Admitting: Family Medicine

## 2022-12-22 VITALS — BP 109/74 | HR 60 | Ht 69.0 in | Wt 223.0 lb

## 2022-12-22 DIAGNOSIS — K2100 Gastro-esophageal reflux disease with esophagitis, without bleeding: Secondary | ICD-10-CM | POA: Diagnosis not present

## 2022-12-22 DIAGNOSIS — F419 Anxiety disorder, unspecified: Secondary | ICD-10-CM | POA: Diagnosis not present

## 2022-12-22 DIAGNOSIS — R7303 Prediabetes: Secondary | ICD-10-CM

## 2022-12-22 DIAGNOSIS — Z0000 Encounter for general adult medical examination without abnormal findings: Secondary | ICD-10-CM

## 2022-12-22 DIAGNOSIS — K429 Umbilical hernia without obstruction or gangrene: Secondary | ICD-10-CM

## 2022-12-22 DIAGNOSIS — Z789 Other specified health status: Secondary | ICD-10-CM | POA: Diagnosis not present

## 2022-12-22 DIAGNOSIS — K7030 Alcoholic cirrhosis of liver without ascites: Secondary | ICD-10-CM

## 2022-12-22 DIAGNOSIS — F32A Depression, unspecified: Secondary | ICD-10-CM | POA: Diagnosis not present

## 2022-12-22 DIAGNOSIS — E785 Hyperlipidemia, unspecified: Secondary | ICD-10-CM | POA: Diagnosis not present

## 2022-12-22 DIAGNOSIS — I10 Essential (primary) hypertension: Secondary | ICD-10-CM

## 2022-12-22 LAB — LIPID PANEL
Cholesterol: 149 mg/dL (ref 0–200)
HDL: 14.8 mg/dL — ABNORMAL LOW (ref >39.00)
LDL Cholesterol: 109 mg/dL — ABNORMAL HIGH (ref 0–99)
NonHDL: 134.1
Total CHOL/HDL Ratio: 10
Triglycerides: 124 mg/dL (ref 0.0–149.0)
VLDL: 24.8 mg/dL (ref 0.0–40.0)

## 2022-12-22 LAB — COMPREHENSIVE METABOLIC PANEL
ALT: 15 U/L (ref 0–53)
AST: 52 U/L — ABNORMAL HIGH (ref 0–37)
Albumin: 2.5 g/dL — ABNORMAL LOW (ref 3.5–5.2)
Alkaline Phosphatase: 116 U/L (ref 39–117)
BUN: 11 mg/dL (ref 6–23)
CO2: 26 mEq/L (ref 19–32)
Calcium: 8.3 mg/dL — ABNORMAL LOW (ref 8.4–10.5)
Chloride: 102 mEq/L (ref 96–112)
Creatinine, Ser: 0.76 mg/dL (ref 0.40–1.50)
GFR: 95.2 mL/min (ref >60.00)
Glucose, Bld: 109 mg/dL — ABNORMAL HIGH (ref 70–99)
Potassium: 4.1 mEq/L (ref 3.5–5.1)
Sodium: 137 mEq/L (ref 135–145)
Total Bilirubin: 4.1 mg/dL — ABNORMAL HIGH (ref 0.2–1.2)
Total Protein: 7.2 g/dL (ref 6.0–8.3)

## 2022-12-22 LAB — CBC WITH DIFFERENTIAL/PLATELET
Basophils Absolute: 0 10*3/uL (ref 0.0–0.1)
Basophils Relative: 0.7 % (ref 0.0–3.0)
Eosinophils Absolute: 0.2 10*3/uL (ref 0.0–0.7)
Eosinophils Relative: 6.2 % — ABNORMAL HIGH (ref 0.0–5.0)
HCT: 36.9 % — ABNORMAL LOW (ref 39.0–52.0)
Hemoglobin: 12.3 g/dL — ABNORMAL LOW (ref 13.0–17.0)
Lymphocytes Relative: 34.3 % (ref 12.0–46.0)
Lymphs Abs: 1.4 10*3/uL (ref 0.7–4.0)
MCHC: 33.3 g/dL (ref 30.0–36.0)
MCV: 100.5 fl — ABNORMAL HIGH (ref 78.0–100.0)
Monocytes Absolute: 0.8 10*3/uL (ref 0.1–1.0)
Monocytes Relative: 20.4 % — ABNORMAL HIGH (ref 3.0–12.0)
Neutro Abs: 1.5 10*3/uL (ref 1.4–7.7)
Neutrophils Relative %: 38.4 % — ABNORMAL LOW (ref 43.0–77.0)
Platelets: 133 10*3/uL — ABNORMAL LOW (ref 150.0–400.0)
RBC: 3.67 Mil/uL — ABNORMAL LOW (ref 4.22–5.81)
RDW: 15.7 % — ABNORMAL HIGH (ref 11.5–15.5)
WBC: 4 10*3/uL (ref 4.0–10.5)

## 2022-12-22 LAB — TSH: TSH: 3.19 u[IU]/mL (ref 0.35–5.50)

## 2022-12-22 LAB — HEMOGLOBIN A1C: Hgb A1c MFr Bld: 4.8 % (ref 4.6–6.5)

## 2022-12-22 MED ORDER — OLMESARTAN MEDOXOMIL 40 MG PO TABS
40.0000 mg | ORAL_TABLET | Freq: Every day | ORAL | 1 refills | Status: DC
Start: 2022-12-22 — End: 2023-01-14

## 2022-12-22 MED ORDER — PROPRANOLOL HCL 10 MG PO TABS
ORAL_TABLET | ORAL | 3 refills | Status: DC
Start: 2022-12-22 — End: 2023-01-14

## 2022-12-22 MED ORDER — MIRTAZAPINE 15 MG PO TABS
15.0000 mg | ORAL_TABLET | Freq: Every day | ORAL | 0 refills | Status: DC
Start: 2022-12-22 — End: 2023-01-14

## 2022-12-22 MED ORDER — PANTOPRAZOLE SODIUM 40 MG PO TBEC
40.0000 mg | DELAYED_RELEASE_TABLET | Freq: Two times a day (BID) | ORAL | 5 refills | Status: DC
Start: 2022-12-22 — End: 2023-02-20

## 2022-12-22 NOTE — Assessment & Plan Note (Signed)
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 Encouraged avoidance of alcohol.

## 2022-12-22 NOTE — Assessment & Plan Note (Addendum)
Very large umbilical hernia without acute signs of obstruction or infection Patient would like a second opinion as it is getting larger and annoying- referral placed Patient aware of signs/symptoms requiring further/urgent evaluation.

## 2022-12-22 NOTE — Assessment & Plan Note (Signed)
Significant distention. Denies pain or other symptoms.  Following with GI Reports sober for the past 3 months

## 2022-12-22 NOTE — Assessment & Plan Note (Signed)
Blood pressure is at goal for age and co-morbidities.   Recommendations: continue propranolol 10 mg BID and olmesartan 40 mg daily - BP goal <130/80 - monitor and log blood pressures at home - check around the same time each day in a relaxed setting - Limit salt to <2000 mg/day - Follow DASH eating plan (heart healthy diet) - limit alcohol to 2 standard drinks per day for men and 1 per day for women - avoid tobacco products - get at least 2 hours of regular aerobic exercise weekly Patient aware of signs/symptoms requiring further/urgent evaluation. Labs updated today.

## 2022-12-22 NOTE — Patient Instructions (Addendum)
Schedule a follow-up with [facility] - they wanted to see you last fall after your endoscopy procedure.  New surgical referral placed for worsening hernia. They will be calling you. If you develop any pain or skin changes, please go to the hospital.  Updating your labs and sending in refills today.   -------------   Thank you for choosing [facility] at [facility] for your Primary Care needs. I am excited for the opportunity to partner with you to meet your health care goals. It was a pleasure meeting you today!  Information on diet, exercise, and health maintenance recommendations are listed below. This is information to help you be sure you are on track for optimal health and monitoring.   Please look over this and let us know if you have any questions or if you have completed any of the health maintenance outside of [facility] so that we can be sure your records are up to date.  ___________________________________________________________  MyChart:  For all urgent or time sensitive needs we ask that you please call the office to avoid delays. Our number is [phone]. MyChart is not constantly monitored and due to the large volume of messages a day, replies may take up to 72 business hours.  MyChart Policy: MyChart allows for you to see your visit notes, after visit summary, provider recommendations, lab and tests results, make an appointment, request refills, and contact your provider or the office for non-urgent questions or concerns. Providers are seeing patients during normal business hours and do not have built in time to review MyChart messages.  We ask that you allow a minimum of 3 business days for responses to [employer]. For this reason, please do not send urgent requests through MyChart. Please call the office at [phone]. New and ongoing conditions may require a visit. We have virtual and in-person visits available for your convenience.  Complex  MyChart concerns may require a visit. Your provider may request you schedule a virtual or in-person visit to ensure we are providing the best care possible. MyChart messages sent after 11:00 AM on Friday will not be received by the provider until Monday morning.    Lab and Test Results: You will receive your lab and test results on MyChart as soon as they are completed and results have been sent by the lab or testing facility. Due to this service, you will receive your results BEFORE your provider.  I review lab and test results each morning prior to seeing patients. Some results require collaboration with other providers to ensure you are receiving the most appropriate care. For this reason, we ask that you please allow a minimum of 3-5 business days from the time that ALL results have been received for your provider to receive and review lab and test results and contact you about these.  Most lab and test result comments from the provider will be sent through MyChart. Your provider may recommend changes to the plan of care, follow-up visits, repeat testing, ask questions, or request an office visit to discuss these results. You may reply directly to this message or call the office to provide information for the provider or set up an appointment. In some instances, you will be called with test results and recommendations. Please let us know if this is preferred and we will make note of this in your chart to provide this for you.    If you have not heard a response to your lab or  test results in 5 business days from all results returning to MyChart, please call the office to let us know. We ask that you please avoid calling prior to this time unless there is an emergent concern. Due to high call volumes, this can delay the resulting process.  After Hours: For all non-emergency after hours needs, please call the office at [phone] and select the option to reach the on-call  service. On-call services  are shared between multiple [place] offices and therefore it will not be possible to speak directly with your provider. On-call providers may provide medical advice and recommendations, but are unable to provide refills for maintenance medications.  For all emergency or urgent medical needs after normal business hours, we recommend that you seek care at the closest Urgent Care or Emergency Department to ensure appropriate treatment in a timely manner.  [facility] has a 24 hour emergency room located on the ground floor for your convenience.   Urgent Concerns During the Business Day Providers are seeing patients from 8AM to 5PM with a busy schedule and are most often not able to respond to non-urgent calls until the end of the day or the next business day. If you should have URGENT concerns during the day, please call and speak to the nurse or schedule a same day appointment so that we can address your concern without delay.   Thank you, again, for choosing me as your health care partner. I appreciate your trust and look forward to learning more about you!   Lollie Marrow Reola Calkins, DNP, FNP-C  ___________________________________________________________  Health Maintenance Recommendations Screening Testing Mammogram Every 1-2 years based on history and risk factors Starting at age 48 Pap Smear Ages 21-39 every 3 years Ages 36-65 every 5 years with HPV testing More frequent testing may be required based on results and history Colon Cancer Screening Every 1-10 years based on test performed, risk factors, and history Starting at age 29 Bone Density Screening Every 2-10 years based on history Starting at age 12 for women Recommendations for men differ based on medication usage, history, and risk factors AAA Screening One time ultrasound Men 35-58 years old who have ever smoked Lung Cancer Screening Low Dose Lung CT every 12 months Age 76-80 years with a 20 pack-year smoking history  who still smoke or who have quit within the last 15 years  Screening Labs Routine  Labs: Complete Blood Count (CBC), Complete Metabolic Panel (CMP), Cholesterol (Lipid Panel) Every 6-12 months based on history and medications May be recommended more frequently based on current conditions or previous results Hemoglobin A1c Lab Every 3-12 months based on history and previous results Starting at age 34 or earlier with diagnosis of diabetes, high cholesterol, BMI >26, and/or risk factors Frequent monitoring for patients with diabetes to ensure blood sugar control Thyroid Panel  Every 6 months based on history, symptoms, and risk factors May be repeated more often if on medication HIV One time testing for all patients 16 and older May be repeated more frequently for patients with increased risk factors or exposure Hepatitis C One time testing for all patients 35 and older May be repeated more frequently for patients with increased risk factors or exposure Gonorrhea, Chlamydia Every 12 months for all sexually active persons 13-24 years Additional monitoring may be recommended for those who are considered high risk or who have symptoms PSA Men 77-55 years old with risk factors Additional screening may be recommended from age 21-69 based on risk  factors, symptoms, and history  Vaccine Recommendations Tetanus Booster All adults every 10 years Flu Vaccine All patients 6 months and older every year COVID Vaccine All patients 12 years and older Initial dosing with booster May recommend additional booster based on age and health history HPV Vaccine 2 doses all patients age 70-26 Dosing may be considered for patients over 26 Shingles Vaccine (Shingrix) 2 doses all adults 50 years and older Pneumonia (Pneumovax 23) All adults 65 years and older May recommend earlier dosing based on health history Pneumonia (Prevnar 31) All adults 65 years and older Dosed 1 year after Pneumovax  23 Pneumonia (Prevnar 20) All adults 65 years and older (adults 19-64 with certain conditions or risk factors) 1 dose  For those who have not received Prevnar 13 vaccine previously   Additional Screening, Testing, and Vaccinations may be recommended on an individualized basis based on family history, health history, risk factors, and/or exposure.  __________________________________________________________  Diet Recommendations for All Patients  I recommend that all patients maintain a diet low in saturated fats, carbohydrates, and cholesterol. While this can be challenging at first, it is not impossible and small changes can make big differences.  Things to try: Decreasing the amount of soda, sweet tea, and/or juice to one or less per day and replace with water While water is always the first choice, if you do not like water you may consider adding a water additive without sugar to improve the taste other sugar free drinks Replace potatoes with a brightly colored vegetable  Use healthy oils, such as canola oil or olive oil, instead of butter or hard margarine Limit your bread intake to two pieces or less a day Replace regular pasta with low carb pasta options Bake, broil, or grill foods instead of frying Monitor portion sizes  Eat smaller, more frequent meals throughout the day instead of large meals  An important thing to remember is, if you love foods that are not great for your health, you don't have to give them up completely. Instead, allow these foods to be a reward when you have done well. Allowing yourself to still have special treats every once in a while is a nice way to tell yourself thank you for working hard to keep yourself healthy.   Also remember that every day is a new day. If you have a bad day and "fall off the wagon", you can still climb right back up and keep moving along on your journey!  We have resources available to help you!  Some websites that may be helpful  include: www.http://www.wall-moore.info/  Www.VeryWellFit.com _____________________________________________________________  Activity Recommendations for All Patients  I recommend that all adults get at least 20 minutes of moderate physical activity that elevates your heart rate at least 5 days out of the week.  Some examples include: Walking or jogging at a pace that allows you to carry on a conversation Cycling (stationary bike or outdoors) Water aerobics Yoga Weight lifting Dancing If physical limitations prevent you from putting stress on your joints, exercise in a pool or seated in a chair are excellent options.  Do determine your MAXIMUM heart rate for activity: 220 - YOUR AGE = MAX Heart Rate   Remember! Do not push yourself too hard.  Start slowly and build up your pace, speed, weight, time in exercise, etc.  Allow your body to rest between exercise and get good sleep. You will need more water than normal when you are exerting yourself. Do not wait until  you are thirsty to drink. Drink with a purpose of getting in at least 8, 8 ounce glasses of water a day plus more depending on how much you exercise and sweat.    If you begin to develop dizziness, chest pain, abdominal pain, jaw pain, shortness of breath, headache, vision changes, lightheadedness, or other concerning symptoms, stop the activity and allow your body to rest. If your symptoms are severe, seek emergency evaluation immediately. If your symptoms are concerning, but not severe, please let us know so that we can recommend further evaluation.

## 2022-12-22 NOTE — Progress Notes (Signed)
New Patient Office Visit  Subjective    Patient ID: Nathan Roberts, male    DOB: January 07, 1961  Age: 62 y.o. MRN: [id number]  CC:  Chief Complaint  Patient presents with   Establish Care    HPI Nathan Roberts presents to establish care. States he just retired in January and had an insurance change.    Hypertension: - Medications: propranolol 10 mg BID, olmesartan 40 mg daily - Compliance: good - Checking BP at home: no - Denies any SOB, recurrent headaches, CP, vision changes, LE edema, dizziness, palpitations, or medication side effects. - Diet: general - Exercise: minimal - He was following with [facility] (last visit 05/10/22) - due to schedule an appointment with them  Hyperlipidemia: - medications: none - compliance: n/a - medication SEs: n/a The ASCVD Risk score (Arnett DK, et al., 2019) failed to calculate for the following reasons:   The valid HDL cholesterol range is 20 to 100 mg/dL   The valid total cholesterol range is 130 to 320 mg/dL   Prediabetes: - Checking glucose at home: no - Medications: metformin 500 mg daily - Compliance: good - Denies symptoms of hypoglycemia, polyuria, polydipsia, numbness extremities, foot ulcers/trauma, wounds that are not healing, medication side effects  Lab Results  Component Value Date   HGBA1C 5.2 10/06/2020   Unbilical hernia: - Evaluated by [facility] on 04/28/22 but was not reporting any symptoms at the time.  - "Given the hepatic cirrhosis with splenorenal shunting and the ascites present on MRI, I do not believe he is a candidate for umbilical hernia repair here in [place]. He needs to get his alcohol use and liver disease under control for any surgical procedure. I believe he is currently lower risk for incarceration. If he still would like to pursue surgery, it will need to be done at a tertiary care center because of the significance of his liver disease. He would be at risk for ascites leak  or worsening of her disease with general anesthesia which would be treated at a tertiary care center."   - Today he reports the hernia has been getting larger, but it is not painful. States it is annoying. He would like a referral for a second opinion  Alcoholic cirrhois: - He follows with [facility] (last saw them in August 2023)   - States he quite drinking alcohol 3 months ago  - EGD/Colonoscopy 05/29/22: Grade I, small (<5 mm) esophageal varices. Small hiatal hernia. No gastric varices. Normal examined duodenum. No specimens colleted. Preparation of colon was poor. No specimens collected.    Chronic low back pain and lumbar facet arthropathy: - He was  following with pain management clinic. He last saw them in September and an MRI was recommended.  - States his pain is stable right now. Does not want to address today  - MRI 05/23/2022 IMPRESSION: 1. Multilevel severe facet degenerative change resulting in moderate to severe neural foraminal stenosis at L2-L3 (left), L4-L5 (right), L5-S1 (bilateral), as above. 2. No evidence of high grade spinal canal stenosis.    Depression/anxiety: - States he was on Remeron for awhile, but ran out and needs refill. He is having trouble sleeping because he is nervous about his hernia. No SI/HI.     Outpatient Encounter Medications as of 12/22/2022  Medication Sig   metFORMIN (GLUCOPHAGE) 500 MG tablet Take 500 mg by mouth daily.   Multiple Vitamins-Minerals (MULTIVITAMIN WITH MINERALS) tablet Take 1 tablet by mouth daily.   [  DISCONTINUED] mirtazapine (REMERON) 15 MG tablet Take 15 mg by mouth at bedtime.   [DISCONTINUED] olmesartan (BENICAR) 40 MG tablet Take 40 mg by mouth daily.   [DISCONTINUED] pantoprazole (PROTONIX) 40 MG tablet Take 1 tablet (40 mg total) by mouth 2 (two) times daily before a meal. (Patient taking differently: Take 40 mg by mouth daily.)   [DISCONTINUED] propranolol (INDERAL) 10 MG tablet Take 2 tablets in the morning and 1  tablet in the evening. KEEP YOUR December APPOINTMENT WITH DR. NANDIGAM FOR FURTHER REFILLS   mirtazapine (REMERON) 15 MG tablet Take 1 tablet (15 mg total) by mouth at bedtime.   olmesartan (BENICAR) 40 MG tablet Take 1 tablet (40 mg total) by mouth daily.   pantoprazole (PROTONIX) 40 MG tablet Take 1 tablet (40 mg total) by mouth 2 (two) times daily before a meal.   propranolol (INDERAL) 10 MG tablet Take 2 tablets in the morning and 1 tablet in the evening. KEEP YOUR December APPOINTMENT WITH DR. NANDIGAM FOR FURTHER REFILLS   [DISCONTINUED] amLODipine (NORVASC) 10 MG tablet Take 10 mg by mouth in the morning.   [DISCONTINUED] folic acid (FOLVITE) 1 MG tablet Take 1 tablet (1 mg total) by mouth daily. (Patient taking differently: Take 1 mg by mouth 2 (two) times a week.)   [DISCONTINUED] hydrOXYzine (ATARAX/VISTARIL) 25 MG tablet Take 1 tablet (25 mg total) by mouth every 6 (six) hours as needed for itching.   [DISCONTINUED] spironolactone (ALDACTONE) 100 MG tablet Take 1 tablet (100 mg total) by mouth daily.   No facility-administered encounter medications on file as of 12/22/2022.    Past Medical History:  Diagnosis Date   Allergy    Arthritis    Carpal tunnel syndrome    Diabetes mellitus without complication (HCC)    Esophageal varices (HCC)    GERD (gastroesophageal reflux disease)    GI bleed    Hyperlipidemia    Hypertension    Portal hypertensive gastropathy (HCC)    Pre-diabetes     Past Surgical History:  Procedure Laterality Date   abscess     rectal area   CARPAL TUNNEL RELEASE     left   CARPAL TUNNEL RELEASE Right 07/07/2021   Procedure: RIGHT CARPAL TUNNEL RELEASE;  Surgeon: Cindee Salt, MD;  Location: [facility];  Service: Orthopedics;  Laterality: Right;   CERVICAL FUSION     COLONOSCOPY WITH PROPOFOL N/A 05/29/2022   Procedure: COLONOSCOPY WITH PROPOFOL;  Surgeon: Napoleon Form, MD;  Location: [facility];  Service: Gastroenterology;   Laterality: N/A;   ESOPHAGEAL BANDING  10/07/2020   Procedure: ESOPHAGEAL BANDING;  Surgeon: Napoleon Form, MD;  Location: [facility];  Service: Endoscopy;;   ESOPHAGOGASTRODUODENOSCOPY (EGD) WITH PROPOFOL N/A 10/07/2020   Procedure: ESOPHAGOGASTRODUODENOSCOPY (EGD) WITH PROPOFOL;  Surgeon: Napoleon Form, MD;  Location: [facility];  Service: Endoscopy;  Laterality: N/A;   ESOPHAGOGASTRODUODENOSCOPY (EGD) WITH PROPOFOL N/A 01/18/2021   Procedure: ESOPHAGOGASTRODUODENOSCOPY (EGD) WITH PROPOFOL;  Surgeon: Napoleon Form, MD;  Location: [facility];  Service: Endoscopy;  Laterality: N/A;   ESOPHAGOGASTRODUODENOSCOPY (EGD) WITH PROPOFOL N/A 05/29/2022   Procedure: ESOPHAGOGASTRODUODENOSCOPY (EGD) WITH PROPOFOL;  Surgeon: Napoleon Form, MD;  Location: [facility];  Service: Gastroenterology;  Laterality: N/A;   FASCIECTOMY Left 02/10/2021   Procedure: FASCIECTOMY RING FINGER AND SMALL FINGER OF LEFT HAND;  Surgeon: Cindee Salt, MD;  Location: [facility];  Service: Orthopedics;  Laterality: Left;   FASCIECTOMY Right 07/07/2021   Procedure: FASCIECTOMY RIGHT 1ST WEB,  RIGHT RING FINGER, RIGHT SMALL FINGER;  Surgeon: Cindee Salt, MD;  Location: [facility];  Service: Orthopedics;  Laterality: Right;   SPINE SURGERY      Family History  Problem Relation Age of Onset   Early death Mother    Lung cancer Mother 26       lung--smoker   Alcohol abuse Mother    Atrial fibrillation Father    Heart disease Father    COPD Father    Hyperlipidemia Father    Hypertension Father    Arthritis Paternal Grandmother    Cancer Paternal Grandfather    Colon cancer Neg Hx    Esophageal cancer Neg Hx    Stomach cancer Neg Hx    Rectal cancer Neg Hx     Social History   Socioeconomic History   Marital status: Divorced    Spouse name: Not on file   Number of children: Not on file   Years of education: Not on file   Highest education level: Not on file   Occupational History   Not on file  Tobacco Use   Smoking status: Former    Types: Cigarettes    Quit date: 12/08/1996    Years since quitting: 26.0   Smokeless tobacco: Never  Vaping Use   Vaping Use: Never used  Substance and Sexual Activity   Alcohol use: Yes    Alcohol/week: 9.0 standard drinks of alcohol    Types: 2 Glasses of wine, 5 Cans of beer, 1 Shots of liquor, 1 Standard drinks or equivalent per week   Drug use: No   Sexual activity: Not Currently  Other Topics Concern   Not on file  Social History Narrative   Not on file   Social Determinants of Health   Financial Resource Strain: Not on file  Food Insecurity: Not on file  Transportation Needs: Not on file  Physical Activity: Not on file  Stress: Not on file  Social Connections: Not on file  Intimate Partner Violence: Not on file    ROS All review of systems negative except what is listed in the HPI      Objective    BP 109/74   Pulse 60   Ht 5\' 9"  (1.753 m)   Wt 223 lb (101.2 kg)   SpO2 100%   BMI 32.93 kg/m   Physical Exam Vitals reviewed.  Constitutional:      Appearance: Normal appearance.  Cardiovascular:     Rate and Rhythm: Normal rate and regular rhythm.     Pulses: Normal pulses.     Heart sounds: Normal heart sounds.  Pulmonary:     Effort: Pulmonary effort is normal.     Breath sounds: Normal breath sounds.  Abdominal:     General: There is distension.     Tenderness: There is no abdominal tenderness. There is no guarding.     Hernia: A hernia is present.     Comments: Significant distention and large umbilical hernia (no skin discoloration or pain)  Musculoskeletal:     Comments: BLE 3+ edema with evidence of venous stasis dermatitis, no ulcerations noted  Skin:    General: Skin is warm and dry.  Neurological:     Mental Status: He is alert and oriented to person, place, and time.  Psychiatric:        Mood and Affect: Mood normal.        Behavior: Behavior normal.         Thought Content: Thought  content normal.        Judgment: Judgment normal.         Assessment & Plan:   Problem List Items Addressed This Visit     Hypertension (Chronic)    Blood pressure is at goal for age and co-morbidities.   Recommendations: continue propranolol 10 mg BID and olmesartan 40 mg daily - BP goal <130/80 - monitor and log blood pressures at home - check around the same time each day in a relaxed setting - Limit salt to <2000 mg/day - Follow DASH eating plan (heart healthy diet) - limit alcohol to 2 standard drinks per day for men and 1 per day for women - avoid tobacco products - get at least 2 hours of regular aerobic exercise weekly Patient aware of signs/symptoms requiring further/urgent evaluation. Labs updated today.       Relevant Medications   olmesartan (BENICAR) 40 MG tablet   propranolol (INDERAL) 10 MG tablet   Other Relevant Orders   CBC with Differential/Platelet   Comprehensive metabolic panel   TSH   Hyperlipidemia    Medication management: reports lifestyle management  Lifestyle factors for lowering cholesterol include: Diet therapy - heart-healthy diet rich in fruits, veggies, fiber-rich whole grains, lean meats, chicken, fish (at least twice a week), fat-free or 1% dairy products; foods low in saturated/trans fats, cholesterol, sodium, and sugar. Mediterranean diet has shown to be very heart healthy. Regular exercise - recommend at least 30 minutes a day, 5 times per week Weight management  Repeat CMP and lipid panel today       Relevant Medications   olmesartan (BENICAR) 40 MG tablet   propranolol (INDERAL) 10 MG tablet   Other Relevant Orders   Comprehensive metabolic panel   Lipid panel   GERD (gastroesophageal reflux disease)    Following with GI Well controlled with protonix and lifestyle measures      Relevant Medications   pantoprazole (PROTONIX) 40 MG tablet   Alcohol use    Reports sober for the past 3 months!  Encouraged avoidance of alcohol.       Hepatic cirrhosis (HCC)    Significant distention. Denies pain or other symptoms.  Following with GI Reports sober for the past 3 months      Umbilical hernia without obstruction and without gangrene    Very large umbilical hernia without acute signs of obstruction or infection Patient would like a second opinion as it is getting larger and annoying- referral placed Patient aware of signs/symptoms requiring further/urgent evaluation.       Relevant Orders   Ambulatory referral to General Surgery   Anxiety and depression    No SI/HI Refilling Remeron Encouraged supportive measures and counseling       Relevant Medications   mirtazapine (REMERON) 15 MG tablet   Prediabetes    Continue metformin Encouraged heart healthy low carb diet Labs today       Relevant Orders   Hemoglobin A1c   Other Visit Diagnoses     Encounter for medical examination to establish care    -  Primary       Return in about 3 months (around 03/24/2023) for routine follow-up.   Clayborne Dana, NP

## 2022-12-22 NOTE — Assessment & Plan Note (Signed)
Continue metformin Encouraged heart healthy low carb diet Labs today

## 2022-12-22 NOTE — Assessment & Plan Note (Signed)
No SI/HI Refilling Remeron Encouraged supportive measures and counseling

## 2022-12-22 NOTE — Assessment & Plan Note (Signed)
Following with GI Well controlled with protonix and lifestyle measures

## 2022-12-22 NOTE — Assessment & Plan Note (Signed)
Medication management: reports lifestyle management  Lifestyle factors for lowering cholesterol include: Diet therapy - heart-healthy diet rich in fruits, veggies, fiber-rich whole grains, lean meats, chicken, fish (at least twice a week), fat-free or 1% dairy products; foods low in saturated/trans fats, cholesterol, sodium, and sugar. Mediterranean diet has shown to be very heart healthy. Regular exercise - recommend at least 30 minutes a day, 5 times per week Weight management  Repeat CMP and lipid panel today

## 2023-01-10 ENCOUNTER — Emergency Department (HOSPITAL_COMMUNITY): Payer: 59

## 2023-01-10 ENCOUNTER — Inpatient Hospital Stay (HOSPITAL_COMMUNITY)
Admission: EM | Admit: 2023-01-10 | Discharge: 2023-01-14 | DRG: 433 | Disposition: A | Discharge status: Home / Self Care | Payer: 59 | Attending: Internal Medicine | Admitting: Internal Medicine

## 2023-01-10 ENCOUNTER — Other Ambulatory Visit: Payer: Self-pay

## 2023-01-10 ENCOUNTER — Encounter (HOSPITAL_COMMUNITY): Payer: Self-pay

## 2023-01-10 DIAGNOSIS — Z981 Arthrodesis status: Secondary | ICD-10-CM

## 2023-01-10 DIAGNOSIS — K7031 Alcoholic cirrhosis of liver with ascites: Principal | ICD-10-CM | POA: Diagnosis present

## 2023-01-10 DIAGNOSIS — Z881 Allergy status to other antibiotic agents status: Secondary | ICD-10-CM

## 2023-01-10 DIAGNOSIS — K7460 Unspecified cirrhosis of liver: Secondary | ICD-10-CM

## 2023-01-10 DIAGNOSIS — K3189 Other diseases of stomach and duodenum: Secondary | ICD-10-CM | POA: Diagnosis present

## 2023-01-10 DIAGNOSIS — M4317 Spondylolisthesis, lumbosacral region: Secondary | ICD-10-CM | POA: Diagnosis present

## 2023-01-10 DIAGNOSIS — F1020 Alcohol dependence, uncomplicated: Secondary | ICD-10-CM | POA: Diagnosis present

## 2023-01-10 DIAGNOSIS — K769 Liver disease, unspecified: Secondary | ICD-10-CM | POA: Diagnosis present

## 2023-01-10 DIAGNOSIS — E871 Hypo-osmolality and hyponatremia: Secondary | ICD-10-CM | POA: Diagnosis present

## 2023-01-10 DIAGNOSIS — E119 Type 2 diabetes mellitus without complications: Secondary | ICD-10-CM | POA: Diagnosis present

## 2023-01-10 DIAGNOSIS — I8510 Secondary esophageal varices without bleeding: Secondary | ICD-10-CM | POA: Diagnosis present

## 2023-01-10 DIAGNOSIS — M4186 Other forms of scoliosis, lumbar region: Secondary | ICD-10-CM | POA: Diagnosis present

## 2023-01-10 DIAGNOSIS — R601 Generalized edema: Secondary | ICD-10-CM | POA: Diagnosis present

## 2023-01-10 DIAGNOSIS — K7290 Hepatic failure, unspecified without coma: Secondary | ICD-10-CM | POA: Diagnosis present

## 2023-01-10 DIAGNOSIS — M1990 Unspecified osteoarthritis, unspecified site: Secondary | ICD-10-CM | POA: Diagnosis present

## 2023-01-10 DIAGNOSIS — R4182 Altered mental status, unspecified: Secondary | ICD-10-CM | POA: Diagnosis not present

## 2023-01-10 DIAGNOSIS — R109 Unspecified abdominal pain: Secondary | ICD-10-CM | POA: Diagnosis not present

## 2023-01-10 DIAGNOSIS — K766 Portal hypertension: Secondary | ICD-10-CM | POA: Diagnosis present

## 2023-01-10 DIAGNOSIS — K7682 Hepatic encephalopathy: Secondary | ICD-10-CM | POA: Diagnosis not present

## 2023-01-10 DIAGNOSIS — Z91040 Latex allergy status: Secondary | ICD-10-CM

## 2023-01-10 DIAGNOSIS — E785 Hyperlipidemia, unspecified: Secondary | ICD-10-CM | POA: Diagnosis present

## 2023-01-10 DIAGNOSIS — Z79899 Other long term (current) drug therapy: Secondary | ICD-10-CM

## 2023-01-10 DIAGNOSIS — Z825 Family history of asthma and other chronic lower respiratory diseases: Secondary | ICD-10-CM

## 2023-01-10 DIAGNOSIS — Z8249 Family history of ischemic heart disease and other diseases of the circulatory system: Secondary | ICD-10-CM

## 2023-01-10 DIAGNOSIS — Z87891 Personal history of nicotine dependence: Secondary | ICD-10-CM

## 2023-01-10 DIAGNOSIS — D539 Nutritional anemia, unspecified: Secondary | ICD-10-CM | POA: Diagnosis present

## 2023-01-10 DIAGNOSIS — K219 Gastro-esophageal reflux disease without esophagitis: Secondary | ICD-10-CM | POA: Diagnosis present

## 2023-01-10 DIAGNOSIS — I10 Essential (primary) hypertension: Secondary | ICD-10-CM | POA: Diagnosis present

## 2023-01-10 DIAGNOSIS — Z811 Family history of alcohol abuse and dependence: Secondary | ICD-10-CM

## 2023-01-10 DIAGNOSIS — W19XXXA Unspecified fall, initial encounter: Secondary | ICD-10-CM | POA: Diagnosis present

## 2023-01-10 DIAGNOSIS — N390 Urinary tract infection, site not specified: Secondary | ICD-10-CM | POA: Diagnosis present

## 2023-01-10 DIAGNOSIS — R258 Other abnormal involuntary movements: Secondary | ICD-10-CM | POA: Diagnosis present

## 2023-01-10 DIAGNOSIS — N62 Hypertrophy of breast: Secondary | ICD-10-CM | POA: Diagnosis present

## 2023-01-10 DIAGNOSIS — Z7409 Other reduced mobility: Secondary | ICD-10-CM | POA: Diagnosis present

## 2023-01-10 DIAGNOSIS — I700 Atherosclerosis of aorta: Secondary | ICD-10-CM | POA: Diagnosis not present

## 2023-01-10 DIAGNOSIS — R278 Other lack of coordination: Secondary | ICD-10-CM | POA: Diagnosis present

## 2023-01-10 DIAGNOSIS — Z7984 Long term (current) use of oral hypoglycemic drugs: Secondary | ICD-10-CM

## 2023-01-10 DIAGNOSIS — R0602 Shortness of breath: Secondary | ICD-10-CM | POA: Diagnosis not present

## 2023-01-10 DIAGNOSIS — Z801 Family history of malignant neoplasm of trachea, bronchus and lung: Secondary | ICD-10-CM

## 2023-01-10 DIAGNOSIS — Z83438 Family history of other disorder of lipoprotein metabolism and other lipidemia: Secondary | ICD-10-CM

## 2023-01-10 LAB — CBC
HCT: 37.9 % — ABNORMAL LOW (ref 39.0–52.0)
Hemoglobin: 12.9 g/dL — ABNORMAL LOW (ref 13.0–17.0)
MCH: 33.3 pg (ref 26.0–34.0)
MCHC: 34 g/dL (ref 30.0–36.0)
MCV: 97.9 fL (ref 80.0–100.0)
Platelets: 146 10*3/uL — ABNORMAL LOW (ref 150–400)
RBC: 3.87 MIL/uL — ABNORMAL LOW (ref 4.22–5.81)
RDW: 15.3 % (ref 11.5–15.5)
WBC: 5.6 10*3/uL (ref 4.0–10.5)
nRBC: 0 % (ref 0.0–0.2)

## 2023-01-10 LAB — URINALYSIS, ROUTINE W REFLEX MICROSCOPIC
Glucose, UA: NEGATIVE mg/dL
Hgb urine dipstick: NEGATIVE
Ketones, ur: 5 mg/dL — AB
Leukocytes,Ua: NEGATIVE
Nitrite: POSITIVE — AB
Protein, ur: 30 mg/dL — AB
Specific Gravity, Urine: 1.025 (ref 1.005–1.030)
pH: 5 (ref 5.0–8.0)

## 2023-01-10 LAB — COMPREHENSIVE METABOLIC PANEL
ALT: 23 U/L (ref 0–44)
AST: 61 U/L — ABNORMAL HIGH (ref 15–41)
Albumin: 2.5 g/dL — ABNORMAL LOW (ref 3.5–5.0)
Alkaline Phosphatase: 118 U/L (ref 38–126)
Anion gap: 9 (ref 5–15)
BUN: 16 mg/dL (ref 8–23)
CO2: 23 mmol/L (ref 22–32)
Calcium: 8.3 mg/dL — ABNORMAL LOW (ref 8.9–10.3)
Chloride: 97 mmol/L — ABNORMAL LOW (ref 98–111)
Creatinine, Ser: 1.06 mg/dL (ref 0.61–1.24)
GFR, Estimated: >60 mL/min (ref >60)
Glucose, Bld: 108 mg/dL — ABNORMAL HIGH (ref 70–99)
Potassium: 3.8 mmol/L (ref 3.5–5.1)
Sodium: 129 mmol/L — ABNORMAL LOW (ref 135–145)
Total Bilirubin: 5.1 mg/dL — ABNORMAL HIGH (ref 0.3–1.2)
Total Protein: 7.6 g/dL (ref 6.5–8.1)

## 2023-01-10 LAB — AMMONIA: Ammonia: 119 umol/L — ABNORMAL HIGH (ref 9–35)

## 2023-01-10 LAB — LIPASE, BLOOD: Lipase: 45 U/L (ref 11–51)

## 2023-01-10 LAB — ETHANOL: Alcohol, Ethyl (B): <10 mg/dL (ref <10)

## 2023-01-10 MED ORDER — IOHEXOL 300 MG/ML  SOLN
100.0000 mL | Freq: Once | INTRAMUSCULAR | Status: AC | PRN
  Administered 2023-01-10: 100 mL via INTRAVENOUS

## 2023-01-10 MED ORDER — LACTULOSE 10 GM/15ML PO SOLN
30.0000 g | Freq: Once | ORAL | Status: AC
  Administered 2023-01-10: 30 g via ORAL
  Filled 2023-01-10: qty 60

## 2023-01-10 NOTE — ED Provider Notes (Signed)
[place] EMERGENCY DEPARTMENT AT [facility] Provider Note   CSN: [id number] Arrival date & time: 01/10/23  1906     History  Chief Complaint  Patient presents with   Generalized Body Aches   Fall    WAH DILDINE is a 62 y.o. male.  Patient is a 62 year old male with a history of prior alcohol abuse, cirrhosis, hypertension, hyperlipidemia who is presenting today to the emergency room with multiple complaints.  Patient's complaints have all been going on for months and he cannot quite remember when it all started.  However he is not doing well at home.  Patient reports that he has minimal energy most of the time and it seems like he can only eat peanut butter and jelly sandwiches but his stomach will intermittently hurt he had goes between diarrhea and constipation but denies any nausea or vomiting.  His abdomen has continued to swell over months but cannot recall when it started.  He stopped drinking alcohol 3 to 4 months ago and has been compliant with his home medications.  He feels cold all the time but has not had a fever that he is aware of.  He reports in the last month when he tries to lay down he feels like he is wheezing and he has trouble catching his breath.  He also feels winded when he tries to get up and walk around.  He has had swelling of his feet for years and is unsure if it is any worse.  He recently establish care with a PCP last month but last saw GI and general surgery last year.  He does not use any Tylenol products and denies any recent medication changes.  He denies hitting his head that he is aware of has not been passing out but at times feels confused.  Patient does live at home alone and reports when he falls the neighbor will come over and help him because he cannot get off the floor on his own.  The history is provided by the patient.  Fall       Home Medications Prior to Admission medications   Medication Sig Start Date End Date Taking?  Authorizing Provider  metFORMIN (GLUCOPHAGE) 500 MG tablet Take 500 mg by mouth daily.   Yes [provider]  Multiple Vitamins-Minerals (MULTIVITAMIN WITH MINERALS) tablet Take 1 tablet by mouth daily.   Yes [provider]  olmesartan (BENICAR) 40 MG tablet Take 1 tablet (40 mg total) by mouth daily. 12/22/22  Yes Clayborne Dana, NP  pantoprazole (PROTONIX) 40 MG tablet Take 1 tablet (40 mg total) by mouth 2 (two) times daily before a meal. 12/22/22  Yes Clayborne Dana, NP  propranolol (INDERAL) 10 MG tablet Take 2 tablets in the morning and 1 tablet in the evening. KEEP YOUR December APPOINTMENT WITH DR. NANDIGAM FOR FURTHER REFILLS 12/22/22  Yes Hyman Hopes B, NP  mirtazapine (REMERON) 15 MG tablet Take 1 tablet (15 mg total) by mouth at bedtime. Patient not taking: Reported on 01/10/2023 12/22/22   Clayborne Dana, NP  olmesartan-hydrochlorothiazide (BENICAR HCT) 40-12.5 MG tablet Take 1 tablet by mouth daily. Patient not taking: Reported on 01/10/2023 12/13/22   [provider]      Allergies    Cephalexin, Doxycycline, and Latex    Review of Systems   Review of Systems  Physical Exam Updated Vital Signs BP 127/67   Pulse 74   Temp 97.8 F (36.6 C) (Oral)  Resp (!) 22   Ht 5\' 8"  (1.727 m)   Wt 97.5 kg   SpO2 98%   BMI 32.69 kg/m  Physical Exam Vitals and nursing note reviewed.  Constitutional:      General: He is not in acute distress.    Appearance: He is well-developed. He is ill-appearing.     Comments: Cachectic upper body  HENT:     Head: Normocephalic and atraumatic.     Mouth/Throat:     Mouth: Mucous membranes are dry.  Eyes:     General: Scleral icterus present.     Conjunctiva/sclera: Conjunctivae normal.     Pupils: Pupils are equal, round, and reactive to light.  Cardiovascular:     Rate and Rhythm: Normal rate and regular rhythm.     Heart sounds: No murmur heard. Pulmonary:     Effort: Pulmonary effort is normal. No respiratory  distress.     Breath sounds: Normal breath sounds. No wheezing or rales.  Abdominal:     General: There is distension.     Palpations: Abdomen is soft.     Tenderness: There is no abdominal tenderness. There is no guarding or rebound.     Comments: Severe abdominal distention with fluid wave, no focal tenderness.  Large umbilical hernia that is soft  Musculoskeletal:        General: No tenderness. Normal range of motion.     Cervical back: Normal range of motion and neck supple.     Right lower leg: Edema present.     Left lower leg: Edema present.     Comments: Skin changes consistent with chronic venous stasis in the lower extremities with 3+ pitting edema in bilateral lower extremities up to the knee  Skin:    General: Skin is warm and dry.     Findings: No erythema or rash.  Neurological:     Mental Status: He is alert and oriented to person, place, and time.     Comments: Minimal asterixis.  Generalized weakness in the lower extremities 4 out of 5 bilaterally.  5 out of 5 strength in the upper extremities and no pronator drift  Psychiatric:        Mood and Affect: Mood normal.        Behavior: Behavior normal.     ED Results / Procedures / Treatments   Labs (all labs ordered are listed, but only abnormal results are displayed) Labs Reviewed  COMPREHENSIVE METABOLIC PANEL - Abnormal; Notable for the following components:      Result Value   Sodium 129 (*)    Chloride 97 (*)    Glucose, Bld 108 (*)    Calcium 8.3 (*)    Albumin 2.5 (*)    AST 61 (*)    Total Bilirubin 5.1 (*)    All other components within normal limits  CBC - Abnormal; Notable for the following components:   RBC 3.87 (*)    Hemoglobin 12.9 (*)    HCT 37.9 (*)    Platelets 146 (*)    All other components within normal limits  URINALYSIS, ROUTINE W REFLEX MICROSCOPIC - Abnormal; Notable for the following components:   Color, Urine AMBER (*)    Bilirubin Urine MODERATE (*)    Ketones, ur 5 (*)     Protein, ur 30 (*)    Nitrite POSITIVE (*)    Bacteria, UA FEW (*)    All other components within normal limits  AMMONIA - Abnormal; Notable for the  following components:   Ammonia 119 (*)    All other components within normal limits  LIPASE, BLOOD  ETHANOL  TSH    EKG None  Radiology CT ABDOMEN PELVIS W CONTRAST  Result Date: 01/10/2023 CLINICAL DATA:  Abdominal pain and distention. EXAM: CT ABDOMEN AND PELVIS WITH CONTRAST TECHNIQUE: Multidetector CT imaging of the abdomen and pelvis was performed using the standard protocol following bolus administration of intravenous contrast. RADIATION DOSE REDUCTION: This exam was performed according to the departmental dose-optimization program which includes automated exposure control, adjustment of the mA and/or kV according to patient size and/or use of iterative reconstruction technique. CONTRAST:  OMNIPAQUE IOHEXOL 300 MG/ML  SOLN COMPARISON:  Abdominal MRI dated 04/01/2022, without and with contrast. FINDINGS: Lower chest: Mild elevation right hemidiaphragm with overlying subsegmental atelectasis. Lung bases are otherwise clear. No pleural effusion. The cardiac size is normal. There is moderate patchy three-vessel coronary artery calcification. No pericardial effusion. Hepatobiliary: The liver is cirrhotic. The main portal vein is within normal caliber limits. Parenchymal heterogeneity is noted similar to the prior MRI. A nonspecific hypodense lesion in left hepatic segment 2 is again noted measuring 1.5 cm. This is unchanged since 04/01/2022 but may warrant PET-CT evaluation as the lesion was of low signal on both T1 and T2 imaging. There appeared to be faint rim enhancement on MRI but no visible internal enhancement. There was only faint restricted diffusion. Gallbladder and bile ducts are unremarkable. Pancreas: No focal abnormality. 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 Spleen: No mass. Slightly prominent spleen 13.8 cm coronal. Splenorenal  varices. Adrenals/Urinary Tract: Adrenal glands are unremarkable. Kidneys are normal, without renal calculi, focal lesion, or hydronephrosis. Bladder is unremarkable for the degree of distention. Stomach/Bowel: Diffuse thickened folds in the stomach which could be gastritis or portal gastropathy. There is mild dilatation of mid abdominal jejunal segments up to 3.2 cm. No other bowel dilatation. There are thickened folds involving these segments, which could be due to enteritis, portal enteropathy, or reactive from the ascites. Rest of the small bowel is normal caliber. A transitional segment was not seen. This could be due to ileus or low-grade partial proximal small bowel obstruction. The large bowel is mostly contracted. There are thickened folds in the ascending colon which could be due to nondistention, colitis or portal colopathy. There is advanced left colonic diverticulosis without inflammatory changes. The appendix is normal caliber. Vascular/Lymphatic: Aortic atherosclerosis. No enlarged abdominal or pelvic lymph nodes. In addition to the splenorenal shunting there are perigastric and mesenteric varices consistent with portal hypertension. Reproductive: Enlarged prostate, 4.9 cm transverse. Other: Massive abdominopelvic ascites increased from 04/01/2022, with inward bowel displacement. There is a moderate-sized umbilical hernia containing fluid. Small inguinal fat hernias. There is diffuse body wall anasarca. Right-greater-than-left gynecomastia. Musculoskeletal: Dextroscoliosis and degenerative change lumbar spine. There is a chronic left L5 pars defect with trace L5-S1 spondylolisthesis. Multilevel acquired lumbar foraminal stenosis. Multilevel bridging enthesopathy of the lower T-spine. There is ankylosis over the anterior right SI joint. There are no aggressive bone lesions or acute skeletal abnormality. IMPRESSION: 1. Massive abdominopelvic ascites increased from 04/01/2022. 2. Cirrhotic liver with  portal hypertension and varices. 3. 1.5 cm nonspecific hypodense lesion in segment 2 of the liver, unchanged since 04/01/2022 but may warrant PET-CT follow-up. Slight rim enhancement noted on MRI. 4. Gastroenteritis versus changes due to portal hypertension, with mild dilatation of mid abdominal jejunal segments which could be due to ileus or low-grade partial proximal small bowel obstruction. 5. Thickened folds  in the ascending colon which could be due to nondistention, colitis or portal colopathy. 6. Diffuse body wall anasarca. 7. Moderate-sized umbilical hernia containing fluid. 8. Aortic and coronary artery atherosclerosis. 9. Prostatomegaly. 10. 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 Lumbar scoliosis and degenerative change. 11. Right-greater-than-left gynecomastia. Aortic Atherosclerosis (ICD10-I70.0). Electronically Signed   By: Almira Bar M.D.   On: 01/10/2023 22:21   DG Chest Port 1 View  Result Date: 01/10/2023 CLINICAL DATA:  Shortness of breath. EXAM: PORTABLE CHEST 1 VIEW COMPARISON:  Lung bases from abdominal CT earlier today. FINDINGS: Low lung volumes. Normal heart size for technique. No focal airspace disease, pulmonary edema, pleural effusion or pneumothorax. No acute osseous findings. IMPRESSION: Low lung volumes without acute process. Electronically Signed   By: Narda Rutherford M.D.   On: 01/10/2023 22:00   CT Head Wo Contrast  Result Date: 01/10/2023 CLINICAL DATA:  Mental status change, unknown cause EXAM: CT HEAD WITHOUT CONTRAST TECHNIQUE: Contiguous axial images were obtained from the base of the skull through the vertex without intravenous contrast. RADIATION DOSE REDUCTION: This exam was performed according to the departmental dose-optimization program which includes automated exposure control, adjustment of the mA and/or kV according to patient size and/or use of iterative reconstruction technique. COMPARISON:  None Available. FINDINGS: Brain: Brain  volume is normal for age. No intracranial hemorrhage, mass effect, or midline shift. No hydrocephalus. The basilar cisterns are patent. Mild periventricular chronic small vessel ischemia. No evidence of territorial infarct or acute ischemia. No extra-axial or intracranial fluid collection. Vascular: No hyperdense vessel or unexpected calcification. Skull: No fracture or focal lesion. Sinuses/Orbits: Partial opacification of lower mastoid air cells. Mucous retention cyst in the left maxillary sinus. Other: None. IMPRESSION: 1. No acute intracranial abnormality. 2. Mild chronic small vessel ischemia. Electronically Signed   By: Narda Rutherford M.D.   On: 01/10/2023 21:56    Procedures Procedures    Medications Ordered in ED Medications  lactulose (CHRONULAC) 10 GM/15ML solution 30 g (has no administration in time range)  iohexol (OMNIPAQUE) 300 MG/ML solution 100 mL (100 mLs Intravenous Contrast Given 01/10/23 2132)    ED Course/ Medical Decision Making/ A&P                             Medical Decision Making Amount and/or Complexity of Data Reviewed External Data Reviewed: notes. Labs: ordered. Decision-making details documented in ED Course. Radiology: ordered and independent interpretation performed. Decision-making details documented in ED Course.  Risk Prescription drug management. Decision regarding hospitalization.   Pt with multiple medical problems and comorbidities and presenting today with a complaint that caries a high risk for morbidity and mortality.  Patient here today with multiple complaints all which have been gradually worsening over a period of time.  Patient's exam is consistent with cirrhosis with significant ascites, jaundice and evidence of fluid overload.  Patient is not currently following with a GI provider and was last seen in September.  Patient has stopped drinking but concern today for mild hepatic encephalopathy as he reports being so weak he cannot walk or  stand for any period of time.  Feel that patient's ascites and distention are causing some of his issues with wheezing at night and mobility.  Low suspicion for acute infectious symptoms today.  Also concern for possible electrolyte abnormalities.  Also concern for possible development of cancer.  I independently interpreted patient's labs and UA today without acute findings,  lipase within normal limits, CMP with a hyponatremia of 129, normal creatinine and total bilirubin of 5.1 from 4.1 last month CBC with stable hemoglobin of 12.9, normal white count and platelets stable 146.  Ammonia, TSH are pending.  I have independently visualized and interpreted pt's images today.  Head CT today without evidence of bleed or mass.  Chest x-ray is clear.  CT abdomen pelvis with significant ascites no evidence of obstruction or mass.  Radiology reports no acute findings in the head or chest but on abdominal CT patient has massive abdominopelvic ascites, cirrhotic liver with portal hypertension and varices, 1.5 cm nonspecific lesion in the liver which is unchanged from September as well as gastroenteritis versus changes due to portal hypertension with mild dilation of the mid abdominal jejunal segments which could be due to ileus or low-grade partial small bowel obstruction, thickened folds in the ascending colon which could be due to nondistention, colitis or portal colopathy and diffuse anasarca.  Patient's ammonia level is 119.  Given patient's above findings, physical exam feel that he would benefit from large-volume paracentesis as well as medications such as spironolactone or something to help with his edema, dietary changes and patient started on lactulose.  He does report that he was having difficulty eating anything except peanut butter and jelly sandwiches and will have intermittent constipation and diarrhea concerned he may have a mild ileus but is not displaying frank symptoms of obstruction today.  Feel that patient  needs admission and hospitalist was consulted.  Findings discussed with the patient and he is comfortable with this plan.          Final Clinical Impression(s) / ED Diagnoses Final diagnoses:  Hepatic encephalopathy (HCC)  Ascites due to alcoholic cirrhosis [facility])    Rx / DC Orders ED Discharge Orders     None         Gwyneth Sprout, MD 01/10/23 2314

## 2023-01-10 NOTE — ED Notes (Addendum)
Patient also says he is feeling tired and fatigued. He feels scared. Abdomen is distended, but complains of no pain.

## 2023-01-10 NOTE — ED Triage Notes (Addendum)
Patient states he has been feeling unwell over the last few weeks with chills. States he has been falling down at home. Patient said he fell this morning at 1030. Did not hit his head, no LOC. No blood thinners.

## 2023-01-11 ENCOUNTER — Inpatient Hospital Stay (HOSPITAL_COMMUNITY): Payer: 59

## 2023-01-11 DIAGNOSIS — W19XXXA Unspecified fall, initial encounter: Secondary | ICD-10-CM | POA: Diagnosis not present

## 2023-01-11 DIAGNOSIS — Z79899 Other long term (current) drug therapy: Secondary | ICD-10-CM | POA: Diagnosis not present

## 2023-01-11 DIAGNOSIS — N62 Hypertrophy of breast: Secondary | ICD-10-CM | POA: Diagnosis not present

## 2023-01-11 DIAGNOSIS — K7460 Unspecified cirrhosis of liver: Secondary | ICD-10-CM

## 2023-01-11 DIAGNOSIS — I8510 Secondary esophageal varices without bleeding: Secondary | ICD-10-CM | POA: Diagnosis not present

## 2023-01-11 DIAGNOSIS — Z7984 Long term (current) use of oral hypoglycemic drugs: Secondary | ICD-10-CM | POA: Diagnosis not present

## 2023-01-11 DIAGNOSIS — K7682 Hepatic encephalopathy: Secondary | ICD-10-CM | POA: Diagnosis not present

## 2023-01-11 DIAGNOSIS — E119 Type 2 diabetes mellitus without complications: Secondary | ICD-10-CM | POA: Diagnosis not present

## 2023-01-11 DIAGNOSIS — K766 Portal hypertension: Secondary | ICD-10-CM | POA: Diagnosis not present

## 2023-01-11 DIAGNOSIS — E871 Hypo-osmolality and hyponatremia: Secondary | ICD-10-CM | POA: Diagnosis not present

## 2023-01-11 DIAGNOSIS — K7290 Hepatic failure, unspecified without coma: Secondary | ICD-10-CM

## 2023-01-11 DIAGNOSIS — M4186 Other forms of scoliosis, lumbar region: Secondary | ICD-10-CM | POA: Diagnosis not present

## 2023-01-11 DIAGNOSIS — M4317 Spondylolisthesis, lumbosacral region: Secondary | ICD-10-CM | POA: Diagnosis not present

## 2023-01-11 DIAGNOSIS — K219 Gastro-esophageal reflux disease without esophagitis: Secondary | ICD-10-CM | POA: Diagnosis not present

## 2023-01-11 DIAGNOSIS — F1020 Alcohol dependence, uncomplicated: Secondary | ICD-10-CM | POA: Diagnosis not present

## 2023-01-11 DIAGNOSIS — D539 Nutritional anemia, unspecified: Secondary | ICD-10-CM | POA: Diagnosis not present

## 2023-01-11 DIAGNOSIS — Z7409 Other reduced mobility: Secondary | ICD-10-CM | POA: Diagnosis present

## 2023-01-11 DIAGNOSIS — E785 Hyperlipidemia, unspecified: Secondary | ICD-10-CM | POA: Diagnosis not present

## 2023-01-11 DIAGNOSIS — K769 Liver disease, unspecified: Secondary | ICD-10-CM | POA: Diagnosis not present

## 2023-01-11 DIAGNOSIS — R278 Other lack of coordination: Secondary | ICD-10-CM | POA: Diagnosis not present

## 2023-01-11 DIAGNOSIS — I10 Essential (primary) hypertension: Secondary | ICD-10-CM | POA: Diagnosis not present

## 2023-01-11 DIAGNOSIS — R258 Other abnormal involuntary movements: Secondary | ICD-10-CM | POA: Diagnosis not present

## 2023-01-11 DIAGNOSIS — K3189 Other diseases of stomach and duodenum: Secondary | ICD-10-CM | POA: Diagnosis not present

## 2023-01-11 DIAGNOSIS — R601 Generalized edema: Secondary | ICD-10-CM | POA: Diagnosis not present

## 2023-01-11 DIAGNOSIS — R188 Other ascites: Secondary | ICD-10-CM | POA: Diagnosis not present

## 2023-01-11 DIAGNOSIS — N390 Urinary tract infection, site not specified: Secondary | ICD-10-CM | POA: Diagnosis not present

## 2023-01-11 DIAGNOSIS — M1990 Unspecified osteoarthritis, unspecified site: Secondary | ICD-10-CM | POA: Diagnosis not present

## 2023-01-11 DIAGNOSIS — K7031 Alcoholic cirrhosis of liver with ascites: Secondary | ICD-10-CM | POA: Diagnosis not present

## 2023-01-11 LAB — COMPREHENSIVE METABOLIC PANEL
ALT: 20 U/L (ref 0–44)
AST: 54 U/L — ABNORMAL HIGH (ref 15–41)
Albumin: 2.2 g/dL — ABNORMAL LOW (ref 3.5–5.0)
Alkaline Phosphatase: 93 U/L (ref 38–126)
Anion gap: 8 (ref 5–15)
BUN: 16 mg/dL (ref 8–23)
CO2: 25 mmol/L (ref 22–32)
Calcium: 8.3 mg/dL — ABNORMAL LOW (ref 8.9–10.3)
Chloride: 97 mmol/L — ABNORMAL LOW (ref 98–111)
Creatinine, Ser: 1.07 mg/dL (ref 0.61–1.24)
GFR, Estimated: >60 mL/min (ref >60)
Glucose, Bld: 116 mg/dL — ABNORMAL HIGH (ref 70–99)
Potassium: 3.7 mmol/L (ref 3.5–5.1)
Sodium: 130 mmol/L — ABNORMAL LOW (ref 135–145)
Total Bilirubin: 5.1 mg/dL — ABNORMAL HIGH (ref 0.3–1.2)
Total Protein: 7.2 g/dL (ref 6.5–8.1)

## 2023-01-11 LAB — BODY FLUID CELL COUNT WITH DIFFERENTIAL
Eos, Fluid: 0 %
Lymphs, Fluid: 53 %
Monocyte-Macrophage-Serous Fluid: 42 % — ABNORMAL LOW (ref 50–90)
Neutrophil Count, Fluid: 5 % (ref 0–25)
Total Nucleated Cell Count, Fluid: 113 cu mm (ref 0–1000)

## 2023-01-11 LAB — TSH: TSH: 2.055 u[IU]/mL (ref 0.350–4.500)

## 2023-01-11 LAB — GLUCOSE, CAPILLARY
Glucose-Capillary: 103 mg/dL — ABNORMAL HIGH (ref 70–99)
Glucose-Capillary: 140 mg/dL — ABNORMAL HIGH (ref 70–99)

## 2023-01-11 LAB — ALBUMIN, PLEURAL OR PERITONEAL FLUID: Albumin, Fluid: <1.5 g/dL

## 2023-01-11 LAB — PROTEIN, PLEURAL OR PERITONEAL FLUID: Total protein, fluid: <3 g/dL

## 2023-01-11 LAB — LACTATE DEHYDROGENASE, PLEURAL OR PERITONEAL FLUID: LD, Fluid: 57 U/L — ABNORMAL HIGH (ref 3–23)

## 2023-01-11 LAB — PROTIME-INR
INR: 1.8 — ABNORMAL HIGH (ref 0.8–1.2)
Prothrombin Time: 20.8 seconds — ABNORMAL HIGH (ref 11.4–15.2)

## 2023-01-11 LAB — HIV ANTIBODY (ROUTINE TESTING W REFLEX): HIV Screen 4th Generation wRfx: NONREACTIVE

## 2023-01-11 LAB — GLUCOSE, PLEURAL OR PERITONEAL FLUID: Glucose, Fluid: 123 mg/dL

## 2023-01-11 MED ORDER — PROPRANOLOL HCL 20 MG PO TABS
10.0000 mg | ORAL_TABLET | Freq: Two times a day (BID) | ORAL | Status: DC
  Administered 2023-01-11 – 2023-01-14 (×7): 10 mg via ORAL
  Filled 2023-01-11 (×7): qty 1

## 2023-01-11 MED ORDER — FUROSEMIDE 10 MG/ML IJ SOLN
40.0000 mg | Freq: Two times a day (BID) | INTRAMUSCULAR | Status: DC
  Administered 2023-01-11 – 2023-01-12 (×4): 40 mg via INTRAVENOUS
  Filled 2023-01-11 (×4): qty 4

## 2023-01-11 MED ORDER — ALBUMIN HUMAN 25 % IV SOLN: 25.0000 g | Freq: Once | INTRAVENOUS | Status: DC | PRN

## 2023-01-11 MED ORDER — NADOLOL 20 MG PO TABS
40.0000 mg | ORAL_TABLET | Freq: Every day | ORAL | Status: DC
  Filled 2023-01-11: qty 2

## 2023-01-11 MED ORDER — PANTOPRAZOLE SODIUM 40 MG PO TBEC
40.0000 mg | DELAYED_RELEASE_TABLET | Freq: Two times a day (BID) | ORAL | Status: DC
  Administered 2023-01-11 – 2023-01-14 (×7): 40 mg via ORAL
  Filled 2023-01-11 (×7): qty 1

## 2023-01-11 MED ORDER — FUROSEMIDE 40 MG PO TABS: 40.0000 mg | ORAL_TABLET | Freq: Every day | ORAL | Status: DC

## 2023-01-11 MED ORDER — INSULIN ASPART 100 UNIT/ML IJ SOLN: 0.0000 [IU] | Freq: Every day | INTRAMUSCULAR | Status: DC

## 2023-01-11 MED ORDER — FUROSEMIDE 10 MG/ML IJ SOLN: 40.0000 mg | Freq: Two times a day (BID) | INTRAMUSCULAR | Status: DC

## 2023-01-11 MED ORDER — SULFAMETHOXAZOLE-TRIMETHOPRIM 800-160 MG PO TABS
1.0000 | ORAL_TABLET | Freq: Two times a day (BID) | ORAL | Status: DC
  Administered 2023-01-11: 1 via ORAL
  Filled 2023-01-11 (×2): qty 1

## 2023-01-11 MED ORDER — ACETAMINOPHEN 325 MG PO TABS: 650.0000 mg | ORAL_TABLET | Freq: Four times a day (QID) | ORAL | Status: DC | PRN

## 2023-01-11 MED ORDER — ACETAMINOPHEN 650 MG RE SUPP: 650.0000 mg | Freq: Four times a day (QID) | RECTAL | Status: DC | PRN

## 2023-01-11 MED ORDER — INSULIN ASPART 100 UNIT/ML IJ SOLN
0.0000 [IU] | Freq: Three times a day (TID) | INTRAMUSCULAR | Status: DC
  Administered 2023-01-12 – 2023-01-14 (×5): 1 [IU] via SUBCUTANEOUS

## 2023-01-11 MED ORDER — LIDOCAINE HCL 1 % IJ SOLN
INTRAMUSCULAR | Status: AC
  Filled 2023-01-11: qty 20

## 2023-01-11 MED ORDER — LACTULOSE 10 GM/15ML PO SOLN
30.0000 g | Freq: Three times a day (TID) | ORAL | Status: DC
  Administered 2023-01-11 – 2023-01-14 (×10): 30 g via ORAL
  Filled 2023-01-11 (×6): qty 45
  Filled 2023-01-11: qty 60
  Filled 2023-01-11 (×3): qty 45

## 2023-01-11 MED ORDER — RIFAXIMIN 550 MG PO TABS
550.0000 mg | ORAL_TABLET | Freq: Two times a day (BID) | ORAL | Status: DC
  Administered 2023-01-11 – 2023-01-14 (×8): 550 mg via ORAL
  Filled 2023-01-11 (×9): qty 1

## 2023-01-11 MED ORDER — SPIRONOLACTONE 25 MG PO TABS
100.0000 mg | ORAL_TABLET | Freq: Every day | ORAL | Status: DC
  Filled 2023-01-11: qty 1

## 2023-01-11 NOTE — Procedures (Signed)
PROCEDURE SUMMARY:  Successful ultrasound guided paracentesis from the right lower  quadrant.  Yielded 6 liters of yellow fluid.  No immediate complications.  The patient tolerated the procedure well.   Specimen  was sent for labs.  EBL none  If the patient eventually requires >/=2 paracenteses in a 30 day period, screening evaluation by the [facility] will be assessed.   Nathan Roberts

## 2023-01-11 NOTE — Evaluation (Signed)
Physical Therapy Evaluation Only Patient Details Name: Nathan Roberts MRN: [id number] DOB: 08-02-1960 Today's Date: 01/11/2023  History of Present Illness  Nathan Roberts is a 62 y.o. male admitted with decompensated hepatic cirrhosis. Pt s/p Successful ultrasound guided paracentesis from the right lower  quadrant; yielded 6 liters of yellow fluid. 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  Clinical Impression  Pt from home, reports ind with community ambulation, works full time lifting and walking, is planning to move to [place] to be closer to family. Pt adamant he has not fallen at home, reports sometimes when cleaning things on the floor or picking something up he has difficulty standing back up from the floor and calls his neighbor for help. Pt repeats self multiple times regarding no falls at home. Pt states name, location, situation all correctly, initially states month is July then corrects to June and year 2024. Pt demo gait WFL, no overt LOB or unsteadiness noted. No acute PT needs identified, will sign off at this time.    Recommendations for follow up therapy are one component of a multi-disciplinary discharge planning process, led by the attending physician.  Recommendations may be updated based on patient status, additional functional criteria and insurance authorization.  Follow Up Recommendations       Assistance Recommended at Discharge    Patient can return home with the following       Equipment Recommendations None recommended by PT  Recommendations for Other Services       Functional Status Assessment Patient has not had a recent decline in their functional status     Precautions / Restrictions Restrictions Weight Bearing Restrictions: No      Mobility  Bed Mobility Overal bed mobility: Modified Independent                   Transfers Overall transfer level: Modified independent Equipment used: None                    Ambulation/Gait Ambulation/Gait assistance: Modified independent (Device/Increase time), Independent Gait Distance (Feet): 175 Feet Assistive device: None Gait Pattern/deviations: WFL(Within Functional Limits)       General Gait Details: pt amb with step through gait pattern, no veering or drifting, no LE buckling, good bil foot clearance, slightly increased BOS; pt adamant he is not falling at home, demos reaching to floor to retrieve item with hand on handrail in hallway and no LOB  Stairs            Wheelchair Mobility    Modified Rankin (Stroke Patients Only)       Balance Overall balance assessment: No apparent balance deficits (not formally assessed)                                           Pertinent Vitals/Pain Pain Assessment Pain Assessment: No/denies pain    Home Living Family/patient expects to be discharged to:: Private residence Living Arrangements: Alone Available Help at Discharge: Neighbor Type of Home: Other(Comment) (townhome) Home Access: Level entry     Alternate Level Stairs-Number of Steps: flight Home Layout: Two level;Bed/bath upstairs Home Equipment: Cane - single point Additional Comments: pt reports planning to move to [place] to be closer to family    Prior Function Prior Level of Function : Independent/Modified Independent;Working/employed;Driving  Mobility Comments: pt reports ind without AD, pt denies falls ADLs Comments: pt reports ind     Hand Dominance        Extremity/Trunk Assessment   Upper Extremity Assessment Upper Extremity Assessment: Overall WFL for tasks assessed    Lower Extremity Assessment Lower Extremity Assessment: Overall WFL for tasks assessed (AROM WFL, strength 4+/5 throughout, denies numbness/tingling)    Cervical / Trunk Assessment Cervical / Trunk  Assessment: Normal  Communication   Communication: No difficulties  Cognition Arousal/Alertness: Awake/alert Behavior During Therapy: WFL for tasks assessed/performed Overall Cognitive Status: Within Functional Limits for tasks assessed                                          General Comments      Exercises     Assessment/Plan    PT Assessment Patient does not need any further PT services  PT Problem List         PT Treatment Interventions      PT Goals (Current goals can be found in the Care Plan section)  Acute Rehab PT Goals Patient Stated Goal: agreeable to ambulate PT Goal Formulation: All assessment and education complete, DC therapy    Frequency       Co-evaluation               AM-PAC PT "6 Clicks" Mobility  Outcome Measure Help needed turning from your back to your side while in a flat bed without using bedrails?: None Help needed moving from lying on your back to sitting on the side of a flat bed without using bedrails?: None Help needed moving to and from a bed to a chair (including a wheelchair)?: None Help needed standing up from a chair using your arms (e.g., wheelchair or bedside chair)?: None Help needed to walk in hospital room?: None Help needed climbing 3-5 steps with a railing? : None 6 Click Score: 24    End of Session Equipment Utilized During Treatment: Gait belt Activity Tolerance: Patient tolerated treatment well Patient left: in bed;with call bell/phone within reach Nurse Communication: Mobility status PT Visit Diagnosis: Other abnormalities of gait and mobility (R26.89)    Time: 1531-1550 PT Time Calculation (min) (ACUTE ONLY): 19 min   Charges:   PT Evaluation $PT Eval Low Complexity: 1 Low          Tori Alika Saladin PT, DPT 01/11/23, 4:00 PM

## 2023-01-11 NOTE — Progress Notes (Signed)
Pt has arrived to unit via wheelchair, from the ED, with nurse at side. Warm, dry, no visible distress.

## 2023-01-11 NOTE — H&P (Signed)
History and Physical    Nathan Roberts QQV:[id number] DOB: September 17, 1960 DOA: 01/10/2023  PCP: Clayborne Dana, NP   Chief Complaint: confusion  HPI: Nathan Roberts is a 62 y.o. male with medical history significant of decompensated alcoholic cirrhosis complicated by esophageal varices, refractory ascites presents emergency department due to confusion and weight gain.  Patient states that last alcoholic beverage was 3 months ago.  He presented to the ER due to swelling in his feet and profound weight gain.  On arrival he was afebrile hemodynamically stable.  Labs were obtained which revealed sodium 129, AST 61, total bilirubin 5.1, hemoglobin 12.9, platelets 148.  Urinalysis concerning for infection.  Chest x-ray was obtained which showed low lung volumes.  CT abdomen pelvis demonstrated large volume ascites, cirrhosis, 1.5 cm hypodense lesion in segment 2, anasarca.  CT head showed no acute changes.  Patient was admitted for further workup.  On admission patient was found to have massive ascites with fluid wave, asterixis and clonus.  He was started on spironolactone, Lasix, lactulose and rifaximin   Review of Systems: Review of Systems  All other systems reviewed and are negative.    As per HPI otherwise 10 point review of systems negative.   Allergies  Allergen Reactions   Cephalexin Rash   Doxycycline Rash   Latex Hives and Rash    Past Medical History:  Diagnosis Date   Allergy    Arthritis    Carpal tunnel syndrome    Diabetes mellitus without complication (HCC)    Esophageal varices (HCC)    GERD (gastroesophageal reflux disease)    GI bleed    Hyperlipidemia    Hypertension    Portal hypertensive gastropathy (HCC)    Pre-diabetes     Past Surgical History:  Procedure Laterality Date   abscess     rectal area   CARPAL TUNNEL RELEASE     left   CARPAL TUNNEL RELEASE Right 07/07/2021   Procedure: RIGHT CARPAL TUNNEL RELEASE;  Surgeon: Cindee Salt, MD;  Location:  [facility];  Service: Orthopedics;  Laterality: Right;   CERVICAL FUSION     COLONOSCOPY WITH PROPOFOL N/A 05/29/2022   Procedure: COLONOSCOPY WITH PROPOFOL;  Surgeon: Napoleon Form, MD;  Location: [facility];  Service: Gastroenterology;  Laterality: N/A;   ESOPHAGEAL BANDING  10/07/2020   Procedure: ESOPHAGEAL BANDING;  Surgeon: Napoleon Form, MD;  Location: [facility];  Service: Endoscopy;;   ESOPHAGOGASTRODUODENOSCOPY (EGD) WITH PROPOFOL N/A 10/07/2020   Procedure: ESOPHAGOGASTRODUODENOSCOPY (EGD) WITH PROPOFOL;  Surgeon: Napoleon Form, MD;  Location: [facility];  Service: Endoscopy;  Laterality: N/A;   ESOPHAGOGASTRODUODENOSCOPY (EGD) WITH PROPOFOL N/A 01/18/2021   Procedure: ESOPHAGOGASTRODUODENOSCOPY (EGD) WITH PROPOFOL;  Surgeon: Napoleon Form, MD;  Location: [facility];  Service: Endoscopy;  Laterality: N/A;   ESOPHAGOGASTRODUODENOSCOPY (EGD) WITH PROPOFOL N/A 05/29/2022   Procedure: ESOPHAGOGASTRODUODENOSCOPY (EGD) WITH PROPOFOL;  Surgeon: Napoleon Form, MD;  Location: [facility];  Service: Gastroenterology;  Laterality: N/A;   FASCIECTOMY Left 02/10/2021   Procedure: FASCIECTOMY RING FINGER AND SMALL FINGER OF LEFT HAND;  Surgeon: Cindee Salt, MD;  Location: [facility];  Service: Orthopedics;  Laterality: Left;   FASCIECTOMY Right 07/07/2021   Procedure: FASCIECTOMY RIGHT 1ST WEB, RIGHT RING FINGER, RIGHT SMALL FINGER;  Surgeon: Cindee Salt, MD;  Location: [facility];  Service: Orthopedics;  Laterality: Right;   SPINE SURGERY       reports that he quit smoking about 26 years ago.  His smoking use included cigarettes. He has never used smokeless tobacco. He reports current alcohol use of about 9.0 standard drinks of alcohol per week. He reports that he does not use drugs.  Family History  Problem Relation Age of Onset   Early death Mother    Lung cancer Mother 77       lung--smoker   Alcohol abuse Mother     Atrial fibrillation Father    Heart disease Father    COPD Father    Hyperlipidemia Father    Hypertension Father    Arthritis Paternal Grandmother    Cancer Paternal Grandfather    Colon cancer Neg Hx    Esophageal cancer Neg Hx    Stomach cancer Neg Hx    Rectal cancer Neg Hx     Prior to Admission medications   Medication Sig Start Date End Date Taking? Authorizing Provider  metFORMIN (GLUCOPHAGE) 500 MG tablet Take 500 mg by mouth daily.   Yes [provider]  Multiple Vitamins-Minerals (MULTIVITAMIN WITH MINERALS) tablet Take 1 tablet by mouth daily.   Yes [provider]  olmesartan (BENICAR) 40 MG tablet Take 1 tablet (40 mg total) by mouth daily. 12/22/22  Yes Clayborne Dana, NP  pantoprazole (PROTONIX) 40 MG tablet Take 1 tablet (40 mg total) by mouth 2 (two) times daily before a meal. 12/22/22  Yes Clayborne Dana, NP  propranolol (INDERAL) 10 MG tablet Take 2 tablets in the morning and 1 tablet in the evening. KEEP YOUR December APPOINTMENT WITH DR. NANDIGAM FOR FURTHER REFILLS 12/22/22  Yes Hyman Hopes B, NP  mirtazapine (REMERON) 15 MG tablet Take 1 tablet (15 mg total) by mouth at bedtime. Patient not taking: Reported on 01/10/2023 12/22/22   Clayborne Dana, NP  olmesartan-hydrochlorothiazide (BENICAR HCT) 40-12.5 MG tablet Take 1 tablet by mouth daily. Patient not taking: Reported on 01/10/2023 12/13/22   [provider]    Physical Exam: Vitals:   01/10/23 2230 01/10/23 2300 01/10/23 2315 01/10/23 2317  BP: 116/67 128/85    Pulse: 86 77 73   Resp: (!) 24 (!) 37 (!) 22   Temp:    97.8 F (36.6 C)  TempSrc:    Oral  SpO2: 96% 98% 100%   Weight:      Height:       Physical Exam Constitutional:      Appearance: He is normal weight.  HENT:     Head: Normocephalic.     Nose: Nose normal.     Mouth/Throat:     Mouth: Mucous membranes are moist.     Pharynx: Oropharynx is clear.  Eyes:     Conjunctiva/sclera: Conjunctivae normal.      Pupils: Pupils are equal, round, and reactive to light.  Cardiovascular:     Rate and Rhythm: Normal rate and regular rhythm.  Pulmonary:     Effort: Pulmonary effort is normal.     Breath sounds: Normal breath sounds.  Abdominal:     General: Abdomen is flat. Bowel sounds are normal. There is distension.  Genitourinary:    Penis: Normal.   Musculoskeletal:        General: Normal range of motion.     Cervical back: Normal range of motion.  Skin:    General: Skin is warm.     Capillary Refill: Capillary refill takes less than 2 seconds.  Neurological:     General: No focal deficit present.     Mental Status: He is alert. Mental  status is at baseline.     Asterixis on exam, fluid wave large volume edema in legs   Labs on Admission: I have personally reviewed the patients's labs and imaging studies.  Assessment/Plan Principal Problem:   Decompensated hepatic cirrhosis (HCC)   # Decompensated alcoholic cirrhosis, POA, active - Refractory ascites - History of esophageal varices - Hepatic encephalopathy  Plan: Start spironolactone and Lasix Start beta-blocker Large-volume paracentesis with albumin ordered Lactulose 30 g 3 times daily Urinalysis shows concern for infection.  Will treat with UTI with bactrim CT showed concern for lesion in liver.  Will patient will need outpatient follow-up for further events imaging Patient qualifies for transplant referral  MELD 3.0: 20 at 03/23/2022 11:57 AM MELD-Na: 22 at 03/23/2022 11:57 AM Calculated from: Serum Creatinine: 0.56 mg/dL (Using min of 1 mg/dL) at 9/56/2130 86:57 AM Serum Sodium: 127 mEq/L at 03/23/2022 11:57 AM Total Bilirubin: 2.3 mg/dL at 8/46/9629 52:84 AM Serum Albumin: 3.1 g/dL at 1/32/4401 02:72 AM INR(ratio): 1.4 ratio at 03/23/2022 11:57 AM Age at listing (hypothetical): 42 years Sex: Male at 03/23/2022 11:57 AM   # GERD-continue Protonix   Admission status: Inpatient Med-Surg  Certification: The  appropriate patient status for this patient is INPATIENT. Inpatient status is judged to be reasonable and necessary in order to provide the required intensity of service to ensure the patient's safety. The patient's presenting symptoms, physical exam findings, and initial radiographic and laboratory data in the context of their chronic comorbidities is felt to place them at high risk for further clinical deterioration. Furthermore, it is not anticipated that the patient will be medically stable for discharge from the hospital within 2 midnights of admission.   * I certify that at the point of admission it is my clinical judgment that the patient will require inpatient hospital care spanning beyond 2 midnights from the point of admission due to high intensity of service, high risk for further deterioration and high frequency of surveillance required.Alan Mulder MD [facility] If 7PM-7AM, please contact night-coverage [email or web address]  01/11/2023, 12:08 AM

## 2023-01-11 NOTE — TOC CM/SW Note (Signed)
Transition of Care [facility]) - Inpatient Brief Assessment  Patient Details  Name: Nathan Roberts MRN: [id number] Date of Birth: [birth year]  Transition of Care [facility] CM/SW Contact:    Ewing Schlein, LCSW Phone Number: 01/11/2023, 12:45 PM  Clinical Narrative: Screening completed. No TOC needs identified at this time.  Transition of Care Asessment: Insurance and Status: Insurance coverage has been reviewed Patient has primary care physician: Yes Home environment has been reviewed: Resides with spouse Prior level of function:: Independent at baseline Prior/Current Home Services: No current home services Social Determinants of Health Reivew: SDOH reviewed no interventions necessary Readmission risk has been reviewed: Yes Transition of care needs: no transition of care needs at this time

## 2023-01-11 NOTE — ED Notes (Signed)
ED TO INPATIENT HANDOFF REPORT  ED Nurse Name and Phone #: Maddyx Vallie  S Name/Age/Gender Nathan Roberts 62 y.o. male Room/Bed: WA13/WA13  Code Status   Code Status: Full Code  Home/SNF/Other Home Patient oriented to: self, place, time, and situation Is this baseline? Yes   Triage Complete: Triage complete  Chief Complaint Decompensated hepatic cirrhosis (HCC) [K72.90, K74.60]  Triage Note Patient states he has been feeling unwell over the last few weeks with chills. States he has been falling down at home. Patient said he fell this morning at 1030. Did not hit his head, no LOC. No blood thinners.    Allergies Allergies  Allergen Reactions   Cephalexin Rash   Doxycycline Rash   Latex Hives and Rash    Level of Care/Admitting Diagnosis ED Disposition     ED Disposition  Admit   Condition  --   Comment  Hospital Area: [facility] [[id number]]  Level of Care: Med-Surg [16]  May admit patient to [facility] or [facility] if equivalent level of care is available:: Yes  Covid Evaluation: Asymptomatic - no recent exposure (last 10 days) testing not required  Diagnosis: Decompensated hepatic cirrhosis [facility]) [[id number]]  Admitting Physician: Alan Mulder [[id number]]  Attending Physician: Alan Mulder [phone]  Certification:: I certify this patient will need inpatient services for at least 2 midnights  Estimated Length of Stay: 4          B Medical/Surgery History Past Medical History:  Diagnosis Date   Allergy    Arthritis    Carpal tunnel syndrome    Diabetes mellitus without complication (HCC)    Esophageal varices (HCC)    GERD (gastroesophageal reflux disease)    GI bleed    Hyperlipidemia    Hypertension    Portal hypertensive gastropathy (HCC)    Pre-diabetes    Past Surgical History:  Procedure Laterality Date   abscess     rectal area   CARPAL TUNNEL RELEASE     left   CARPAL TUNNEL RELEASE Right 07/07/2021   Procedure:  RIGHT CARPAL TUNNEL RELEASE;  Surgeon: Cindee Salt, MD;  Location: [facility];  Service: Orthopedics;  Laterality: Right;   CERVICAL FUSION     COLONOSCOPY WITH PROPOFOL N/A 05/29/2022   Procedure: COLONOSCOPY WITH PROPOFOL;  Surgeon: Napoleon Form, MD;  Location: [facility];  Service: Gastroenterology;  Laterality: N/A;   ESOPHAGEAL BANDING  10/07/2020   Procedure: ESOPHAGEAL BANDING;  Surgeon: Napoleon Form, MD;  Location: [facility];  Service: Endoscopy;;   ESOPHAGOGASTRODUODENOSCOPY (EGD) WITH PROPOFOL N/A 10/07/2020   Procedure: ESOPHAGOGASTRODUODENOSCOPY (EGD) WITH PROPOFOL;  Surgeon: Napoleon Form, MD;  Location: [facility];  Service: Endoscopy;  Laterality: N/A;   ESOPHAGOGASTRODUODENOSCOPY (EGD) WITH PROPOFOL N/A 01/18/2021   Procedure: ESOPHAGOGASTRODUODENOSCOPY (EGD) WITH PROPOFOL;  Surgeon: Napoleon Form, MD;  Location: [facility];  Service: Endoscopy;  Laterality: N/A;   ESOPHAGOGASTRODUODENOSCOPY (EGD) WITH PROPOFOL N/A 05/29/2022   Procedure: ESOPHAGOGASTRODUODENOSCOPY (EGD) WITH PROPOFOL;  Surgeon: Napoleon Form, MD;  Location: [facility];  Service: Gastroenterology;  Laterality: N/A;   FASCIECTOMY Left 02/10/2021   Procedure: FASCIECTOMY RING FINGER AND SMALL FINGER OF LEFT HAND;  Surgeon: Cindee Salt, MD;  Location: [facility];  Service: Orthopedics;  Laterality: Left;   FASCIECTOMY Right 07/07/2021   Procedure: FASCIECTOMY RIGHT 1ST WEB, RIGHT RING FINGER, RIGHT SMALL FINGER;  Surgeon: Cindee Salt, MD;  Location: [facility];  Service: Orthopedics;  Laterality: Right;   SPINE  SURGERY       A IV Location/Drains/Wounds Patient Lines/Drains/Airways Status     Active Line/Drains/Airways     Name Placement date Placement time Site Days   Peripheral IV 01/10/23 20 G 1" Right Antecubital 01/10/23  2127  Antecubital  1   Open Drain Right;Anterior Hand  07/07/21  1041  Hand  553   Incision (Closed)  02/10/21 Hand Left 02/10/21  1104  -- 700   Incision (Closed) 07/07/21 Hand Right 07/07/21  0723  -- 553   Wound / Incision (Open or Dehisced) 11/27/17 Non-pressure wound Leg Right cellulitis, scabbed, red/pink, open area on RLE inside of leg 11/27/17  --  Leg  1871            Intake/Output Last 24 hours No intake or output data in the 24 hours ending 01/11/23 0119  Labs/Imaging Results for orders placed or performed during the hospital encounter of 01/10/23 (from the past 48 hour(s))  Lipase, blood     Status: None   Collection Time: 01/10/23  7:35 PM  Result Value Ref Range   Lipase 45 11 - 51 U/L    Comment: Performed at [facility], [address], [place], [place] [address]  Comprehensive metabolic panel     Status: Abnormal   Collection Time: 01/10/23  7:35 PM  Result Value Ref Range   Sodium 129 (L) 135 - 145 mmol/L   Potassium 3.8 3.5 - 5.1 mmol/L   Chloride 97 (L) 98 - 111 mmol/L   CO2 23 22 - 32 mmol/L   Glucose, Bld 108 (H) 70 - 99 mg/dL    Comment: Glucose reference range applies only to samples taken after fasting for at least 8 hours.   BUN 16 8 - 23 mg/dL   Creatinine, Ser 6.04 0.61 - 1.24 mg/dL   Calcium 8.3 (L) 8.9 - 10.3 mg/dL   Total Protein 7.6 6.5 - 8.1 g/dL   Albumin 2.5 (L) 3.5 - 5.0 g/dL   AST 61 (H) 15 - 41 U/L   ALT 23 0 - 44 U/L   Alkaline Phosphatase 118 38 - 126 U/L   Total Bilirubin 5.1 (H) 0.3 - 1.2 mg/dL   GFR, Estimated >54 >09 mL/min    Comment: (NOTE) Calculated using the CKD-EPI Creatinine Equation (2021)    Anion gap 9 5 - 15    Comment: Performed at [facility], [address], [place], [place] [address]  CBC     Status: Abnormal   Collection Time: 01/10/23  7:35 PM  Result Value Ref Range   WBC 5.6 4.0 - 10.5 K/uL   RBC 3.87 (L) 4.22 - 5.81 MIL/uL   Hemoglobin 12.9 (L) 13.0 - 17.0 g/dL   HCT 47.8 (L) 29.5 - 62.1 %   MCV 97.9 80.0 - 100.0 fL   MCH 33.3 26.0 - 34.0 pg   MCHC 34.0 30.0 -  36.0 g/dL   RDW 30.8 65.7 - 84.6 %   Platelets 146 (L) 150 - 400 K/uL   nRBC 0.0 0.0 - 0.2 %    Comment: Performed at [facility], [address] [place], [place] [address]  Urinalysis, Routine w reflex microscopic -Urine, Clean Catch     Status: Abnormal   Collection Time: 01/10/23  9:07 PM  Result Value Ref Range   Color, Urine AMBER (A) YELLOW    Comment: BIOCHEMICALS MAY BE AFFECTED BY COLOR   APPearance CLEAR CLEAR   Specific Gravity, Urine  1.025 1.005 - 1.030   pH 5.0 5.0 - 8.0   Glucose, UA NEGATIVE NEGATIVE mg/dL   Hgb urine dipstick NEGATIVE NEGATIVE   Bilirubin Urine MODERATE (A) NEGATIVE   Ketones, ur 5 (A) NEGATIVE mg/dL   Protein, ur 30 (A) NEGATIVE mg/dL   Nitrite POSITIVE (A) NEGATIVE   Leukocytes,Ua NEGATIVE NEGATIVE   RBC / HPF 0-5 0 - 5 RBC/hpf   WBC, UA 11-20 0 - 5 WBC/hpf   Bacteria, UA FEW (A) NONE SEEN   Squamous Epithelial / HPF 0-5 0 - 5 /HPF   Mucus PRESENT    Hyaline Casts, UA PRESENT     Comment: Performed at [facility], [address], [place], [place] [address]  Ammonia     Status: Abnormal   Collection Time: 01/10/23 10:08 PM  Result Value Ref Range   Ammonia 119 (H) 9 - 35 umol/L    Comment: Performed at [facility], [address], [place], [place] [address]  Ethanol     Status: None   Collection Time: 01/10/23 10:08 PM  Result Value Ref Range   Alcohol, Ethyl (B) <10 <10 mg/dL    Comment: (NOTE) Lowest detectable limit for serum alcohol is 10 mg/dL.  For medical purposes only. Performed at [facility], [address], [place], [place] [address]    CT ABDOMEN PELVIS W CONTRAST  Result Date: 01/10/2023 CLINICAL DATA:  Abdominal pain and distention. EXAM: CT ABDOMEN AND PELVIS WITH CONTRAST TECHNIQUE: Multidetector CT imaging of the abdomen and pelvis was performed using the standard protocol following bolus administration of intravenous contrast. RADIATION  DOSE REDUCTION: This exam was performed according to the departmental dose-optimization program which includes automated exposure control, adjustment of the mA and/or kV according to patient size and/or use of iterative reconstruction technique. CONTRAST:  OMNIPAQUE IOHEXOL 300 MG/ML  SOLN COMPARISON:  Abdominal MRI dated 04/01/2022, without and with contrast. FINDINGS: Lower chest: Mild elevation right hemidiaphragm with overlying subsegmental atelectasis. Lung bases are otherwise clear. No pleural effusion. The cardiac size is normal. There is moderate patchy three-vessel coronary artery calcification. No pericardial effusion. Hepatobiliary: The liver is cirrhotic. The main portal vein is within normal caliber limits. Parenchymal heterogeneity is noted similar to the prior MRI. A nonspecific hypodense lesion in left hepatic segment 2 is again noted measuring 1.5 cm. This is unchanged since 04/01/2022 but may warrant PET-CT evaluation as the lesion was of low signal on both T1 and T2 imaging. There appeared to be faint rim enhancement on MRI but no visible internal enhancement. There was only faint restricted diffusion. Gallbladder and bile ducts are unremarkable. Pancreas: No focal abnormality. 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 Spleen: No mass. Slightly prominent spleen 13.8 cm coronal. Splenorenal varices. Adrenals/Urinary Tract: Adrenal glands are unremarkable. Kidneys are normal, without renal calculi, focal lesion, or hydronephrosis. Bladder is unremarkable for the degree of distention. Stomach/Bowel: Diffuse thickened folds in the stomach which could be gastritis or portal gastropathy. There is mild dilatation of mid abdominal jejunal segments up to 3.2 cm. No other bowel dilatation. There are thickened folds involving these segments, which could be due to enteritis, portal enteropathy, or reactive from the ascites. Rest of the small bowel is normal caliber. A transitional segment was not seen.  This could be due to ileus or low-grade partial proximal small bowel obstruction. The large bowel is mostly contracted. There are thickened folds in the ascending colon which could be due to nondistention,  colitis or portal colopathy. There is advanced left colonic diverticulosis without inflammatory changes. The appendix is normal caliber. Vascular/Lymphatic: Aortic atherosclerosis. No enlarged abdominal or pelvic lymph nodes. In addition to the splenorenal shunting there are perigastric and mesenteric varices consistent with portal hypertension. Reproductive: Enlarged prostate, 4.9 cm transverse. Other: Massive abdominopelvic ascites increased from 04/01/2022, with inward bowel displacement. There is a moderate-sized umbilical hernia containing fluid. Small inguinal fat hernias. There is diffuse body wall anasarca. Right-greater-than-left gynecomastia. Musculoskeletal: Dextroscoliosis and degenerative change lumbar spine. There is a chronic left L5 pars defect with trace L5-S1 spondylolisthesis. Multilevel acquired lumbar foraminal stenosis. Multilevel bridging enthesopathy of the lower T-spine. There is ankylosis over the anterior right SI joint. There are no aggressive bone lesions or acute skeletal abnormality. IMPRESSION: 1. Massive abdominopelvic ascites increased from 04/01/2022. 2. Cirrhotic liver with portal hypertension and varices. 3. 1.5 cm nonspecific hypodense lesion in segment 2 of the liver, unchanged since 04/01/2022 but may warrant PET-CT follow-up. Slight rim enhancement noted on MRI. 4. Gastroenteritis versus changes due to portal hypertension, with mild dilatation of mid abdominal jejunal segments which could be due to ileus or low-grade partial proximal small bowel obstruction. 5. Thickened folds in the ascending colon which could be due to nondistention, colitis or portal colopathy. 6. Diffuse body wall anasarca. 7. Moderate-sized umbilical hernia containing fluid. 8. Aortic and coronary  artery atherosclerosis. 9. Prostatomegaly. 10. 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 Lumbar scoliosis and degenerative change. 11. Right-greater-than-left gynecomastia. Aortic Atherosclerosis (ICD10-I70.0). Electronically Signed   By: Almira Bar M.D.   On: 01/10/2023 22:21   DG Chest Port 1 View  Result Date: 01/10/2023 CLINICAL DATA:  Shortness of breath. EXAM: PORTABLE CHEST 1 VIEW COMPARISON:  Lung bases from abdominal CT earlier today. FINDINGS: Low lung volumes. Normal heart size for technique. No focal airspace disease, pulmonary edema, pleural effusion or pneumothorax. No acute osseous findings. IMPRESSION: Low lung volumes without acute process. Electronically Signed   By: Narda Rutherford M.D.   On: 01/10/2023 22:00   CT Head Wo Contrast  Result Date: 01/10/2023 CLINICAL DATA:  Mental status change, unknown cause EXAM: CT HEAD WITHOUT CONTRAST TECHNIQUE: Contiguous axial images were obtained from the base of the skull through the vertex without intravenous contrast. RADIATION DOSE REDUCTION: This exam was performed according to the departmental dose-optimization program which includes automated exposure control, adjustment of the mA and/or kV according to patient size and/or use of iterative reconstruction technique. COMPARISON:  None Available. FINDINGS: Brain: Brain volume is normal for age. No intracranial hemorrhage, mass effect, or midline shift. No hydrocephalus. The basilar cisterns are patent. Mild periventricular chronic small vessel ischemia. No evidence of territorial infarct or acute ischemia. No extra-axial or intracranial fluid collection. Vascular: No hyperdense vessel or unexpected calcification. Skull: No fracture or focal lesion. Sinuses/Orbits: Partial opacification of lower mastoid air cells. Mucous retention cyst in the left maxillary sinus. Other: None. IMPRESSION: 1. No acute intracranial abnormality. 2. Mild chronic small vessel ischemia.  Electronically Signed   By: Narda Rutherford M.D.   On: 01/10/2023 21:56    Pending Labs Unresulted Labs (From admission, onward)     Start     Ordered   01/11/23 0500  Comprehensive metabolic panel  Tomorrow morning,   R        01/11/23 0005   01/11/23 0500  Protime-INR  Tomorrow morning,   R        01/11/23 0005   01/11/23 0003  HIV Antibody (  routine testing w rflx)  (HIV Antibody (Routine testing w reflex) panel)  Once,   R        01/11/23 0005   01/10/23 2119  TSH  Once,   URGENT        01/10/23 2121            Vitals/Pain Today's Vitals   01/10/23 2230 01/10/23 2300 01/10/23 2315 01/10/23 2317  BP: 116/67 128/85    Pulse: 86 77 73   Resp: (!) 24 (!) 37 (!) 22   Temp:    97.8 F (36.6 C)  TempSrc:    Oral  SpO2: 96% 98% 100%   Weight:      Height:      PainSc:        Isolation Precautions No active isolations  Medications Medications  pantoprazole (PROTONIX) EC tablet 40 mg (has no administration in time range)  acetaminophen (TYLENOL) tablet 650 mg (has no administration in time range)    Or  acetaminophen (TYLENOL) suppository 650 mg (has no administration in time range)  lactulose (CHRONULAC) 10 GM/15ML solution 30 g (30 g Oral Given 01/11/23 0050)  albumin human 25 % solution 25 g (has no administration in time range)  spironolactone (ALDACTONE) tablet 100 mg (has no administration in time range)  furosemide (LASIX) tablet 40 mg (has no administration in time range)  nadolol (CORGARD) tablet 40 mg (has no administration in time range)  rifaximin (XIFAXAN) tablet 550 mg (550 mg Oral Given 01/11/23 0050)  sulfamethoxazole-trimethoprim (BACTRIM DS) 800-160 MG per tablet 1 tablet (1 tablet Oral Given 01/11/23 0050)  iohexol (OMNIPAQUE) 300 MG/ML solution 100 mL (100 mLs Intravenous Contrast Given 01/10/23 2132)  lactulose (CHRONULAC) 10 GM/15ML solution 30 g (30 g Oral Given 01/10/23 2314)    Mobility walks with person assist       R Recommendations: See  Admitting Provider Note  Report given to:   Additional Notes:

## 2023-01-11 NOTE — Progress Notes (Signed)
PROGRESS NOTE  ABDULRAHMAN BRACEY  DOB: May 19, 1961  PCP: Clayborne Dana, NP ZOX:[id number]  DOA: 01/10/2023  LOS: 0 days  Hospital Day: 2  Brief narrative: SERAPIO EDELSON is a 62 y.o. male with PMH significant for DM2, HTN, HLD, chronic alcoholism, decompensated alcoholic cirrhosis complicated by esophageal varices, refractory ascites, portal hypertensive gastropathy, GI bleed. 6/12, patient presented to the ED with complaint of progressively worsening bilateral lower extremity edema, weight gain and worsening confusion Reports last alcohol drink was 3 months ago  In the ED, patient was afebrile and hemodynamically stable On exam he was noted to have asterixis and clonus Labs showed normal WBC count at 5.6, hemoglobin 12.9, sodium 129, creatinine normal, lipase normal, total bilirubin elevated to 5.1, INR 1.8 Urinalysis with amber-colored clear urine with positive nitrite, few bacteria Blood alcohol level not elevated CT did not show any acute abnormality, showed mild chronic ischemic changes CT abdomen pelvis showed significant findings related to liver cirrhosis and its sequelae as below Massive abdominopelvic ascites increased from 04/01/2022. Cirrhotic liver with portal hypertension and varices. 1.5 cm nonspecific hypodense lesion in segment 2 of the liver, unchanged since 04/01/2022 but may warrant PET-CT follow-up. Slight rim enhancement noted on MRI. Gastroenteritis versus changes due to portal hypertension, with mild dilatation of mid abdominal jejunal segments which could be due to ileus or low-grade partial proximal small bowel obstruction. Thickened folds in the ascending colon which could be due to nondistention, colitis or portal colopathy. Diffuse body wall anasarca. Right-greater-than-left gynecomastia.  Admitted to [facility]  Subjective: Patient was seen and examined this morning.  Elderly Caucasian male.  Lying down in bed.  Alert, awake, mental status clearing up.  No  family at bedside. Chart reviewed Remains hemodynamically stable Labs from this morning with sodium 130  Assessment and plan: Decompensated alcoholic cirrhosis, POA Large volume abdominopelvic ascites Ultrasound paracentesis ordered with albumin Reports patient never had paracentesis in the past. Recent Labs  Lab 01/10/23 1935 01/11/23 0340  AST 61* 54*  ALT 23 20  ALKPHOS 118 93  BILITOT 5.1* 5.1*  PROT 7.6 7.2  ALBUMIN 2.5* 2.2*    Diffuse anasarca Portal hypertension Essential hypertension PTA on propranolol 20 mg a.m. and 10 mg p.m. as well as olmesartan 40 mg daily.  I do not see Lasix or Aldactone on his home med list Start IV Lasix 40 mg twice daily.  Continue propranolol.  Hepatic encephalopathy Altered mental status due to elevated ammonia level Start lactulose 30 mg daily and rifaximin. Recent Labs  Lab 01/10/23 2208  AMMONIA 119*   Hyponatremia Secondary to portal hypertension Continue to monitor Recent Labs  Lab 01/10/23 1935 01/11/23 0340  NA 129* 130*    Chronic macrocytic anemia History of esophageal varices, portal hypertensive gastropathy, portal colopathy Hemoglobin stable at this time.  Continue monitor Recent Labs    03/23/22 1157 12/22/22 1101 01/10/23 1935  HGB 12.0* 12.3* 12.9*  MCV 91.5 100.5* 97.9   Hypodense liver lesion No changes in last 10 months. Continue to follow-up as an outpatient  Type 2 diabetes mellitus A1c 4.8 on 12/22/2022 PTA on metformin 500 mg daily. I will stop it at this time Start sliding scale insulin with Accu-Chek No results for input(s): "GLUCAP" in the last 168 hours.  Chronic left L5 pars defect with trace L5-S1 spondylolisthesis Lumbar scoliosis and degenerative change Impaired mobility PT eval   Goals of care   Code Status: Full Code     DVT prophylaxis:  SCDs  Start: 01/11/23 0004   Antimicrobials: none Fluid: None Consultants: None Family Communication: None at bedside  Status:  Inpatient Level of care:  Med-Surg   Patient from: Home Anticipated d/c to: Pending clinical course Needs to continue in-hospital care:  Needs paracentesis.    Diet:  Diet Order             Diet 2 gram sodium Room service appropriate? Yes; Fluid consistency: Thin  Diet effective now                   Scheduled Meds:  furosemide  40 mg Intravenous BID   insulin aspart  0-5 Units Subcutaneous QHS   insulin aspart  0-9 Units Subcutaneous TID WC   lactulose  30 g Oral TID   pantoprazole  40 mg Oral BID AC   propranolol  10 mg Oral BID   rifaximin  550 mg Oral BID    PRN meds: acetaminophen **OR** acetaminophen, albumin human   Infusions:   albumin human      Antimicrobials: Anti-infectives (From admission, onward)    Start     Dose/Rate Route Frequency Ordered Stop   01/11/23 0015  rifaximin (XIFAXAN) tablet 550 mg        550 mg Oral 2 times daily 01/11/23 0011     01/11/23 0015  sulfamethoxazole-trimethoprim (BACTRIM DS) 800-160 MG per tablet 1 tablet  Status:  Discontinued        1 tablet Oral Every 12 hours 01/11/23 0013 01/11/23 0844       Nutritional status:  Body mass index is 32.69 kg/m.          Objective: Vitals:   01/11/23 0200 01/11/23 0230  BP: 120/66 111/60  Pulse: 72 83  Resp: 20 16  Temp:  97.7 F (36.5 C)  SpO2: 96% 100%   No intake or output data in the 24 hours ending 01/11/23 1341 Filed Weights   01/10/23 1924  Weight: 97.5 kg   Weight change:  Body mass index is 32.69 kg/m.   Physical Exam: General exam: Pleasant Skin: No rashes, lesions or ulcers. HEENT: Atraumatic, normocephalic, no obvious bleeding Lungs: Clear to auscultation bilaterally CVS: Regular rate and rhythm, no murmur GI/Abd soft, distended from ascites.  Umbilical hernia noted CNS: Alert, awake, oriented x 3 Psychiatry: Sad affect Extremities: 1+ bilateral pedal edema  Data Review: I have personally reviewed the laboratory data and studies  available.  F/u labs ordered Unresulted Labs (From admission, onward)     Start     Ordered   01/12/23 0500  CBC with Differential/Platelet  Daily,   R      01/11/23 1341   01/12/23 0500  Comprehensive metabolic panel  Daily,   R      01/11/23 1341   01/12/23 0500  Ammonia  Daily,   R      01/11/23 1341            Total time spent in review of labs and imaging, patient evaluation, formulation of plan, documentation and communication with family: 65 minutes  Signed, Lorin Glass, MD [facility] 01/11/2023

## 2023-01-12 DIAGNOSIS — K7290 Hepatic failure, unspecified without coma: Secondary | ICD-10-CM | POA: Diagnosis not present

## 2023-01-12 DIAGNOSIS — K7460 Unspecified cirrhosis of liver: Secondary | ICD-10-CM | POA: Diagnosis not present

## 2023-01-12 LAB — CBC WITH DIFFERENTIAL/PLATELET
Abs Immature Granulocytes: 0.01 10*3/uL (ref 0.00–0.07)
Basophils Absolute: 0 10*3/uL (ref 0.0–0.1)
Basophils Relative: 0 %
Eosinophils Absolute: 0.1 10*3/uL (ref 0.0–0.5)
Eosinophils Relative: 3 %
HCT: 29.3 % — ABNORMAL LOW (ref 39.0–52.0)
Hemoglobin: 10 g/dL — ABNORMAL LOW (ref 13.0–17.0)
Immature Granulocytes: 0 %
Lymphocytes Relative: 33 %
Lymphs Abs: 1.5 10*3/uL (ref 0.7–4.0)
MCH: 32.8 pg (ref 26.0–34.0)
MCHC: 34.1 g/dL (ref 30.0–36.0)
MCV: 96.1 fL (ref 80.0–100.0)
Monocytes Absolute: 1 10*3/uL (ref 0.1–1.0)
Monocytes Relative: 21 %
Neutro Abs: 2 10*3/uL (ref 1.7–7.7)
Neutrophils Relative %: 43 %
Platelets: 102 10*3/uL — ABNORMAL LOW (ref 150–400)
RBC: 3.05 MIL/uL — ABNORMAL LOW (ref 4.22–5.81)
RDW: 15.6 % — ABNORMAL HIGH (ref 11.5–15.5)
WBC: 4.6 10*3/uL (ref 4.0–10.5)
nRBC: 0 % (ref 0.0–0.2)

## 2023-01-12 LAB — COMPREHENSIVE METABOLIC PANEL
ALT: 17 U/L (ref 0–44)
AST: 57 U/L — ABNORMAL HIGH (ref 15–41)
Albumin: 1.9 g/dL — ABNORMAL LOW (ref 3.5–5.0)
Alkaline Phosphatase: 97 U/L (ref 38–126)
Anion gap: 7 (ref 5–15)
BUN: 14 mg/dL (ref 8–23)
CO2: 25 mmol/L (ref 22–32)
Calcium: 7.7 mg/dL — ABNORMAL LOW (ref 8.9–10.3)
Chloride: 101 mmol/L (ref 98–111)
Creatinine, Ser: 0.82 mg/dL (ref 0.61–1.24)
GFR, Estimated: >60 mL/min (ref >60)
Glucose, Bld: 119 mg/dL — ABNORMAL HIGH (ref 70–99)
Potassium: 3.6 mmol/L (ref 3.5–5.1)
Sodium: 133 mmol/L — ABNORMAL LOW (ref 135–145)
Total Bilirubin: 2.8 mg/dL — ABNORMAL HIGH (ref 0.3–1.2)
Total Protein: 5.7 g/dL — ABNORMAL LOW (ref 6.5–8.1)

## 2023-01-12 LAB — GLUCOSE, CAPILLARY
Glucose-Capillary: 100 mg/dL — ABNORMAL HIGH (ref 70–99)
Glucose-Capillary: 126 mg/dL — ABNORMAL HIGH (ref 70–99)
Glucose-Capillary: 119 mg/dL — ABNORMAL HIGH (ref 70–99)
Glucose-Capillary: 133 mg/dL — ABNORMAL HIGH (ref 70–99)

## 2023-01-12 LAB — AMMONIA: Ammonia: 74 umol/L — ABNORMAL HIGH (ref 9–35)

## 2023-01-12 MED ORDER — AMOXICILLIN 500 MG PO CAPS
500.0000 mg | ORAL_CAPSULE | Freq: Three times a day (TID) | ORAL | Status: DC
  Administered 2023-01-12 – 2023-01-14 (×7): 500 mg via ORAL
  Filled 2023-01-12 (×8): qty 1

## 2023-01-12 NOTE — Progress Notes (Signed)
Mobility Specialist - Progress Note   01/12/23 0937  Mobility  Activity Ambulated independently in hallway  Level of Assistance Independent  Assistive Device None  Distance Ambulated (ft) 350 ft  Activity Response Tolerated well  Mobility Referral Yes  $Mobility charge 1 Mobility  Mobility Specialist Start Time (ACUTE ONLY) [id number]  Mobility Specialist Stop Time (ACUTE ONLY) 0933  Mobility Specialist Time Calculation (min) (ACUTE ONLY) 7 min   Pt received in bed and agreeable to mobility. No complaints during session. Pt to bed after session with all needs met.   [facility]

## 2023-01-12 NOTE — Progress Notes (Signed)
PROGRESS NOTE  ZAKARIE GIRGENTI  DOB: February 10, 1961  PCP: Clayborne Dana, NP ZHY:[id number]  DOA: 01/10/2023  LOS: 1 day  Hospital Day: 3  Brief narrative: Nathan Roberts is a 62 y.o. male with PMH significant for DM2, HTN, HLD, chronic alcoholism, decompensated alcoholic cirrhosis complicated by esophageal varices, refractory ascites, portal hypertensive gastropathy, GI bleed. 6/12, patient presented to the ED with complaint of progressively worsening bilateral lower extremity edema, weight gain and worsening confusion Reports last alcohol drink was 3 months ago  In the ED, patient was afebrile and hemodynamically stable On exam he was noted to have asterixis and clonus Labs showed normal WBC count at 5.6, hemoglobin 12.9, sodium 129, creatinine normal, lipase normal, total bilirubin elevated to 5.1, INR 1.8 Urinalysis with amber-colored clear urine with positive nitrite, few bacteria Blood alcohol level not elevated CT did not show any acute abnormality, showed mild chronic ischemic changes CT abdomen pelvis showed significant findings related to liver cirrhosis and its sequelae as below Massive abdominopelvic ascites increased from 04/01/2022. Cirrhotic liver with portal hypertension and varices. 1.5 cm nonspecific hypodense lesion in segment 2 of the liver, unchanged since 04/01/2022 but may warrant PET-CT follow-up. Slight rim enhancement noted on MRI. Gastroenteritis versus changes due to portal hypertension, with mild dilatation of mid abdominal jejunal segments which could be due to ileus or low-grade partial proximal small bowel obstruction. Thickened folds in the ascending colon which could be due to nondistention, colitis or portal colopathy. Diffuse body wall anasarca. Right-greater-than-left gynecomastia.  Admitted to [facility]  Subjective: Patient was seen and examined this morning.   Sitting up at the edge of the bed.  Feels better after 6 L ascites tapped yesterday.   Still  has a lot remaining.  Mental status better.  Assessment and plan: Decompensated alcoholic cirrhosis, POA Large volume abdominopelvic ascites Ultrasound paracentesis done yesterday, 6 L removed.  Still has significant retention. I ordered for repeat paracentesis tomorrow Reports patient never had paracentesis in the past. Recent Labs  Lab 01/10/23 1935 01/11/23 0340 01/12/23 0327  AST 61* 54* 57*  ALT 23 20 17   ALKPHOS 118 93 97  BILITOT 5.1* 5.1* 2.8*  PROT 7.6 7.2 5.7*  ALBUMIN 2.5* 2.2* 1.9*     Diffuse anasarca Portal hypertension Essential hypertension PTA on propranolol 20 mg a.m. and 10 mg p.m. as well as olmesartan 40 mg daily.  I do not see Lasix or Aldactone on his home med list Currently on IV Lasix 40 mg twice daily.  Continue propranolol. Blood pressure soft with creatinine stable. Recent Labs    03/23/22 1157 12/22/22 1101 01/10/23 1935 01/11/23 0340 01/12/23 0327  BUN 4* 11 16 16 14   CREATININE 0.56 0.76 1.06 1.07 0.82   Hepatic encephalopathy Altered mental status due to elevated ammonia level Start lactulose 30 mg daily and rifaximin. Recent Labs  Lab 01/10/23 2208 01/12/23 0327  AMMONIA 119* 74*    Hyponatremia Secondary to portal hypertension Continue to monitor Recent Labs  Lab 01/10/23 1935 01/11/23 0340 01/12/23 0327  NA 129* 130* 133*     Chronic macrocytic anemia History of esophageal varices, portal hypertensive gastropathy, portal colopathy Hemoglobin stable at this time.  Continue monitor Recent Labs    03/23/22 1157 12/22/22 1101 01/10/23 1935 01/12/23 0612  HGB 12.0* 12.3* 12.9* 10.0*  MCV 91.5 100.5* 97.9 96.1    Hypodense liver lesion No changes in last 10 months. Continue to follow-up as an outpatient  Type 2 diabetes mellitus A1c 4.8  on 12/22/2022 PTA on metformin 500 mg daily.  Currently not on it. 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  Hopefully home tomorrow after paracentesis    Diet:  Diet Order             Diet 2 gram sodium Room service appropriate? Yes; Fluid consistency: Thin  Diet effective now                   Scheduled Meds:  amoxicillin  500 mg Oral Q8H   furosemide  40 mg Intravenous BID   insulin aspart  0-5 Units Subcutaneous QHS   insulin aspart  0-9 Units Subcutaneous TID WC   lactulose  30 g Oral TID   pantoprazole  40 mg Oral BID AC   propranolol  10 mg Oral BID   rifaximin  550 mg Oral BID    PRN meds: acetaminophen **OR** acetaminophen, albumin human   Infusions:   albumin human      Antimicrobials: Anti-infectives (From admission, onward)    Start     Dose/Rate Route Frequency Ordered Stop   01/12/23 0830  amoxicillin (AMOXIL) capsule 500 mg        500 mg Oral Every 8 hours 01/12/23 0739 01/17/23 0559   01/11/23 0015  rifaximin (XIFAXAN) tablet 550 mg        550 mg Oral 2 times daily 01/11/23 0011     01/11/23 0015  sulfamethoxazole-trimethoprim (BACTRIM DS) 800-160 MG per tablet 1 tablet  Status:  Discontinued        1 tablet Oral Every 12 hours 01/11/23 0013 01/11/23 0844       Nutritional status:  Body mass index is 32.69 kg/m.          Objective: Vitals:   01/12/23 0616 01/12/23 1340  BP: 94/60 115/75  Pulse: 71 71  Resp: 18 16  Temp: 98.1 F (36.7 C) 98.3 F (36.8 C)   SpO2: 94% 97%    Intake/Output Summary (Last 24 hours) at 01/12/2023 1349 Last data filed at 01/12/2023 0600 Gross per 24 hour  Intake 300 ml  Output --  Net 300 ml   Filed Weights   01/10/23 1924  Weight: 97.5 kg   Weight change:  Body mass index is 32.69 kg/m.   Physical Exam: General exam: Pleasant Skin: No rashes, lesions or ulcers. HEENT: Atraumatic, normocephalic, no obvious bleeding Lungs: Clear to auscultation bilaterally CVS: Regular rate and rhythm, no murmur GI/Abd soft, abdominal distention no ascites less than yesterday.  Umbilical hernia noted CNS: Alert, awake, oriented x 3 Psychiatry: Mood appropriate Extremities: 1+ bilateral pedal edema  Data Review: I have personally reviewed the laboratory data and studies available.  F/u labs ordered Unresulted Labs (From admission, onward)     Start     Ordered   01/12/23 0500  CBC with Differential/Platelet  Daily,   R  01/11/23 1341   01/12/23 0500  Comprehensive metabolic panel  Daily,   R      01/11/23 1341   01/12/23 0500  Ammonia  Daily,   R      01/11/23 1341   01/11/23 1502  Total bilirubin, body fluid  RELEASE UPON ORDERING,   TIMED        01/11/23 1502   01/11/23 1501  Pathologist smear review  Once,   R        01/11/23 1501            Total time spent in review of labs and imaging, patient evaluation, formulation of plan, documentation and communication with family: 45 minutes  Signed, Lorin Glass, MD [facility] 01/12/2023

## 2023-01-13 ENCOUNTER — Inpatient Hospital Stay (HOSPITAL_COMMUNITY): Payer: 59

## 2023-01-13 DIAGNOSIS — K7290 Hepatic failure, unspecified without coma: Secondary | ICD-10-CM | POA: Diagnosis not present

## 2023-01-13 DIAGNOSIS — K7460 Unspecified cirrhosis of liver: Secondary | ICD-10-CM | POA: Diagnosis not present

## 2023-01-13 LAB — COMPREHENSIVE METABOLIC PANEL
ALT: 15 U/L (ref 0–44)
AST: 43 U/L — ABNORMAL HIGH (ref 15–41)
Albumin: 1.9 g/dL — ABNORMAL LOW (ref 3.5–5.0)
Alkaline Phosphatase: 103 U/L (ref 38–126)
Anion gap: 8 (ref 5–15)
BUN: 13 mg/dL (ref 8–23)
CO2: 24 mmol/L (ref 22–32)
Calcium: 7.7 mg/dL — ABNORMAL LOW (ref 8.9–10.3)
Chloride: 99 mmol/L (ref 98–111)
Creatinine, Ser: 0.65 mg/dL (ref 0.61–1.24)
GFR, Estimated: >60 mL/min (ref >60)
Glucose, Bld: 117 mg/dL — ABNORMAL HIGH (ref 70–99)
Potassium: 3.2 mmol/L — ABNORMAL LOW (ref 3.5–5.1)
Sodium: 131 mmol/L — ABNORMAL LOW (ref 135–145)
Total Bilirubin: 2.8 mg/dL — ABNORMAL HIGH (ref 0.3–1.2)
Total Protein: 6 g/dL — ABNORMAL LOW (ref 6.5–8.1)

## 2023-01-13 LAB — CBC WITH DIFFERENTIAL/PLATELET
Abs Immature Granulocytes: 0.01 10*3/uL (ref 0.00–0.07)
Basophils Absolute: 0 10*3/uL (ref 0.0–0.1)
Basophils Relative: 1 %
Eosinophils Absolute: 0.2 10*3/uL (ref 0.0–0.5)
Eosinophils Relative: 5 %
HCT: 30.4 % — ABNORMAL LOW (ref 39.0–52.0)
Hemoglobin: 10.5 g/dL — ABNORMAL LOW (ref 13.0–17.0)
Immature Granulocytes: 0 %
Lymphocytes Relative: 29 %
Lymphs Abs: 1.4 10*3/uL (ref 0.7–4.0)
MCH: 33.7 pg (ref 26.0–34.0)
MCHC: 34.5 g/dL (ref 30.0–36.0)
MCV: 97.4 fL (ref 80.0–100.0)
Monocytes Absolute: 1 10*3/uL (ref 0.1–1.0)
Monocytes Relative: 22 %
Neutro Abs: 2.1 10*3/uL (ref 1.7–7.7)
Neutrophils Relative %: 43 %
Platelets: 103 10*3/uL — ABNORMAL LOW (ref 150–400)
RBC: 3.12 MIL/uL — ABNORMAL LOW (ref 4.22–5.81)
RDW: 15.7 % — ABNORMAL HIGH (ref 11.5–15.5)
WBC: 4.8 10*3/uL (ref 4.0–10.5)
nRBC: 0 % (ref 0.0–0.2)

## 2023-01-13 LAB — GLUCOSE, CAPILLARY
Glucose-Capillary: 144 mg/dL — ABNORMAL HIGH (ref 70–99)
Glucose-Capillary: 124 mg/dL — ABNORMAL HIGH (ref 70–99)
Glucose-Capillary: 130 mg/dL — ABNORMAL HIGH (ref 70–99)

## 2023-01-13 LAB — AMMONIA: Ammonia: 58 umol/L — ABNORMAL HIGH (ref 9–35)

## 2023-01-13 MED ORDER — LACTULOSE 10 GM/15ML PO SOLN: 30.0000 g | Freq: Three times a day (TID) | ORAL | 0 refills | Status: AC

## 2023-01-13 MED ORDER — ALBUMIN HUMAN 25 % IV SOLN: 25.0000 g | Freq: Once | INTRAVENOUS | Status: DC | PRN

## 2023-01-13 MED ORDER — PROPRANOLOL HCL 10 MG PO TABS: 10.0000 mg | ORAL_TABLET | Freq: Two times a day (BID) | ORAL | 2 refills | Status: DC

## 2023-01-13 MED ORDER — FUROSEMIDE 40 MG PO TABS: 40.0000 mg | ORAL_TABLET | Freq: Two times a day (BID) | ORAL | Status: DC

## 2023-01-13 MED ORDER — FUROSEMIDE 40 MG PO TABS: 40.0000 mg | ORAL_TABLET | Freq: Two times a day (BID) | ORAL | 0 refills | Status: DC

## 2023-01-13 MED ORDER — AMOXICILLIN 500 MG PO CAPS: 500.0000 mg | ORAL_CAPSULE | Freq: Three times a day (TID) | ORAL | 0 refills | Status: AC

## 2023-01-13 MED ORDER — LIDOCAINE HCL 1 % IJ SOLN
INTRAMUSCULAR | Status: AC
  Filled 2023-01-13: qty 20

## 2023-01-13 MED ORDER — FUROSEMIDE 40 MG PO TABS
40.0000 mg | ORAL_TABLET | Freq: Two times a day (BID) | ORAL | Status: DC
  Administered 2023-01-13 – 2023-01-14 (×2): 40 mg via ORAL
  Filled 2023-01-13 (×2): qty 1

## 2023-01-13 MED ORDER — SPIRONOLACTONE 25 MG PO TABS: 25.0000 mg | ORAL_TABLET | Freq: Every day | ORAL | 0 refills | Status: DC

## 2023-01-13 MED ORDER — SPIRONOLACTONE 25 MG PO TABS
25.0000 mg | ORAL_TABLET | Freq: Every day | ORAL | Status: DC
  Administered 2023-01-14: 25 mg via ORAL
  Filled 2023-01-13: qty 1

## 2023-01-13 NOTE — Discharge Summary (Signed)
Physician Discharge Summary  Nathan Roberts ZOX:[id number] DOB: [birth year] DOA: 01/10/2023  PCP: Clayborne Dana, NP  Admit date: 01/10/2023 Discharge date: 01/14/2023  Admitted From: Home Discharge disposition: Home  Recommendations at discharge:  For liver cirrhosis, you have been started on Lasix, Aldactone, lactulose, propranolol, Protonix.  Ensure compliance with your medicines Outpatient referral to gastroenterology given You may need periodic paracentesis with IR. Outpatient referral to IR given.   Brief narrative: Nathan Roberts is a 62 y.o. male with PMH significant for DM2, HTN, HLD, chronic alcoholism, decompensated alcoholic cirrhosis complicated by esophageal varices, refractory ascites, portal hypertensive gastropathy, GI bleed. 6/12, patient presented to the ED with complaint of progressively worsening bilateral lower extremity edema, weight gain and worsening confusion Reports last alcohol drink was 3 months ago  In the ED, patient was afebrile and hemodynamically stable On exam he was noted to have asterixis and clonus Labs showed normal WBC count at 5.6, hemoglobin 12.9, sodium 129, creatinine normal, lipase normal, total bilirubin elevated to 5.1, INR 1.8 Urinalysis with amber-colored clear urine with positive nitrite, few bacteria Blood alcohol level not elevated CT did not show any acute abnormality, showed mild chronic ischemic changes CT abdomen pelvis showed significant findings related to liver cirrhosis and its sequelae as below Massive abdominopelvic ascites increased from 04/01/2022. Cirrhotic liver with portal hypertension and varices. 1.5 cm nonspecific hypodense lesion in segment 2 of the liver, unchanged since 04/01/2022 but may warrant PET-CT follow-up. Slight rim enhancement noted on MRI. Gastroenteritis versus changes due to portal hypertension, with mild dilatation of mid abdominal jejunal segments which could be due to ileus or low-grade partial  proximal small bowel obstruction. Thickened folds in the ascending colon which could be due to nondistention, colitis or portal colopathy. Diffuse body wall anasarca. Right-greater-than-left gynecomastia.  Admitted to [facility] See below for details  Subjective: Patient was seen and examined this morning.   Lying on bed.  Not in distress.  Feels better after repeat paracentesis yesterday.  Assessment and plan: Decompensated alcoholic cirrhosis, POA Large volume abdominopelvic ascites Ultrasound paracentesis done twice  6/13- 6 L removed. 6/15-8 L removed.  Albumin given after blood paracentesis to support blood pressure. Patient to follow-up with IR as an outpatient for regular paracentesis. Recent Labs  Lab 01/10/23 1935 01/11/23 0340 01/12/23 0327 01/13/23 0355 01/14/23 0338  AST 61* 54* 57* 43* 48*  ALT 23 20 17 15 15   ALKPHOS 118 93 97 103 108  BILITOT 5.1* 5.1* 2.8* 2.8* 2.9*  PROT 7.6 7.2 5.7* 6.0* 6.1*  ALBUMIN 2.5* 2.2* 1.9* 1.9* 1.9*    Diffuse anasarca Portal hypertension Essential hypertension PTA on propranolol twice daily as well as olmesartan.  I do not see Lasix or Aldactone on his home med list Aggressively diuresed with IV Lasix twice daily.  Subsequently switched to oral Lasix 40 mg twice daily and Aldactone 50 mg daily at discharge. Continue propranolol 10 mg twice daily Blood pressure soft with creatinine stable. Recent Labs    03/23/22 1157 12/22/22 1101 01/10/23 1935 01/11/23 0340 01/12/23 0327 01/13/23 0355 01/14/23 0338  BUN 4* 11 16 16 14 13 13   CREATININE 0.56 0.76 1.06 1.07 0.82 0.65 0.72   Hepatic encephalopathy Altered mental status due to elevated ammonia level Started on lactulose 30 mg daily.  Continue the same at discharge Recent Labs  Lab 01/10/23 2208 01/12/23 0327 01/13/23 0355 01/14/23 0338  AMMONIA 119* 74* 58* 40*   Hyponatremia Secondary to portal hypertension Continue to monitor  as patient Recent Labs  Lab  01/10/23 1935 01/11/23 0340 01/12/23 0327 01/13/23 0355 01/14/23 0338  NA 129* 130* 133* 131* 131*    Chronic macrocytic anemia History of esophageal varices, portal hypertensive gastropathy, portal colopathy Hemoglobin stable at this time.  Continue monitor as an outpatient Continue PPI Recent Labs    12/22/22 1101 01/10/23 1935 01/12/23 0612 01/13/23 0355 01/14/23 0338  HGB 12.3* 12.9* 10.0* 10.5* 11.0*  MCV 100.5* 97.9 96.1 97.4 97.2   Hypodense liver lesion No changes in last 10 months. Continue to follow-up as an outpatient  Type 2 diabetes mellitus A1c 4.8 on 12/22/2022 PTA on metformin 500 mg daily.  Continue same Recent Labs  Lab 01/12/23 2130 01/13/23 1211 01/13/23 1658 01/13/23 2049 01/14/23 0729  GLUCAP 133* 144* 124* 130* 111*   UTI Reports dysuria.  Positive urinalysis. Started on 5-day course of amoxicillin.  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-S1 spondylolisthesis Lumbar scoliosis and degenerative change Impaired mobility Able to ambulate with mobility tech   Goals of care   Code Status: Full Code   Wounds: Wound / Incision (Open or Dehisced) 11/27/17 Non-pressure wound Leg Right cellulitis, scabbed, red/pink, open area on RLE inside of leg (Active)  Date First Assessed: 11/27/17   Wound Type: Non-pressure wound  Location: Leg  Location Orientation: Right  Wound Description (Comments): cellulitis, scabbed, red/pink, open area on RLE inside of leg  Present on Admission: Yes    Assessments 11/30/2017 11:42 AM  Dressing Changed Changed  Dressing Status Clean;Dry;Intact  Dressing Change Frequency Daily  Site / Wound Assessment Red;Pink;Painful;Other (Comment)  Drainage Amount None  Drainage Description No odor  Treatment Cleansed;Ice applied;Other (Comment)     No associated orders.     Incision (Closed) 02/10/21 Hand Left (Active)  Date First Assessed/Time First Assessed: 02/10/21 1104   Location:  Hand  Location Orientation: Left    Assessments 02/10/2021 12:32 PM 02/10/2021  1:16 PM  Dressing Type Other (Comment) --  Dressing Clean;Dry;Intact --  Site / Wound Assessment Dressing in place / Unable to assess --  Drainage Amount None None     No associated orders.     Incision (Closed) 07/07/21 Hand Right (Active)  Date First Assessed/Time First Assessed: 07/07/21 0723   Location: Hand  Location Orientation: Right    Assessments 07/07/2021 11:00 AM 07/07/2021 11:54 AM  Dressing Type Gauze (Comment);Compression wrap Compression wrap  Dressing Clean;Dry;Intact Clean;Dry;Intact  Site / Wound Assessment Dressing in place / Unable to assess Dressing in place / Unable to assess  Drainage Amount None None     No associated orders.    Discharge Exam:   Vitals:   01/13/23 1304 01/13/23 1730 01/13/23 2044 01/14/23 0519  BP: 107/64 (!) 106/55 100/65 101/65  Pulse: 69 72 81 72  Resp: 16 14 17 17   Temp: 98.4 F (36.9 C) 97.8 F (36.6 C) 98.5 F (36.9 C) 98.4 F (36.9 C)  TempSrc: Oral Oral Oral Oral  SpO2: 100% 97% 96% 98%  Weight:      Height:        Body mass index is 32.69 kg/m.   General exam: Pleasant Skin: No rashes, lesions or ulcers. HEENT: Atraumatic, normocephalic, no obvious bleeding Lungs: Clear to auscultation bilaterally CVS: Regular rate and rhythm, no murmur GI/Abd soft, abdominal distention gradually improving.  Umbilical hernia present. CNS: Alert, awake, oriented x 3 Psychiatry: Mood appropriate Extremities: Improving bilateral pedal edema  Follow ups:  Follow-up Information     Clayborne Dana, NP Follow up.   Specialties: Family Medicine, Emergency Medicine Contact information: [address] [place] [address] [phone]         Hyman Hopes B, NP Follow up.   Specialties: Family Medicine, Emergency Medicine Contact information: [address] [place] [address] [phone]                  Discharge Instructions:   Discharge Instructions     Amb Referral to Hepatology   Complete by: As directed    Liver cirrhosis   Ambulatory referral to Interventional Radiology   Complete by: As directed    Regular paracentesis   Call MD for:  difficulty breathing, headache or visual disturbances   Complete by: As directed    Call MD for:  extreme fatigue   Complete by: As directed    Call MD for:  hives   Complete by: As directed    Call MD for:  persistant dizziness or light-headedness   Complete by: As directed    Call MD for:  persistant nausea and vomiting   Complete by: As directed    Call MD for:  severe uncontrolled pain   Complete by: As directed    Call MD for:  temperature >100.4   Complete by: As directed    Diet - low sodium heart healthy   Complete by: As directed    Discharge instructions   Complete by: As directed    Recommendations at discharge:   For liver cirrhosis, you have been started on Lasix, Aldactone, lactulose, propranolol, Protonix.  Ensure compliance with your medicines  Outpatient referral to gastroenterology given  You may need periodic paracentesis with IR. Outpatient referral to IR given.  General discharge instructions: Follow with Primary MD Clayborne Dana, NP in 7 days  Please request your PCP  to go over your hospital tests, procedures, radiology results at the follow up. Please get your medicines reviewed and adjusted.  Your PCP may decide to repeat certain labs or tests as needed. Do not drive, operate heavy machinery, perform activities at heights, swimming or participation in water activities or provide baby sitting services if your were admitted for syncope or siezures until you have seen by Primary MD or a Neurologist and advised to do so again. [place] Controlled Substance Reporting System database was reviewed. Do not drive, operate heavy machinery, perform activities at heights, swim, participate in water  activities or provide baby-sitting services while on medications for pain, sleep and mood until your outpatient physician has reevaluated you and advised to do so again.  You are strongly recommended to comply with the dose, frequency and duration of prescribed medications. Activity: As tolerated with Full fall precautions use walker/cane & assistance as needed Avoid using any recreational substances like cigarette, tobacco, alcohol, or non-prescribed drug. If you experience worsening of your admission symptoms, develop shortness of breath, life threatening emergency, suicidal or homicidal thoughts you must seek medical attention immediately by calling 911 or calling your MD immediately  if symptoms less severe. You must read complete instructions/literature along with all the possible adverse reactions/side effects for all the medicines you take and that have been prescribed to you. Take any new medicine only after you have completely understood and accepted all the possible adverse reactions/side effects.  Wear Seat belts while driving. You were cared for by a hospitalist during your hospital stay. If  you have any questions about your discharge medications or the care you received while you were in the hospital after you are discharged, you can call the unit and ask to speak with the hospitalist or the covering physician. Once you are discharged, your primary care physician will handle any further medical issues. Please note that NO REFILLS for any discharge medications will be authorized once you are discharged, as it is imperative that you return to your primary care physician (or establish a relationship with a primary care physician if you do not have one).   Increase activity slowly   Complete by: As directed        Discharge Medications:   Allergies as of 01/14/2023       Reactions   Cephalexin Rash   Doxycycline Rash   Latex Hives, Rash        Medication List     STOP taking these  medications    mirtazapine 15 MG tablet Commonly known as: REMERON   olmesartan 40 MG tablet Commonly known as: BENICAR   olmesartan-hydrochlorothiazide 40-12.5 MG tablet Commonly known as: BENICAR HCT       TAKE these medications    amoxicillin 500 MG capsule Commonly known as: AMOXIL Take 1 capsule (500 mg total) by mouth every 8 (eight) hours for 3 days.   furosemide 40 MG tablet Commonly known as: LASIX Take 1 tablet (40 mg total) by mouth 2 (two) times daily at 10 am and 4 pm.   lactulose 10 GM/15ML solution Commonly known as: CHRONULAC Take 45 mLs (30 g total) by mouth 3 (three) times daily.   metFORMIN 500 MG tablet Commonly known as: GLUCOPHAGE Take 500 mg by mouth daily.   multivitamin with minerals tablet Take 1 tablet by mouth daily.   pantoprazole 40 MG tablet Commonly known as: PROTONIX Take 1 tablet (40 mg total) by mouth 2 (two) times daily before a meal.   propranolol 10 MG tablet Commonly known as: INDERAL Take 1 tablet (10 mg total) by mouth 2 (two) times daily. What changed:  how much to take how to take this when to take this additional instructions   spironolactone 25 MG tablet Commonly known as: ALDACTONE Take 1 tablet (25 mg total) by mouth daily.         The results of significant diagnostics from this hospitalization (including imaging, microbiology, ancillary and laboratory) are listed below for reference.    Procedures and Diagnostic Studies:   US Paracentesis  Result Date: 01/11/2023 INDICATION: Patient with history of alcoholic cirrhosis, portal hypertension, anemia, esophageal varices, ascites. Request received for diagnostic and therapeutic paracentesis. EXAM: ULTRASOUND GUIDED DIAGNOSTIC AND THERAPEUTIC PARACENTESIS MEDICATIONS: 8 mL of 1% lidocaine COMPLICATIONS: None immediate. PROCEDURE: Informed written consent was obtained from the patient after a discussion of the risks, benefits and alternatives to treatment. A  timeout was performed prior to the initiation of the procedure. Initial ultrasound scanning demonstrates a large amount of ascites within the right lower abdominal quadrant. The right lower abdomen was prepped and draped in the usual sterile fashion. 1% lidocaine was used for local anesthesia. Following this, a 19 gauge, 10-cm, Yueh catheter was introduced. An ultrasound image was saved for documentation purposes. The paracentesis was performed. The catheter was removed and a dressing was applied. The patient tolerated the procedure well without immediate post procedural complication. Patient received post-procedure intravenous albumin; see nursing notes for details. FINDINGS: A total of approximately 6 liters of yellow fluid was removed. Samples were sent  to the laboratory as requested by the clinical team. IMPRESSION: Successful ultrasound-guided diagnostic and therapeutic paracentesis yielding 6 liters of peritoneal fluid. Due to this being patient's initial paracentesis only the above amount of fluid was removed today. PLAN: If the patient eventually requires >/=2 paracenteses in a 30 day period, candidacy for formal evaluation by the [facility] Interventional Radiology Portal Hypertension [facility] will be assessed. Performed by: Artemio Aly Electronically Signed   By: Irish Lack M.D.   On: 01/11/2023 15:11   CT ABDOMEN PELVIS W CONTRAST  Result Date: 01/10/2023 CLINICAL DATA:  Abdominal pain and distention. EXAM: CT ABDOMEN AND PELVIS WITH CONTRAST TECHNIQUE: Multidetector CT imaging of the abdomen and pelvis was performed using the standard protocol following bolus administration of intravenous contrast. RADIATION DOSE REDUCTION: This exam was performed according to the departmental dose-optimization program which includes automated exposure control, adjustment of the mA and/or kV according to patient size and/or use of iterative reconstruction technique. CONTRAST:  OMNIPAQUE IOHEXOL 300  MG/ML  SOLN COMPARISON:  Abdominal MRI dated 04/01/2022, without and with contrast. FINDINGS: Lower chest: Mild elevation right hemidiaphragm with overlying subsegmental atelectasis. Lung bases are otherwise clear. No pleural effusion. The cardiac size is normal. There is moderate patchy three-vessel coronary artery calcification. No pericardial effusion. Hepatobiliary: The liver is cirrhotic. The main portal vein is within normal caliber limits. Parenchymal heterogeneity is noted similar to the prior MRI. A nonspecific hypodense lesion in left hepatic segment 2 is again noted measuring 1.5 cm. This is unchanged since 04/01/2022 but may warrant PET-CT evaluation as the lesion was of low signal on both T1 and T2 imaging. There appeared to be faint rim enhancement on MRI but no visible internal enhancement. There was only faint restricted diffusion. Gallbladder and bile ducts are unremarkable. Pancreas: No focal abnormality. 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 Spleen: No mass. Slightly prominent spleen 13.8 cm coronal. Splenorenal varices. Adrenals/Urinary Tract: Adrenal glands are unremarkable. Kidneys are normal, without renal calculi, focal lesion, or hydronephrosis. Bladder is unremarkable for the degree of distention. Stomach/Bowel: Diffuse thickened folds in the stomach which could be gastritis or portal gastropathy. There is mild dilatation of mid abdominal jejunal segments up to 3.2 cm. No other bowel dilatation. There are thickened folds involving these segments, which could be due to enteritis, portal enteropathy, or reactive from the ascites. Rest of the small bowel is normal caliber. A transitional segment was not seen. This could be due to ileus or low-grade partial proximal small bowel obstruction. The large bowel is mostly contracted. There are thickened folds in the ascending colon which could be due to nondistention, colitis or portal colopathy. There is advanced left colonic diverticulosis without  inflammatory changes. The appendix is normal caliber. Vascular/Lymphatic: Aortic atherosclerosis. No enlarged abdominal or pelvic lymph nodes. In addition to the splenorenal shunting there are perigastric and mesenteric varices consistent with portal hypertension. Reproductive: Enlarged prostate, 4.9 cm transverse. Other: Massive abdominopelvic ascites increased from 04/01/2022, with inward bowel displacement. There is a moderate-sized umbilical hernia containing fluid. Small inguinal fat hernias. There is diffuse body wall anasarca. Right-greater-than-left gynecomastia. Musculoskeletal: Dextroscoliosis and degenerative change lumbar spine. There is a chronic left L5 pars defect with trace L5-S1 spondylolisthesis. Multilevel acquired lumbar foraminal stenosis. Multilevel bridging enthesopathy of the lower T-spine. There is ankylosis over the anterior right SI joint. There are no aggressive bone lesions or acute skeletal abnormality. IMPRESSION: 1. Massive abdominopelvic ascites increased from 04/01/2022. 2. Cirrhotic liver with portal hypertension and varices. 3. 1.5  cm nonspecific hypodense lesion in segment 2 of the liver, unchanged since 04/01/2022 but may warrant PET-CT follow-up. Slight rim enhancement noted on MRI. 4. Gastroenteritis versus changes due to portal hypertension, with mild dilatation of mid abdominal jejunal segments which could be due to ileus or low-grade partial proximal small bowel obstruction. 5. Thickened folds in the ascending colon which could be due to nondistention, colitis or portal colopathy. 6. Diffuse body wall anasarca. 7. Moderate-sized umbilical hernia containing fluid. 8. Aortic and coronary artery atherosclerosis. 9. Prostatomegaly. 10. 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 Lumbar scoliosis and degenerative change. 11. Right-greater-than-left gynecomastia. Aortic Atherosclerosis (ICD10-I70.0). Electronically Signed   By: Almira Bar M.D.   On:  01/10/2023 22:21   DG Chest Port 1 View  Result Date: 01/10/2023 CLINICAL DATA:  Shortness of breath. EXAM: PORTABLE CHEST 1 VIEW COMPARISON:  Lung bases from abdominal CT earlier today. FINDINGS: Low lung volumes. Normal heart size for technique. No focal airspace disease, pulmonary edema, pleural effusion or pneumothorax. No acute osseous findings. IMPRESSION: Low lung volumes without acute process. Electronically Signed   By: Narda Rutherford M.D.   On: 01/10/2023 22:00   CT Head Wo Contrast  Result Date: 01/10/2023 CLINICAL DATA:  Mental status change, unknown cause EXAM: CT HEAD WITHOUT CONTRAST TECHNIQUE: Contiguous axial images were obtained from the base of the skull through the vertex without intravenous contrast. RADIATION DOSE REDUCTION: This exam was performed according to the departmental dose-optimization program which includes automated exposure control, adjustment of the mA and/or kV according to patient size and/or use of iterative reconstruction technique. COMPARISON:  None Available. FINDINGS: Brain: Brain volume is normal for age. No intracranial hemorrhage, mass effect, or midline shift. No hydrocephalus. The basilar cisterns are patent. Mild periventricular chronic small vessel ischemia. No evidence of territorial infarct or acute ischemia. No extra-axial or intracranial fluid collection. Vascular: No hyperdense vessel or unexpected calcification. Skull: No fracture or focal lesion. Sinuses/Orbits: Partial opacification of lower mastoid air cells. Mucous retention cyst in the left maxillary sinus. Other: None. IMPRESSION: 1. No acute intracranial abnormality. 2. Mild chronic small vessel ischemia. Electronically Signed   By: Narda Rutherford M.D.   On: 01/10/2023 21:56     Labs:   Basic Metabolic Panel: Recent Labs  Lab 01/10/23 1935 01/11/23 0340 01/12/23 0327 01/13/23 0355 01/14/23 0338  NA 129* 130* 133* 131* 131*  K 3.8 3.7 3.6 3.2* 3.5  CL 97* 97* 101 99 99  CO2 23  25 25 24 26   GLUCOSE 108* 116* 119* 117* 119*  BUN 16 16 14 13 13   CREATININE 1.06 1.07 0.82 0.65 0.72  CALCIUM 8.3* 8.3* 7.7* 7.7* 7.7*   GFR Estimated Creatinine Clearance: 105.6 mL/min (by C-G formula based on SCr of 0.72 mg/dL). Liver Function Tests: Recent Labs  Lab 01/10/23 1935 01/11/23 0340 01/12/23 0327 01/13/23 0355 01/14/23 0338  AST 61* 54* 57* 43* 48*  ALT 23 20 17 15 15   ALKPHOS 118 93 97 103 108  BILITOT 5.1* 5.1* 2.8* 2.8* 2.9*  PROT 7.6 7.2 5.7* 6.0* 6.1*  ALBUMIN 2.5* 2.2* 1.9* 1.9* 1.9*   Recent Labs  Lab 01/10/23 1935  LIPASE 45   Recent Labs  Lab 01/10/23 2208 01/12/23 0327 01/13/23 0355 01/14/23 0338  AMMONIA 119* 74* 58* 40*   Coagulation profile Recent Labs  Lab 01/11/23 0340  INR 1.8*    CBC: Recent Labs  Lab 01/10/23 1935 01/12/23 0612 01/13/23 0355 01/14/23 0338  WBC 5.6 4.6  4.8 5.6  NEUTROABS  --  2.0 2.1 2.5  HGB 12.9* 10.0* 10.5* 11.0*  HCT 37.9* 29.3* 30.4* 31.8*  MCV 97.9 96.1 97.4 97.2  PLT 146* 102* 103* 106*   Cardiac Enzymes: No results for input(s): "CKTOTAL", "CKMB", "CKMBINDEX", "TROPONINI" in the last 168 hours. BNP: Invalid input(s): "POCBNP" CBG: Recent Labs  Lab 01/12/23 2130 01/13/23 1211 01/13/23 1658 01/13/23 2049 01/14/23 0729  GLUCAP 133* 144* 124* 130* 111*   D-Dimer No results for input(s): "DDIMER" in the last 72 hours. Hgb A1c No results for input(s): "HGBA1C" in the last 72 hours. Lipid Profile No results for input(s): "CHOL", "HDL", "LDLCALC", "TRIG", "CHOLHDL", "LDLDIRECT" in the last 72 hours. Thyroid function studies No results for input(s): "TSH", "T4TOTAL", "T3FREE", "THYROIDAB" in the last 72 hours.  Invalid input(s): "FREET3" Anemia work up No results for input(s): "VITAMINB12", "FOLATE", "FERRITIN", "TIBC", "IRON", "RETICCTPCT" in the last 72 hours. Microbiology No results found for this or any previous visit (from the past 240 hour(s)).  Time coordinating discharge: 45  minutes  Signed: Inocente Krach  [facility] 01/14/2023, 11:31 AM

## 2023-01-13 NOTE — Procedures (Signed)
PROCEDURE SUMMARY:  Successful US guided paracentesis from right lateral abdomen.  Yielded 8.0 liters of yellow, clear fluid.  No immediate complications.  Pt tolerated well.   Specimen was not sent for labs.  EBL < 5mL  Hoyt Koch PA-C 01/13/2023 12:00 PM

## 2023-01-13 NOTE — Progress Notes (Signed)
PROGRESS NOTE  Nathan Roberts  DOB: 1961-06-25  PCP: Clayborne Dana, NP ZOX:[id number]  DOA: 01/10/2023  LOS: 2 days  Hospital Day: 4  Brief narrative: Nathan Roberts is a 62 y.o. male with PMH significant for DM2, HTN, HLD, chronic alcoholism, decompensated alcoholic cirrhosis complicated by esophageal varices, refractory ascites, portal hypertensive gastropathy, GI bleed. 6/12, patient presented to the ED with complaint of progressively worsening bilateral lower extremity edema, weight gain and worsening confusion Reports last alcohol drink was 3 months ago  In the ED, patient was afebrile and hemodynamically stable On exam he was noted to have asterixis and clonus Labs showed normal WBC count at 5.6, hemoglobin 12.9, sodium 129, creatinine normal, lipase normal, total bilirubin elevated to 5.1, INR 1.8 Urinalysis with amber-colored clear urine with positive nitrite, few bacteria Blood alcohol level not elevated CT did not show any acute abnormality, showed mild chronic ischemic changes CT abdomen pelvis showed significant findings related to liver cirrhosis and its sequelae as below Massive abdominopelvic ascites increased from 04/01/2022. Cirrhotic liver with portal hypertension and varices. 1.5 cm nonspecific hypodense lesion in segment 2 of the liver, unchanged since 04/01/2022 but may warrant PET-CT follow-up. Slight rim enhancement noted on MRI. Gastroenteritis versus changes due to portal hypertension, with mild dilatation of mid abdominal jejunal segments which could be due to ileus or low-grade partial proximal small bowel obstruction. Thickened folds in the ascending colon which could be due to nondistention, colitis or portal colopathy. Diffuse body wall anasarca. Right-greater-than-left gynecomastia.  Admitted to [facility]  Subjective: Patient was seen and examined this morning.   Lying on bed.  Not in distress.  Pending repeat paracentesis today.  Assessment and  plan: Decompensated alcoholic cirrhosis, POA Large volume abdominopelvic ascites Ultrasound paracentesis done yesterday, 6 L removed.  Still has significant retention. I ordered for repeat paracentesis repeat today. Reports patient never had paracentesis in the past.  May need periodic paracentesis going forward. Recent Labs  Lab 01/10/23 1935 01/11/23 0340 01/12/23 0327 01/13/23 0355  AST 61* 54* 57* 43*  ALT 23 20 17 15   ALKPHOS 118 93 97 103  BILITOT 5.1* 5.1* 2.8* 2.8*  PROT 7.6 7.2 5.7* 6.0*  ALBUMIN 2.5* 2.2* 1.9* 1.9*    Diffuse anasarca Portal hypertension Essential hypertension PTA on propranolol twice daily as well as olmesartan.  I do not see Lasix or Aldactone on his home med list Currently on IV Lasix 40 mg twice daily.  Switch to oral Lasix 40 mg twice daily and Aldactone 50 mg daily at discharge. Continue propranolol 10 mg twice daily Blood pressure soft with creatinine stable. Recent Labs    03/23/22 1157 12/22/22 1101 01/10/23 1935 01/11/23 0340 01/12/23 0327 01/13/23 0355  BUN 4* 11 16 16 14 13   CREATININE 0.56 0.76 1.06 1.07 0.82 0.65   Hepatic encephalopathy Altered mental status due to elevated ammonia level Started on lactulose 30 mg daily.  Continue the same at discharge Recent Labs  Lab 01/10/23 2208 01/12/23 0327 01/13/23 0355  AMMONIA 119* 74* 58*   Hyponatremia Secondary to portal hypertension Continue to monitor as patient Recent Labs  Lab 01/10/23 1935 01/11/23 0340 01/12/23 0327 01/13/23 0355  NA 129* 130* 133* 131*    Chronic macrocytic anemia History of esophageal varices, portal hypertensive gastropathy, portal colopathy Hemoglobin stable at this time.  Continue monitor as an outpatient Continue PPI Recent Labs    03/23/22 1157 12/22/22 1101 01/10/23 1935 01/12/23 0612 01/13/23 0355  HGB 12.0* 12.3* 12.9*  10.0* 10.5*  MCV 91.5 100.5* 97.9 96.1 97.4   Hypodense liver lesion No changes in last 10 months. Continue  to follow-up as an outpatient  Type 2 diabetes mellitus A1c 4.8 on 12/22/2022 PTA on metformin 500 mg daily.  Currently not on it. 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 Yes; Fluid consistency: Thin  Diet effective now                   Scheduled Meds:  amoxicillin  500 mg Oral Q8H   furosemide  40 mg Oral BID   insulin aspart  0-5 Units Subcutaneous QHS   insulin aspart  0-9 Units Subcutaneous TID WC   lactulose  30 g Oral TID   pantoprazole  40 mg Oral BID AC   propranolol  10 mg Oral BID   rifaximin  550 mg Oral BID   [START ON 01/14/2023] spironolactone  25 mg Oral Daily    PRN meds: acetaminophen **OR** acetaminophen, albumin human   Infusions:   albumin human      Antimicrobials: Anti-infectives (From admission, onward)    Start     Dose/Rate Route Frequency Ordered Stop   01/12/23 0830  amoxicillin (AMOXIL) capsule 500 mg        500 mg Oral Every 8 hours 01/12/23 0739 01/17/23 0559   01/11/23 0015  rifaximin (XIFAXAN) tablet 550 mg        550 mg Oral 2 times daily 01/11/23 0011     01/11/23 0015  sulfamethoxazole-trimethoprim (BACTRIM DS) 800-160 MG per tablet 1 tablet  Status:  Discontinued        1 tablet Oral Every 12 hours 01/11/23 0013  01/11/23 0844       Nutritional status:  Body mass index is 32.69 kg/m.          Objective: Vitals:   01/13/23 0500 01/13/23 0954  BP: 106/74 (!) 94/52  Pulse: 72 75  Resp: 17 14  Temp: 98.7 F (37.1 C) 98.2 F (36.8 C)  SpO2: 94% 96%    Intake/Output Summary (Last 24 hours) at 01/13/2023 1111 Last data filed at 01/13/2023 0953 Gross per 24 hour  Intake 540 ml  Output --  Net 540 ml   Filed Weights   01/10/23 1924  Weight: 97.5 kg   Weight change:  Body mass index is 32.69 kg/m.   Physical Exam: General exam: Pleasant Skin: No rashes, lesions or ulcers. HEENT: Atraumatic, normocephalic, no obvious bleeding Lungs: Clear to auscultation bilaterally CVS: Regular rate and rhythm, no murmur GI/Abd soft, abdominal distention no ascites less than yesterday.  Umbilical hernia noted CNS: Alert, awake, oriented x 3 Psychiatry: Mood appropriate Extremities: 1+ bilateral pedal edema  Data Review: I have personally reviewed  the laboratory data and studies available.  F/u labs ordered Unresulted Labs (From admission, onward)     Start     Ordered   01/12/23 0500  CBC with Differential/Platelet  Daily,   R      01/11/23 1341   01/12/23 0500  Comprehensive metabolic panel  Daily,   R      01/11/23 1341   01/12/23 0500  Ammonia  Daily,   R      01/11/23 1341   01/11/23 1502  Total bilirubin, body fluid  RELEASE UPON ORDERING,   TIMED        01/11/23 1502   01/11/23 1501  Pathologist smear review  Once,   R        01/11/23 1501            Total time spent in review of labs and imaging, patient evaluation, formulation of plan, documentation and communication with family: 45 minutes  Signed, Lorin Glass, MD [facility] 01/13/2023

## 2023-01-14 DIAGNOSIS — K7290 Hepatic failure, unspecified without coma: Secondary | ICD-10-CM | POA: Diagnosis not present

## 2023-01-14 DIAGNOSIS — K7460 Unspecified cirrhosis of liver: Secondary | ICD-10-CM | POA: Diagnosis not present

## 2023-01-14 LAB — CBC WITH DIFFERENTIAL/PLATELET
Abs Immature Granulocytes: 0.02 10*3/uL (ref 0.00–0.07)
Basophils Absolute: 0 10*3/uL (ref 0.0–0.1)
Basophils Relative: 1 %
Eosinophils Absolute: 0.3 10*3/uL (ref 0.0–0.5)
Eosinophils Relative: 6 %
HCT: 31.8 % — ABNORMAL LOW (ref 39.0–52.0)
Hemoglobin: 11 g/dL — ABNORMAL LOW (ref 13.0–17.0)
Immature Granulocytes: 0 %
Lymphocytes Relative: 29 %
Lymphs Abs: 1.6 10*3/uL (ref 0.7–4.0)
MCH: 33.6 pg (ref 26.0–34.0)
MCHC: 34.6 g/dL (ref 30.0–36.0)
MCV: 97.2 fL (ref 80.0–100.0)
Monocytes Absolute: 1 10*3/uL (ref 0.1–1.0)
Monocytes Relative: 19 %
Neutro Abs: 2.5 10*3/uL (ref 1.7–7.7)
Neutrophils Relative %: 45 %
Platelets: 106 10*3/uL — ABNORMAL LOW (ref 150–400)
RBC: 3.27 MIL/uL — ABNORMAL LOW (ref 4.22–5.81)
RDW: 15.3 % (ref 11.5–15.5)
WBC: 5.6 10*3/uL (ref 4.0–10.5)
nRBC: 0 % (ref 0.0–0.2)

## 2023-01-14 LAB — COMPREHENSIVE METABOLIC PANEL
ALT: 15 U/L (ref 0–44)
AST: 48 U/L — ABNORMAL HIGH (ref 15–41)
Albumin: 1.9 g/dL — ABNORMAL LOW (ref 3.5–5.0)
Alkaline Phosphatase: 108 U/L (ref 38–126)
Anion gap: 6 (ref 5–15)
BUN: 13 mg/dL (ref 8–23)
CO2: 26 mmol/L (ref 22–32)
Calcium: 7.7 mg/dL — ABNORMAL LOW (ref 8.9–10.3)
Chloride: 99 mmol/L (ref 98–111)
Creatinine, Ser: 0.72 mg/dL (ref 0.61–1.24)
GFR, Estimated: >60 mL/min (ref >60)
Glucose, Bld: 119 mg/dL — ABNORMAL HIGH (ref 70–99)
Potassium: 3.5 mmol/L (ref 3.5–5.1)
Sodium: 131 mmol/L — ABNORMAL LOW (ref 135–145)
Total Bilirubin: 2.9 mg/dL — ABNORMAL HIGH (ref 0.3–1.2)
Total Protein: 6.1 g/dL — ABNORMAL LOW (ref 6.5–8.1)

## 2023-01-14 LAB — GLUCOSE, CAPILLARY
Glucose-Capillary: 111 mg/dL — ABNORMAL HIGH (ref 70–99)
Glucose-Capillary: 138 mg/dL — ABNORMAL HIGH (ref 70–99)

## 2023-01-14 LAB — AMMONIA: Ammonia: 40 umol/L — ABNORMAL HIGH (ref 9–35)

## 2023-01-14 NOTE — Plan of Care (Signed)
  Problem: Coping: Goal: Level of anxiety will decrease Outcome: Progressing   Problem: Pain Managment: Goal: General experience of comfort will improve Outcome: Progressing   

## 2023-01-15 LAB — GLUCOSE, CAPILLARY: Glucose-Capillary: 121 mg/dL — ABNORMAL HIGH (ref 70–99)

## 2023-01-17 LAB — TOTAL BILIRUBIN, BODY FLUID: Total bilirubin, fluid: 1.3 mg/dL

## 2023-01-19 DIAGNOSIS — K429 Umbilical hernia without obstruction or gangrene: Secondary | ICD-10-CM | POA: Diagnosis not present

## 2023-01-19 DIAGNOSIS — R188 Other ascites: Secondary | ICD-10-CM | POA: Diagnosis not present

## 2023-01-19 DIAGNOSIS — K7460 Unspecified cirrhosis of liver: Secondary | ICD-10-CM | POA: Diagnosis not present

## 2023-02-16 ENCOUNTER — Emergency Department (HOSPITAL_COMMUNITY)
Admission: EM | Admit: 2023-02-16 | Discharge: 2023-02-16 | Disposition: A | Discharge status: Home / Self Care | Payer: 59 | Attending: Emergency Medicine | Admitting: Emergency Medicine

## 2023-02-16 ENCOUNTER — Emergency Department (HOSPITAL_COMMUNITY): Payer: 59

## 2023-02-16 DIAGNOSIS — K7031 Alcoholic cirrhosis of liver with ascites: Secondary | ICD-10-CM | POA: Diagnosis not present

## 2023-02-16 DIAGNOSIS — Z9889 Other specified postprocedural states: Secondary | ICD-10-CM | POA: Diagnosis not present

## 2023-02-16 DIAGNOSIS — R188 Other ascites: Secondary | ICD-10-CM | POA: Diagnosis not present

## 2023-02-16 DIAGNOSIS — Z91040 Latex allergy status: Secondary | ICD-10-CM | POA: Diagnosis not present

## 2023-02-16 DIAGNOSIS — K429 Umbilical hernia without obstruction or gangrene: Secondary | ICD-10-CM | POA: Diagnosis not present

## 2023-02-16 NOTE — ED Triage Notes (Signed)
Pt arrived via POV, c/o umbilical hernia that is leaking. States it has been x3 weeks

## 2023-02-16 NOTE — ED Notes (Signed)
Patient provided blanket.

## 2023-02-16 NOTE — Discharge Instructions (Signed)
It was our pleasure to provide your ER care today - we hope that you feel better.  Follow up with your primary care doctor in the next 1-2 weeks - at visit, discuss possibility of them coordinating future outpatient paracentesis, along with your follow up with hepatology/liver specialists at [facility].   Return to ER if worse, new symptoms, fevers, new or worsening or severe abdominal pain, persistent vomiting, trouble breathing, fainting, or other concern.

## 2023-02-16 NOTE — Procedures (Addendum)
PROCEDURE SUMMARY:  Successful ultrasound guided paracentesis from the right lower  quadrant.  Yielded 15.3 liters of hazy, yellow fluid.  No immediate complications.  The patient tolerated the procedure well.   Specimen was not sent for labs. Pt's nurse notified of above.   EBL none  The patient has required >/=2 paracenteses in a 30 day period and a screening evaluation by the [facility] Interventional Radiology Portal Hypertension Clinic has been arranged.   Nathan Roberts

## 2023-02-16 NOTE — ED Provider Notes (Addendum)
[place] EMERGENCY DEPARTMENT AT [facility] Provider Note   CSN: [id number] Arrival date & time: 02/16/23  0840     History  Chief Complaint  Patient presents with   Hernia    DELONTE MUSICH is a 62 y.o. male.  Pt with hx etoh cirrhosis, c/o swelling/ascites to abdomen, chronic umbilical hernia, and indicates in past three weeks slow leak of ascites from umbilical hernia. Pt in process of hepatology eval at [facility], and consideration of possible umbilical hernia repair there. Indicates has stopped drinking etoh previously, no recent use. No vomiting. Having bms. No fevers/chills/sweats. No new or worsening abd pain.   The history is provided by the patient and medical records.       Home Medications Prior to Admission medications   Not on File      Allergies    Cephalexin, Doxycycline, and Latex    Review of Systems   Review of Systems  Constitutional:  Negative for chills, diaphoresis and fever.  HENT:  Negative for sore throat.   Respiratory:  Negative for cough and shortness of breath.   Cardiovascular:  Negative for chest pain.  Gastrointestinal:  Negative for abdominal pain and vomiting.  Genitourinary:  Negative for decreased urine volume and dysuria.  Musculoskeletal:  Negative for back pain.  Neurological:  Negative for headaches.    Physical Exam Updated Vital Signs BP 117/74 (BP Location: Left Arm)   Pulse 68   Temp (!) 97.5 F (36.4 C)   Resp 16   SpO2 97%  Physical Exam Vitals and nursing note reviewed.  Constitutional:      Appearance: Normal appearance. He is well-developed.  HENT:     Head: Atraumatic.     Nose: Nose normal.     Mouth/Throat:     Mouth: Mucous membranes are moist.     Pharynx: Oropharynx is clear.  Eyes:     General: No scleral icterus.    Conjunctiva/sclera: Conjunctivae normal.  Neck:     Trachea: No tracheal deviation.  Cardiovascular:     Rate and Rhythm: Normal rate and regular rhythm.      Pulses: Normal pulses.  Pulmonary:     Effort: Pulmonary effort is normal. No accessory muscle usage or respiratory distress.  Abdominal:     General: Bowel sounds are normal. There is distension.     Palpations: Abdomen is soft.     Tenderness: There is no abdominal tenderness. There is no guarding.     Comments: +ascites. Soft, non tender, umbilical hernia.  V slow drip of clear ascites fluid from ~ 6 o-clock position of umbilical hernia. No sign of infection to area.   Genitourinary:    Comments: No cva tenderness. Musculoskeletal:     Cervical back: Normal range of motion and neck supple. No rigidity.     Right lower leg: No edema.     Left lower leg: No edema.  Skin:    General: Skin is warm and dry.     Findings: No rash.  Neurological:     Mental Status: He is alert.     Comments: Alert, speech clear. Steady gait.   Psychiatric:        Mood and Affect: Mood normal.     ED Results / Procedures / Treatments   Labs (all labs ordered are listed, but only abnormal results are displayed) Labs Reviewed - No data to display  EKG None  Radiology US Paracentesis  Result Date: 02/16/2023 INDICATION: Patient with  history of alcoholic cirrhosis, portal hypertension, refractory ascites; request received for therapeutic paracentesis. EXAM: ULTRASOUND GUIDED THERAPEUTIC PARACENTESIS MEDICATIONS: 8 mL 1% lidocaine COMPLICATIONS: None immediate. PROCEDURE: Informed written consent was obtained from the patient after a discussion of the risks, benefits and alternatives to treatment. A timeout was performed prior to the initiation of the procedure. Initial ultrasound scanning demonstrates a large amount of ascites within the right lower abdominal quadrant. The right lower abdomen was prepped and draped in the usual sterile fashion. 1% lidocaine was used for local anesthesia. Following this, a 19 gauge, 10-cm, Yueh catheter was introduced. An ultrasound image was saved for documentation  purposes. The paracentesis was performed. The catheter was removed and a dressing was applied. The patient tolerated the procedure well without immediate post procedural complication. Patient received post-procedure intravenous albumin; see nursing notes for details. FINDINGS: A total of approximately of 15.3 liters of hazy, yellow fluid was removed. Samples were sent to the laboratory as requested by the clinical team. IMPRESSION: Successful ultrasound-guided therapeutic paracentesis yielding 15.3 liters of peritoneal fluid. PLAN: The patient has required >/=2 paracenteses in a 30 day period and a formal evaluation by the [facility] Interventional Radiology Portal Hypertension Clinic has been arranged. Performed by: Artemio Aly Electronically Signed   By: Marliss Coots M.D.   On: 02/16/2023 16:08    Procedures Procedures    Medications Ordered in ED Medications - No data to display  ED Course/ Medical Decision Making/ A&P                             Medical Decision Making Problems Addressed: Alcoholic cirrhosis of liver with ascites (HCC): chronic illness or injury with exacerbation, progression, or side effects of treatment that poses a threat to life or bodily functions S/P abdominal paracentesis: acute illness or injury Umbilical hernia without obstruction and without gangrene: chronic illness or injury with exacerbation, progression, or side effects of treatment that poses a threat to life or bodily functions  Amount and/or Complexity of Data Reviewed External Data Reviewed: notes. Radiology: ordered and independent interpretation performed. Decision-making details documented in ED Course.   Will get u/s paracentesis (hopefully with improve patients symptoms/comfort, lower pressure and allow slow leak of ascites fluid to improve/resolve).  No abd tenderness. No fevers.   Reviewed nursing notes and prior charts for additional history.   Pt went to radiology for paracentesis,  reports 15 l fluid off. Feels improved. No abd pain. Abd soft non tender. Umbilical hernia soft, non tender, reduced. No fluid leak. No faintness or dizziness.   IR did arrange for f/u in portal htn/ascites clinic.   Po fluids/food.  Pt currently appear stable for d/c.   Pt has hepatology f/u already arranged.   Also rec pcp f/u to coordinate further f/u, possible future outpatient paracentesis, etc.  Return precautions provided.            Final Clinical Impression(s) / ED Diagnoses Final diagnoses:  Alcoholic cirrhosis of liver with ascites (HCC)  S/P abdominal paracentesis  Umbilical hernia without obstruction and without gangrene    Rx / DC Orders ED Discharge Orders     None            Cathren Laine, MD 02/16/23 1627

## 2023-02-20 ENCOUNTER — Ambulatory Visit (INDEPENDENT_AMBULATORY_CARE_PROVIDER_SITE_OTHER): Payer: 59 | Admitting: Family Medicine

## 2023-02-20 ENCOUNTER — Encounter: Payer: Self-pay | Admitting: Family Medicine

## 2023-02-20 ENCOUNTER — Encounter: Payer: Self-pay | Admitting: *Deleted

## 2023-02-20 ENCOUNTER — Telehealth: Payer: Self-pay | Admitting: *Deleted

## 2023-02-20 VITALS — BP 126/78 | HR 104 | Ht 68.0 in | Wt 203.0 lb

## 2023-02-20 DIAGNOSIS — K2100 Gastro-esophageal reflux disease with esophagitis, without bleeding: Secondary | ICD-10-CM | POA: Diagnosis not present

## 2023-02-20 DIAGNOSIS — K429 Umbilical hernia without obstruction or gangrene: Secondary | ICD-10-CM

## 2023-02-20 DIAGNOSIS — R0781 Pleurodynia: Secondary | ICD-10-CM | POA: Diagnosis not present

## 2023-02-20 DIAGNOSIS — K7031 Alcoholic cirrhosis of liver with ascites: Secondary | ICD-10-CM

## 2023-02-20 MED ORDER — FUROSEMIDE 40 MG PO TABS
40.0000 mg | ORAL_TABLET | Freq: Two times a day (BID) | ORAL | 0 refills | Status: AC
Start: 2023-02-20 — End: 2023-04-25

## 2023-02-20 MED ORDER — SPIRONOLACTONE 25 MG PO TABS
25.0000 mg | ORAL_TABLET | Freq: Every day | ORAL | 0 refills | Status: AC
Start: 2023-02-20 — End: 2023-04-25

## 2023-02-20 MED ORDER — PANTOPRAZOLE SODIUM 40 MG PO TBEC: 40.0000 mg | DELAYED_RELEASE_TABLET | Freq: Two times a day (BID) | ORAL | 0 refills | Status: DC

## 2023-02-20 NOTE — Assessment & Plan Note (Signed)
Ascites: Recent large volume paracentesis (15 liters) with subsequent post-procedure dizziness and fatigue. Currently feeling better and ambulating. Referral to interventional radiology for future paracentesis has already been placed - he is waiting for a call from them. -Continue current management and follow up with GI specialist on August 2nd. -We need to repeat CMP, but he prefers to wait until GI appointment. Advised him to please let us know if this is not done so we can update them here.

## 2023-02-20 NOTE — Patient Instructions (Signed)
Meds refilled today Keep your coming GI appointment with the liver doctor. If they don't do labs, I will need to do some, so please let me know.

## 2023-02-20 NOTE — Transitions of Care (Post Inpatient/ED Visit) (Signed)
02/20/2023  Name: Nathan Roberts MRN: [id number] DOB: [birth year]  Today's TOC FU Call Status: Today's TOC FU Call Status:: Successful TOC FU Call Competed TOC FU Call Complete Date: 02/20/23  ED EMMI Red Alert notification on 02/19/23 from ED visit 02/16/23- automated EMMI call placed 02/18/23: "No scheduled follow up"  Transition Care Management Follow-up Telephone Call Date of Discharge: 02/16/23 Discharge Facility: [facility]) Type of Discharge: Emergency Department Reason for ED Visit: Other: (ascites; paracentesis) How have you been since you were released from the hospital?: Better ("I am doing okay.  I go to my liver doctor on 03/02/23; I reviewed my medicine with Ladona Ridgel today during my office visit") Any questions or concerns?: No  Items Reviewed: Did you receive and understand the discharge instructions provided?: Yes Medications obtained,verified, and reconciled?: No Medications Not Reviewed Reasons:: Other: (patient declined) Any new allergies since your discharge?: No Dietary orders reviewed?: No (patient declined full TOC call) Do you have support at home?:  (unable to assess- patient declined full TOC call)  Medications Reviewed Today: Medications Reviewed Today     Reviewed by Michaela Corner, RN (Registered Nurse) on 02/20/23 at 1609  Med List Status: <None>   Medication Order Taking? Sig Documenting Provider Last Dose Status Informant  furosemide (LASIX) 40 MG tablet [id number]  Take 1 tablet (40 mg total) by mouth 2 (two) times daily at 10 am and 4 pm. Clayborne Dana, NP  Active   metFORMIN (GLUCOPHAGE) 500 MG tablet [id number] No Take 500 mg by mouth daily. [provider] Taking Active Self, Pharmacy Records  Multiple Vitamins-Minerals (MULTIVITAMIN WITH MINERALS) tablet [id number] No Take 1 tablet by mouth daily. [provider] Taking Active Self, Pharmacy Records  pantoprazole (PROTONIX) 40 MG tablet [id number]  Take 1 tablet (40 mg total) by  mouth 2 (two) times daily before a meal. Clayborne Dana, NP  Active   propranolol (INDERAL) 10 MG tablet [id number] No Take 1 tablet (10 mg total) by mouth 2 (two) times daily. Lorin Glass, MD Taking Active   spironolactone (ALDACTONE) 25 MG tablet [id number]  Take 1 tablet (25 mg total) by mouth daily. Clayborne Dana, NP  Active            Home Care and Equipment/Supplies: Were Home Health Services Ordered?: NA Any new equipment or medical supplies ordered?: NA  Functional Questionnaire: Do you need assistance with bathing/showering or dressing?: No Do you need assistance with meal preparation?: No Do you need assistance with eating?: No Do you have difficulty maintaining continence: No Do you need assistance with getting out of bed/getting out of a chair/moving?: No Do you have difficulty managing or taking your medications?: No  Follow up appointments reviewed: PCP Follow-up appointment confirmed?: Yes Date of PCP follow-up appointment?: 02/20/23 Follow-up Provider: Hyman Hopes NP Specialist Hospital Follow-up appointment confirmed?: Yes Date of Specialist follow-up appointment?: 03/02/23 Follow-Up Specialty Provider:: GI provider Do you need transportation to your follow-up appointment?: No Do you understand care options if your condition(s) worsen?: Yes-patient verbalized understanding  SDOH Interventions Today    Flowsheet Row Most Recent Value  SDOH Interventions   Transportation Interventions Intervention Not Indicated  [drives self]      TOC Interventions Today    Flowsheet Row Most Recent Value  TOC Interventions   TOC Interventions Discussed/Reviewed TOC Interventions Discussed      Interventions Today    Flowsheet Row Most Recent Value  Chronic Disease   Chronic disease during today's visit  Other  [cirrhosis with paracentesis]  General Interventions   General Interventions Discussed/Reviewed General Interventions Discussed, Doctor Visits  Doctor  Visits Discussed/Reviewed Doctor Visits Reviewed, Doctor Visits Discussed, PCP, Specialist  PCP/Specialist Visits Compliance with follow-up visit  Education Interventions   Education Provided Provided Education  [role of ED-- patient verbalizes concerns with ED visit,  said he was "rushed out because they didn't have any beds for other patients, " provided number to Patient Experience Department]  Provided Verbal Education On Other  [scope of ED services]  Pharmacy Interventions   Pharmacy Dicussed/Reviewed Pharmacy Topics Discussed  [declines medication review,  denies concerns/ questions around medications,  self-manages medications]      Caryl Pina, RN, BSN, CCRN Alumnus RN CM Care Coordination/ Transition of Care- [facility] Care Management [phone]: direct office

## 2023-02-20 NOTE — Assessment & Plan Note (Signed)
Hernia: Patient reports hernia with intermittent leakage. Recent consultation with general surgery who recommended management of ascites and liver disease prior to hernia repair. -Continue current management and follow up with GI specialist on August 2nd. -Ensure patient has contact information for general surgery.

## 2023-02-20 NOTE — Assessment & Plan Note (Signed)
Following with GI Well controlled with protonix and lifestyle measures - refill provided

## 2023-02-20 NOTE — Progress Notes (Signed)
Acute Office Visit  Subjective:     Patient ID: Nathan Roberts, male    DOB: 08-31-1960, 62 y.o.   MRN: [id number]  Chief Complaint  Patient presents with   Hospitalization Follow-up    HPI Patient is in today for  ER/hospital follow-up.    Discussed the use of AI scribe software for clinical note transcription with the patient, who gave verbal consent to proceed.  History of Present Illness   The patient, with a history of severe ascites and an umbilical hernia, presented for a follow-up after a recent ER visit where they underwent a paracentesis. Approximately 15 liters of fluid were removed during the procedure, which left the patient feeling dizzy and weak. They reported needing two days of bed rest to recover. The patient noted feeling significantly better after the procedure, with their abdomen not as tight as before. However, they expressed concern that not all the fluid was removed and dissatisfaction with the ER's handling of their case.  The patient also reported a slow, persistent leak from their hernia, which has been present for about four to five months. They noted that the hernia had reduced in size after the paracentesis but returned to its previous size a few hours later. The patient expressed frustration with the delay in hernia repair, which they attributed to the need for better control of their ascites.  The patient also reported being out of several prescriptions, including Pantoprazole, which they were instructed to take twice daily, and Lasix. They also mentioned a recent fall caused by their dog pulling on the leash, resulting in left rib pain. They denied any bruising, swelling, or difficulty breathing associated with the fall.       Wt Readings from Last 3 Encounters:  02/20/23 203 lb (92.1 kg)  01/10/23 215 lb (97.5 kg)  12/22/22 223 lb (101.2 kg)        ROS All review of systems negative except what is listed in the HPI      Objective:     BP 126/78   Pulse (!) 104   Ht 5\' 8"  (1.727 m)   Wt 203 lb (92.1 kg)   SpO2 98%   BMI 30.87 kg/m    Physical Exam Vitals reviewed.  Constitutional:      Appearance: Normal appearance.  Cardiovascular:     Rate and Rhythm: Normal rate and regular rhythm.     Pulses: Normal pulses.     Heart sounds: Normal heart sounds.  Pulmonary:     Effort: Pulmonary effort is normal.     Breath sounds: Normal breath sounds.  Abdominal:     General: There is distension.     Tenderness: There is no abdominal tenderness. There is no guarding.     Hernia: A hernia is present.     Comments: Significant distention and large umbilical hernia with mild leakage of ascites fluid (no skin discoloration or pain)  Musculoskeletal:     Comments: BLE 3+ edema with evidence of venous stasis dermatitis, no ulcerations noted  Skin:    General: Skin is warm and dry.  Neurological:     Mental Status: He is alert and oriented to person, place, and time.  Psychiatric:        Mood and Affect: Mood normal.        Behavior: Behavior normal.        Thought Content: Thought content normal.        Judgment: Judgment normal.  No results found for any visits on 02/20/23.      Assessment & Plan:   Problem List Items Addressed This Visit     GERD (gastroesophageal reflux disease) (Chronic)    Following with GI Well controlled with protonix and lifestyle measures - refill provided       Relevant Medications   pantoprazole (PROTONIX) 40 MG tablet   Umbilical hernia without obstruction and without gangrene    Hernia: Patient reports hernia with intermittent leakage. Recent consultation with general surgery who recommended management of ascites and liver disease prior to hernia repair. -Continue current management and follow up with GI specialist on August 2nd.       Ascites due to alcoholic cirrhosis (HCC) - Primary (Chronic)    Ascites: Recent large volume paracentesis (15 liters) with subsequent  post-procedure dizziness and fatigue. Currently feeling better and ambulating; back to baseline now. Referral to interventional radiology for future paracentesis has already been placed - he is waiting for a call from them. -Continue current management and follow up with GI specialist on August 2nd. -We need to repeat CMP, but he prefers to wait until GI appointment. Advised him to please let us know if this is not done so we can update them here.       Relevant Medications   furosemide (LASIX) 40 MG tablet   spironolactone (ALDACTONE) 25 MG tablet   Other Visit Diagnoses     Rib pain on left side     Possible Rib Injury: Patient reports recent fall with subsequent left rib pain. No visible bruising or swelling. No difficulty breathing. -Advise patient to apply ice initially, then heat after a couple of days. -If pain persists or worsens, consider X-ray to rule out fracture; he declined imaging today.         Meds ordered this encounter  Medications   furosemide (LASIX) 40 MG tablet    Sig: Take 1 tablet (40 mg total) by mouth 2 (two) times daily at 10 am and 4 pm.    Dispense:  180 tablet    Refill:  0   pantoprazole (PROTONIX) 40 MG tablet    Sig: Take 1 tablet (40 mg total) by mouth 2 (two) times daily before a meal.    Dispense:  180 tablet    Refill:  0   spironolactone (ALDACTONE) 25 MG tablet    Sig: Take 1 tablet (25 mg total) by mouth daily.    Dispense:  90 tablet    Refill:  0    Return in about 3 months (around 05/23/2023) for routine follow-up.  Clayborne Dana, NP

## 2023-03-02 DIAGNOSIS — K7290 Hepatic failure, unspecified without coma: Secondary | ICD-10-CM | POA: Diagnosis not present

## 2023-03-02 DIAGNOSIS — K7460 Unspecified cirrhosis of liver: Secondary | ICD-10-CM | POA: Diagnosis not present

## 2023-03-05 DIAGNOSIS — K7290 Hepatic failure, unspecified without coma: Secondary | ICD-10-CM | POA: Diagnosis not present

## 2023-03-05 DIAGNOSIS — K7460 Unspecified cirrhosis of liver: Secondary | ICD-10-CM | POA: Diagnosis not present

## 2023-03-05 DIAGNOSIS — K7031 Alcoholic cirrhosis of liver with ascites: Secondary | ICD-10-CM | POA: Diagnosis not present

## 2023-03-05 DIAGNOSIS — R188 Other ascites: Secondary | ICD-10-CM | POA: Diagnosis not present

## 2023-03-16 DIAGNOSIS — K7460 Unspecified cirrhosis of liver: Secondary | ICD-10-CM | POA: Diagnosis not present

## 2023-03-16 DIAGNOSIS — K7290 Hepatic failure, unspecified without coma: Secondary | ICD-10-CM | POA: Diagnosis not present

## 2023-03-20 DIAGNOSIS — R600 Localized edema: Secondary | ICD-10-CM | POA: Diagnosis not present

## 2023-03-20 DIAGNOSIS — I8511 Secondary esophageal varices with bleeding: Secondary | ICD-10-CM | POA: Diagnosis not present

## 2023-03-20 DIAGNOSIS — K7030 Alcoholic cirrhosis of liver without ascites: Secondary | ICD-10-CM | POA: Diagnosis not present

## 2023-03-20 DIAGNOSIS — Z6832 Body mass index (BMI) 32.0-32.9, adult: Secondary | ICD-10-CM | POA: Diagnosis not present

## 2023-03-21 ENCOUNTER — Other Ambulatory Visit: Payer: Self-pay

## 2023-03-21 ENCOUNTER — Emergency Department (HOSPITAL_COMMUNITY): Payer: 59

## 2023-03-21 ENCOUNTER — Inpatient Hospital Stay (HOSPITAL_COMMUNITY)
Admission: EM | Admit: 2023-03-21 | Discharge: 2023-03-30 | DRG: 871 | Disposition: A | Discharge status: Home Health | Payer: 59 | Attending: Family Medicine | Admitting: Family Medicine

## 2023-03-21 ENCOUNTER — Inpatient Hospital Stay (HOSPITAL_COMMUNITY): Payer: 59

## 2023-03-21 ENCOUNTER — Encounter (HOSPITAL_COMMUNITY): Payer: Self-pay | Admitting: Internal Medicine

## 2023-03-21 DIAGNOSIS — R140 Abdominal distension (gaseous): Secondary | ICD-10-CM | POA: Diagnosis not present

## 2023-03-21 DIAGNOSIS — M1990 Unspecified osteoarthritis, unspecified site: Secondary | ICD-10-CM | POA: Diagnosis present

## 2023-03-21 DIAGNOSIS — Z1152 Encounter for screening for COVID-19: Secondary | ICD-10-CM | POA: Diagnosis not present

## 2023-03-21 DIAGNOSIS — R578 Other shock: Secondary | ICD-10-CM | POA: Diagnosis present

## 2023-03-21 DIAGNOSIS — N179 Acute kidney failure, unspecified: Secondary | ICD-10-CM | POA: Diagnosis not present

## 2023-03-21 DIAGNOSIS — D62 Acute posthemorrhagic anemia: Secondary | ICD-10-CM | POA: Diagnosis not present

## 2023-03-21 DIAGNOSIS — Z79899 Other long term (current) drug therapy: Secondary | ICD-10-CM

## 2023-03-21 DIAGNOSIS — K7290 Hepatic failure, unspecified without coma: Secondary | ICD-10-CM | POA: Diagnosis present

## 2023-03-21 DIAGNOSIS — Z7984 Long term (current) use of oral hypoglycemic drugs: Secondary | ICD-10-CM

## 2023-03-21 DIAGNOSIS — E871 Hypo-osmolality and hyponatremia: Secondary | ICD-10-CM | POA: Diagnosis not present

## 2023-03-21 DIAGNOSIS — R680 Hypothermia, not associated with low environmental temperature: Secondary | ICD-10-CM | POA: Diagnosis present

## 2023-03-21 DIAGNOSIS — F1010 Alcohol abuse, uncomplicated: Secondary | ICD-10-CM | POA: Diagnosis present

## 2023-03-21 DIAGNOSIS — E875 Hyperkalemia: Secondary | ICD-10-CM | POA: Diagnosis present

## 2023-03-21 DIAGNOSIS — I10 Essential (primary) hypertension: Secondary | ICD-10-CM | POA: Diagnosis not present

## 2023-03-21 DIAGNOSIS — Y92000 Kitchen of unspecified non-institutional (private) residence as  the place of occurrence of the external cause: Secondary | ICD-10-CM | POA: Diagnosis not present

## 2023-03-21 DIAGNOSIS — K921 Melena: Secondary | ICD-10-CM | POA: Diagnosis present

## 2023-03-21 DIAGNOSIS — Z5941 Food insecurity: Secondary | ICD-10-CM

## 2023-03-21 DIAGNOSIS — R918 Other nonspecific abnormal finding of lung field: Secondary | ICD-10-CM | POA: Diagnosis not present

## 2023-03-21 DIAGNOSIS — T68XXXA Hypothermia, initial encounter: Secondary | ICD-10-CM | POA: Diagnosis present

## 2023-03-21 DIAGNOSIS — R932 Abnormal findings on diagnostic imaging of liver and biliary tract: Secondary | ICD-10-CM | POA: Diagnosis not present

## 2023-03-21 DIAGNOSIS — K7031 Alcoholic cirrhosis of liver with ascites: Secondary | ICD-10-CM | POA: Diagnosis not present

## 2023-03-21 DIAGNOSIS — G9340 Encephalopathy, unspecified: Secondary | ICD-10-CM | POA: Diagnosis not present

## 2023-03-21 DIAGNOSIS — R0602 Shortness of breath: Secondary | ICD-10-CM | POA: Diagnosis not present

## 2023-03-21 DIAGNOSIS — R278 Other lack of coordination: Secondary | ICD-10-CM | POA: Diagnosis present

## 2023-03-21 DIAGNOSIS — E8809 Other disorders of plasma-protein metabolism, not elsewhere classified: Secondary | ICD-10-CM | POA: Diagnosis not present

## 2023-03-21 DIAGNOSIS — D689 Coagulation defect, unspecified: Secondary | ICD-10-CM | POA: Diagnosis present

## 2023-03-21 DIAGNOSIS — K7682 Hepatic encephalopathy: Secondary | ICD-10-CM | POA: Diagnosis present

## 2023-03-21 DIAGNOSIS — A419 Sepsis, unspecified organism: Secondary | ICD-10-CM | POA: Diagnosis not present

## 2023-03-21 DIAGNOSIS — R188 Other ascites: Secondary | ICD-10-CM | POA: Diagnosis not present

## 2023-03-21 DIAGNOSIS — G9341 Metabolic encephalopathy: Secondary | ICD-10-CM | POA: Diagnosis not present

## 2023-03-21 DIAGNOSIS — D539 Nutritional anemia, unspecified: Secondary | ICD-10-CM | POA: Diagnosis present

## 2023-03-21 DIAGNOSIS — R531 Weakness: Principal | ICD-10-CM

## 2023-03-21 DIAGNOSIS — K766 Portal hypertension: Secondary | ICD-10-CM | POA: Diagnosis not present

## 2023-03-21 DIAGNOSIS — E785 Hyperlipidemia, unspecified: Secondary | ICD-10-CM | POA: Diagnosis present

## 2023-03-21 DIAGNOSIS — Z5986 Financial insecurity: Secondary | ICD-10-CM

## 2023-03-21 DIAGNOSIS — Z83438 Family history of other disorder of lipoprotein metabolism and other lipidemia: Secondary | ICD-10-CM

## 2023-03-21 DIAGNOSIS — W1830XA Fall on same level, unspecified, initial encounter: Secondary | ICD-10-CM | POA: Diagnosis not present

## 2023-03-21 DIAGNOSIS — E11649 Type 2 diabetes mellitus with hypoglycemia without coma: Secondary | ICD-10-CM | POA: Diagnosis not present

## 2023-03-21 DIAGNOSIS — I959 Hypotension, unspecified: Secondary | ICD-10-CM | POA: Diagnosis not present

## 2023-03-21 DIAGNOSIS — Z8249 Family history of ischemic heart disease and other diseases of the circulatory system: Secondary | ICD-10-CM

## 2023-03-21 DIAGNOSIS — I8500 Esophageal varices without bleeding: Secondary | ICD-10-CM | POA: Diagnosis not present

## 2023-03-21 DIAGNOSIS — K429 Umbilical hernia without obstruction or gangrene: Secondary | ICD-10-CM | POA: Diagnosis present

## 2023-03-21 DIAGNOSIS — D6959 Other secondary thrombocytopenia: Secondary | ICD-10-CM | POA: Diagnosis not present

## 2023-03-21 DIAGNOSIS — K3189 Other diseases of stomach and duodenum: Secondary | ICD-10-CM | POA: Diagnosis not present

## 2023-03-21 DIAGNOSIS — R609 Edema, unspecified: Secondary | ICD-10-CM | POA: Diagnosis not present

## 2023-03-21 DIAGNOSIS — Z811 Family history of alcohol abuse and dependence: Secondary | ICD-10-CM

## 2023-03-21 DIAGNOSIS — E43 Unspecified severe protein-calorie malnutrition: Secondary | ICD-10-CM | POA: Diagnosis present

## 2023-03-21 DIAGNOSIS — Z6825 Body mass index (BMI) 25.0-25.9, adult: Secondary | ICD-10-CM

## 2023-03-21 DIAGNOSIS — Z981 Arthrodesis status: Secondary | ICD-10-CM | POA: Diagnosis not present

## 2023-03-21 DIAGNOSIS — K7460 Unspecified cirrhosis of liver: Secondary | ICD-10-CM | POA: Diagnosis not present

## 2023-03-21 DIAGNOSIS — R6520 Severe sepsis without septic shock: Secondary | ICD-10-CM | POA: Diagnosis not present

## 2023-03-21 DIAGNOSIS — K7040 Alcoholic hepatic failure without coma: Secondary | ICD-10-CM | POA: Diagnosis present

## 2023-03-21 DIAGNOSIS — Z881 Allergy status to other antibiotic agents status: Secondary | ICD-10-CM

## 2023-03-21 DIAGNOSIS — R339 Retention of urine, unspecified: Secondary | ICD-10-CM | POA: Diagnosis not present

## 2023-03-21 DIAGNOSIS — K219 Gastro-esophageal reflux disease without esophagitis: Secondary | ICD-10-CM | POA: Diagnosis present

## 2023-03-21 DIAGNOSIS — I8510 Secondary esophageal varices without bleeding: Secondary | ICD-10-CM | POA: Diagnosis present

## 2023-03-21 DIAGNOSIS — R059 Cough, unspecified: Secondary | ICD-10-CM | POA: Diagnosis present

## 2023-03-21 DIAGNOSIS — S0993XA Unspecified injury of face, initial encounter: Secondary | ICD-10-CM | POA: Diagnosis not present

## 2023-03-21 DIAGNOSIS — Z5982 Transportation insecurity: Secondary | ICD-10-CM

## 2023-03-21 DIAGNOSIS — Z743 Need for continuous supervision: Secondary | ICD-10-CM | POA: Diagnosis not present

## 2023-03-21 DIAGNOSIS — F419 Anxiety disorder, unspecified: Secondary | ICD-10-CM | POA: Diagnosis present

## 2023-03-21 DIAGNOSIS — Z87891 Personal history of nicotine dependence: Secondary | ICD-10-CM

## 2023-03-21 DIAGNOSIS — Z91040 Latex allergy status: Secondary | ICD-10-CM

## 2023-03-21 DIAGNOSIS — F418 Other specified anxiety disorders: Secondary | ICD-10-CM | POA: Diagnosis not present

## 2023-03-21 DIAGNOSIS — Z7189 Other specified counseling: Secondary | ICD-10-CM | POA: Diagnosis not present

## 2023-03-21 DIAGNOSIS — D500 Iron deficiency anemia secondary to blood loss (chronic): Secondary | ICD-10-CM | POA: Diagnosis present

## 2023-03-21 DIAGNOSIS — Z515 Encounter for palliative care: Secondary | ICD-10-CM | POA: Diagnosis not present

## 2023-03-21 DIAGNOSIS — I9589 Other hypotension: Secondary | ICD-10-CM | POA: Diagnosis not present

## 2023-03-21 DIAGNOSIS — D509 Iron deficiency anemia, unspecified: Secondary | ICD-10-CM | POA: Diagnosis present

## 2023-03-21 DIAGNOSIS — Z789 Other specified health status: Secondary | ICD-10-CM | POA: Diagnosis present

## 2023-03-21 DIAGNOSIS — R110 Nausea: Secondary | ICD-10-CM | POA: Diagnosis not present

## 2023-03-21 DIAGNOSIS — K5730 Diverticulosis of large intestine without perforation or abscess without bleeding: Secondary | ICD-10-CM | POA: Diagnosis not present

## 2023-03-21 DIAGNOSIS — S199XXA Unspecified injury of neck, initial encounter: Secondary | ICD-10-CM | POA: Diagnosis not present

## 2023-03-21 DIAGNOSIS — I9581 Postprocedural hypotension: Secondary | ICD-10-CM | POA: Diagnosis not present

## 2023-03-21 DIAGNOSIS — D649 Anemia, unspecified: Secondary | ICD-10-CM | POA: Diagnosis not present

## 2023-03-21 LAB — CBC WITH DIFFERENTIAL/PLATELET
Abs Immature Granulocytes: 0.11 10*3/uL — ABNORMAL HIGH (ref 0.00–0.07)
Abs Immature Granulocytes: 0.06 10*3/uL (ref 0.00–0.07)
Basophils Absolute: 0 10*3/uL (ref 0.0–0.1)
Basophils Absolute: 0 10*3/uL (ref 0.0–0.1)
Basophils Relative: 0 %
Basophils Relative: 0 %
Eosinophils Absolute: 0.1 10*3/uL (ref 0.0–0.5)
Eosinophils Absolute: 0 10*3/uL (ref 0.0–0.5)
Eosinophils Relative: 1 %
Eosinophils Relative: 0 %
HCT: 24.8 % — ABNORMAL LOW (ref 39.0–52.0)
HCT: 20.3 % — ABNORMAL LOW (ref 39.0–52.0)
Hemoglobin: 7.7 g/dL — ABNORMAL LOW (ref 13.0–17.0)
Hemoglobin: 6.6 g/dL — CL (ref 13.0–17.0)
Immature Granulocytes: 1 %
Immature Granulocytes: 1 %
Lymphocytes Relative: 12 %
Lymphocytes Relative: 12 %
Lymphs Abs: 1.2 10*3/uL (ref 0.7–4.0)
Lymphs Abs: 1.1 10*3/uL (ref 0.7–4.0)
MCH: 33.2 pg (ref 26.0–34.0)
MCH: 33.8 pg (ref 26.0–34.0)
MCHC: 31 g/dL (ref 30.0–36.0)
MCHC: 32.5 g/dL (ref 30.0–36.0)
MCV: 106.9 fL — ABNORMAL HIGH (ref 80.0–100.0)
MCV: 104.1 fL — ABNORMAL HIGH (ref 80.0–100.0)
Monocytes Absolute: 1.4 10*3/uL — ABNORMAL HIGH (ref 0.1–1.0)
Monocytes Absolute: 0.9 10*3/uL (ref 0.1–1.0)
Monocytes Relative: 13 %
Monocytes Relative: 9 %
Neutro Abs: 7.6 10*3/uL (ref 1.7–7.7)
Neutro Abs: 7.4 10*3/uL (ref 1.7–7.7)
Neutrophils Relative %: 73 %
Neutrophils Relative %: 78 %
Platelets: 115 10*3/uL — ABNORMAL LOW (ref 150–400)
Platelets: 106 10*3/uL — ABNORMAL LOW (ref 150–400)
RBC: 2.32 MIL/uL — ABNORMAL LOW (ref 4.22–5.81)
RBC: 1.95 MIL/uL — ABNORMAL LOW (ref 4.22–5.81)
RDW: 16.9 % — ABNORMAL HIGH (ref 11.5–15.5)
RDW: 16.8 % — ABNORMAL HIGH (ref 11.5–15.5)
WBC: 10.4 10*3/uL (ref 4.0–10.5)
WBC: 9.5 10*3/uL (ref 4.0–10.5)
nRBC: 0 % (ref 0.0–0.2)
nRBC: 0 % (ref 0.0–0.2)

## 2023-03-21 LAB — COMPREHENSIVE METABOLIC PANEL
ALT: 29 U/L (ref 0–44)
ALT: 32 U/L (ref 0–44)
AST: 69 U/L — ABNORMAL HIGH (ref 15–41)
AST: 77 U/L — ABNORMAL HIGH (ref 15–41)
Albumin: 2 g/dL — ABNORMAL LOW (ref 3.5–5.0)
Albumin: 2.5 g/dL — ABNORMAL LOW (ref 3.5–5.0)
Alkaline Phosphatase: 84 U/L (ref 38–126)
Alkaline Phosphatase: 77 U/L (ref 38–126)
Anion gap: 16 — ABNORMAL HIGH (ref 5–15)
Anion gap: 14 (ref 5–15)
BUN: 50 mg/dL — ABNORMAL HIGH (ref 8–23)
BUN: 57 mg/dL — ABNORMAL HIGH (ref 8–23)
CO2: 14 mmol/L — ABNORMAL LOW (ref 22–32)
CO2: 15 mmol/L — ABNORMAL LOW (ref 22–32)
Calcium: 9 mg/dL (ref 8.9–10.3)
Calcium: 8.9 mg/dL (ref 8.9–10.3)
Chloride: 95 mmol/L — ABNORMAL LOW (ref 98–111)
Chloride: 95 mmol/L — ABNORMAL LOW (ref 98–111)
Creatinine, Ser: 1.29 mg/dL — ABNORMAL HIGH (ref 0.61–1.24)
Creatinine, Ser: 1.37 mg/dL — ABNORMAL HIGH (ref 0.61–1.24)
GFR, Estimated: >60 mL/min (ref >60)
GFR, Estimated: 58 mL/min — ABNORMAL LOW (ref >60)
Glucose, Bld: 93 mg/dL (ref 70–99)
Glucose, Bld: 87 mg/dL (ref 70–99)
Potassium: 5.6 mmol/L — ABNORMAL HIGH (ref 3.5–5.1)
Potassium: 6.3 mmol/L (ref 3.5–5.1)
Sodium: 125 mmol/L — ABNORMAL LOW (ref 135–145)
Sodium: 124 mmol/L — ABNORMAL LOW (ref 135–145)
Total Bilirubin: 3.6 mg/dL — ABNORMAL HIGH (ref 0.3–1.2)
Total Bilirubin: 3.8 mg/dL — ABNORMAL HIGH (ref 0.3–1.2)
Total Protein: 5.8 g/dL — ABNORMAL LOW (ref 6.5–8.1)
Total Protein: 5.8 g/dL — ABNORMAL LOW (ref 6.5–8.1)

## 2023-03-21 LAB — PROTIME-INR
INR: 2.3 — ABNORMAL HIGH (ref 0.8–1.2)
Prothrombin Time: 25.4 seconds — ABNORMAL HIGH (ref 11.4–15.2)

## 2023-03-21 LAB — AMMONIA: Ammonia: 62 umol/L — ABNORMAL HIGH (ref 9–35)

## 2023-03-21 LAB — BRAIN NATRIURETIC PEPTIDE: B Natriuretic Peptide: 54.1 pg/mL (ref 0.0–100.0)

## 2023-03-21 LAB — PREPARE RBC (CROSSMATCH)

## 2023-03-21 LAB — GLUCOSE, CAPILLARY: Glucose-Capillary: 86 mg/dL (ref 70–99)

## 2023-03-21 LAB — SARS CORONAVIRUS 2 BY RT PCR: SARS Coronavirus 2 by RT PCR: NEGATIVE

## 2023-03-21 MED ORDER — PIPERACILLIN-TAZOBACTAM 3.375 G IVPB
3.3750 g | Freq: Three times a day (TID) | INTRAVENOUS | Status: DC
  Administered 2023-03-21 – 2023-03-26 (×15): 3.375 g via INTRAVENOUS
  Filled 2023-03-21 (×15): qty 50

## 2023-03-21 MED ORDER — PANTOPRAZOLE 80MG IVPB - SIMPLE MED
80.0000 mg | Freq: Once | INTRAVENOUS | Status: AC
  Administered 2023-03-21: 80 mg via INTRAVENOUS
  Filled 2023-03-21: qty 80

## 2023-03-21 MED ORDER — LACTULOSE 10 GM/15ML PO SOLN
20.0000 g | Freq: Two times a day (BID) | ORAL | Status: DC
  Administered 2023-03-21: 20 g via ORAL
  Filled 2023-03-21 (×2): qty 30

## 2023-03-21 MED ORDER — ONDANSETRON HCL 4 MG PO TABS: 4.0000 mg | ORAL_TABLET | Freq: Four times a day (QID) | ORAL | Status: DC | PRN

## 2023-03-21 MED ORDER — PROPRANOLOL HCL 20 MG PO TABS: 10.0000 mg | ORAL_TABLET | Freq: Two times a day (BID) | ORAL | Status: DC

## 2023-03-21 MED ORDER — VANCOMYCIN HCL 2000 MG/400ML IV SOLN
2000.0000 mg | Freq: Once | INTRAVENOUS | Status: AC
  Administered 2023-03-21: 2000 mg via INTRAVENOUS
  Filled 2023-03-21: qty 400

## 2023-03-21 MED ORDER — INSULIN ASPART 100 UNIT/ML IJ SOLN
0.0000 [IU] | INTRAMUSCULAR | Status: DC
  Filled 2023-03-21: qty 0.06

## 2023-03-21 MED ORDER — ALBUMIN HUMAN 25 % IV SOLN
50.0000 g | Freq: Once | INTRAVENOUS | Status: AC
  Administered 2023-03-21: 50 g via INTRAVENOUS
  Filled 2023-03-21: qty 200

## 2023-03-21 MED ORDER — ALBUMIN HUMAN 25 % IV SOLN: 12.5000 g | Freq: Once | INTRAVENOUS | Status: DC

## 2023-03-21 MED ORDER — VANCOMYCIN HCL 1250 MG/250ML IV SOLN: 1250.0000 mg | INTRAVENOUS | Status: DC

## 2023-03-21 MED ORDER — LIDOCAINE HCL 1 % IJ SOLN
INTRAMUSCULAR | Status: AC
  Filled 2023-03-21: qty 20

## 2023-03-21 MED ORDER — ONDANSETRON HCL 4 MG/2ML IJ SOLN: 4.0000 mg | Freq: Four times a day (QID) | INTRAMUSCULAR | Status: DC | PRN

## 2023-03-21 MED ORDER — SODIUM CHLORIDE 0.9% IV SOLUTION: Freq: Once | INTRAVENOUS | Status: AC

## 2023-03-21 MED ORDER — SODIUM CHLORIDE 0.9% FLUSH
3.0000 mL | Freq: Two times a day (BID) | INTRAVENOUS | Status: DC
  Administered 2023-03-21 – 2023-03-30 (×13): 3 mL via INTRAVENOUS

## 2023-03-21 MED ORDER — ACETAMINOPHEN 650 MG RE SUPP: 325.0000 mg | Freq: Four times a day (QID) | RECTAL | Status: DC | PRN

## 2023-03-21 MED ORDER — ALBUMIN HUMAN 25 % IV SOLN
50.0000 g | Freq: Four times a day (QID) | INTRAVENOUS | Status: DC
  Administered 2023-03-21 – 2023-03-22 (×3): 50 g via INTRAVENOUS
  Filled 2023-03-21 (×2): qty 200

## 2023-03-21 MED ORDER — PANTOPRAZOLE SODIUM 40 MG IV SOLR
40.0000 mg | Freq: Two times a day (BID) | INTRAVENOUS | Status: DC
  Administered 2023-03-25 – 2023-03-27 (×5): 40 mg via INTRAVENOUS
  Filled 2023-03-21 (×5): qty 10

## 2023-03-21 MED ORDER — ACETAMINOPHEN 325 MG PO TABS: 325.0000 mg | ORAL_TABLET | Freq: Four times a day (QID) | ORAL | Status: DC | PRN

## 2023-03-21 MED ORDER — PANTOPRAZOLE INFUSION (NEW) - SIMPLE MED
8.0000 mg/h | INTRAVENOUS | Status: DC
  Administered 2023-03-21 – 2023-03-22 (×2): 8 mg/h via INTRAVENOUS
  Filled 2023-03-21 (×2): qty 80

## 2023-03-21 NOTE — ED Notes (Signed)
ED TO INPATIENT HANDOFF REPORT  ED Nurse Name and Phone #: Rebecca Eaton RN  S Name/Age/Gender Nathan Roberts 62 y.o. male Room/Bed: WA20/WA20  Code Status   Code Status: Prior  Home/SNF/Other Home Patient oriented to: self, place, time, and situation Is this baseline? Yes   Triage Complete: Triage complete  Chief Complaint Hypothermia [T68.XXXA]  Triage Note Pt arrives via [facility] from home after a mechanical fall with inability to getup off the floor. Denies head injury, LOC, or use of thinners. Pt presents with significant abdominal distention. Last paracentesis a month ago. Missed one x2 weeks ago.   Allergies Allergies  Allergen Reactions   Cephalexin Rash   Doxycycline Rash   Latex Hives and Rash    Level of Care/Admitting Diagnosis ED Disposition     ED Disposition  Admit   Condition  --   Comment  Hospital Area: [facility] [[id number]]  Level of Care: Stepdown [14]  Admit to SDU based on following criteria: Hemodynamic compromise or significant risk of instability:  Patient requiring short term acute titration and management of vasoactive drips, and invasive monitoring (i.e., CVP and Arterial line).  May admit patient to [facility] or [facility] if equivalent level of care is available:: Yes  Covid Evaluation: Asymptomatic - no recent exposure (last 10 days) testing not required  Diagnosis: Hypothermia [[id number]]  Admitting Physician: Rolly Salter [[id number]]  Attending Physician: Rolly Salter [[id number]]  Certification:: I certify there are rare and unusual circumstances requiring inpatient admission  Expected Medical Readiness: 03/26/2023          B Medical/Surgery History Past Medical History:  Diagnosis Date   Allergy    Arthritis    Carpal tunnel syndrome    Diabetes mellitus without complication (HCC)    Esophageal varices (HCC)    GERD (gastroesophageal reflux disease)    GI bleed    Hyperlipidemia    Hypertension     Portal hypertensive gastropathy (HCC)    Pre-diabetes    Past Surgical History:  Procedure Laterality Date   abscess     rectal area   CARPAL TUNNEL RELEASE     left   CARPAL TUNNEL RELEASE Right 07/07/2021   Procedure: RIGHT CARPAL TUNNEL RELEASE;  Surgeon: Cindee Salt, MD;  Location: [facility];  Service: Orthopedics;  Laterality: Right;   CERVICAL FUSION     COLONOSCOPY WITH PROPOFOL N/A 05/29/2022   Procedure: COLONOSCOPY WITH PROPOFOL;  Surgeon: Napoleon Form, MD;  Location: [facility];  Service: Gastroenterology;  Laterality: N/A;   ESOPHAGEAL BANDING  10/07/2020   Procedure: ESOPHAGEAL BANDING;  Surgeon: Napoleon Form, MD;  Location: [facility];  Service: Endoscopy;;   ESOPHAGOGASTRODUODENOSCOPY (EGD) WITH PROPOFOL N/A 10/07/2020   Procedure: ESOPHAGOGASTRODUODENOSCOPY (EGD) WITH PROPOFOL;  Surgeon: Napoleon Form, MD;  Location: [facility];  Service: Endoscopy;  Laterality: N/A;   ESOPHAGOGASTRODUODENOSCOPY (EGD) WITH PROPOFOL N/A 01/18/2021   Procedure: ESOPHAGOGASTRODUODENOSCOPY (EGD) WITH PROPOFOL;  Surgeon: Napoleon Form, MD;  Location: [facility];  Service: Endoscopy;  Laterality: N/A;   ESOPHAGOGASTRODUODENOSCOPY (EGD) WITH PROPOFOL N/A 05/29/2022   Procedure: ESOPHAGOGASTRODUODENOSCOPY (EGD) WITH PROPOFOL;  Surgeon: Napoleon Form, MD;  Location: [facility];  Service: Gastroenterology;  Laterality: N/A;   FASCIECTOMY Left 02/10/2021   Procedure: FASCIECTOMY RING FINGER AND SMALL FINGER OF LEFT HAND;  Surgeon: Cindee Salt, MD;  Location: [facility];  Service: Orthopedics;  Laterality: Left;   FASCIECTOMY Right 07/07/2021   Procedure: FASCIECTOMY RIGHT  1ST WEB, RIGHT RING FINGER, RIGHT SMALL FINGER;  Surgeon: Cindee Salt, MD;  Location: [facility];  Service: Orthopedics;  Laterality: Right;   SPINE SURGERY       A IV Location/Drains/Wounds Patient Lines/Drains/Airways Status     Active  Line/Drains/Airways     Name Placement date Placement time Site Days   Peripheral IV 01/10/23 20 G 1" Right Antecubital 01/10/23  2127  Antecubital  70   Peripheral IV 03/21/23 20 G 1.25" Left;Posterior Hand 03/21/23  1314  Hand  less than 1   Peripheral IV 03/21/23 20 G Posterior;Right Wrist 03/21/23  1800  Wrist  less than 1   Open Drain Right;Anterior Hand  07/07/21  1041  Hand  622   Wound / Incision (Open or Dehisced) 11/27/17 Non-pressure wound Leg Right cellulitis, scabbed, red/pink, open area on RLE inside of leg 11/27/17  --  Leg  1940            Intake/Output Last 24 hours No intake or output data in the 24 hours ending 03/21/23 1819  Labs/Imaging Results for orders placed or performed during the hospital encounter of 03/21/23 (from the past 48 hour(s))  Ammonia     Status: Abnormal   Collection Time: 03/21/23  1:13 PM  Result Value Ref Range   Ammonia 62 (H) 9 - 35 umol/L    Comment: Performed at [facility], [address] [place], [place] [address]  CBC with Differential     Status: Abnormal   Collection Time: 03/21/23  1:14 PM  Result Value Ref Range   WBC 10.4 4.0 - 10.5 K/uL   RBC 2.32 (L) 4.22 - 5.81 MIL/uL   Hemoglobin 7.7 (L) 13.0 - 17.0 g/dL   HCT 19.1 (L) 47.8 - 29.5 %   MCV 106.9 (H) 80.0 - 100.0 fL   MCH 33.2 26.0 - 34.0 pg   MCHC 31.0 30.0 - 36.0 g/dL   RDW 62.1 (H) 30.8 - 65.7 %   Platelets 115 (L) 150 - 400 K/uL   nRBC 0.0 0.0 - 0.2 %   Neutrophils Relative % 73 %   Neutro Abs 7.6 1.7 - 7.7 K/uL   Lymphocytes Relative 12 %   Lymphs Abs 1.2 0.7 - 4.0 K/uL   Monocytes Relative 13 %   Monocytes Absolute 1.4 (H) 0.1 - 1.0 K/uL   Eosinophils Relative 1 %   Eosinophils Absolute 0.1 0.0 - 0.5 K/uL   Basophils Relative 0 %   Basophils Absolute 0.0 0.0 - 0.1 K/uL   Immature Granulocytes 1 %   Abs Immature Granulocytes 0.11 (H) 0.00 - 0.07 K/uL    Comment: Performed at [facility], [address]  [place], [place] [address]  Comprehensive metabolic panel     Status: Abnormal   Collection Time: 03/21/23  1:14 PM  Result Value Ref Range   Sodium 125 (L) 135 - 145 mmol/L   Potassium 5.6 (H) 3.5 - 5.1 mmol/L   Chloride 95 (L) 98 - 111 mmol/L   CO2 14 (L) 22 - 32 mmol/L   Glucose, Bld 93 70 - 99 mg/dL    Comment: Glucose reference range applies only to samples taken after fasting for at least 8 hours.   BUN 50 (H) 8 - 23 mg/dL   Creatinine, Ser 2.95 (H) 0.61 - 1.24 mg/dL   Calcium 9.0 8.9 - 28.4 mg/dL   Total Protein 5.8 (L) 6.5 - 8.1 g/dL   Albumin 2.0 (L)  3.5 - 5.0 g/dL   AST 69 (H) 15 - 41 U/L   ALT 29 0 - 44 U/L   Alkaline Phosphatase 84 38 - 126 U/L   Total Bilirubin 3.6 (H) 0.3 - 1.2 mg/dL   GFR, Estimated >78 >29 mL/min    Comment: (NOTE) Calculated using the CKD-EPI Creatinine Equation (2021)    Anion gap 16 (H) 5 - 15    Comment: Performed at [facility], [address] [place], [place] [address]  Protime-INR     Status: Abnormal   Collection Time: 03/21/23  1:14 PM  Result Value Ref Range   Prothrombin Time 25.4 (H) 11.4 - 15.2 seconds   INR 2.3 (H) 0.8 - 1.2    Comment: (NOTE) INR goal varies based on device and disease states. Performed at [facility], [address], [place], [place] [address]   Brain natriuretic peptide     Status: None   Collection Time: 03/21/23  1:14 PM  Result Value Ref Range   B Natriuretic Peptide 54.1 0.0 - 100.0 pg/mL    Comment: Performed at [facility], [address], [place], [place] [address]  SARS Coronavirus 2 by RT PCR (hospital order, performed in [facility] hospital lab) *cepheid single result test* Anterior Nasal Swab     Status: None   Collection Time: 03/21/23  4:29 PM   Specimen: Anterior Nasal Swab  Result Value Ref Range   SARS Coronavirus 2 by RT PCR NEGATIVE NEGATIVE    Comment: (NOTE) SARS-CoV-2 target nucleic acids are NOT DETECTED.  The SARS-CoV-2 RNA  is generally detectable in upper and lower respiratory specimens during the acute phase of infection. The lowest concentration of SARS-CoV-2 viral copies this assay can detect is 250 copies / mL. A negative result does not preclude SARS-CoV-2 infection and should not be used as the sole basis for treatment or other patient management decisions.  A negative result may occur with improper specimen collection / handling, submission of specimen other than nasopharyngeal swab, presence of viral mutation(s) within the areas targeted by this assay, and inadequate number of viral copies (<250 copies / mL). A negative result must be combined with clinical observations, patient history, and epidemiological information.  Fact Sheet for Patients:   RoadLapTop.co.za  Fact Sheet for Healthcare Providers: http://kim-miller.com/  This test is not yet approved or  cleared by the [place] FDA and has been authorized for detection and/or diagnosis of SARS-CoV-2 by FDA under an Emergency Use Authorization (EUA).  This EUA will remain in effect (meaning this test can be used) for the duration of the COVID-19 declaration under Section 564(b)(1) of the Act, 21 U.S.C. section 360bbb-3(b)(1), unless the authorization is terminated or revoked sooner.  Performed at [facility], [address] [place], [place] [address]    US Paracentesis  Result Date: 03/21/2023 INDICATION: Patient with history of alcoholic cirrhosis, portal hypertension, refractory ascites; request received for therapeutic paracentesis EXAM: ULTRASOUND GUIDED THERAPEUTIC PARACENTESIS MEDICATIONS: 8 mL 1% lidocaine COMPLICATIONS: None immediate. PROCEDURE: Informed written consent was obtained from the patient after a discussion of the risks, benefits and alternatives to treatment. A timeout was performed prior to the initiation of the procedure. Initial ultrasound scanning  demonstrates a large amount of ascites within the right lower abdominal quadrant. The right lower abdomen was prepped and draped in the usual sterile fashion. 1% lidocaine was used for local anesthesia. Following this, a 19 gauge, 7-cm, Yueh catheter was  introduced. An ultrasound image was saved for documentation purposes. The paracentesis was performed. The catheter was removed and a dressing was applied. The patient tolerated the procedure well without immediate post procedural complication. Patient will tentatively receive post-procedure intravenous albumin in ED; see nursing notes for details. FINDINGS: A total of approximately 10 liters of slightly hazy, yellow fluid was removed. IMPRESSION: Successful ultrasound-guided therapeutic paracentesis yielding 10 liters of peritoneal fluid. PLAN: The patient has required >/=2 paracenteses in a 30 day period and a formal evaluation by the [facility] has been arranged. Performed by: Artemio Aly Electronically Signed   By: Simonne Come M.D.   On: 03/21/2023 16:21   CT HEAD WO CONTRAST ( )  Result Date: 03/21/2023 CLINICAL DATA:  Facial trauma, blunt; Neck trauma (Age >= 65y) EXAM: CT HEAD WITHOUT CONTRAST CT CERVICAL SPINE WITHOUT CONTRAST TECHNIQUE: Multidetector CT imaging of the head and cervical spine was performed following the standard protocol without intravenous contrast. Multiplanar CT image reconstructions of the cervical spine were also generated. RADIATION DOSE REDUCTION: This exam was performed according to the departmental dose-optimization program which includes automated exposure control, adjustment of the mA and/or kV according to patient size and/or use of iterative reconstruction technique. COMPARISON:  None Available. FINDINGS: CT HEAD FINDINGS Brain: No evidence of acute infarction, hemorrhage, hydrocephalus, extra-axial collection or mass lesion/mass effect. Vascular: No hyperdense  vessel or unexpected calcification. Skull: Normal. Negative for fracture or focal lesion. Sinuses/Orbits: No middle ear effusion. Polypoid mucosal maxillary. Orbits are Other: None CT CERVICAL SPINE FINDINGS Alignment: Normal. Skull base and vertebrae: No acute fracture. No primary bone lesion or focal pathologic process. Status post C4-C5 ACDF solid osseous fusion. Soft tissues and spinal canal: No prevertebral fluid or swelling. No visible canal hematoma. Disc levels:  No evidence of high-grade spinal canal stenosis. 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 Vascular findings suggestive of anemia. Other: None IMPRESSION: 1. No acute intracranial abnormality. 2. No acute fracture or traumatic malalignment of the cervical spine. Electronically Signed   By: Lorenza Cambridge M.D.   On: 03/21/2023 15:13   CT Cervical Spine Wo Contrast  Result Date: 03/21/2023 CLINICAL DATA:  Facial trauma, blunt; Neck trauma (Age >= 65y) EXAM: CT HEAD WITHOUT CONTRAST CT CERVICAL SPINE WITHOUT CONTRAST TECHNIQUE: Multidetector CT imaging of the head and cervical spine was performed following the standard protocol without intravenous contrast. Multiplanar CT image reconstructions of the cervical spine were also generated. RADIATION DOSE REDUCTION: This exam was performed according to the departmental dose-optimization program which includes automated exposure control, adjustment of the mA and/or kV according to patient size and/or use of iterative reconstruction technique. COMPARISON:  None Available. FINDINGS: CT HEAD FINDINGS Brain: No evidence of acute infarction, hemorrhage, hydrocephalus, extra-axial collection or mass lesion/mass effect. Vascular: No hyperdense vessel or unexpected calcification. Skull: Normal. Negative for fracture or focal lesion. Sinuses/Orbits: No middle ear effusion. Polypoid mucosal maxillary. Orbits are Other: None CT CERVICAL SPINE FINDINGS Alignment: Normal. Skull base and vertebrae: No acute  fracture. No primary bone lesion or focal pathologic process. Status post C4-C5 ACDF solid osseous fusion. Soft tissues and spinal canal: No prevertebral fluid or swelling. No visible canal hematoma. Disc levels:  No evidence of high-grade spinal canal stenosis. 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 Vascular findings suggestive of anemia. Other: None IMPRESSION: 1. No acute intracranial abnormality. 2. No acute fracture or traumatic malalignment of the cervical spine. Electronically Signed   By: Elige Radon.D.  On: 03/21/2023 15:13   DG Chest Portable 1 View  Result Date: 03/21/2023 CLINICAL DATA:  Shortness of breath pain after fall EXAM: PORTABLE CHEST 1 VIEW COMPARISON:  01/10/2023 x-ray and older FINDINGS: Poor inflation. Grossly no consolidation, pneumothorax or effusion. No edema. Normal cardiopericardial silhouette. Overall if there is concern further of sequela of trauma additional contrast CT as clinically appropriate. IMPRESSION: Poor inflation.  Grossly no acute cardiopulmonary disease Electronically Signed   By: Karen Kays M.D.   On: 03/21/2023 14:13    Pending Labs Unresulted Labs (From admission, onward)     Start     Ordered   03/21/23 1813  Culture, blood (Routine X 2) w Reflex to ID Panel  BLOOD CULTURE X 2,   R (with TIMED occurrences)      03/21/23 1812   03/21/23 1800  Type and screen [facility]  Once,   R       Comments: [facility]    03/21/23 1759   03/21/23 1759  CBC with Differential/Platelet  Once,   R        03/21/23 1758   03/21/23 1759  Comprehensive metabolic panel  Once,   R        03/21/23 1758            Vitals/Pain Today's Vitals   03/21/23 1630 03/21/23 1645 03/21/23 1708 03/21/23 1709  BP: 94/62 111/83  101/80  Pulse: (!) 101 99 97 96  Temp:   (!) 94.5 F (34.7 C)   TempSrc:   Rectal   SpO2: 100% 100% 100% 100%  Weight:      Height:      PainSc:        Isolation Precautions No  active isolations  Medications Medications  albumin human 25 % solution 50 g (has no administration in time range)  albumin human 25 % solution 50 g (50 g Intravenous New Bag/Given 03/21/23 1640)    Mobility walks with device     Focused Assessments     R Recommendations: See Admitting Provider Note  Report given to:   Additional Notes:

## 2023-03-21 NOTE — ED Provider Notes (Signed)
[place] EMERGENCY DEPARTMENT AT [facility] Provider Note   CSN: [id number] Arrival date & time: 03/21/23  1233     History HTN, alcoholic cirrhosis, DM Chief Complaint  Patient presents with   Abdominal Pain   Weakness    Nathan Roberts is a 62 y.o. male.  62 y.o male with a PMH hypertension, alcoholic cirrhosis, DM presents to the ED via EMS status post fall.  Patient reports he was in the kitchen trying to get his medication along with keys that were on the counter, states that he was unable to grab him therefore he fell to the ground.  He is unsure whether he struck his head, but reports he did not lose consciousness.  He reports he had not been able to get any sleep over the last 2 nights due to his severe abdominal distention, he does have a prior history of therapeutic paracentesis, last 1 done approximately a month ago.  He was scheduled for 1 2 weeks ago, however states that due to financial strain and its he has to provide co-pay, he did not go to this paracentesis.  Now he has had severe abdominal distention and discomfort with his umbilical hernia. He is not on any blood thinners. He denies any headache, shortness of breath or other complaints.   The history is provided by the patient.  Abdominal Pain Associated symptoms: no chest pain, no chills, no fever, no nausea, no shortness of breath and no vomiting   Weakness Associated symptoms: abdominal pain   Associated symptoms: no chest pain, no fever, no nausea, no shortness of breath and no vomiting        Home Medications Prior to Admission medications   Medication Sig Start Date End Date Taking? Authorizing Provider  furosemide (LASIX) 40 MG tablet Take 1 tablet (40 mg total) by mouth 2 (two) times daily at 10 am and 4 pm. 02/20/23 03/22/23  Clayborne Dana, NP  metFORMIN (GLUCOPHAGE) 500 MG tablet Take 500 mg by mouth daily.    [provider]  Multiple Vitamins-Minerals (MULTIVITAMIN WITH  MINERALS) tablet Take 1 tablet by mouth daily.    [provider]  pantoprazole (PROTONIX) 40 MG tablet Take 1 tablet (40 mg total) by mouth 2 (two) times daily before a meal. 02/20/23   Clayborne Dana, NP  propranolol (INDERAL) 10 MG tablet Take 1 tablet (10 mg total) by mouth 2 (two) times daily. 01/13/23 04/13/23  Lorin Glass, MD  spironolactone (ALDACTONE) 25 MG tablet Take 1 tablet (25 mg total) by mouth daily. 02/20/23 03/22/23  Clayborne Dana, NP      Allergies    Cephalexin, Doxycycline, and Latex    Review of Systems   Review of Systems  Constitutional:  Negative for chills and fever.  Respiratory:  Negative for shortness of breath.   Cardiovascular:  Negative for chest pain.  Gastrointestinal:  Positive for abdominal pain. Negative for nausea and vomiting.  Genitourinary:  Negative for flank pain.  Musculoskeletal:  Negative for back pain.  Neurological:  Positive for weakness. Negative for light-headedness.  All other systems reviewed and are negative.   Physical Exam Updated Vital Signs BP (!) 97/42   Pulse (!) 109   Temp 98.2 F (36.8 C) (Axillary)   Resp 15   Ht 5\' 8"  (1.727 m)   Wt 95.3 kg   SpO2 100%   BMI 31.93 kg/m  Physical Exam Vitals and nursing note reviewed.  Constitutional:  Appearance: He is ill-appearing.     Comments: Chronically ill appearing  Abdominal:     General: There is distension.     Palpations: Abdomen is soft. There is shifting dullness.     Tenderness: There is no abdominal tenderness.     Hernia: A hernia is present. Hernia is present in the umbilical area.     Comments: Distended, shifting dullness, large umbilical hernia  Neurological:     Mental Status: He is alert.     ED Results / Procedures / Treatments   Labs (all labs ordered are listed, but only abnormal results are displayed) Labs Reviewed  CBC WITH DIFFERENTIAL/PLATELET - Abnormal; Notable for the following components:      Result Value   RBC 2.32 (*)     Hemoglobin 7.7 (*)    HCT 24.8 (*)    MCV 106.9 (*)    RDW 16.9 (*)    Platelets 115 (*)    Monocytes Absolute 1.4 (*)    Abs Immature Granulocytes 0.11 (*)    All other components within normal limits  COMPREHENSIVE METABOLIC PANEL - Abnormal; Notable for the following components:   Sodium 125 (*)    Potassium 5.6 (*)    Chloride 95 (*)    CO2 14 (*)    BUN 50 (*)    Creatinine, Ser 1.29 (*)    Total Protein 5.8 (*)    Albumin 2.0 (*)    AST 69 (*)    Total Bilirubin 3.6 (*)    Anion gap 16 (*)    All other components within normal limits  AMMONIA - Abnormal; Notable for the following components:   Ammonia 62 (*)    All other components within normal limits  PROTIME-INR - Abnormal; Notable for the following components:   Prothrombin Time 25.4 (*)    INR 2.3 (*)    All other components within normal limits  CBC WITH DIFFERENTIAL/PLATELET - Abnormal; Notable for the following components:   RBC 1.95 (*)    Hemoglobin 6.6 (*)    HCT 20.3 (*)    MCV 104.1 (*)    RDW 16.8 (*)    Platelets 106 (*)    All other components within normal limits  COMPREHENSIVE METABOLIC PANEL - Abnormal; Notable for the following components:   Sodium 124 (*)    Potassium 6.3 (*)    Chloride 95 (*)    CO2 15 (*)    BUN 57 (*)    Creatinine, Ser 1.37 (*)    Total Protein 5.8 (*)    Albumin 2.5 (*)    AST 77 (*)    Total Bilirubin 3.8 (*)    GFR, Estimated 58 (*)    All other components within normal limits  SARS CORONAVIRUS 2 BY RT PCR  CULTURE, BLOOD (ROUTINE X 2)  CULTURE, BLOOD (ROUTINE X 2)  BRAIN NATRIURETIC PEPTIDE  GLUCOSE, CAPILLARY  HEMOGLOBIN A1C  COMPREHENSIVE METABOLIC PANEL  CBC  PROTIME-INR  TYPE AND SCREEN  PREPARE RBC (CROSSMATCH)    EKG None  Radiology US Paracentesis  Result Date: 03/21/2023 INDICATION: Patient with history of alcoholic cirrhosis, portal hypertension, refractory ascites; request received for therapeutic paracentesis EXAM: ULTRASOUND GUIDED  THERAPEUTIC PARACENTESIS MEDICATIONS: 8 mL 1% lidocaine COMPLICATIONS: None immediate. PROCEDURE: Informed written consent was obtained from the patient after a discussion of the risks, benefits and alternatives to treatment. A timeout was performed prior to the initiation of the procedure. Initial ultrasound scanning demonstrates a large amount of ascites  within the right lower abdominal quadrant. The right lower abdomen was prepped and draped in the usual sterile fashion. 1% lidocaine was used for local anesthesia. Following this, a 19 gauge, 7-cm, Yueh catheter was introduced. An ultrasound image was saved for documentation purposes. The paracentesis was performed. The catheter was removed and a dressing was applied. The patient tolerated the procedure well without immediate post procedural complication. Patient will tentatively receive post-procedure intravenous albumin in ED; see nursing notes for details. FINDINGS: A total of approximately 10 liters of slightly hazy, yellow fluid was removed. IMPRESSION: Successful ultrasound-guided therapeutic paracentesis yielding 10 liters of peritoneal fluid. PLAN: The patient has required >/=2 paracenteses in a 30 day period and a formal evaluation by the [facility] has been arranged. Performed by: Artemio Aly Electronically Signed   By: Simonne Come M.D.   On: 03/21/2023 16:21   CT HEAD WO CONTRAST ( )  Result Date: 03/21/2023 CLINICAL DATA:  Facial trauma, blunt; Neck trauma (Age >= 65y) EXAM: CT HEAD WITHOUT CONTRAST CT CERVICAL SPINE WITHOUT CONTRAST TECHNIQUE: Multidetector CT imaging of the head and cervical spine was performed following the standard protocol without intravenous contrast. Multiplanar CT image reconstructions of the cervical spine were also generated. RADIATION DOSE REDUCTION: This exam was performed according to the departmental dose-optimization program which includes automated exposure  control, adjustment of the mA and/or kV according to patient size and/or use of iterative reconstruction technique. COMPARISON:  None Available. FINDINGS: CT HEAD FINDINGS Brain: No evidence of acute infarction, hemorrhage, hydrocephalus, extra-axial collection or mass lesion/mass effect. Vascular: No hyperdense vessel or unexpected calcification. Skull: Normal. Negative for fracture or focal lesion. Sinuses/Orbits: No middle ear effusion. Polypoid mucosal maxillary. Orbits are Other: None CT CERVICAL SPINE FINDINGS Alignment: Normal. Skull base and vertebrae: No acute fracture. No primary bone lesion or focal pathologic process. Status post C4-C5 ACDF solid osseous fusion. Soft tissues and spinal canal: No prevertebral fluid or swelling. No visible canal hematoma. Disc levels:  No evidence of high-grade spinal canal stenosis. 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 Vascular findings suggestive of anemia. Other: None IMPRESSION: 1. No acute intracranial abnormality. 2. No acute fracture or traumatic malalignment of the cervical spine. Electronically Signed   By: Lorenza Cambridge M.D.   On: 03/21/2023 15:13   CT Cervical Spine Wo Contrast  Result Date: 03/21/2023 CLINICAL DATA:  Facial trauma, blunt; Neck trauma (Age >= 65y) EXAM: CT HEAD WITHOUT CONTRAST CT CERVICAL SPINE WITHOUT CONTRAST TECHNIQUE: Multidetector CT imaging of the head and cervical spine was performed following the standard protocol without intravenous contrast. Multiplanar CT image reconstructions of the cervical spine were also generated. RADIATION DOSE REDUCTION: This exam was performed according to the departmental dose-optimization program which includes automated exposure control, adjustment of the mA and/or kV according to patient size and/or use of iterative reconstruction technique. COMPARISON:  None Available. FINDINGS: CT HEAD FINDINGS Brain: No evidence of acute infarction, hemorrhage, hydrocephalus, extra-axial collection  or mass lesion/mass effect. Vascular: No hyperdense vessel or unexpected calcification. Skull: Normal. Negative for fracture or focal lesion. Sinuses/Orbits: No middle ear effusion. Polypoid mucosal maxillary. Orbits are Other: None CT CERVICAL SPINE FINDINGS Alignment: Normal. Skull base and vertebrae: No acute fracture. No primary bone lesion or focal pathologic process. Status post C4-C5 ACDF solid osseous fusion. Soft tissues and spinal canal: No prevertebral fluid or swelling. No visible canal hematoma. Disc levels:  No evidence of high-grade spinal canal stenosis. 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  Vascular findings suggestive of anemia. Other: None IMPRESSION: 1. No acute intracranial abnormality. 2. No acute fracture or traumatic malalignment of the cervical spine. Electronically Signed   By: Lorenza Cambridge M.D.   On: 03/21/2023 15:13   DG Chest Portable 1 View  Result Date: 03/21/2023 CLINICAL DATA:  Shortness of breath pain after fall EXAM: PORTABLE CHEST 1 VIEW COMPARISON:  01/10/2023 x-ray and older FINDINGS: Poor inflation. Grossly no consolidation, pneumothorax or effusion. No edema. Normal cardiopericardial silhouette. Overall if there is concern further of sequela of trauma additional contrast CT as clinically appropriate. IMPRESSION: Poor inflation.  Grossly no acute cardiopulmonary disease Electronically Signed   By: Karen Kays M.D.   On: 03/21/2023 14:13    Procedures Procedures    Medications Ordered in ED Medications  albumin human 25 % solution 50 g (50 g Intravenous New Bag/Given 03/21/23 1845)  insulin aspart (novoLOG) injection 0-6 Units ( Subcutaneous Not Given 03/21/23 1934)  sodium chloride flush (NS) 0.9 % injection 3 mL (has no administration in time range)  acetaminophen (TYLENOL) tablet 325 mg (has no administration in time range)    Or  acetaminophen (TYLENOL) suppository 325 mg (has no administration in time range)  ondansetron (ZOFRAN) tablet 4  mg (has no administration in time range)    Or  ondansetron (ZOFRAN) injection 4 mg (has no administration in time range)  pantoprozole (PROTONIX) 80 mg /NS 100 mL infusion (8 mg/hr Intravenous New Bag/Given 03/21/23 2014)  pantoprazole (PROTONIX) injection 40 mg (has no administration in time range)  piperacillin-tazobactam (ZOSYN) IVPB 3.375 g (has no administration in time range)  vancomycin (VANCOREADY) IVPB 2000 mg/400 mL (2,000 mg Intravenous New Bag/Given 03/21/23 2007)  propranolol (INDERAL) tablet 10 mg (has no administration in time range)  lactulose (CHRONULAC) 10 GM/15ML solution 20 g (has no administration in time range)  0.9 %  sodium chloride infusion (Manually program via Guardrails IV Fluids) (has no administration in time range)  vancomycin (VANCOREADY) IVPB 1250 mg/250 mL (has no administration in time range)  albumin human 25 % solution 50 g (50 g Intravenous New Bag/Given 03/21/23 1640)  pantoprazole (PROTONIX) 80 mg /NS 100 mL IVPB (0 mg Intravenous Stopped 03/21/23 2035)    ED Course/ Medical Decision Making/ A&P Clinical Course as of 03/21/23 2201  Wed Mar 21, 2023  1429 Ammonia(!): 62 [JS]    Clinical Course User Index [JS] Claude Manges, PA-C                                 Medical Decision Making Amount and/or Complexity of Data Reviewed Labs: ordered. Decision-making details documented in ED Course. Radiology: ordered.  Risk Prescription drug management. Decision regarding hospitalization.    This patient presents to the ED for concern of fall, abdominal distention, this involves a number of treatment options, and is a complaint that carries with it a high risk of complications and morbidity.  The differential diagnosis includes syncope versus mechanical fall versus electrolyte derangement.    Co morbidities: Discussed in HPI   Brief History:  See HPI.   EMR reviewed including pt PMHx, past surgical history and past visits to ER.   See HPI for  more details   Lab Tests:  I ordered and independently interpreted labs.  The pertinent results include:    CBC with no leukocytosis, denies any fever at home. Hgb is low compared to prior, he denies any source of bleeding.  CMP with electrolyte derangements such as hyponatremia, hyperkalemia, elevated BUN and creatine. LFTs with slight elevation of AST at 69, Small anion gap.    Imaging Studies:  CT Head/Cervical spine without any acute changes. Chest without any acute findings.   Medicines ordered:  I ordered medication including albumin  for post paracentesis  Reevaluation of the patient after these medicines showed that the patient improved I have reviewed the patients home medicines and have made adjustments as needed  Reevaluation:  After the interventions noted above I re-evaluated patient and found that they have :improved  Social Determinants of Health:  The patient's social determinants of health were a factor in the care of this patient  Problem List / ED Course:  Patient here status post mechanical fall as he was trying to reach for his keys on the kitchen table, reports falling to the ground did not strike his head, did not lose consciousness.  Has felt overall weak for the last couple of days.  Was supposed to have a paracentesis 2 weeks ago, however was unable to do so due to financial strain as he has to give a co-pay to this.  He continues to complain of abdominal distention, does have a known umbilical hernia which appears reducible on exam.  His vitals were stable on arrival, aside from core temperature was later checked rectally revealing a temperature of 93.1, patient was placed on a Bair hugger to help with warming. Blood work noted for CBC with a low hemoglobin of 7.7, patient does appear pale but denies any source of bleeding at this time, however does have a history of alcohol cirrhosis therefore concern for esophageal varices.  Electrolyte derangement  throughout, with decreased sodium, elevated potassium, worsening AKI along with elevated BUN.  He reports no longer drinking however AST slightly more elevated than ALT. Denies any nausea, vomiting, I had a low concern for SBP at that time.  Patient did receive a paracentesis approximately a month ago where they removed 15 L and he was able to tolerate this and be dispositioned home.  Attempted to have ultrasound paracentesis today.  After he returned from IR department, patient was hypotensive, given 50 g of albumin.  I do feel that he needs admission to the hospitalist service at this time.  I discussed this case with Dr. Debby Bud who agreed on admission.  Patient continues to be on a Lawyer with trending upwards of core temperature, left on antibiotics at this time.  Patient is hemodynamically stable for admission.  Dispostion:  After consideration of the diagnostic results and the patients response to treatment, I feel that the patent would benefit from admission. Spoke to [facility] hospitalist who will admit patient for further management.   Portions of this note were generated with Scientist, clinical (histocompatibility and immunogenetics). Dictation errors may occur despite best attempts at proofreading.   Final Clinical Impression(s) / ED Diagnoses Final diagnoses:  Weakness    Rx / DC Orders ED Discharge Orders     None         Claude Manges, PA-C 03/21/23 2201    Coral Spikes, DO 03/22/23 [phone]

## 2023-03-21 NOTE — ED Notes (Signed)
Pt returned from ultrasound paracentesis, reports improvement in abd pain and visible improvement of abd distention.

## 2023-03-21 NOTE — ED Notes (Signed)
Pt on bear hugger d/t low rectal temp of 93. Temp increased to 94.5.

## 2023-03-21 NOTE — H&P (Addendum)
History and Physical  Patient: Nathan Roberts WUJ:[id number] DOB: 07-May-1961 DOA: 03/21/2023 DOS: the patient was seen and examined on 03/21/2023 Patient coming from: Home  Chief Complaint: Fall ED TRIAGE note : "Pt arrives via [facility] from home after a mechanical fall with inability to getup off the floor. Denies head injury, LOC, or use of thinners. Pt presents with significant abdominal distention. Last paracentesis a month ago. Missed one x2 weeks ago. " HPI: Nathan Roberts is a 62 y.o. male with PMH significant of decompensated cirrhosis, anxiety, chronic anemia-iron deficiency, esophageal varices, history of alcohol abuse, refractory ascites. Presented to the hospital with complaints of mechanical fall. Patient was in his kitchen, his keys fell down.  In order to get down from the floor he lowered himself to the ground (he told EDP that he fell to the ground) and could not get himself up. He called his neighbor for help. Brought into the hospital with AMS. Patient gets repeated paracentesis and unable to get one due to financial issues and therefore reported to have some generalized weakness. He denies having any fever, cough. No nausea no vomiting. No bleeding reported, no melena. Reports having 8-9 bowel movements a day. Reports having compliant with medication other than pantoprazole. Denies any alcohol abuse right now and reports that last drink was 5 months ago. [facility] No other substance abuse reported by the patient.  Review of Systems: As mentioned in the history of present illness. All other systems reviewed and are negative.  Prior to Admission medications   Medication Sig Start Date End Date Taking? Authorizing Provider  furosemide (LASIX) 40 MG tablet Take 1 tablet (40 mg total) by mouth 2 (two) times daily at 10 am and 4 pm. 02/20/23 03/22/23  Clayborne Dana, NP  metFORMIN (GLUCOPHAGE) 500 MG tablet Take 500 mg by mouth daily.    [provider]  Multiple Vitamins-Minerals (MULTIVITAMIN WITH MINERALS) tablet Take 1 tablet by mouth daily.    [provider]  pantoprazole (PROTONIX) 40 MG tablet Take 1 tablet (40 mg total) by mouth 2 (two) times daily before a meal. 02/20/23   Clayborne Dana, NP  propranolol (INDERAL) 10 MG tablet Take 1 tablet (10 mg total) by mouth 2 (two) times daily. 01/13/23 04/13/23  Lorin Glass, MD  spironolactone (ALDACTONE) 25 MG tablet Take 1 tablet (25 mg total) by mouth daily. 02/20/23 03/22/23  Clayborne Dana, NP    Past Medical History:  Diagnosis Date   Allergy    Arthritis    Carpal tunnel syndrome    Diabetes mellitus without complication (HCC)    Esophageal varices (HCC)    GERD (gastroesophageal reflux disease)    GI bleed    Hyperlipidemia    Hypertension    Portal hypertensive gastropathy (HCC)    Pre-diabetes    Past Surgical History:  Procedure Laterality Date   abscess     rectal area   CARPAL TUNNEL RELEASE     left   CARPAL TUNNEL RELEASE Right 07/07/2021   Procedure: RIGHT CARPAL TUNNEL RELEASE;  Surgeon: Cindee Salt, MD;  Location: [facility];  Service: Orthopedics;  Laterality: Right;   CERVICAL FUSION     COLONOSCOPY WITH PROPOFOL N/A 05/29/2022   Procedure: COLONOSCOPY WITH PROPOFOL;  Surgeon: Napoleon Form, MD;  Location: [facility];  Service: Gastroenterology;  Laterality: N/A;   ESOPHAGEAL BANDING  10/07/2020   Procedure: ESOPHAGEAL BANDING;  Surgeon: Napoleon Form,  MD;  Location: [facility];  Service: Endoscopy;;   ESOPHAGOGASTRODUODENOSCOPY (EGD) WITH PROPOFOL N/A 10/07/2020   Procedure: ESOPHAGOGASTRODUODENOSCOPY (EGD) WITH PROPOFOL;  Surgeon: Napoleon Form, MD;  Location: [facility];  Service: Endoscopy;  Laterality: N/A;   ESOPHAGOGASTRODUODENOSCOPY (EGD) WITH PROPOFOL N/A 01/18/2021   Procedure: ESOPHAGOGASTRODUODENOSCOPY (EGD) WITH PROPOFOL;  Surgeon: Napoleon Form, MD;  Location: [facility];  Service:  Endoscopy;  Laterality: N/A;   ESOPHAGOGASTRODUODENOSCOPY (EGD) WITH PROPOFOL N/A 05/29/2022   Procedure: ESOPHAGOGASTRODUODENOSCOPY (EGD) WITH PROPOFOL;  Surgeon: Napoleon Form, MD;  Location: [facility];  Service: Gastroenterology;  Laterality: N/A;   FASCIECTOMY Left 02/10/2021   Procedure: FASCIECTOMY RING FINGER AND SMALL FINGER OF LEFT HAND;  Surgeon: Cindee Salt, MD;  Location: [facility];  Service: Orthopedics;  Laterality: Left;   FASCIECTOMY Right 07/07/2021   Procedure: FASCIECTOMY RIGHT 1ST WEB, RIGHT RING FINGER, RIGHT SMALL FINGER;  Surgeon: Cindee Salt, MD;  Location: [facility];  Service: Orthopedics;  Laterality: Right;   SPINE SURGERY     Social History:  reports that he quit smoking about 26 years ago. His smoking use included cigarettes. He has never used smokeless tobacco. He reports current alcohol use of about 9.0 standard drinks of alcohol per week. He reports that he does not use drugs. Allergies  Allergen Reactions   Cephalexin Rash   Doxycycline Rash   Latex Hives and Rash   Family History  Problem Relation Age of Onset   Early death Mother    Lung cancer Mother 2       lung--smoker   Alcohol abuse Mother    Atrial fibrillation Father    Heart disease Father    COPD Father    Hyperlipidemia Father    Hypertension Father    Arthritis Paternal Grandmother    Cancer Paternal Grandfather    Colon cancer Neg Hx    Esophageal cancer Neg Hx    Stomach cancer Neg Hx    Rectal cancer Neg Hx    Physical Exam: Vitals:   03/21/23 1630 03/21/23 1645 03/21/23 1708 03/21/23 1709  BP: 94/62 111/83  101/80  Pulse: (!) 101 99 97 96  Temp:   (!) 94.5 F (34.7 C)   TempSrc:   Rectal   SpO2: 100% 100% 100% 100%  Weight:      Height:       General: Appear in moderate distress; no visible Abnormal Neck Mass Or lumps, Conjunctiva normal Cardiovascular: S1 and S2 Present, no Murmur, Respiratory: good respiratory effort, Bilateral  Air entry present and CTA, no Crackles, no wheezes Abdomen: Bowel Sound present, distended, umbilical hernia present, nontender, Extremities: trace Pedal edema Neurology: alert and oriented to time, place, and person, generalized tremor present but difficult to ascertain presence of asterixis Gait not checked due to patient safety concerns   Data Reviewed:4 I have reviewed ED notes, Vitals, Lab results and outpatient records. Since last encounter, pertinent lab results CBC and BMP   . I have ordered test including , CBC, CMP, blood cultures, urine culture  . I have discussed pt's care plan and test results with ED provider  .   Assessment and Plan Hypothermia Etiology not clear. Check CBC. Continue Lawyer. Admit to stepdown unit. Given his presentation with a fall, hypotension as well as multiple electrolyte abnormality and organ damage with AKI will treat as potential sepsis. Initiate IV vancomycin and IV Zosyn. History of cefazolin allergy.  But tolerated Augmentin well. Monitor.  Decompensated hepatic cirrhosis (  HCC) Ascites due to alcoholic cirrhosis (HCC) History of Alcohol use Esophageal varices (HCC) Refractory ascites. CP class C. Underwent paracentesis.  Last paracentesis was in August 16 with 12.5 L removed.  Sees Dr. Carrie Mew at [facility]. EGD 10/23 grade 1 and small esophageal varices. TIPS was scheduled outpatient. Was on Cipro daily. Current paracentesis 10 L removed. In the setting of AKI at risk of further worsening with hypotension. Supposed to be on lactulose 30 mL 3 times daily. AFP 6.2 August 24. Will aggressively treat with IV albumin. GI consulted.  Fall at home No focal deficit. CT head C-spine negative for any acute fracture. Monitor for now.  Acute on chronic iron deficiency anemia due to chronic blood loss Hemoglobin 11 in August. Currently 7.7. Etiology not clear. Patient reports no bleeding. Will check Hemoccult. Protonix  drip. Clear liquid diet only. Transfuse for hemoglobin less than 7 or hemodynamic instability. Patient provided consent for blood transfusion.  Hyponatremia. Hyperkalemia Acute kidney injury. Sodium 125.  Prior sodium 131. Potassium 5.6. Serum creatinine 1.29.  Baseline 0.7. AKI most likely in the setting of ascites causing increased intra-abdominal pressure.  But at risk for hepatorenal syndrome as well. Will aggressively support with IV albumin. Elevated potassium most likely in the setting of ongoing use of Aldactone. Will recheck.  If remains elevated.  Will initiate Lokelma therapy, although patient reports 8-9 bowel movements a day. Hyponatremia mostly in the setting of poor p.o. intake as well as cirrhosis. Monitor for now.  Possible hepatic encephalopathy. Ammonia level 62. Asterixis possible. Will initiate lactulose therapy.  Chronic thrombocytopenia. Stable.  In the setting of alcohol cirrhosis. Monitor for now.  Hypertension Blood pressure soft. Currently holding medication.  Hyperlipidemia Holding statin for now.  Umbilical hernia without obstruction and without gangrene Monitor for now.  Currently no evidence of obstruction.  Advance Care Planning:   Code Status: Prior full code.  Will consult palliative care.  Patient does not have any designated healthcare power of attorney. Consults: Palliative care, [facility]. Family Communication: No one at bedside  Author: Lynden Oxford, MD 03/21/2023 6:09 PM For on call review [email or web address].

## 2023-03-21 NOTE — ED Triage Notes (Signed)
Pt arrives via [facility] from home after a mechanical fall with inability to getup off the floor. Denies head injury, LOC, or use of thinners. Pt presents with significant abdominal distention. Last paracentesis a month ago. Missed one x2 weeks ago.

## 2023-03-21 NOTE — Procedures (Addendum)
PROCEDURE SUMMARY:  Successful ultrasound guided paracentesis from the right lower quadrant.  Yielded 10 liters of slightly hazy, yellow fluid.  No immediate complications.  The patient tolerated the procedure well.   Specimen  was not sent for labs.  EBL none  The patient has required >/=2 paracenteses in a 30 day period and a screening evaluation by the [facility] has been arranged.   Artemio Aly

## 2023-03-21 NOTE — ED Notes (Signed)
Rectal temp verified x2. PA awre. Bair hugger placed and running.

## 2023-03-21 NOTE — Progress Notes (Signed)
Notified MD Allena Katz of hgb of 6.5 (critical result) at 1853. MD Allena Katz placing orders for transfusion. Reported off to [facility]. RN in regards to new orders and critical result.

## 2023-03-21 NOTE — ED Notes (Signed)
PA aware of BP. Pending orders.

## 2023-03-21 NOTE — Progress Notes (Signed)
Pharmacy Antibiotic Note  Nathan Roberts is a 62 y.o. male admitted on 03/21/2023 after a fall. Patient with abdominal distention, last paracentesis a month ago. Reports he missed one two weeks ago. He is s/p paracentesis 8/21 with removal of 10 L fluid. He presents with hypotension and hypothermia concerning for sepsis. Pharmacy has been consulted for vancomycin and Zosyn dosing.  Plan: -Vancomycin 2 g IV x 1 followed by 1250 mg IV q24h -Zosyn 3.375 g IV q8h extended infusion -Continue to follow renal function, cultures and clinical progress for dose adjustments and de-escalation  Height: 5\' 8"  (172.7 cm) Weight: 95.3 kg (210 lb) IBW/kg (Calculated) : 68.4  Temp (24hrs), Avg:95.3 F (35.2 C), Min:93.1 F (33.9 C), Max:97.4 F (36.3 C)  Recent Labs  Lab 03/21/23 1314 03/21/23 1818  WBC 10.4 9.5  CREATININE 1.29*  --     Estimated Creatinine Clearance: 64.8 mL/min (A) (by C-G formula based on SCr of 1.29 mg/dL (H)).    Allergies  Allergen Reactions   Cephalexin Rash   Doxycycline Rash   Latex Hives and Rash    Antimicrobials this admission: Vancomycin 8/21 >> Zosyn 8/21 >>  Dose adjustments this admission: NA  Microbiology results: 8/21 BCx: ordered   Thank you for allowing pharmacy to be a part of this patient's care.  Pricilla Riffle, PharmD, BCPS Clinical Pharmacist 03/21/2023 7:37 PM

## 2023-03-22 ENCOUNTER — Encounter (HOSPITAL_COMMUNITY): Payer: Self-pay | Admitting: Internal Medicine

## 2023-03-22 ENCOUNTER — Other Ambulatory Visit: Payer: Self-pay

## 2023-03-22 DIAGNOSIS — G9340 Encephalopathy, unspecified: Secondary | ICD-10-CM

## 2023-03-22 DIAGNOSIS — D62 Acute posthemorrhagic anemia: Secondary | ICD-10-CM

## 2023-03-22 DIAGNOSIS — K7460 Unspecified cirrhosis of liver: Secondary | ICD-10-CM

## 2023-03-22 DIAGNOSIS — K7682 Hepatic encephalopathy: Secondary | ICD-10-CM

## 2023-03-22 DIAGNOSIS — K7290 Hepatic failure, unspecified without coma: Secondary | ICD-10-CM | POA: Diagnosis not present

## 2023-03-22 DIAGNOSIS — Q453 Other congenital malformations of pancreas and pancreatic duct: Secondary | ICD-10-CM

## 2023-03-22 DIAGNOSIS — D539 Nutritional anemia, unspecified: Secondary | ICD-10-CM | POA: Diagnosis not present

## 2023-03-22 DIAGNOSIS — T68XXXA Hypothermia, initial encounter: Secondary | ICD-10-CM | POA: Diagnosis not present

## 2023-03-22 HISTORY — DX: Other congenital malformations of pancreas and pancreatic duct: Q45.3

## 2023-03-22 LAB — DIRECT ANTIGLOBULIN TEST (NOT AT ARMC)
DAT, IgG: NEGATIVE
DAT, complement: NEGATIVE

## 2023-03-22 LAB — GLUCOSE, CAPILLARY
Glucose-Capillary: 102 mg/dL — ABNORMAL HIGH (ref 70–99)
Glucose-Capillary: 62 mg/dL — ABNORMAL LOW (ref 70–99)
Glucose-Capillary: 71 mg/dL (ref 70–99)
Glucose-Capillary: 81 mg/dL (ref 70–99)
Glucose-Capillary: 88 mg/dL (ref 70–99)
Glucose-Capillary: 90 mg/dL (ref 70–99)
Glucose-Capillary: 94 mg/dL (ref 70–99)
Glucose-Capillary: 95 mg/dL (ref 70–99)

## 2023-03-22 LAB — CBC
HCT: 21.9 % — ABNORMAL LOW (ref 39.0–52.0)
HCT: 22.9 % — ABNORMAL LOW (ref 39.0–52.0)
HCT: 19.6 % — ABNORMAL LOW (ref 39.0–52.0)
Hemoglobin: 7.2 g/dL — ABNORMAL LOW (ref 13.0–17.0)
Hemoglobin: 7.9 g/dL — ABNORMAL LOW (ref 13.0–17.0)
Hemoglobin: 6.7 g/dL — CL (ref 13.0–17.0)
MCH: 32.1 pg (ref 26.0–34.0)
MCH: 32.9 pg (ref 26.0–34.0)
MCH: 32.5 pg (ref 26.0–34.0)
MCHC: 32.9 g/dL (ref 30.0–36.0)
MCHC: 34.5 g/dL (ref 30.0–36.0)
MCHC: 34.2 g/dL (ref 30.0–36.0)
MCV: 97.8 fL (ref 80.0–100.0)
MCV: 95.4 fL (ref 80.0–100.0)
MCV: 95.1 fL (ref 80.0–100.0)
Platelets: 77 10*3/uL — ABNORMAL LOW (ref 150–400)
Platelets: 67 10*3/uL — ABNORMAL LOW (ref 150–400)
Platelets: 54 10*3/uL — ABNORMAL LOW (ref 150–400)
RBC: 2.24 MIL/uL — ABNORMAL LOW (ref 4.22–5.81)
RBC: 2.4 MIL/uL — ABNORMAL LOW (ref 4.22–5.81)
RBC: 2.06 MIL/uL — ABNORMAL LOW (ref 4.22–5.81)
RDW: 17.2 % — ABNORMAL HIGH (ref 11.5–15.5)
RDW: 16.8 % — ABNORMAL HIGH (ref 11.5–15.5)
RDW: 17 % — ABNORMAL HIGH (ref 11.5–15.5)
WBC: 7.8 10*3/uL (ref 4.0–10.5)
WBC: 7.4 10*3/uL (ref 4.0–10.5)
WBC: 6.6 10*3/uL (ref 4.0–10.5)
nRBC: 0 % (ref 0.0–0.2)
nRBC: 0 % (ref 0.0–0.2)
nRBC: 0 % (ref 0.0–0.2)

## 2023-03-22 LAB — CBC WITH DIFFERENTIAL/PLATELET
Abs Immature Granulocytes: 0.03 10*3/uL (ref 0.00–0.07)
Basophils Absolute: 0 10*3/uL (ref 0.0–0.1)
Basophils Relative: 0 %
Eosinophils Absolute: 0.2 10*3/uL (ref 0.0–0.5)
Eosinophils Relative: 3 %
HCT: 17 % — ABNORMAL LOW (ref 39.0–52.0)
Hemoglobin: 5.5 g/dL — CL (ref 13.0–17.0)
Immature Granulocytes: 1 %
Lymphocytes Relative: 13 %
Lymphs Abs: 0.8 10*3/uL (ref 0.7–4.0)
MCH: 32.5 pg (ref 26.0–34.0)
MCHC: 32.4 g/dL (ref 30.0–36.0)
MCV: 100.6 fL — ABNORMAL HIGH (ref 80.0–100.0)
Monocytes Absolute: 0.9 10*3/uL (ref 0.1–1.0)
Monocytes Relative: 13 %
Neutro Abs: 4.5 10*3/uL (ref 1.7–7.7)
Neutrophils Relative %: 70 %
Platelets: 52 10*3/uL — ABNORMAL LOW (ref 150–400)
RBC: 1.69 MIL/uL — ABNORMAL LOW (ref 4.22–5.81)
RDW: 17.5 % — ABNORMAL HIGH (ref 11.5–15.5)
WBC: 6.4 10*3/uL (ref 4.0–10.5)
nRBC: 0 % (ref 0.0–0.2)

## 2023-03-22 LAB — COMPREHENSIVE METABOLIC PANEL
ALT: 28 U/L (ref 0–44)
AST: 71 U/L — ABNORMAL HIGH (ref 15–41)
Albumin: 3 g/dL — ABNORMAL LOW (ref 3.5–5.0)
Alkaline Phosphatase: 60 U/L (ref 38–126)
Anion gap: 9 (ref 5–15)
BUN: 60 mg/dL — ABNORMAL HIGH (ref 8–23)
CO2: 21 mmol/L — ABNORMAL LOW (ref 22–32)
Calcium: 8.9 mg/dL (ref 8.9–10.3)
Chloride: 97 mmol/L — ABNORMAL LOW (ref 98–111)
Creatinine, Ser: 1.25 mg/dL — ABNORMAL HIGH (ref 0.61–1.24)
GFR, Estimated: >60 mL/min (ref >60)
Glucose, Bld: 95 mg/dL (ref 70–99)
Potassium: 5.6 mmol/L — ABNORMAL HIGH (ref 3.5–5.1)
Sodium: 127 mmol/L — ABNORMAL LOW (ref 135–145)
Total Bilirubin: 4.2 mg/dL — ABNORMAL HIGH (ref 0.3–1.2)
Total Protein: 5.4 g/dL — ABNORMAL LOW (ref 6.5–8.1)

## 2023-03-22 LAB — VITAMIN B12: Vitamin B-12: 1073 pg/mL — ABNORMAL HIGH (ref 180–914)

## 2023-03-22 LAB — BASIC METABOLIC PANEL
Anion gap: 7 (ref 5–15)
BUN: 49 mg/dL — ABNORMAL HIGH (ref 8–23)
CO2: 23 mmol/L (ref 22–32)
Calcium: 9 mg/dL (ref 8.9–10.3)
Chloride: 101 mmol/L (ref 98–111)
Creatinine, Ser: 1.01 mg/dL (ref 0.61–1.24)
GFR, Estimated: >60 mL/min (ref >60)
Glucose, Bld: 98 mg/dL (ref 70–99)
Potassium: 4.4 mmol/L (ref 3.5–5.1)
Sodium: 131 mmol/L — ABNORMAL LOW (ref 135–145)

## 2023-03-22 LAB — BLOOD CULTURE ID PANEL (REFLEXED) - BCID2

## 2023-03-22 LAB — URINALYSIS, ROUTINE W REFLEX MICROSCOPIC
Bacteria, UA: NONE SEEN
Bilirubin Urine: NEGATIVE
Glucose, UA: NEGATIVE mg/dL
Ketones, ur: NEGATIVE mg/dL
Leukocytes,Ua: NEGATIVE
Nitrite: NEGATIVE
Protein, ur: NEGATIVE mg/dL
Specific Gravity, Urine: 1.019 (ref 1.005–1.030)
pH: 5 (ref 5.0–8.0)

## 2023-03-22 LAB — HEMOGLOBIN A1C
Hgb A1c MFr Bld: 4.6 % — ABNORMAL LOW (ref 4.8–5.6)
Mean Plasma Glucose: 85.32 mg/dL

## 2023-03-22 LAB — BODY FLUID CELL COUNT WITH DIFFERENTIAL
Lymphs, Fluid: 84 %
Monocyte-Macrophage-Serous Fluid: 11 % — ABNORMAL LOW (ref 50–90)
Neutrophil Count, Fluid: 4 % (ref 0–25)
Total Nucleated Cell Count, Fluid: 200 cu mm (ref 0–1000)

## 2023-03-22 LAB — FIBRINOGEN: Fibrinogen: 129 mg/dL — ABNORMAL LOW (ref 210–475)

## 2023-03-22 LAB — CORTISOL: Cortisol, Plasma: 9.1 ug/dL

## 2023-03-22 LAB — PHOSPHORUS: Phosphorus: 2.5 mg/dL (ref 2.5–4.6)

## 2023-03-22 LAB — IRON AND TIBC
Iron: 83 ug/dL (ref 45–182)
Saturation Ratios: 64 % — ABNORMAL HIGH (ref 17.9–39.5)
TIBC: 129 ug/dL — ABNORMAL LOW (ref 250–450)
UIBC: 46 ug/dL

## 2023-03-22 LAB — APTT: aPTT: 49 seconds — ABNORMAL HIGH (ref 24–36)

## 2023-03-22 LAB — PREPARE RBC (CROSSMATCH)

## 2023-03-22 LAB — PROTIME-INR
INR: 2.5 — ABNORMAL HIGH (ref 0.8–1.2)
Prothrombin Time: 26.9 s — ABNORMAL HIGH (ref 11.4–15.2)

## 2023-03-22 LAB — TECHNOLOGIST SMEAR REVIEW

## 2023-03-22 LAB — TSH: TSH: 1.179 u[IU]/mL (ref 0.350–4.500)

## 2023-03-22 LAB — MAGNESIUM: Magnesium: 2.1 mg/dL (ref 1.7–2.4)

## 2023-03-22 LAB — LACTIC ACID, PLASMA
Lactic Acid, Venous: 3.1 mmol/L (ref 0.5–1.9)
Lactic Acid, Venous: 2.9 mmol/L (ref 0.5–1.9)

## 2023-03-22 LAB — PROTEIN, PLEURAL OR PERITONEAL FLUID: Total protein, fluid: <3 g/dL

## 2023-03-22 LAB — FERRITIN: Ferritin: 67 ng/mL (ref 24–336)

## 2023-03-22 LAB — T4, FREE: Free T4: 2.45 ng/dL — ABNORMAL HIGH (ref 0.61–1.12)

## 2023-03-22 MED ORDER — MIDODRINE HCL 5 MG PO TABS
5.0000 mg | ORAL_TABLET | ORAL | Status: AC
  Administered 2023-03-22: 5 mg via ORAL
  Filled 2023-03-22: qty 1

## 2023-03-22 MED ORDER — SODIUM CHLORIDE 0.9 % IV BOLUS
500.0000 mL | Freq: Once | INTRAVENOUS | Status: AC
  Administered 2023-03-22: 500 mL via INTRAVENOUS

## 2023-03-22 MED ORDER — SODIUM ZIRCONIUM CYCLOSILICATE 10 G PO PACK
10.0000 g | PACK | Freq: Every day | ORAL | Status: DC
  Filled 2023-03-22: qty 1

## 2023-03-22 MED ORDER — SODIUM CHLORIDE 0.9 % IV SOLN: 1.0000 mg | Freq: Once | INTRAVENOUS | Status: DC

## 2023-03-22 MED ORDER — CHLORHEXIDINE GLUCONATE CLOTH 2 % EX PADS
6.0000 | MEDICATED_PAD | Freq: Every day | CUTANEOUS | Status: DC
  Administered 2023-03-22 – 2023-03-30 (×8): 6 via TOPICAL

## 2023-03-22 MED ORDER — LACTULOSE ENEMA
300.0000 mL | Freq: Two times a day (BID) | ORAL | Status: DC
  Administered 2023-03-22 – 2023-03-23 (×2): 300 mL via RECTAL
  Filled 2023-03-22 (×2): qty 300

## 2023-03-22 MED ORDER — NOREPINEPHRINE 4 MG/250ML-% IV SOLN
2.0000 ug/min | INTRAVENOUS | Status: DC
  Administered 2023-03-22: 2 ug/min via INTRAVENOUS
  Filled 2023-03-22: qty 250

## 2023-03-22 MED ORDER — DEXTROSE 50 % IV SOLN
12.5000 g | INTRAVENOUS | Status: AC
  Administered 2023-03-22: 12.5 g via INTRAVENOUS

## 2023-03-22 MED ORDER — CALCIUM GLUCONATE-NACL 1-0.675 GM/50ML-% IV SOLN
1.0000 g | Freq: Once | INTRAVENOUS | Status: AC
  Administered 2023-03-22: 1000 mg via INTRAVENOUS
  Filled 2023-03-22: qty 50

## 2023-03-22 MED ORDER — ALBUTEROL SULFATE (2.5 MG/3ML) 0.083% IN NEBU
5.0000 mg | INHALATION_SOLUTION | Freq: Once | RESPIRATORY_TRACT | Status: AC
  Administered 2023-03-22: 5 mg via RESPIRATORY_TRACT
  Filled 2023-03-22: qty 6

## 2023-03-22 MED ORDER — MIDODRINE HCL 5 MG PO TABS: 2.5000 mg | ORAL_TABLET | Freq: Three times a day (TID) | ORAL | Status: DC

## 2023-03-22 MED ORDER — ALBUMIN HUMAN 25 % IV SOLN
100.0000 g | Freq: Once | INTRAVENOUS | Status: AC
  Administered 2023-03-22: 100 g via INTRAVENOUS
  Filled 2023-03-22 (×2): qty 400

## 2023-03-22 MED ORDER — FOLIC ACID 5 MG/ML IJ SOLN
1.0000 mg | Freq: Once | INTRAMUSCULAR | Status: AC
  Administered 2023-03-22: 1 mg via INTRAVENOUS
  Filled 2023-03-22: qty 0.2

## 2023-03-22 MED ORDER — VITAMIN K1 10 MG/ML IJ SOLN
5.0000 mg | Freq: Once | INTRAVENOUS | Status: AC
  Administered 2023-03-22: 5 mg via INTRAVENOUS
  Filled 2023-03-22: qty 0.5

## 2023-03-22 MED ORDER — SODIUM CHLORIDE 0.9% IV SOLUTION: Freq: Once | INTRAVENOUS | Status: AC

## 2023-03-22 MED ORDER — DEXTROSE 50 % IV SOLN
INTRAVENOUS | Status: AC
  Filled 2023-03-22: qty 50

## 2023-03-22 MED ORDER — SODIUM CHLORIDE 0.9% FLUSH: 10.0000 mL | INTRAVENOUS | Status: DC | PRN

## 2023-03-22 MED ORDER — LACTULOSE ENEMA
300.0000 mL | Freq: Once | ORAL | Status: AC
  Administered 2023-03-22: 300 mL via RECTAL
  Filled 2023-03-22: qty 300

## 2023-03-22 MED ORDER — DEXTROSE 10 % IV SOLN: INTRAVENOUS | Status: AC

## 2023-03-22 MED ORDER — DEXTROSE 10 % IV SOLN: INTRAVENOUS | Status: DC

## 2023-03-22 MED ORDER — SODIUM POLYSTYRENE SULFONATE 15 GM/60ML PO SUSP
30.0000 g | Freq: Once | ORAL | Status: AC
  Administered 2023-03-22: 30 g via RECTAL
  Filled 2023-03-22: qty 120

## 2023-03-22 MED ORDER — SODIUM BICARBONATE 8.4 % IV SOLN
INTRAVENOUS | Status: AC
  Filled 2023-03-22: qty 50

## 2023-03-22 MED ORDER — SODIUM CHLORIDE 0.9% FLUSH
10.0000 mL | Freq: Two times a day (BID) | INTRAVENOUS | Status: DC
  Administered 2023-03-22 – 2023-03-23 (×4): 10 mL
  Administered 2023-03-24: 30 mL
  Administered 2023-03-24 – 2023-03-30 (×8): 10 mL

## 2023-03-22 MED ORDER — SODIUM CHLORIDE 0.9 % IV SOLN
250.0000 mL | INTRAVENOUS | Status: DC
  Administered 2023-03-22: 250 mL via INTRAVENOUS

## 2023-03-22 MED ORDER — SODIUM BICARBONATE 8.4 % IV SOLN
100.0000 meq | Freq: Once | INTRAVENOUS | Status: AC
  Administered 2023-03-22: 100 meq via INTRAVENOUS
  Filled 2023-03-22: qty 50

## 2023-03-22 NOTE — Progress Notes (Signed)
PICC Team RN  attempted to gain consent from patient on placement of PICC line. PT requires this line due to incompatibilities of medications given, prolonged IV therapy, and chronic illness. PT unable to give full consent and designated NOK did not answer the call. Primary care provider and another [facility] Tomasa Blase also present for this conversation.

## 2023-03-22 NOTE — Progress Notes (Addendum)
OT Cancellation Note  Patient Details Name: Nathan Roberts MRN: [id number] DOB: 03-25-1961   Cancelled Treatment:    Reason Eval/Treat Not Completed: Medical issues which prohibited therapy. Per conversation with PT, pt's nurse requested for therapy to hold, pending further discussion about pt's care options going forward.    Reuben Likes, OTR/L 03/22/2023, 3:16 PM

## 2023-03-22 NOTE — Plan of Care (Signed)
  Problem: Metabolic: Goal: Ability to maintain appropriate glucose levels will improve Outcome: Progressing   Problem: Clinical Measurements: Goal: Ability to maintain clinical measurements within normal limits will improve Outcome: Progressing Goal: Diagnostic test results will improve Outcome: Progressing   Problem: Fluid Volume: Goal: Ability to maintain a balanced intake and output will improve Outcome: Not Progressing   Problem: Health Behavior/Discharge Planning: Goal: Ability to identify and utilize available resources and services will improve Outcome: Not Progressing Goal: Ability to manage health-related needs will improve Outcome: Not Progressing   Problem: Nutritional: Goal: Maintenance of adequate nutrition will improve Outcome: Not Progressing Goal: Progress toward achieving an optimal weight will improve Outcome: Not Progressing   Problem: Skin Integrity: Goal: Risk for impaired skin integrity will decrease Outcome: Not Progressing   Problem: Tissue Perfusion: Goal: Adequacy of tissue perfusion will improve Outcome: Not Progressing   Problem: Education: Goal: Knowledge of General Education information will improve Description: Including pain rating scale, medication(s)/side effects and non-pharmacologic comfort measures Outcome: Not Progressing   Problem: Health Behavior/Discharge Planning: Goal: Ability to manage health-related needs will improve Outcome: Not Progressing   Problem: Clinical Measurements: Goal: Will remain free from infection Outcome: Not Progressing   Problem: Nutrition: Goal: Adequate nutrition will be maintained Outcome: Not Progressing   Problem: Coping: Goal: Level of anxiety will decrease Outcome: Not Progressing

## 2023-03-22 NOTE — Progress Notes (Signed)
eLink Physician-Brief Progress Note Patient Name: Nathan Roberts DOB: Oct 05, 1960 MRN: [id number]   Date of Service  03/22/2023  HPI/Events of Note  Hemoglobin 6.7 gm / dl. No evidence of active bleeding per bedside RN.  eICU Interventions  One unit of PRBC ordered, BID Protonix.        Migdalia Dk 03/22/2023, 11:54 PM

## 2023-03-22 NOTE — Consult Note (Signed)
[facility] CONSULT NOTE  Patient Care Team: Clayborne Dana, NP as PCP - General (Family Medicine) Clayborne Dana, NP (Family Medicine)  ASSESSMENT & PLAN:  Severe anemia, on background history of GI bleed I suspect he might have GI bleed/ or retroperitoneal bleed although this cannot be proven without performing additional workup For now, I recommend blood transfusion to keep hemoglobin above 7 There could also be a component of anemia chronic illness given high BUN and creatinine  Severe thrombocytopenia Multifactorial, could be due to consumption and worsening liver failure He does not need platelet transfusion right now  Decompensated liver failure, ascites, synthetic liver dysfunction Continue supportive care  Altered mental status I am wondering whether he might have spontaneous bacterial peritonitis Continue antibiotics  I will follow The total time spent in the appointment was 60 minutes encounter with patients including review of chart and various tests results, discussions about plan of care and coordination of care plan  Artis Delay, MD 8/22/20242:28 PM   CHIEF COMPLAINTS/PURPOSE OF CONSULTATION:  Anemia  HISTORY OF PRESENTING ILLNESS:  Nathan Roberts 62 y.o. male is seen here because of anemia The patient has altered mental status. I was not able to get any history.  Information is obtained through chart review.  He was found to have abnormal CBC from recent blood draw He has complicated history of decompensated liver disease and is known to have esophageal varices. I have the opportunity to review his CBC dated back to 2019.  His last normal hemoglobin was 13.1 on November 27, 2017.  Over the last few years, he has progressive thrombocytopenia In 2022, he was noted to have hemoglobin of 5.6.  He was hospitalized and received transfusion.  At that time, the cause of his severe anemia is likely due to daily use of Alka-Seltzer and NSAID with  ibuprofen. On January 10, 2023, his blood count was near normal.  White blood cell count 5.6, hemoglobin 12.9 and platelet count 146  He has been receiving regular paracentesis for recurrent abdominal ascites Yesterday, his hemoglobin was 7.7 with a platelet count of 115 Later, his hemoglobin dropped to 6.6 and platelet count reduced to 106 Repeat CBC today show hemoglobin of 7.2 at 3:00 and then 5.5 at 9:00 this morning Platelet count also steadily reduced to 77, to 52 There were no observed active bleeding from patient or nursing staff He was brought in by EMS after the patient fell at home. He was evaluated for trauma since admission He has been receiving care from [facility] and had repeated paracentesis for large volume ascites.  He probably had history of alcoholic liver cirrhosis with decompensated liver failure as a cause of his underlying condition.  MEDICAL HISTORY:  Past Medical History:  Diagnosis Date   Allergy    Arthritis    Carpal tunnel syndrome    Diabetes mellitus without complication (HCC)    Esophageal varices (HCC)    GERD (gastroesophageal reflux disease)    GI bleed    Hyperlipidemia    Hypertension    Pancreas divisum 03/22/2023   Portal hypertensive gastropathy (HCC)    Pre-diabetes     SURGICAL HISTORY: Past Surgical History:  Procedure Laterality Date   abscess     rectal area   CARPAL TUNNEL RELEASE     left   CARPAL TUNNEL RELEASE Right 07/07/2021   Procedure: RIGHT CARPAL TUNNEL RELEASE;  Surgeon: Cindee Salt, MD;  Location: [facility];  Service: Orthopedics;  Laterality: Right;   CERVICAL FUSION     COLONOSCOPY WITH PROPOFOL N/A 05/29/2022   Procedure: COLONOSCOPY WITH PROPOFOL;  Surgeon: Napoleon Form, MD;  Location: [facility];  Service: Gastroenterology;  Laterality: N/A;   ESOPHAGEAL BANDING  10/07/2020   Procedure: ESOPHAGEAL BANDING;  Surgeon: Napoleon Form, MD;  Location: [facility];  Service: Endoscopy;;    ESOPHAGOGASTRODUODENOSCOPY (EGD) WITH PROPOFOL N/A 10/07/2020   Procedure: ESOPHAGOGASTRODUODENOSCOPY (EGD) WITH PROPOFOL;  Surgeon: Napoleon Form, MD;  Location: [facility];  Service: Endoscopy;  Laterality: N/A;   ESOPHAGOGASTRODUODENOSCOPY (EGD) WITH PROPOFOL N/A 01/18/2021   Procedure: ESOPHAGOGASTRODUODENOSCOPY (EGD) WITH PROPOFOL;  Surgeon: Napoleon Form, MD;  Location: [facility];  Service: Endoscopy;  Laterality: N/A;   ESOPHAGOGASTRODUODENOSCOPY (EGD) WITH PROPOFOL N/A 05/29/2022   Procedure: ESOPHAGOGASTRODUODENOSCOPY (EGD) WITH PROPOFOL;  Surgeon: Napoleon Form, MD;  Location: [facility];  Service: Gastroenterology;  Laterality: N/A;   FASCIECTOMY Left 02/10/2021   Procedure: FASCIECTOMY RING FINGER AND SMALL FINGER OF LEFT HAND;  Surgeon: Cindee Salt, MD;  Location: [facility];  Service: Orthopedics;  Laterality: Left;   FASCIECTOMY Right 07/07/2021   Procedure: FASCIECTOMY RIGHT 1ST WEB, RIGHT RING FINGER, RIGHT SMALL FINGER;  Surgeon: Cindee Salt, MD;  Location: [facility];  Service: Orthopedics;  Laterality: Right;   SPINE SURGERY      SOCIAL HISTORY: Social History   Socioeconomic History   Marital status: Divorced    Spouse name: Not on file   Number of children: Not on file   Years of education: Not on file   Highest education level: Not on file  Occupational History   Not on file  Tobacco Use   Smoking status: Former    Current packs/day: 0.00    Types: Cigarettes    Quit date: 12/08/1996    Years since quitting: 26.3   Smokeless tobacco: Never  Vaping Use   Vaping status: Never Used  Substance and Sexual Activity   Alcohol use: Yes    Alcohol/week: 9.0 standard drinks of alcohol    Types: 2 Glasses of wine, 5 Cans of beer, 1 Shots of liquor, 1 Standard drinks or equivalent per week   Drug use: No   Sexual activity: Not Currently  Other Topics Concern   Not on file  Social History Narrative   Not on file    Social Determinants of Health   Financial Resource Strain: Not on file  Food Insecurity: Food Insecurity Present (03/21/2023)   Hunger Vital Sign    Worried About Running Out of Food in the Last Year: Sometimes true    Ran Out of Food in the Last Year: Sometimes true  Transportation Needs: Unmet Transportation Needs (03/21/2023)   PRAPARE - Administrator, Civil Service (Medical): Yes    Lack of Transportation (Non-Medical): Yes  Physical Activity: Not on file  Stress: Not on file  Social Connections: Unknown (12/09/2021)   Received from [facility]   Social Network    Social Network: Not on file  Intimate Partner Violence: Not At Risk (03/21/2023)   Humiliation, Afraid, Rape, and Kick questionnaire    Fear of Current or Ex-Partner: No    Emotionally Abused: No    Physically Abused: No    Sexually Abused: No    FAMILY HISTORY: Family History  Problem Relation Age of Onset   Early death Mother    Lung cancer Mother 58       lung--smoker   Alcohol  abuse Mother    Atrial fibrillation Father    Heart disease Father    COPD Father    Hyperlipidemia Father    Hypertension Father    Arthritis Paternal Grandmother    Cancer Paternal Grandfather    Colon cancer Neg Hx    Esophageal cancer Neg Hx    Stomach cancer Neg Hx    Rectal cancer Neg Hx     ALLERGIES:  is allergic to cephalexin, doxycycline, and latex.  MEDICATIONS:  Current Facility-Administered Medications  Medication Dose Route Frequency Provider Last Rate Last Admin   0.9 %  sodium chloride infusion  250 mL Intravenous Continuous Selmer Dominion B, NP       albumin human 25 % solution 100 g  100 g Intravenous Once Selmer Dominion B, NP       Chlorhexidine Gluconate Cloth 2 % PADS 6 each  6 each Topical Daily Rolly Salter, MD   6 each at 03/22/23 1407   dextrose 10 % infusion   Intravenous Continuous Rolly Salter, MD 40 mL/hr at 03/22/23 1200 Restarted at 03/22/23 1200   lactulose  (CHRONULAC) enema 200 gm  300 mL Rectal BID Selmer Dominion B, NP       norepinephrine (LEVOPHED) 4mg  in (0.016 mg/mL) premix infusion  2-10 mcg/min Intravenous Titrated Selmer Dominion B, NP   Held at 03/22/23 1223   ondansetron (ZOFRAN) injection 4 mg  4 mg Intravenous Q6H PRN Rolly Salter, MD       [START ON 03/25/2023] pantoprazole (PROTONIX) injection 40 mg  40 mg Intravenous Q12H Rolly Salter, MD       phytonadione (VITAMIN K) 5 mg in dextrose 5 % 50 mL IVPB  5 mg Intravenous Once Rolly Salter, MD   Held at 03/22/23 0902   piperacillin-tazobactam (ZOSYN) IVPB 3.375 g  3.375 g Intravenous Q8H [place], Leane Para, RPH   Stopped at 03/22/23 1023   sodium chloride flush (NS) 0.9 % injection 3 mL  3 mL Intravenous Q12H Rolly Salter, MD   3 mL at 03/22/23 1208   sodium zirconium cyclosilicate (LOKELMA) packet 10 g  10 g Oral Daily Rolly Salter, MD        REVIEW OF SYSTEMS: Unable to obtain due to altered mental status  PHYSICAL EXAMINATION: ECOG PERFORMANCE STATUS: 4 - Bedbound  Vitals:   03/22/23 1300 03/22/23 1400  BP: (!) 100/51 (!) 104/58  Pulse: (!) 105 100  Resp: (!) 21 19  Temp:  97.6 F (36.4 C)  SpO2: 97% 96%   Filed Weights   03/21/23 1244  Weight: 210 lb (95.3 kg)    GENERAL:alert, but not oriented SKIN: He has bruises in his arm.  He looks jaundiced EYES: normal, conjunctiva icterus is noted OROPHARYNX: Noted dry blood on his lips NECK: supple, thyroid normal size, non-tender, without nodularity LYMPH:  no palpable lymphadenopathy in the cervical, axillary or inguinal LUNGS: clear to auscultation and percussion with normal breathing effort HEART: regular rate & rhythm soft systolic murmur and bilateral lower extremity edema ABDOMEN:abdomen is distended with signs of ascites  PSYCH: alert but not oriented NEURO: Unable to assess  RADIOGRAPHIC STUDIES: I have personally reviewed the radiological images as listed and agreed with the findings in the  report. [place] EKG SITE RITE  Result Date: 03/22/2023 If [facility] image not attached, placement could not be confirmed due to current cardiac rhythm.  CT ABDOMEN PELVIS WO CONTRAST  Result Date: 03/22/2023 CLINICAL DATA:  Retroperitoneal bleed suspected. Fall, abdominal distention. EXAM: CT ABDOMEN AND PELVIS WITHOUT CONTRAST TECHNIQUE: Multidetector CT imaging of the abdomen and pelvis was performed following the standard protocol without IV contrast. RADIATION DOSE REDUCTION: This exam was performed according to the departmental dose-optimization program which includes automated exposure control, adjustment of the mA and/or kV according to patient size and/or use of iterative reconstruction technique. COMPARISON:  01/10/2023 FINDINGS: Lower chest: Bibasilar atelectasis. Coronary artery and aortic atherosclerosis. Hepatobiliary: Nodular, shrunken liver compatible with cirrhosis. Gallbladder contracted, grossly unremarkable. Pancreas: No focal abnormality or ductal dilatation. Spleen: No focal abnormality.  Normal size. Adrenals/Urinary Tract: No adrenal abnormality. No focal renal abnormality. No stones or hydronephrosis. Urinary bladder is unremarkable. Stomach/Bowel: Diffuse colonic diverticulosis. No active diverticulitis. Stomach and small bowel decompressed. No bowel obstruction. Vascular/Lymphatic: Aortic atherosclerosis. No evidence of aneurysm or adenopathy. Reproductive: No visible focal abnormality. Other: Large volume ascites. Moderate-sized umbilical hernia containing ascites, stable. Musculoskeletal: No acute bony abnormality. IMPRESSION: Cirrhosis with associated large volume ascites. Colonic diverticulosis. Umbilical hernia containing fluid. Aortic atherosclerosis. Electronically Signed   By: Charlett Nose M.D.   On: 03/22/2023 00:47   US Paracentesis  Result Date: 03/21/2023 INDICATION: Patient with history of alcoholic cirrhosis, portal hypertension, refractory ascites; request received for  therapeutic paracentesis EXAM: ULTRASOUND GUIDED THERAPEUTIC PARACENTESIS MEDICATIONS: 8 mL 1% lidocaine COMPLICATIONS: None immediate. PROCEDURE: Informed written consent was obtained from the patient after a discussion of the risks, benefits and alternatives to treatment. A timeout was performed prior to the initiation of the procedure. Initial ultrasound scanning demonstrates a large amount of ascites within the right lower abdominal quadrant. The right lower abdomen was prepped and draped in the usual sterile fashion. 1% lidocaine was used for local anesthesia. Following this, a 19 gauge, 7-cm, Yueh catheter was introduced. An ultrasound image was saved for documentation purposes. The paracentesis was performed. The catheter was removed and a dressing was applied. The patient tolerated the procedure well without immediate post procedural complication. Patient will tentatively receive post-procedure intravenous albumin in ED; see nursing notes for details. FINDINGS: A total of approximately 10 liters of slightly hazy, yellow fluid was removed. IMPRESSION: Successful ultrasound-guided therapeutic paracentesis yielding 10 liters of peritoneal fluid. PLAN: The patient has required >/=2 paracenteses in a 30 day period and a formal evaluation by the [facility] has been arranged. Performed by: Artemio Aly Electronically Signed   By: Simonne Come M.D.   On: 03/21/2023 16:21   CT HEAD WO CONTRAST ( )  Result Date: 03/21/2023 CLINICAL DATA:  Facial trauma, blunt; Neck trauma (Age >= 65y) EXAM: CT HEAD WITHOUT CONTRAST CT CERVICAL SPINE WITHOUT CONTRAST TECHNIQUE: Multidetector CT imaging of the head and cervical spine was performed following the standard protocol without intravenous contrast. Multiplanar CT image reconstructions of the cervical spine were also generated. RADIATION DOSE REDUCTION: This exam was performed according to the departmental  dose-optimization program which includes automated exposure control, adjustment of the mA and/or kV according to patient size and/or use of iterative reconstruction technique. COMPARISON:  None Available. FINDINGS: CT HEAD FINDINGS Brain: No evidence of acute infarction, hemorrhage, hydrocephalus, extra-axial collection or mass lesion/mass effect. Vascular: No hyperdense vessel or unexpected calcification. Skull: Normal. Negative for fracture or focal lesion. Sinuses/Orbits: No middle ear effusion. Polypoid mucosal maxillary. Orbits are Other: None CT CERVICAL SPINE FINDINGS Alignment: Normal. Skull base and vertebrae: No acute fracture. No primary bone lesion or focal pathologic process. Status post C4-C5 ACDF solid osseous fusion. Soft tissues and  spinal canal: No prevertebral fluid or swelling. No visible canal hematoma. Disc levels:  No evidence of high-grade spinal canal stenosis. 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 Vascular findings suggestive of anemia. Other: None IMPRESSION: 1. No acute intracranial abnormality. 2. No acute fracture or traumatic malalignment of the cervical spine. Electronically Signed   By: Lorenza Cambridge M.D.   On: 03/21/2023 15:13   CT Cervical Spine Wo Contrast  Result Date: 03/21/2023 CLINICAL DATA:  Facial trauma, blunt; Neck trauma (Age >= 65y) EXAM: CT HEAD WITHOUT CONTRAST CT CERVICAL SPINE WITHOUT CONTRAST TECHNIQUE: Multidetector CT imaging of the head and cervical spine was performed following the standard protocol without intravenous contrast. Multiplanar CT image reconstructions of the cervical spine were also generated. RADIATION DOSE REDUCTION: This exam was performed according to the departmental dose-optimization program which includes automated exposure control, adjustment of the mA and/or kV according to patient size and/or use of iterative reconstruction technique. COMPARISON:  None Available. FINDINGS: CT HEAD FINDINGS Brain: No evidence of acute  infarction, hemorrhage, hydrocephalus, extra-axial collection or mass lesion/mass effect. Vascular: No hyperdense vessel or unexpected calcification. Skull: Normal. Negative for fracture or focal lesion. Sinuses/Orbits: No middle ear effusion. Polypoid mucosal maxillary. Orbits are Other: None CT CERVICAL SPINE FINDINGS Alignment: Normal. Skull base and vertebrae: No acute fracture. No primary bone lesion or focal pathologic process. Status post C4-C5 ACDF solid osseous fusion. Soft tissues and spinal canal: No prevertebral fluid or swelling. No visible canal hematoma. Disc levels:  No evidence of high-grade spinal canal stenosis. 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 Vascular findings suggestive of anemia. Other: None IMPRESSION: 1. No acute intracranial abnormality. 2. No acute fracture or traumatic malalignment of the cervical spine. Electronically Signed   By: Lorenza Cambridge M.D.   On: 03/21/2023 15:13   DG Chest Portable 1 View  Result Date: 03/21/2023 CLINICAL DATA:  Shortness of breath pain after fall EXAM: PORTABLE CHEST 1 VIEW COMPARISON:  01/10/2023 x-ray and older FINDINGS: Poor inflation. Grossly no consolidation, pneumothorax or effusion. No edema. Normal cardiopericardial silhouette. Overall if there is concern further of sequela of trauma additional contrast CT as clinically appropriate. IMPRESSION: Poor inflation.  Grossly no acute cardiopulmonary disease Electronically Signed   By: Karen Kays M.D.   On: 03/21/2023 14:13

## 2023-03-22 NOTE — Procedures (Signed)
Paracentesis Procedure Note  UZOMA THWING  [id number]  10-28-1960  Date:03/22/23  Time:12:17 PM   Provider Performing:Jewelle Whitner Mechele Collin, MD   Procedure: Paracentesis with imaging guidance (69629)  Indication(s) Ascites  Consent Risks of the procedure as well as the alternatives and risks of each were explained to the patient and/or caregiver.  Consent for the procedure was obtained and is signed in the bedside chart  Anesthesia Topical only with 1% lidocaine , 5 cc   Time Out Verified patient identification, verified procedure, site/side was marked, verified correct patient position, special equipment/implants available, medications/allergies/relevant history reviewed, required imaging and test results available.   Sterile Technique Maximal sterile technique including full sterile barrier drape, hand hygiene, sterile gown, sterile gloves, mask, hair covering, sterile ultrasound probe cover (if used).   Procedure Description Ultrasound used to identify appropriate peritoneal anatomy for placement and overlying skin marked.  Area of drainage cleaned and draped in sterile fashion. Lidocaine was used to anesthetize the skin and subcutaneous tissue.  4000 cc's of cloudy yellow appearing fluid was drained. Catheter then removed and bandaid applied to site.   Complications/Tolerance None; patient tolerated the procedure well.   EBL Minimal   Specimen(s) Peritoneal fluid

## 2023-03-22 NOTE — Progress Notes (Signed)
PICC team RN unable to obtain telephone consent for procedure. Family has previously expressed continued full code for now and treat conditions as needed. Patient needs PICC line for ongoing care including pressor support. Will emergently consent as this is consistent with current goals of care.

## 2023-03-22 NOTE — Progress Notes (Signed)
Attempted small bore NG insertions after pt failed at taking oral liquids and medications. PT unable to follow directions to swallow and just keeps repeating that he wants to get up. Can intermittently have meaningful conversation with him but mostly is focused on getting out of the bed. Attempted NG insertion in both nostrils but patient unable to follow directions for swallowing and held his breath the entire time which caused him to have a substantial drop in oxygen saturation with good waveform on monitor. Charge nurse at bedside during this time as well. NG immediately removed and he does have small amount of bleeding from his nose. Will attempt lactulose insertion through rectum if patient is able to be compliant.

## 2023-03-22 NOTE — Progress Notes (Deleted)
Updated sister, Corrie Dandy by phone.  Reports they moved their mother down to [place] 3 months ago and has been sick, pacemaker placed yesterday- likely stressor for pt's ETOH abuse.  Also reports pt has been IVC'd prior for SI and he has voiced "he wants to end it all".    - consider psych clearance after extubation for possible SI attempt - if any decompensation, call family> likely would not want heroic measures but continue full code for now as everything current is reversible       Posey Boyer, MSN, NP, AG-ACNP-BC [facility] 03/22/2023, 1:25 PM  See Amion for pager If no response to pager , please call [phone] until 7pm After 7:00 pm call Elink  [phone]

## 2023-03-22 NOTE — Progress Notes (Signed)
    Patient Name: ALEXIE KOHLBECK           DOB: Jun 28, 1961  MRN: [id number]      Admission Date: 03/21/2023  Attending Provider: Rolly Salter, MD  Primary Diagnosis: Hypothermia   Level of care: Stepdown    CROSS COVER NOTE   Date of Service   03/22/2023   ALIOUNE BLAIZE, 62 y.o. male, was admitted on 03/21/2023 for Hypothermia.    HPI/Events of Note   Hypotensive After blood administration, SBP 80-90s with poor urine output Hemoglobin 7.2 after 2 units PRBC.  Continue monitoring H&H.  No signs of active bleeding reported. Paracentesis on 8/21, total of 10 L fluid were removed.  Current serum albumin  <3.0.  Patient has been aggressively treated with IV albumin. Currently being treated with IV vancomycin and IV Zosyn for potential sepsis.  Hypothermia has resolved, current WBC 7.8.  Will obtain lactic acid. Lactic acid 3.1.  Despite third spacing, patient appears dry (sunken eyes, dry skin, dry oral mucosa).  Will place one-time order for 500 cc fluid bolus.   Interventions/ Plan   Lactic acid --> 3.1--> will repeat  Fluid bolus, 500 cc total Dextrose 10%-for hypoglycemia Midodrine, 5 mg x 1        Anthoney Harada, DNP, [employer]

## 2023-03-22 NOTE — Progress Notes (Signed)
Peripherally Inserted Central Catheter Placement  The IV Nurse has discussed with the patient and/or persons authorized to consent for the patient, the purpose of this procedure and the potential benefits and risks involved with this procedure.  The benefits include less needle sticks, lab draws from the catheter, and the patient may be discharged home with the catheter. Risks include, but not limited to, infection, bleeding, blood clot (thrombus formation), and puncture of an artery; nerve damage and irregular heartbeat and possibility to perform a PICC exchange if needed/ordered by physician.  Alternatives to this procedure were also discussed.  Bard Power PICC patient education guide, fact sheet on infection prevention and patient information card has been provided to patient /or left at bedside.  Medical necessity consent due to inability to reach family via telephone and patient with altered mental status and unable to given own consent.  PICC Placement Documentation  PICC Triple Lumen 03/22/23 Right Brachial 40 cm 1 cm (Active)  Indication for Insertion or Continuance of Line Vasoactive infusions 03/22/23 1444  Exposed Catheter (cm) 1 cm 03/22/23 1444  Site Assessment Clean, Dry, Intact 03/22/23 1444  Lumen #1 Status Flushed;Saline locked;Blood return noted 03/22/23 1444  Lumen #2 Status Flushed;Saline locked;Blood return noted 03/22/23 1444  Lumen #3 Status Flushed;Saline locked;Blood return noted 03/22/23 1444  Dressing Type Transparent;Securing device 03/22/23 1444  Dressing Status Antimicrobial disc in place;Clean, Dry, Intact 03/22/23 1444  Line Care Connections checked and tightened 03/22/23 1444  Line Adjustment (NICU/IV Team Only) No 03/22/23 1444  Dressing Intervention New dressing 03/22/23 1444  Dressing Change Due 03/29/23 03/22/23 1444       Bernisha Verma, Lajean Manes 03/22/2023, 2:45 PM

## 2023-03-22 NOTE — Progress Notes (Signed)
PT Cancellation Note  Patient Details Name: Nathan Roberts MRN: [id number] DOB: 1961/01/22   Cancelled Treatment:    Reason Eval/Treat Not Completed: Medical issues which prohibited therapy,will check back another day.  Blanchard Kelch PT Acute Rehabilitation Services Office [phone] Weekend pager-[phone]    Nathan Roberts 03/22/2023, 1:00 PM

## 2023-03-22 NOTE — Inpatient Diabetes Management (Signed)
Inpatient Diabetes Program Recommendations  AACE/ADA: New Consensus Statement on Inpatient Glycemic Control (2015)  Target Ranges:  Prepandial:   less than 140 mg/dL      Peak postprandial:   less than 180 mg/dL (1-2 hours)      Critically ill patients:  140 - 180 mg/dL    Latest Reference Range & Units 03/22/23 03:06  Hemoglobin A1C 4.8 - 5.6 % 4.6 (L)  (L): Data is abnormally low  Latest Reference Range & Units 03/22/23 00:34 03/22/23 00:59 03/22/23 03:32 03/22/23 07:35 03/22/23 08:14  Glucose-Capillary 70 - 99 mg/dL 62 (L) 95 81 71 161 (H)  (L): Data is abnormally low (H): Data is abnormally high   Hypothermia/ Decompensated hepatic cirrhosis/ Ascites due to alcoholic cirrhosis/ Fall at home  Hx of Pre-Diabetes (see PCP notes)  Home DM Meds: Metformin 500 mg QAM     MD- Note pt has Rx for Metformin 500 mg Daily at home for pre-diabetes  Given issues with Hepatic Cirrhosis, recommend Stopping Metformin for home use at time of discharge     --Will follow patient during hospitalization--  Ambrose Finland RN, MSN, CDCES Diabetes Coordinator Inpatient Glycemic Control Team Team Pager: [phone] (8a-5p)

## 2023-03-22 NOTE — Consult Note (Signed)
[facility] Consultation Note  Referring Provider: [facility] Primary Care Physician:  Clayborne Dana, NP Primary Gastroenterologist: [facility]  Reason for Consultation:  ascites, anemia  HPI: Nathan Roberts is a 62 y.o. male presenting feeling ill, weak, (pre)syncopal after large volume paracentesis.  Unable to obtain history from patient.  No reported hematemesis or melena or hematochezia.   Past Medical History:  Diagnosis Date   Allergy    Arthritis    Carpal tunnel syndrome    Diabetes mellitus without complication (HCC)    Esophageal varices (HCC)    GERD (gastroesophageal reflux disease)    GI bleed    Hyperlipidemia    Hypertension    Pancreas divisum 03/22/2023   Portal hypertensive gastropathy (HCC)    Pre-diabetes     Past Surgical History:  Procedure Laterality Date   abscess     rectal area   CARPAL TUNNEL RELEASE     left   CARPAL TUNNEL RELEASE Right 07/07/2021   Procedure: RIGHT CARPAL TUNNEL RELEASE;  Surgeon: Cindee Salt, MD;  Location: [facility];  Service: Orthopedics;  Laterality: Right;   CERVICAL FUSION     COLONOSCOPY WITH PROPOFOL N/A 05/29/2022   Procedure: COLONOSCOPY WITH PROPOFOL;  Surgeon: Napoleon Form, MD;  Location: [facility];  Service: Gastroenterology;  Laterality: N/A;   ESOPHAGEAL BANDING  10/07/2020   Procedure: ESOPHAGEAL BANDING;  Surgeon: Napoleon Form, MD;  Location: [facility];  Service: Endoscopy;;   ESOPHAGOGASTRODUODENOSCOPY (EGD) WITH PROPOFOL N/A 10/07/2020   Procedure: ESOPHAGOGASTRODUODENOSCOPY (EGD) WITH PROPOFOL;  Surgeon: Napoleon Form, MD;  Location: [facility];  Service: Endoscopy;  Laterality: N/A;   ESOPHAGOGASTRODUODENOSCOPY (EGD) WITH PROPOFOL N/A 01/18/2021   Procedure: ESOPHAGOGASTRODUODENOSCOPY (EGD) WITH PROPOFOL;  Surgeon: Napoleon Form, MD;  Location: [facility];  Service: Endoscopy;  Laterality: N/A;   ESOPHAGOGASTRODUODENOSCOPY (EGD) WITH  PROPOFOL N/A 05/29/2022   Procedure: ESOPHAGOGASTRODUODENOSCOPY (EGD) WITH PROPOFOL;  Surgeon: Napoleon Form, MD;  Location: [facility];  Service: Gastroenterology;  Laterality: N/A;   FASCIECTOMY Left 02/10/2021   Procedure: FASCIECTOMY RING FINGER AND SMALL FINGER OF LEFT HAND;  Surgeon: Cindee Salt, MD;  Location: [facility];  Service: Orthopedics;  Laterality: Left;   FASCIECTOMY Right 07/07/2021   Procedure: FASCIECTOMY RIGHT 1ST WEB, RIGHT RING FINGER, RIGHT SMALL FINGER;  Surgeon: Cindee Salt, MD;  Location: [facility];  Service: Orthopedics;  Laterality: Right;   SPINE SURGERY      Prior to Admission medications   Medication Sig Start Date End Date Taking? Authorizing Provider  furosemide (LASIX) 40 MG tablet Take 1 tablet (40 mg total) by mouth 2 (two) times daily at 10 am and 4 pm. 02/20/23 03/22/23  Clayborne Dana, NP  metFORMIN (GLUCOPHAGE) 500 MG tablet Take 500 mg by mouth daily.    [provider]  Multiple Vitamins-Minerals (MULTIVITAMIN WITH MINERALS) tablet Take 1 tablet by mouth daily.    [provider]  pantoprazole (PROTONIX) 40 MG tablet Take 1 tablet (40 mg total) by mouth 2 (two) times daily before a meal. 02/20/23   Clayborne Dana, NP  propranolol (INDERAL) 10 MG tablet Take 1 tablet (10 mg total) by mouth 2 (two) times daily. 01/13/23 04/13/23  Lorin Glass, MD  spironolactone (ALDACTONE) 25 MG tablet Take 1 tablet (25 mg total) by mouth daily. 02/20/23 03/22/23  Clayborne Dana, NP    Current Facility-Administered Medications  Medication Dose Route Frequency Provider Last Rate Last Admin  0.9 %  sodium chloride infusion (Manually program via Guardrails IV Fluids)   Intravenous Once Rolly Salter, MD       0.9 %  sodium chloride infusion  250 mL Intravenous Continuous Selmer Dominion B, NP       albumin human 25 % solution 100 g  100 g Intravenous Once Selmer Dominion B, NP       albuterol (PROVENTIL) (2.5 MG/3ML)  0.083% nebulizer solution 5 mg  5 mg Nebulization Once Selmer Dominion B, NP       calcium gluconate 1 g/ 50 mL sodium chloride IVPB  1 g Intravenous Once Selmer Dominion B, NP       Chlorhexidine Gluconate Cloth 2 % PADS 6 each  6 each Topical Daily Rolly Salter, MD       dextrose 10 % infusion   Intravenous Continuous Rolly Salter, MD       folic acid injection 1 mg  1 mg Intravenous Once Rolly Salter, MD       lactulose [facility]) enema 200 gm  300 mL Rectal Once Rolly Salter, MD       lactulose [facility]) enema 200 gm  300 mL Rectal BID Selmer Dominion B, NP       midodrine (PROAMATINE) tablet 2.5 mg  2.5 mg Oral TID WC Rolly Salter, MD       norepinephrine (LEVOPHED) 4mg  in (0.016 mg/mL) premix infusion  2-10 mcg/min Intravenous Titrated Selmer Dominion B, NP       ondansetron (ZOFRAN) tablet 4 mg  4 mg Oral Q6H PRN Rolly Salter, MD       Or   ondansetron [facility]) injection 4 mg  4 mg Intravenous Q6H PRN Rolly Salter, MD       [START ON 03/25/2023] pantoprazole (PROTONIX) injection 40 mg  40 mg Intravenous Q12H Rolly Salter, MD       pantoprozole (PROTONIX) 80 mg /NS 100 mL infusion  8 mg/hr Intravenous Continuous Rolly Salter, MD 10 mL/hr at 03/22/23 0621 8 mg/hr at 03/22/23 0454   phytonadione (VITAMIN K) 5 mg in dextrose 5 % 50 mL IVPB  5 mg Intravenous Once Rolly Salter, MD   Held at 03/22/23 0902   piperacillin-tazobactam (ZOSYN) IVPB 3.375 g  3.375 g Intravenous Q8H Pricilla Riffle, RPH   Stopped at 03/22/23 1023   sodium bicarbonate injection 100 mEq  100 mEq Intravenous Once Selmer Dominion B, NP       sodium chloride flush (NS) 0.9 % injection 3 mL  3 mL Intravenous Q12H Rolly Salter, MD   3 mL at 03/21/23 2233   sodium polystyrene (KAYEXALATE) 15 GM/60ML suspension 30 g  30 g Rectal Once Selmer Dominion B, NP       sodium zirconium cyclosilicate (LOKELMA) packet 10 g  10 g Oral Daily Rolly Salter, MD        Allergies as of 03/21/2023 -  Review Complete 03/21/2023  Allergen Reaction Noted   Cephalexin Rash 08/12/2017   Doxycycline Rash 10/06/2020   Latex Hives and Rash 11/01/2011    Family History  Problem Relation Age of Onset   Early death Mother    Lung cancer Mother 9       lung--smoker   Alcohol abuse Mother    Atrial fibrillation Father    Heart disease Father    COPD Father    Hyperlipidemia Father    Hypertension Father  Arthritis Paternal Grandmother    Cancer Paternal Grandfather    Colon cancer Neg Hx    Esophageal cancer Neg Hx    Stomach cancer Neg Hx    Rectal cancer Neg Hx     Social History   Socioeconomic History   Marital status: Divorced    Spouse name: Not on file   Number of children: Not on file   Years of education: Not on file   Highest education level: Not on file  Occupational History   Not on file  Tobacco Use   Smoking status: Former    Current packs/day: 0.00    Types: Cigarettes    Quit date: 12/08/1996    Years since quitting: 26.3   Smokeless tobacco: Never  Vaping Use   Vaping status: Never Used  Substance and Sexual Activity   Alcohol use: Yes    Alcohol/week: 9.0 standard drinks of alcohol    Types: 2 Glasses of wine, 5 Cans of beer, 1 Shots of liquor, 1 Standard drinks or equivalent per week   Drug use: No   Sexual activity: Not Currently  Other Topics Concern   Not on file  Social History Narrative   Not on file   Social Determinants of Health   Financial Resource Strain: Not on file  Food Insecurity: Food Insecurity Present (03/21/2023)   Hunger Vital Sign    Worried About Running Out of Food in the Last Year: Sometimes true    Ran Out of Food in the Last Year: Sometimes true  Transportation Needs: Unmet Transportation Needs (03/21/2023)   PRAPARE - Administrator, Civil Service (Medical): Yes    Lack of Transportation (Non-Medical): Yes  Physical Activity: Not on file  Stress: Not on file  Social Connections: Unknown (12/09/2021)    Received from [facility]   Social Network    Social Network: Not on file  Intimate Partner Violence: Not At Risk (03/21/2023)   Humiliation, Afraid, Rape, and Kick questionnaire    Fear of Current or Ex-Partner: No    Emotionally Abused: No    Physically Abused: No    Sexually Abused: No    Review of Systems: Unable to obtain due to patient's altered mental status  Physical Exam: Vital signs in last 24 hours: Temp:  [93.1 F (33.9 C)-98.3 F (36.8 C)] 97.1 F (36.2 C) (08/22 0800) Pulse Rate:  [80-109] 92 (08/22 0945) Resp:  [13-25] 14 (08/22 1034) BP: (83-176)/(31-143) 91/38 (08/22 1034) SpO2:  [19 %-100 %] 19 % (08/22 0945) Weight:  [95.3 kg] 95.3 kg (08/21 1244)   General:   Somnolent, difficult to arouse, older-appearing than stated age, ill-appearing Head:  Normocephalic and atraumatic. Eyes:  Sclera clear, no icterus.   Conjunctiva pale Ears:  Normal auditory acuity. Nose:  No deformity, discharge,  or lesions. Mouth:  No deformity or lesions.  Oropharynx pale and dry Neck:  Supple; no masses or thyromegaly. Lungs:  No respiratory distress Abdomen:  Massive moderately tense ascites  Msk:  Symmetrical without gross deformities. Normal posture. Pulses:  Normal pulses noted. Extremities:  Without clubbing or edema. Neurologic:  Somnolent, difficult to arouse, doesn't follow commands Skin:  Jaundiced, scattered ecchymoses, otherwise Intact without significant lesions or rashes. Psych:  Somnolent, not following commands   Lab Results: Recent Labs    03/21/23 1818 03/22/23 0306 03/22/23 0855  WBC 9.5 7.8 6.4  HGB 6.6* 7.2* 5.5*  HCT 20.3* 21.9* 17.0*  PLT 106* 77* 52*  BMET Recent Labs    03/21/23 1314 03/21/23 1818 03/22/23 0306  NA 125* 124* 127*  K 5.6* 6.3* 5.6*  CL 95* 95* 97*  CO2 14* 15* 21*  GLUCOSE 93 87 95  BUN 50* 57* 60*  CREATININE 1.29* 1.37* 1.25*  CALCIUM 9.0 8.9 8.9   LFT Recent Labs    03/22/23 0306  PROT 5.4*   ALBUMIN 3.0*  AST 71*  ALT 28  ALKPHOS 60  BILITOT 4.2*   PT/INR Recent Labs    03/21/23 1314 03/22/23 0306  LABPROT 25.4* 26.9*  INR 2.3* 2.5*    Studies/Results: [place] EKG SITE RITE  Result Date: 03/22/2023 If [facility] image not attached, placement could not be confirmed due to current cardiac rhythm.  CT ABDOMEN PELVIS WO CONTRAST  Result Date: 03/22/2023 CLINICAL DATA:  Retroperitoneal bleed suspected. Fall, abdominal distention. EXAM: CT ABDOMEN AND PELVIS WITHOUT CONTRAST TECHNIQUE: Multidetector CT imaging of the abdomen and pelvis was performed following the standard protocol without IV contrast. RADIATION DOSE REDUCTION: This exam was performed according to the departmental dose-optimization program which includes automated exposure control, adjustment of the mA and/or kV according to patient size and/or use of iterative reconstruction technique. COMPARISON:  01/10/2023 FINDINGS: Lower chest: Bibasilar atelectasis. Coronary artery and aortic atherosclerosis. Hepatobiliary: Nodular, shrunken liver compatible with cirrhosis. Gallbladder contracted, grossly unremarkable. Pancreas: No focal abnormality or ductal dilatation. Spleen: No focal abnormality.  Normal size. Adrenals/Urinary Tract: No adrenal abnormality. No focal renal abnormality. No stones or hydronephrosis. Urinary bladder is unremarkable. Stomach/Bowel: Diffuse colonic diverticulosis. No active diverticulitis. Stomach and small bowel decompressed. No bowel obstruction. Vascular/Lymphatic: Aortic atherosclerosis. No evidence of aneurysm or adenopathy. Reproductive: No visible focal abnormality. Other: Large volume ascites. Moderate-sized umbilical hernia containing ascites, stable. Musculoskeletal: No acute bony abnormality. IMPRESSION: Cirrhosis with associated large volume ascites. Colonic diverticulosis. Umbilical hernia containing fluid. Aortic atherosclerosis. Electronically Signed   By: Charlett Nose M.D.   On: 03/22/2023  00:47   US Paracentesis  Result Date: 03/21/2023 INDICATION: Patient with history of alcoholic cirrhosis, portal hypertension, refractory ascites; request received for therapeutic paracentesis EXAM: ULTRASOUND GUIDED THERAPEUTIC PARACENTESIS MEDICATIONS: 8 mL 1% lidocaine COMPLICATIONS: None immediate. PROCEDURE: Informed written consent was obtained from the patient after a discussion of the risks, benefits and alternatives to treatment. A timeout was performed prior to the initiation of the procedure. Initial ultrasound scanning demonstrates a large amount of ascites within the right lower abdominal quadrant. The right lower abdomen was prepped and draped in the usual sterile fashion. 1% lidocaine was used for local anesthesia. Following this, a 19 gauge, 7-cm, Yueh catheter was introduced. An ultrasound image was saved for documentation purposes. The paracentesis was performed. The catheter was removed and a dressing was applied. The patient tolerated the procedure well without immediate post procedural complication. Patient will tentatively receive post-procedure intravenous albumin in ED; see nursing notes for details. FINDINGS: A total of approximately 10 liters of slightly hazy, yellow fluid was removed. IMPRESSION: Successful ultrasound-guided therapeutic paracentesis yielding 10 liters of peritoneal fluid. PLAN: The patient has required >/=2 paracenteses in a 30 day period and a formal evaluation by the [facility] has been arranged. Performed by: Artemio Aly Electronically Signed   By: Simonne Come M.D.   On: 03/21/2023 16:21   CT HEAD WO CONTRAST ( )  Result Date: 03/21/2023 CLINICAL DATA:  Facial trauma, blunt; Neck trauma (Age >= 65y) EXAM: CT HEAD WITHOUT CONTRAST CT CERVICAL SPINE WITHOUT CONTRAST TECHNIQUE: Multidetector CT  imaging of the head and cervical spine was performed following the standard protocol without intravenous  contrast. Multiplanar CT image reconstructions of the cervical spine were also generated. RADIATION DOSE REDUCTION: This exam was performed according to the departmental dose-optimization program which includes automated exposure control, adjustment of the mA and/or kV according to patient size and/or use of iterative reconstruction technique. COMPARISON:  None Available. FINDINGS: CT HEAD FINDINGS Brain: No evidence of acute infarction, hemorrhage, hydrocephalus, extra-axial collection or mass lesion/mass effect. Vascular: No hyperdense vessel or unexpected calcification. Skull: Normal. Negative for fracture or focal lesion. Sinuses/Orbits: No middle ear effusion. Polypoid mucosal maxillary. Orbits are Other: None CT CERVICAL SPINE FINDINGS Alignment: Normal. Skull base and vertebrae: No acute fracture. No primary bone lesion or focal pathologic process. Status post C4-C5 ACDF solid osseous fusion. Soft tissues and spinal canal: No prevertebral fluid or swelling. No visible canal hematoma. Disc levels:  No evidence of high-grade spinal canal stenosis. 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 Vascular findings suggestive of anemia. Other: None IMPRESSION: 1. No acute intracranial abnormality. 2. No acute fracture or traumatic malalignment of the cervical spine. Electronically Signed   By: Lorenza Cambridge M.D.   On: 03/21/2023 15:13   CT Cervical Spine Wo Contrast  Result Date: 03/21/2023 CLINICAL DATA:  Facial trauma, blunt; Neck trauma (Age >= 65y) EXAM: CT HEAD WITHOUT CONTRAST CT CERVICAL SPINE WITHOUT CONTRAST TECHNIQUE: Multidetector CT imaging of the head and cervical spine was performed following the standard protocol without intravenous contrast. Multiplanar CT image reconstructions of the cervical spine were also generated. RADIATION DOSE REDUCTION: This exam was performed according to the departmental dose-optimization program which includes automated exposure control, adjustment of the mA  and/or kV according to patient size and/or use of iterative reconstruction technique. COMPARISON:  None Available. FINDINGS: CT HEAD FINDINGS Brain: No evidence of acute infarction, hemorrhage, hydrocephalus, extra-axial collection or mass lesion/mass effect. Vascular: No hyperdense vessel or unexpected calcification. Skull: Normal. Negative for fracture or focal lesion. Sinuses/Orbits: No middle ear effusion. Polypoid mucosal maxillary. Orbits are Other: None CT CERVICAL SPINE FINDINGS Alignment: Normal. Skull base and vertebrae: No acute fracture. No primary bone lesion or focal pathologic process. Status post C4-C5 ACDF solid osseous fusion. Soft tissues and spinal canal: No prevertebral fluid or swelling. No visible canal hematoma. Disc levels:  No evidence of high-grade spinal canal stenosis. 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 Vascular findings suggestive of anemia. Other: None IMPRESSION: 1. No acute intracranial abnormality. 2. No acute fracture or traumatic malalignment of the cervical spine. Electronically Signed   By: Lorenza Cambridge M.D.   On: 03/21/2023 15:13   DG Chest Portable 1 View  Result Date: 03/21/2023 CLINICAL DATA:  Shortness of breath pain after fall EXAM: PORTABLE CHEST 1 VIEW COMPARISON:  01/10/2023 x-ray and older FINDINGS: Poor inflation. Grossly no consolidation, pneumothorax or effusion. No edema. Normal cardiopericardial silhouette. Overall if there is concern further of sequela of trauma additional contrast CT as clinically appropriate. IMPRESSION: Poor inflation.  Grossly no acute cardiopulmonary disease Electronically Signed   By: Karen Kays M.D.   On: 03/21/2023 14:13    Impression:   Alcohol-mediated cirrhosis, decompensated. Ascites.  Coagulopathy. Low blood pressure after paracentesis; hemoperitoneum versus paracentesis-induced circulatory dysfunction (PICD) seem most likely diagnoses. Acute progressive anemia.  No overt GI bleeding.  Symptoms all  started after paracentesis.  I do not think this is representative of acute GI bleeding. Vacillating consciousness, hepatic encephalopathy?  Plan:   Repeat paracentesis today (  diagnosis), specifically looking for blood. Patient massive reaccumulation of ascites very soon after last paracentesis; this can be seen also in PICD; could be blood (thus paracentesis again diagnostic to look for blood). Albumin IV.  Gentle blood transfusions as needed keep Hgb ~ 7. Antibiotics. Not in any state for diuretics. He has no signs of overt bleeding and thus I don't think endoscopy is indicated and would likely actually be harmful in his current tenuous clinical state. Prognosis guarded. [facility] will follow.   LOS: 1 day   Torrion Witter M  03/22/2023, 11:28 AM  Cell [phone] If no answer or after 5 PM call [phone]

## 2023-03-22 NOTE — Consult Note (Signed)
NAME:  Nathan Roberts, MRN:  [id number], DOB:  [birth year], LOS: 1 ADMISSION DATE:  03/21/2023, CONSULTATION DATE:  8/22 REFERRING MD:  Dr. Allena Katz, CHIEF COMPLAINT:  fall/ weakness   History of Present Illness:  34 yoM with PMH of prior ETOH abuse, decompensated alcoholic cirrhosis with refractory ascites requiring frequent large volume paracentesis, hepatic encephalopathy, DM, GERD, umbilical hernia, and former smoker who presented after a fall at home and unable to get up.  No reported ETOH use in 3 months.  Recently established with [facility] 8/2 to get cleared for hernia repair.  Given hypotension and AKI, diuretics and propranolol held and placed on prophylaxis cipro with recent ascites leak.  Workup started for TTE and TIPS eval.  Last paracentesis 8/5 with 12.8L removed.  No reported fever, chills, or bleeding on admit.   Initially hypothermic, SBP 90-138 and normoxia. CTH neg. WBC 6.4, Hgb 7.7, multiple metabolic derangements, AKI, ammonia 62, AKI.  Pancultured and started on empiric zosyn and vancomycin.   Admitted to [facility].  Underwent IR paracentesis with 10L of slightly hazy, yellow fluid removed, not sent for labs.  Post-procedural hypotension treated with albumin.  CT abd/ pelvis wo contrast with no acute concerns.  Ongoing hypotension today, started on midodrine.  Small bore feeding tube attempted but unsuccessful. Worsening abdominal distention despite large volume paracentesis with concern for hemoperitoneum.  2u PRBC ordered. IR diagnostic paracentesis ordered.  [facility] consulted.  PCCM consulted given overall decompensation.   Pertinent  Medical History  ETOH abuse, alcoholic cirrhosis, esophageal varices, DM, GERD, umbilical hernia, former smoker  Significant Hospital Events: Including procedures, antibiotic start and stop dates in addition to other pertinent events   8/21 admitted   Interim History / Subjective:  Pt remains encephalopathic but denies any current pain or  SOB.  Objective   Blood pressure (!) 101/45, pulse 92, temperature (!) 97.1 F (36.2 C), temperature source Axillary, resp. rate (!) 23, height 5\' 8"  (1.727 m), weight 95.3 kg, SpO2 (S) (!) 19%.        Intake/Output Summary (Last 24 hours) at 03/22/2023 1022 Last data filed at 03/22/2023 0600 Gross per 24 hour  Intake 1286.53 ml  Output 275 ml  Net 1011.53 ml   Filed Weights   03/21/23 1244  Weight: 95.3 kg   Examination: General:  AoC ill appearing older male lying in bed HEENT: MM pink/moist, dried cracked/ bloody lips, pupils 3/r, mild icterus Neuro: slowed responses, oriented to person, place, month, follows commands, MAE-generalized weakness CV: rr, NSR, no murmur, +2 distal pulses PULM:  non labored, clear, on room air GI: distended and taut but not obviously tender, +bs, soft umbilical hernia, purwick Extremities: warm/dry, LE +2-3, LE venous stasis changes  Skin: mild jaundice   Labs and imaging reviewed  Resolved Hospital Problem list    Assessment & Plan:   Hypotension, acute on chronic - previously taken off diuretics and propranolol 8/1 due to hypotension P - ddx include ABLA, volume shifts, sepsis - r/o hemoperitoneum s/p paracentesis 8/21, will proceed with bedside diagnostic paracentesis.  Will send peritoneal fluid for culture and fluid studies, r/o SBP - 2u PRBC ordered> trend H/H and coags - peripheral NE for MAP goal > 65 - PICC ordered - echo - empiric vanc and zosyn for now - follow cultures, sent UA - lactic trend will likely be slow to clear with cirrhosis  - trend WBC/ fever curve - ongoing supportive care, normothermia   Decompensated ETOH cirrhosis Refractory ascites  with portal hypertensive gastropathy  ETOH abuse - reported cessation 3 months Esophageal varices, w/ prior banding 09/2020 Hyperbilirubinemia  - MELD 23 (previously 20).  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  Will give albuterol, calcium gluconate, and bicarb 2 amps for now.  Given hypotension, defer lasix.  Hypoglycemic on D10 gtt> defer insulin.  - kayexalate enema - recheck BMET at 1700 - UA - monitor for urinary retention - trend renal indices  - strict I/Os, daily wts - avoid nephrotoxins, renal dose meds, hemodynamic support as above   DM with refractory hypoglycemia - hold metformin - D10 gtt > titrate to avoid hypoglycemia.  Remain NPO due to HE   GERD - PPI    Umbilical hernia - reported prior leak, under eval at [facility] for repair but awaiting clearance from [facility] and control of ascites - no obvious leakage, noted to resolved after paracentesis    Protein calorie malnutrition Poor functional status at baseline Fall at  home - unable to place small bore feeding tube - remains NPO - CTH neg on initial eval, no focal concerns - PMT consulted for GOC  Best Practice (right click and "Reselect all SmartList Selections" daily)   Diet/type: NPO DVT prophylaxis: SCD GI prophylaxis: PPI Lines: N/A Foley:  N/A Code Status:  full code Last date of multidisciplinary goals of care discussion [8/22]  Pending PMT consult for ongoing GOC.  Pt has no HCPOA.  Ex-wife listed as  NOK.  Labs   CBC: Recent Labs  Lab 03/21/23 1314 03/21/23 1818 03/22/23 0306 03/22/23 0855  WBC 10.4 9.5 7.8 6.4  NEUTROABS 7.6 7.4  --  4.5  HGB 7.7* 6.6* 7.2* 5.5*  HCT 24.8* 20.3* 21.9* 17.0*  MCV 106.9* 104.1* 97.8 100.6*  PLT 115* 106* 77* 52*    Basic Metabolic Panel: Recent Labs  Lab 03/21/23 1314 03/21/23 1818 03/22/23 0306  NA 125* 124* 127*  K 5.6* 6.3* 5.6*  CL 95* 95* 97*  CO2 14* 15* 21*  GLUCOSE 93 87 95  BUN 50* 57* 60*  CREATININE 1.29* 1.37* 1.25*  CALCIUM 9.0 8.9 8.9   GFR: Estimated Creatinine Clearance: 66.9 mL/min (A) (by C-G formula based on SCr of 1.25 mg/dL (H)). Recent Labs  Lab 03/21/23 1314 03/21/23 1818 03/22/23 0306 03/22/23 0735 03/22/23 0855  WBC 10.4 9.5 7.8  --  6.4  LATICACIDVEN  --   --  3.1* 2.9*  --     Liver Function Tests: Recent Labs  Lab 03/21/23 1314 03/21/23 1818 03/22/23 0306  AST 69* 77* 71*  ALT 29 32 28  ALKPHOS 84 77 60  BILITOT 3.6* 3.8* 4.2*  PROT 5.8* 5.8*  5.4*  ALBUMIN 2.0* 2.5* 3.0*   No results for input(s): "LIPASE", "AMYLASE" in the last 168 hours. Recent Labs  Lab 03/21/23 1313  AMMONIA 62*    ABG No results found for: "PHART", "PCO2ART", "PO2ART", "HCO3", "TCO2", "ACIDBASEDEF", "O2SAT"   Coagulation Profile: Recent Labs  Lab 03/21/23 1314 03/22/23 0306  INR 2.3* 2.5*    Cardiac Enzymes: No results for input(s): "CKTOTAL", "CKMB", "CKMBINDEX", "TROPONINI" in the last 168 hours.  HbA1C: Hgb A1c MFr Bld  Date/Time Value Ref Range Status  03/22/2023 03:06 AM 4.6 (L) 4.8 - 5.6 % Final    Comment:    (NOTE) Pre diabetes:          5.7%-6.4%  Diabetes:              >6.4%  Glycemic control for   <7.0% adults with diabetes   12/22/2022 11:01 AM 4.8 4.6 - 6.5 % Final    Comment:    Glycemic Control Guidelines for People with Diabetes:Non Diabetic:  <6%Goal of Therapy: <7%Additional Action Suggested:  >8%     CBG: Recent Labs  Lab 03/22/23 0034 03/22/23 0059  03/22/23 0332 03/22/23 0735 03/22/23 0814  GLUCAP 62* 95 81 71 102*    Review of Systems:   Unable   Past Medical History:  He,  has a past medical history of Allergy, Arthritis, Carpal tunnel syndrome, Diabetes mellitus without complication (HCC), Esophageal varices (HCC), GERD (gastroesophageal reflux disease), GI bleed, Hyperlipidemia, Hypertension, Pancreas divisum (03/22/2023), Portal hypertensive gastropathy (HCC), and Pre-diabetes.   Surgical History:   Past Surgical History:  Procedure Laterality Date   abscess     rectal area   CARPAL TUNNEL RELEASE     left   CARPAL TUNNEL RELEASE Right 07/07/2021   Procedure: RIGHT CARPAL TUNNEL RELEASE;  Surgeon: Cindee Salt, MD;  Location: [facility];  Service: Orthopedics;  Laterality: Right;   CERVICAL FUSION     COLONOSCOPY WITH PROPOFOL N/A 05/29/2022   Procedure: COLONOSCOPY WITH PROPOFOL;  Surgeon: Napoleon Form, MD;  Location: [facility];  Service: Gastroenterology;  Laterality: N/A;   ESOPHAGEAL BANDING  10/07/2020   Procedure: ESOPHAGEAL BANDING;  Surgeon: Napoleon Form, MD;  Location: [facility];  Service: Endoscopy;;   ESOPHAGOGASTRODUODENOSCOPY (EGD) WITH PROPOFOL N/A 10/07/2020   Procedure: ESOPHAGOGASTRODUODENOSCOPY (EGD) WITH PROPOFOL;  Surgeon: Napoleon Form, MD;  Location: [facility];  Service: Endoscopy;  Laterality: N/A;   ESOPHAGOGASTRODUODENOSCOPY (EGD) WITH PROPOFOL N/A 01/18/2021   Procedure: ESOPHAGOGASTRODUODENOSCOPY (EGD) WITH PROPOFOL;  Surgeon: Napoleon Form, MD;  Location: [facility];  Service: Endoscopy;  Laterality: N/A;   ESOPHAGOGASTRODUODENOSCOPY (EGD) WITH PROPOFOL N/A 05/29/2022   Procedure: ESOPHAGOGASTRODUODENOSCOPY (EGD) WITH PROPOFOL;  Surgeon: Napoleon Form, MD;  Location: [facility];  Service: Gastroenterology;  Laterality: N/A;   FASCIECTOMY Left 02/10/2021   Procedure: FASCIECTOMY RING FINGER AND SMALL FINGER OF LEFT HAND;  Surgeon: Cindee Salt, MD;  Location: [facility];  Service: Orthopedics;  Laterality: Left;   FASCIECTOMY Right 07/07/2021   Procedure: FASCIECTOMY RIGHT 1ST WEB, RIGHT RING FINGER, RIGHT SMALL FINGER;  Surgeon: Cindee Salt, MD;  Location: [facility];  Service: Orthopedics;  Laterality: Right;   SPINE SURGERY       Social History:   reports that he quit smoking about 26 years ago. His smoking use included cigarettes. He has never used smokeless tobacco. He reports current alcohol use of about 9.0 standard drinks of alcohol per week. He reports that he does not  use drugs.   Family History:  His family history includes Alcohol abuse in his mother; Arthritis in his paternal grandmother; Atrial fibrillation in his father; COPD in his father; Cancer in his paternal grandfather; Early death in his mother; Heart disease in his father; Hyperlipidemia in his father; Hypertension in his father; Lung cancer (age of onset: 98) in his mother. There is no history of Colon cancer, Esophageal cancer, Stomach cancer, or Rectal cancer.   Allergies Allergies  Allergen Reactions   Cephalexin Rash   Doxycycline Rash   Latex Hives and Rash     Home Medications  Prior to Admission medications   Medication Sig Start Date End Date Taking? Authorizing Provider  furosemide (LASIX) 40 MG tablet Take 1 tablet (40 mg total) by mouth 2 (two) times daily at 10 am and 4 pm. 02/20/23 03/22/23  Clayborne Dana, NP  metFORMIN (GLUCOPHAGE) 500 MG tablet Take 500 mg by mouth daily.    [provider]  Multiple Vitamins-Minerals (MULTIVITAMIN WITH MINERALS) tablet Take 1 tablet by mouth daily.    [provider]  pantoprazole (PROTONIX) 40 MG tablet Take 1 tablet (40 mg total) by mouth 2 (two) times daily before a meal. 02/20/23   Clayborne Dana, NP  propranolol (INDERAL) 10 MG tablet Take 1 tablet (10 mg total) by mouth 2 (two) times daily. 01/13/23 04/13/23  Lorin Glass, MD  spironolactone  (ALDACTONE) 25 MG tablet Take 1 tablet (25 mg total) by mouth daily. 02/20/23 03/22/23  Clayborne Dana, NP     Critical care time: 55 mins     Posey Boyer, MSN, NP, AG-ACNP-BC [facility] 03/22/2023, 10:22 AM  See Amion for pager If no response to pager , please call [phone] until 7pm After 7:00 pm call Elink  [phone]

## 2023-03-22 NOTE — Progress Notes (Signed)
[facility] Progress Note Patient: Nathan Roberts:[id number] DOB: [birth year] DOA: 03/21/2023  DOS: the patient was seen and examined on 03/22/2023  Brief hospital course: PMH significant of decompensated cirrhosis, anxiety, chronic anemia-iron deficiency, esophageal varices, history of alcohol abuse, refractory ascites.  Present to the hospital with complaints of a possible fall and abdominal distention due to missing paracentesis. Currently being treated for sepsis from SBP, possible bleeding, AKI and encephalopathy. Care transferred to ICU on 8/22.  Assessment and Plan: Hypothermia Etiology not clear. Given his presentation with a fall, hypotension as well as multiple electrolyte abnormality and organ damage with AKI will treat as potential sepsis. MRSA PCR negative. Will continue IV Zosyn.   Decompensated hepatic cirrhosis (HCC) Ascites due to alcoholic cirrhosis (HCC) History of Alcohol use Esophageal varices (HCC) Refractory ascites. CP class C. Underwent paracentesis.  Last paracentesis was in August 16 with 12.5 L removed.  Sees Dr. Carrie Mew at [facility]. EGD 10/23 grade 1 and small esophageal varices. TIPS was scheduled outpatient. Was on Cipro daily. Supposed to be on lactulose 30 mL 3 times daily. AFP 6.2 August 24.  Current paracentesis 10 L removed. In the setting of AKI at risk of further worsening with hypotension. Treated aggressively with IV albumin. GI consulted.   Fall at home No focal deficit. CT head C-spine negative for any acute fracture. Monitor for now.   Acute on chronic iron deficiency anemia due to chronic blood loss Hemoglobin 11 in August. Hemoglobin initially 7.7.  Dropped down to 6.5.  CT abdomen negative for retroperitoneal hemorrhage.  After 1 PRBC hemoglobin dropped again to stay 5.5. Receiving 2 PRBC. Hematology consulted for concern for hemolysis but suspect most likely presentation with GI bleed. Will require diagnostic  paracentesis.  So far no bleeding seen here in the hospital.  Will check Hemoccult. Protonix drip. Transfuse for hemoglobin less than 7 or hemodynamic instability. Patient provided consent for blood transfusion.   Hyponatremia. Hyperkalemia Acute kidney injury. Prior sodium 131.  Currently sodium improving. Potassium level still elevated but improving as well. Serum creatinine remains elevated baseline 0.7. AKI most likely in the setting of ascites causing increased intra-abdominal pressure.  But at risk for hepatorenal syndrome as well. Will aggressively support with IV albumin. Elevated potassium most likely in the setting of ongoing use of Aldactone. Initiate Lokelma therapy.   Hyponatremia mostly in the setting of poor p.o. intake as well as cirrhosis.  Hypoglycemia. In the setting of poor p.o. intake. Currently on D10. Might worsen hyponatremia.   Possible hepatic encephalopathy. Ammonia level 62. Asterixis possible. Will initiate lactulose therapy.  Since unable to tolerate p.o. ordered lactulose enema.   Chronic thrombocytopenia. Worsening, likely dilutional in nature. Hematology following. In the setting of alcohol cirrhosis. Monitor for now.   Hypertension Blood pressure soft. Currently holding medication. On midodrine. May require pressors. Will initiate PICC line therapy.  Hyperlipidemia Holding statin for now.   Umbilical hernia without obstruction and without gangrene Monitor for now.  Currently no evidence of obstruction.  Given patient's multisystem failure as well as high risk for further deterioration and requiring aggressive intervention and desire to remain full code care currently transferred to ICU service. Highly appreciate critical care's assistance. Prognosis still guarded given his decompensated cirrhosis and poor transplant candidacy.  Palliative care consulted.  Subjective: Had some shortness of breath.  No nausea no vomiting.  More  lethargic today compared to yesterday.  No abdominal pain but ongoing distention.  Hypothermia improved overnight.  Physical Exam:  General: in moderate distress, No Rash, bleeding from the lips, brownish drainage from his mouth. Cardiovascular: S1 and S2 Present, No Murmur Respiratory: Good respiratory effort, Bilateral Air entry present.  Basal crackles, No wheezes Abdomen: Bowel Sound present, severely distended, no tenderness Extremities: Trace edema Neuro: Alert and oriented x3, no new focal deficit, no asterixis  Data Reviewed: I have Reviewed nursing notes, Vitals, and Lab results. Since last encounter, pertinent lab results CBC and BMP   . I have ordered test including CBC and BMP  . I have discussed pt's care plan and test results with GI, medical oncology, PCCM  .  Also discussed with palliative care. Disposition: Status is: Inpatient Remains inpatient appropriate because: Critically ill.  Care transferred to ICU.  SCDs Start: 03/21/23 1819   Family Communication: No one at bedside Level of care: Stepdown   Vitals:   03/22/23 1300 03/22/23 1400 03/22/23 1500 03/22/23 1600  BP: (!) 100/51 (!) 104/58 116/61 (!) 106/50  Pulse: (!) 105 100 100 89  Resp: (!) 21 19 20 15   Temp:  97.6 F (36.4 C)    TempSrc:  Axillary    SpO2: 97% 96% 100% 98%  Weight:      Height:         Author: Lynden Oxford, MD 03/22/2023   Please look on [email or web address] to find out who is on call.

## 2023-03-23 ENCOUNTER — Inpatient Hospital Stay (HOSPITAL_COMMUNITY): Payer: 59

## 2023-03-23 DIAGNOSIS — K7290 Hepatic failure, unspecified without coma: Secondary | ICD-10-CM | POA: Diagnosis not present

## 2023-03-23 DIAGNOSIS — I9589 Other hypotension: Secondary | ICD-10-CM | POA: Diagnosis not present

## 2023-03-23 DIAGNOSIS — T68XXXA Hypothermia, initial encounter: Secondary | ICD-10-CM | POA: Diagnosis not present

## 2023-03-23 DIAGNOSIS — K7460 Unspecified cirrhosis of liver: Secondary | ICD-10-CM | POA: Diagnosis not present

## 2023-03-23 DIAGNOSIS — D539 Nutritional anemia, unspecified: Secondary | ICD-10-CM | POA: Diagnosis not present

## 2023-03-23 LAB — CBC
HCT: 24 % — ABNORMAL LOW (ref 39.0–52.0)
HCT: 25.4 % — ABNORMAL LOW (ref 39.0–52.0)
HCT: 26.1 % — ABNORMAL LOW (ref 39.0–52.0)
Hemoglobin: 8.2 g/dL — ABNORMAL LOW (ref 13.0–17.0)
Hemoglobin: 8.7 g/dL — ABNORMAL LOW (ref 13.0–17.0)
Hemoglobin: 8.9 g/dL — ABNORMAL LOW (ref 13.0–17.0)
MCH: 31.8 pg (ref 26.0–34.0)
MCH: 32.3 pg (ref 26.0–34.0)
MCH: 32.4 pg (ref 26.0–34.0)
MCHC: 34.2 g/dL (ref 30.0–36.0)
MCHC: 34.3 g/dL (ref 30.0–36.0)
MCHC: 34.1 g/dL (ref 30.0–36.0)
MCV: 93 fL (ref 80.0–100.0)
MCV: 94.4 fL (ref 80.0–100.0)
MCV: 94.9 fL (ref 80.0–100.0)
Platelets: 65 10*3/uL — ABNORMAL LOW (ref 150–400)
Platelets: 70 10*3/uL — ABNORMAL LOW (ref 150–400)
Platelets: 69 10*3/uL — ABNORMAL LOW (ref 150–400)
RBC: 2.58 MIL/uL — ABNORMAL LOW (ref 4.22–5.81)
RBC: 2.69 MIL/uL — ABNORMAL LOW (ref 4.22–5.81)
RBC: 2.75 MIL/uL — ABNORMAL LOW (ref 4.22–5.81)
RDW: 17.2 % — ABNORMAL HIGH (ref 11.5–15.5)
RDW: 18 % — ABNORMAL HIGH (ref 11.5–15.5)
RDW: 18.2 % — ABNORMAL HIGH (ref 11.5–15.5)
WBC: 7.7 10*3/uL (ref 4.0–10.5)
WBC: 8.4 10*3/uL (ref 4.0–10.5)
WBC: 8 10*3/uL (ref 4.0–10.5)
nRBC: 0 % (ref 0.0–0.2)
nRBC: 0 % (ref 0.0–0.2)
nRBC: 0 % (ref 0.0–0.2)

## 2023-03-23 LAB — ECHOCARDIOGRAM COMPLETE
Area-P 1/2: 3.08 cm2
Height: 68 in
S' Lateral: 2.7 cm
Weight: 3019.42 [oz_av]

## 2023-03-23 LAB — GLUCOSE, CAPILLARY
Glucose-Capillary: 101 mg/dL — ABNORMAL HIGH (ref 70–99)
Glucose-Capillary: 107 mg/dL — ABNORMAL HIGH (ref 70–99)
Glucose-Capillary: 125 mg/dL — ABNORMAL HIGH (ref 70–99)
Glucose-Capillary: 131 mg/dL — ABNORMAL HIGH (ref 70–99)
Glucose-Capillary: 96 mg/dL (ref 70–99)
Glucose-Capillary: 97 mg/dL (ref 70–99)
Glucose-Capillary: 97 mg/dL (ref 70–99)

## 2023-03-23 LAB — COMPREHENSIVE METABOLIC PANEL
ALT: 22 U/L (ref 0–44)
AST: 49 U/L — ABNORMAL HIGH (ref 15–41)
Albumin: 3.5 g/dL (ref 3.5–5.0)
Alkaline Phosphatase: 49 U/L (ref 38–126)
Anion gap: 7 (ref 5–15)
BUN: 34 mg/dL — ABNORMAL HIGH (ref 8–23)
CO2: 23 mmol/L (ref 22–32)
Calcium: 9 mg/dL (ref 8.9–10.3)
Chloride: 101 mmol/L (ref 98–111)
Creatinine, Ser: 0.77 mg/dL (ref 0.61–1.24)
GFR, Estimated: >60 mL/min (ref >60)
Glucose, Bld: 105 mg/dL — ABNORMAL HIGH (ref 70–99)
Potassium: 3.6 mmol/L (ref 3.5–5.1)
Sodium: 131 mmol/L — ABNORMAL LOW (ref 135–145)
Total Bilirubin: 5.4 mg/dL — ABNORMAL HIGH (ref 0.3–1.2)
Total Protein: 5.2 g/dL — ABNORMAL LOW (ref 6.5–8.1)

## 2023-03-23 LAB — CYTOLOGY - NON PAP

## 2023-03-23 LAB — PROTIME-INR
INR: 2.7 — ABNORMAL HIGH (ref 0.8–1.2)
Prothrombin Time: 29.1 s — ABNORMAL HIGH (ref 11.4–15.2)

## 2023-03-23 LAB — PREPARE RBC (CROSSMATCH)

## 2023-03-23 LAB — AMMONIA: Ammonia: 70 umol/L — ABNORMAL HIGH (ref 9–35)

## 2023-03-23 MED ORDER — GUAIFENESIN-DM 100-10 MG/5ML PO SYRP
5.0000 mL | ORAL_SOLUTION | ORAL | Status: DC | PRN
  Administered 2023-03-23: 5 mL via ORAL
  Filled 2023-03-23: qty 10

## 2023-03-23 MED ORDER — ORAL CARE MOUTH RINSE
15.0000 mL | OROMUCOSAL | Status: DC | PRN
  Administered 2023-03-23: 15 mL via OROMUCOSAL

## 2023-03-23 MED ORDER — MIDODRINE HCL 5 MG PO TABS
10.0000 mg | ORAL_TABLET | Freq: Three times a day (TID) | ORAL | Status: DC
  Administered 2023-03-23 – 2023-03-30 (×20): 10 mg via ORAL
  Filled 2023-03-23 (×21): qty 2

## 2023-03-23 MED ORDER — LACTULOSE 10 GM/15ML PO SOLN
20.0000 g | Freq: Two times a day (BID) | ORAL | Status: DC
  Administered 2023-03-23 – 2023-03-26 (×7): 20 g via ORAL
  Filled 2023-03-23 (×7): qty 30

## 2023-03-23 MED ORDER — VITAMIN K1 10 MG/ML IJ SOLN
1.0000 mg | Freq: Once | INTRAVENOUS | Status: AC
  Administered 2023-03-23: 1 mg via INTRAVENOUS
  Filled 2023-03-23: qty 0.1

## 2023-03-23 MED ORDER — ALBUMIN HUMAN 25 % IV SOLN
12.5000 g | Freq: Once | INTRAVENOUS | Status: AC
  Administered 2023-03-23: 12.5 g via INTRAVENOUS
  Filled 2023-03-23: qty 50

## 2023-03-23 NOTE — Hospital Course (Addendum)
Brief hospital course: PMH significant of decompensated cirrhosis, anxiety, chronic anemia-iron deficiency, esophageal varices, history of alcohol abuse, refractory ascites.  Present to the hospital with complaints of a possible fall and abdominal distention due to missing paracentesis. Currently being treated for sepsis from SBP, possible bleeding, AKI and encephalopathy. Care transferred to ICU on 8/22.  Was briefly on low-dose levo.  Underwent repeat paracentesis with 4 L removed.  Transfer back to [facility] on 8/23.   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 CP class C. Underwent paracentesis.  Last paracentesis was in August 5 with 12.5 L removed.  Sees Dr. Carrie Mew at [facility]. EGD 10/23 grade 1 and small esophageal varices. TIPS was recommended for the patient outpatient. Was on Cipro daily. Supposed to be on lactulose 30 mL 3 times daily. AFP 6.2 August 24. Paracentesis on 8/21, 10 L removed, paracentesis on 8/22, 4 L removed.  No evidence of hemoperitoneum. Treated aggressively with IV albumin. GI consulted. On IV Protonix. EGD likely on 8/24. N.p.o. after midnight.  Clear liquid diet okay for now.  Hypothermia, suspected sepsis with SBP Etiology of the hypothermia not clear. Given his presentation with a fall, hypotension as well as multiple electrolyte abnormality and organ damage with AKI will treat as potential sepsis. MRSA PCR negative. Will continue IV Zosyn.  Follow-up on cultures.  Blood culture positive 1 out of 4 for staph epi most likely contamination.   Fall at home No focal deficit. CT head C-spine negative for any acute fracture. Monitor for now.   Acute on chronic iron deficiency anemia due to chronic blood loss Hemoglobin 11 in August. Hemoglobin initially 7.7.  Dropped down to 6.5.  CT abdomen negative for retroperitoneal  hemorrhage.  After 1 PRBC hemoglobin dropped again to 5.5. SP 1 PRBC 8/21. 2 PRBC 8/22. 1 PRBC 8/23. Hematology consulted for concern for hemolysis but suspect most likely presentation with GI bleed. Will require diagnostic paracentesis. Lactulose enema revealed melanotic stool. Patient was on Protonix drip.  Now on Protonix every 12 hours. Transfuse for hemoglobin less than 7 or hemodynamic instability. Patient provided consent for blood transfusion.   Hyponatremia. Hyperkalemia Acute kidney injury. Prior sodium 131.  Currently sodium improving. Potassium level improved as well. Serum creatinine remains elevated baseline 0.7. AKI most likely in the setting of ascites causing increased intra-abdominal pressure.  But at risk for hepatorenal syndrome as well. After aggressive treatment with IV albumin and IV blood transfusion serum creatinine stabilized. Elevated potassium most likely in the setting of ongoing use of Aldactone. Treated with Kayexalate enema.  Hyponatremia mostly in the setting of poor p.o. intake as well as cirrhosis.   Hypoglycemia. In the setting of poor p.o. intake. Currently on D10. Might worsen hyponatremia. Advancing to clear liquid diet.   Possible hepatic encephalopathy. Ammonia level 62. Asterixis possible. Will initiate lactulose therapy.  Since unable to tolerate p.o. ordered lactulose enema.   Chronic thrombocytopenia. Worsening, likely dilutional in nature. Hematology following. In the setting of alcohol cirrhosis. Monitor for now.   Hypotension,?  Hemorrhagic shock versus distributive shock in the setting of hypoalbuminemia. History of hypertension Blood pressure soft. Currently holding medication. On midodrine. Required Levophed.   Hyperlipidemia Holding statin for now.   Umbilical hernia without obstruction and without gangrene Monitor for now.  Currently no evidence of obstruction.  Goals of care conversation. Palliative care  consulted. Patient wanted to be full code at  the time of admission.  No HCPOA.  Required emergency consent for PICC line.

## 2023-03-23 NOTE — Progress Notes (Signed)
Subjective: Some melena through flexiseal.  Objective: Vital signs in last 24 hours: Temp:  [98.4 F (36.9 C)-100 F (37.8 C)] 99.1 F (37.3 C) (08/23 1330) Pulse Rate:  [77-103] 83 (08/23 1330) Resp:  [14-27] 14 (08/23 1330) BP: (79-142)/(28-63) 116/54 (08/23 1330) SpO2:  [91 %-100 %] 96 % (08/23 1330) Weight:  [85.6 kg] 85.6 kg (08/23 0500) Weight change: -9.655 kg Last BM Date : 03/23/23  PE: GEN:  Vacillating alertness (but much more awake than yesterday), much older-appearing than age SKIN:  Ecchymoses, jaundiced ABD:  Profound distention with ascites  Lab Results: CBC    Component Value Date/Time   WBC 8.4 03/23/2023 0933   RBC 2.69 (L) 03/23/2023 0933   HGB 8.7 (L) 03/23/2023 0933   HGB 12.2 (L) 05/27/2018 1649   HCT 25.4 (L) 03/23/2023 0933   HCT 36.2 (L) 05/27/2018 1649   PLT 70 (L) 03/23/2023 0933   PLT 116 (L) 05/27/2018 1649   MCV 94.4 03/23/2023 0933   MCV 89 05/27/2018 1649   MCH 32.3 03/23/2023 0933   MCHC 34.3 03/23/2023 0933   RDW 18.0 (H) 03/23/2023 0933   RDW 14.0 05/27/2018 1649   LYMPHSABS 0.8 03/22/2023 0855   MONOABS 0.9 03/22/2023 0855   EOSABS 0.2 03/22/2023 0855   BASOSABS 0.0 03/22/2023 0855  CMP     Component Value Date/Time   NA 131 (L) 03/23/2023 0543   NA 133 (L) 05/27/2018 1649   K 3.6 03/23/2023 0543   CL 101 03/23/2023 0543   CO2 23 03/23/2023 0543   GLUCOSE 105 (H) 03/23/2023 0543   BUN 34 (H) 03/23/2023 0543   BUN 13 05/27/2018 1649   CREATININE 0.77 03/23/2023 0543   CREATININE 0.83 01/07/2014 1308   CALCIUM 9.0 03/23/2023 0543   PROT 5.2 (L) 03/23/2023 0543   PROT 7.6 05/27/2018 1649   ALBUMIN 3.5 03/23/2023 0543   ALBUMIN 4.2 05/27/2018 1649   AST 49 (H) 03/23/2023 0543   ALT 22 03/23/2023 0543   ALKPHOS 49 03/23/2023 0543   BILITOT 5.4 (H) 03/23/2023 0543   BILITOT 0.7 05/27/2018 1649   GFR 95.20 12/22/2022 1101   GFRNONAA >60 03/23/2023 0543   GFRNONAA >89 01/07/2014 1308   Assessment:   Alcohol-mediated  cirrhosis. Ascites:  rapidly reaccumulating.  Paracentesis studies not consistent with SBP. Acute blood loss anemia:  melena noticed yesterday afternoon.  Does not appear to be having enough melena to account for hypotension.  Some hypotension could be from intrinsic liver disease and possible poor venous return to heart in setting of massive ascites. Coagulopathy.  INR 2.7. Alcohol. History of esophageal varices with bleeding requiring multiple banding sessions for eradication.  Small varices only seen on last endoscopy October 2023 with Dr. Lavon Paganini.  Plan:   Vitamin K for coagulopathy and melenic stools. PPI.  I do not see clear indication for octreotide at this time, do not think his melena is variceal process (may have portal gastropathy which is oozing in setting of coagulopathy). Consider repeat paracentesis tomorrow if blood pressure permits; he appears too tenuous at this point for diuretics. Lactulose:  if mental status does not continue to improve, could consider addition of Xifaxan. Follow CBC and transfuse as needed. Clear liquid diet ok. Pending clinical course, consider endoscopy in the next few days.   Nathan Roberts 03/23/2023, 2:20 PM   Cell [phone] If no answer or after 5 PM call [phone]

## 2023-03-23 NOTE — Plan of Care (Signed)
  Problem: Coping: Goal: Ability to adjust to condition or change in health will improve Outcome: Progressing   Problem: Fluid Volume: Goal: Ability to maintain a balanced intake and output will improve Outcome: Progressing   Problem: Metabolic: Goal: Ability to maintain appropriate glucose levels will improve Outcome: Progressing   Problem: Skin Integrity: Goal: Risk for impaired skin integrity will decrease Outcome: Progressing   Problem: Tissue Perfusion: Goal: Adequacy of tissue perfusion will improve Outcome: Progressing   Problem: Education: Goal: Knowledge of General Education information will improve Description: Including pain rating scale, medication(s)/side effects and non-pharmacologic comfort measures Outcome: Progressing   Problem: Health Behavior/Discharge Planning: Goal: Ability to manage health-related needs will improve Outcome: Progressing   Problem: Clinical Measurements: Goal: Ability to maintain clinical measurements within normal limits will improve Outcome: Progressing Goal: Will remain free from infection Outcome: Progressing Goal: Diagnostic test results will improve Outcome: Progressing Goal: Respiratory complications will improve Outcome: Progressing Goal: Cardiovascular complication will be avoided Outcome: Progressing   Problem: Activity: Goal: Risk for activity intolerance will decrease Outcome: Progressing   Problem: Coping: Goal: Level of anxiety will decrease Outcome: Progressing   Problem: Elimination: Goal: Will not experience complications related to bowel motility Outcome: Progressing Goal: Will not experience complications related to urinary retention Outcome: Progressing   Problem: Pain Managment: Goal: General experience of comfort will improve Outcome: Progressing   Problem: Safety: Goal: Ability to remain free from injury will improve Outcome: Progressing   Problem: Skin Integrity: Goal: Risk for impaired skin  integrity will decrease Outcome: Progressing   Cindy S. Clelia Croft BSN, RN, [employer], CCRN 03/23/2023 1:41 AM

## 2023-03-23 NOTE — Progress Notes (Signed)
PHARMACY - PHYSICIAN COMMUNICATION CRITICAL VALUE ALERT - BLOOD CULTURE IDENTIFICATION (BCID)  Nathan Roberts is an 62 y.o. male who presented to [facility] on 03/21/2023 with a chief complaint of anemia  Assessment: 1/4 GPC, staph epi, no Resistance  Name of physician (or Provider) Contacted: Ogan  Current antibiotics: zosyn  Changes to prescribed antibiotics recommended:  None, probable contaminant  Results for orders placed or performed during the hospital encounter of 03/21/23  Blood Culture ID Panel (Reflexed) (Collected: 03/21/2023  7:37 PM)  Result Value Ref Range   Enterococcus faecalis NOT DETECTED NOT DETECTED   Enterococcus Faecium NOT DETECTED NOT DETECTED   Listeria monocytogenes NOT DETECTED NOT DETECTED   Staphylococcus species DETECTED (A) NOT DETECTED   Staphylococcus aureus (BCID) NOT DETECTED NOT DETECTED   Staphylococcus epidermidis DETECTED (A) NOT DETECTED   Staphylococcus lugdunensis NOT DETECTED NOT DETECTED   Streptococcus species NOT DETECTED NOT DETECTED   Streptococcus agalactiae NOT DETECTED NOT DETECTED   Streptococcus pneumoniae NOT DETECTED NOT DETECTED   Streptococcus pyogenes NOT DETECTED NOT DETECTED   A.calcoaceticus-baumannii NOT DETECTED NOT DETECTED   Bacteroides fragilis NOT DETECTED NOT DETECTED   Enterobacterales NOT DETECTED NOT DETECTED   Enterobacter cloacae complex NOT DETECTED NOT DETECTED   Escherichia coli NOT DETECTED NOT DETECTED   Klebsiella aerogenes NOT DETECTED NOT DETECTED   Klebsiella oxytoca NOT DETECTED NOT DETECTED   Klebsiella pneumoniae NOT DETECTED NOT DETECTED   Proteus species NOT DETECTED NOT DETECTED   Salmonella species NOT DETECTED NOT DETECTED   Serratia marcescens NOT DETECTED NOT DETECTED   Haemophilus influenzae NOT DETECTED NOT DETECTED   Neisseria meningitidis NOT DETECTED NOT DETECTED   Pseudomonas aeruginosa NOT DETECTED NOT DETECTED   Stenotrophomonas maltophilia NOT DETECTED NOT DETECTED    Candida albicans NOT DETECTED NOT DETECTED   Candida auris NOT DETECTED NOT DETECTED   Candida glabrata NOT DETECTED NOT DETECTED   Candida krusei NOT DETECTED NOT DETECTED   Candida parapsilosis NOT DETECTED NOT DETECTED   Candida tropicalis NOT DETECTED NOT DETECTED   Cryptococcus neoformans/gattii NOT DETECTED NOT DETECTED   Methicillin resistance mecA/C NOT DETECTED NOT DETECTED    Arley Phenix RPh 03/23/2023, 1:07 AM

## 2023-03-23 NOTE — Progress Notes (Signed)
  Echocardiogram 2D Echocardiogram has been performed.  Nathan Roberts 03/23/2023, 8:35 AM

## 2023-03-23 NOTE — Progress Notes (Signed)
ASEEL LAWS   DOB:04/16/1961   MW#:[id number]    ASSESSMENT & PLAN:   Severe anemia, on background history of GI bleed I suspect he might have GI bleed/ or retroperitoneal bleed although this cannot be proven without performing additional workup For now, I recommend blood transfusion to keep hemoglobin above 7 There could also be a component of anemia chronic illness given high BUN and creatinine His blood count has improved He could benefit from regular follow-up in the near future in the hematology clinic   Severe thrombocytopenia Multifactorial, could be due to consumption and worsening liver failure He does not need platelet transfusion right now   Decompensated liver failure, ascites, synthetic liver dysfunction Continue supportive care   Altered mental status I am wondering whether he might have spontaneous bacterial peritonitis Continue antibiotics Blood culture showed gram-positive, contaminant cannot be excluded His mental status has improved   I will follow All questions were answered. The patient knows to call the clinic with any problems, questions or concerns.   The total time spent in the appointment was 30 minutes encounter with patients including review of chart and various tests results, discussions about plan of care and coordination of care plan  Artis Delay, MD 03/23/2023 1:53 PM  Subjective:  Patient is seen in the ICU.  He appears more alert and oriented Nursing staff did not see any signs of bleeding today The patient is initially stated he lives alone but then stated his contact is his wife.   The address of his wife is in [place] in [place] He did not recall being sick recently  Objective:  Vitals:   03/23/23 1300 03/23/23 1330  BP: (!) 98/49 (!) 116/54  Pulse: 81 83  Resp: 20 14  Temp: 99.1 F (37.3 C) 99.1 F (37.3 C)  SpO2: 96% 96%     Intake/Output Summary (Last 24 hours) at 03/23/2023 1353 Last data filed at 03/23/2023  1350 Gross per 24 hour  Intake 3494.7 ml  Output 7450 ml  Net -3955.3 ml    GENERAL:alert, no distress and comfortable.  He is alert and somewhat oriented but not completely SKIN: skin color is pale, texture, turgor are normal, no rashes or significant lesions EYES: normal, Conjunctiva with signs of icterus ABDOMEN:abdomen distended with ascites   Labs:  Recent Labs    03/21/23 1818 03/22/23 0306 03/22/23 1540 03/23/23 0543  NA 124* 127* 131* 131*  K 6.3* 5.6* 4.4 3.6  CL 95* 97* 101 101  CO2 15* 21* 23 23  GLUCOSE 87 95 98 105*  BUN 57* 60* 49* 34*  CREATININE 1.37* 1.25* 1.01 0.77  CALCIUM 8.9 8.9 9.0 9.0  GFRNONAA 58* >60 >60 >60  PROT 5.8* 5.4*  --  5.2*  ALBUMIN 2.5* 3.0*  --  3.5  AST 77* 71*  --  49*  ALT 32 28  --  22  ALKPHOS 77 60  --  49  BILITOT 3.8* 4.2*  --  5.4*    Studies:  ECHOCARDIOGRAM COMPLETE  Result Date: 03/23/2023    ECHOCARDIOGRAM REPORT   Patient Name:   KAMREN RYKOWSKI Date of Exam: 03/23/2023 Medical Rec #:  [id number]        Height:       68.0 in Accession #:    [id number]       Weight:       188.7 lb Date of Birth:  Sep 06, 1960        BSA:  1.994 m Patient Age:    62 years         BP:           114/36 mmHg Patient Gender: M                HR:           85 bpm. Exam Location:  Inpatient Procedure: 2D Echo, Color Doppler and Cardiac Doppler Indications:    Hypotension                 Cirrhosis  History:        Patient has no prior history of Echocardiogram examinations.                 Risk Factors:Hypertension, Dyslipidemia and Diabetes. Alcoholic                 cirrhosis, ascites, ETOH.  Sonographer:    Milda Smart Referring Phys: [id number] PAULA B SIMPSON  Sonographer Comments: Image acquisition challenging due to patient body habitus and Image acquisition challenging due to respiratory motion. IMPRESSIONS  1. Left ventricular ejection fraction, by estimation, is 65 to 70%. The left ventricle has normal function. The left ventricle has no  regional wall motion abnormalities. Left ventricular diastolic parameters were normal.  2. Right ventricular systolic function is normal. The right ventricular size is normal.  3. Left atrial size was moderately dilated.  4. The mitral valve is normal in structure. No evidence of mitral valve regurgitation. No evidence of mitral stenosis.  5. The aortic valve is tricuspid. There is mild calcification of the aortic valve. Aortic valve regurgitation is not visualized. Aortic valve sclerosis/calcification is present, without any evidence of aortic stenosis.  6. Aortic dilatation noted. There is borderline dilatation of the ascending aorta, measuring 39 mm. FINDINGS  Left Ventricle: Left ventricular ejection fraction, by estimation, is 65 to 70%. The left ventricle has normal function. The left ventricle has no regional wall motion abnormalities. The left ventricular internal cavity size was normal in size. There is  no left ventricular hypertrophy. Left ventricular diastolic parameters were normal. Right Ventricle: The right ventricular size is normal. No increase in right ventricular wall thickness. Right ventricular systolic function is normal. Left Atrium: Left atrial size was moderately dilated. Right Atrium: Right atrial size was normal in size. Pericardium: There is no evidence of pericardial effusion. Mitral Valve: The mitral valve is normal in structure. No evidence of mitral valve regurgitation. No evidence of mitral valve stenosis. Tricuspid Valve: The tricuspid valve is normal in structure. Tricuspid valve regurgitation is mild . No evidence of tricuspid stenosis. Aortic Valve: The aortic valve is tricuspid. There is mild calcification of the aortic valve. Aortic valve regurgitation is not visualized. Aortic valve sclerosis/calcification is present, without any evidence of aortic stenosis. Pulmonic Valve: The pulmonic valve was normal in structure. Pulmonic valve regurgitation is trivial. No evidence of  pulmonic stenosis. Aorta: Aortic dilatation noted. There is borderline dilatation of the ascending aorta, measuring 39 mm. Venous: The inferior vena cava was not well visualized. IAS/Shunts: No atrial level shunt detected by color flow Doppler.  LEFT VENTRICLE PLAX 2D LVIDd:         4.50 cm   Diastology LVIDs:         2.70 cm   LV e' medial:    8.59 cm/s LV PW:         1.10 cm   LV E/e' medial:  9.5 LV IVS:  0.90 cm   LV e' lateral:   9.03 cm/s LVOT diam:     2.00 cm   LV E/e' lateral: 9.1 LV SV:         97 LV SV Index:   49 LVOT Area:     3.14 cm  RIGHT VENTRICLE RV S prime:     16.40 cm/s TAPSE (M-mode): 2.6 cm LEFT ATRIUM             Index        RIGHT ATRIUM           Index LA diam:        4.20 cm 2.11 cm/m   RA Area:     13.20 cm LA Vol (A2C):   84.7 ml 42.48 ml/m  RA Volume:   31.80 ml  15.95 ml/m LA Vol (A4C):   78.2 ml 39.22 ml/m LA Biplane Vol: 78.8 ml 39.52 ml/m  AORTIC VALVE LVOT Vmax:   157.00 cm/s LVOT Vmean:  117.000 cm/s LVOT VTI:    0.310 m  AORTA Ao Root diam: 3.40 cm Ao Asc diam:  3.90 cm MITRAL VALVE               TRICUSPID VALVE MV Area (PHT): 3.08 cm    TR Peak grad:   28.5 mmHg MV Decel Time: 246 msec    TR Mean grad:   20.0 mmHg MV E velocity: 81.80 cm/s  TR Vmax:        267.00 cm/s MV A velocity: 88.30 cm/s  TR Vmean:       216.0 cm/s MV E/A ratio:  0.93                            SHUNTS                            Systemic VTI:  0.31 m                            Systemic Diam: 2.00 cm Arvilla Meres MD Electronically signed by Arvilla Meres MD Signature Date/Time: 03/23/2023/9:02:07 AM    Final    [place] EKG SITE RITE  Result Date: 03/22/2023 If Site Rite image not attached, placement could not be confirmed due to current cardiac rhythm.  CT ABDOMEN PELVIS WO CONTRAST  Result Date: 03/22/2023 CLINICAL DATA:  Retroperitoneal bleed suspected. Fall, abdominal distention. EXAM: CT ABDOMEN AND PELVIS WITHOUT CONTRAST TECHNIQUE: Multidetector CT imaging of the abdomen and  pelvis was performed following the standard protocol without IV contrast. RADIATION DOSE REDUCTION: This exam was performed according to the departmental dose-optimization program which includes automated exposure control, adjustment of the mA and/or kV according to patient size and/or use of iterative reconstruction technique. COMPARISON:  01/10/2023 FINDINGS: Lower chest: Bibasilar atelectasis. Coronary artery and aortic atherosclerosis. Hepatobiliary: Nodular, shrunken liver compatible with cirrhosis. Gallbladder contracted, grossly unremarkable. Pancreas: No focal abnormality or ductal dilatation. Spleen: No focal abnormality.  Normal size. Adrenals/Urinary Tract: No adrenal abnormality. No focal renal abnormality. No stones or hydronephrosis. Urinary bladder is unremarkable. Stomach/Bowel: Diffuse colonic diverticulosis. No active diverticulitis. Stomach and small bowel decompressed. No bowel obstruction. Vascular/Lymphatic: Aortic atherosclerosis. No evidence of aneurysm or adenopathy. Reproductive: No visible focal abnormality. Other: Large volume ascites. Moderate-sized umbilical hernia containing ascites, stable. Musculoskeletal: No acute bony abnormality. IMPRESSION: Cirrhosis with associated large volume ascites. Colonic  diverticulosis. Umbilical hernia containing fluid. Aortic atherosclerosis. Electronically Signed   By: Charlett Nose M.D.   On: 03/22/2023 00:47   US Paracentesis  Result Date: 03/21/2023 INDICATION: Patient with history of alcoholic cirrhosis, portal hypertension, refractory ascites; request received for therapeutic paracentesis EXAM: ULTRASOUND GUIDED THERAPEUTIC PARACENTESIS MEDICATIONS: 8 mL 1% lidocaine COMPLICATIONS: None immediate. PROCEDURE: Informed written consent was obtained from the patient after a discussion of the risks, benefits and alternatives to treatment. A timeout was performed prior to the initiation of the procedure. Initial ultrasound scanning demonstrates a  large amount of ascites within the right lower abdominal quadrant. The right lower abdomen was prepped and draped in the usual sterile fashion. 1% lidocaine was used for local anesthesia. Following this, a 19 gauge, 7-cm, Yueh catheter was introduced. An ultrasound image was saved for documentation purposes. The paracentesis was performed. The catheter was removed and a dressing was applied. The patient tolerated the procedure well without immediate post procedural complication. Patient will tentatively receive post-procedure intravenous albumin in ED; see nursing notes for details. FINDINGS: A total of approximately 10 liters of slightly hazy, yellow fluid was removed. IMPRESSION: Successful ultrasound-guided therapeutic paracentesis yielding 10 liters of peritoneal fluid. PLAN: The patient has required >/=2 paracenteses in a 30 day period and a formal evaluation by the [facility] has been arranged. Performed by: Artemio Aly Electronically Signed   By: Simonne Come M.D.   On: 03/21/2023 16:21   CT HEAD WO CONTRAST ( )  Result Date: 03/21/2023 CLINICAL DATA:  Facial trauma, blunt; Neck trauma (Age >= 65y) EXAM: CT HEAD WITHOUT CONTRAST CT CERVICAL SPINE WITHOUT CONTRAST TECHNIQUE: Multidetector CT imaging of the head and cervical spine was performed following the standard protocol without intravenous contrast. Multiplanar CT image reconstructions of the cervical spine were also generated. RADIATION DOSE REDUCTION: This exam was performed according to the departmental dose-optimization program which includes automated exposure control, adjustment of the mA and/or kV according to patient size and/or use of iterative reconstruction technique. COMPARISON:  None Available. FINDINGS: CT HEAD FINDINGS Brain: No evidence of acute infarction, hemorrhage, hydrocephalus, extra-axial collection or mass lesion/mass effect. Vascular: No hyperdense vessel or unexpected  calcification. Skull: Normal. Negative for fracture or focal lesion. Sinuses/Orbits: No middle ear effusion. Polypoid mucosal maxillary. Orbits are Other: None CT CERVICAL SPINE FINDINGS Alignment: Normal. Skull base and vertebrae: No acute fracture. No primary bone lesion or focal pathologic process. Status post C4-C5 ACDF solid osseous fusion. Soft tissues and spinal canal: No prevertebral fluid or swelling. No visible canal hematoma. Disc levels:  No evidence of high-grade spinal canal stenosis. 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 Vascular findings suggestive of anemia. Other: None IMPRESSION: 1. No acute intracranial abnormality. 2. No acute fracture or traumatic malalignment of the cervical spine. Electronically Signed   By: Lorenza Cambridge M.D.   On: 03/21/2023 15:13   CT Cervical Spine Wo Contrast  Result Date: 03/21/2023 CLINICAL DATA:  Facial trauma, blunt; Neck trauma (Age >= 65y) EXAM: CT HEAD WITHOUT CONTRAST CT CERVICAL SPINE WITHOUT CONTRAST TECHNIQUE: Multidetector CT imaging of the head and cervical spine was performed following the standard protocol without intravenous contrast. Multiplanar CT image reconstructions of the cervical spine were also generated. RADIATION DOSE REDUCTION: This exam was performed according to the departmental dose-optimization program which includes automated exposure control, adjustment of the mA and/or kV according to patient size and/or use of iterative reconstruction technique. COMPARISON:  None Available. FINDINGS: CT HEAD FINDINGS Brain: No  evidence of acute infarction, hemorrhage, hydrocephalus, extra-axial collection or mass lesion/mass effect. Vascular: No hyperdense vessel or unexpected calcification. Skull: Normal. Negative for fracture or focal lesion. Sinuses/Orbits: No middle ear effusion. Polypoid mucosal maxillary. Orbits are Other: None CT CERVICAL SPINE FINDINGS Alignment: Normal. Skull base and vertebrae: No acute fracture. No primary  bone lesion or focal pathologic process. Status post C4-C5 ACDF solid osseous fusion. Soft tissues and spinal canal: No prevertebral fluid or swelling. No visible canal hematoma. Disc levels:  No evidence of high-grade spinal canal stenosis. 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 Vascular findings suggestive of anemia. Other: None IMPRESSION: 1. No acute intracranial abnormality. 2. No acute fracture or traumatic malalignment of the cervical spine. Electronically Signed   By: Lorenza Cambridge M.D.   On: 03/21/2023 15:13   DG Chest Portable 1 View  Result Date: 03/21/2023 CLINICAL DATA:  Shortness of breath pain after fall EXAM: PORTABLE CHEST 1 VIEW COMPARISON:  01/10/2023 x-ray and older FINDINGS: Poor inflation. Grossly no consolidation, pneumothorax or effusion. No edema. Normal cardiopericardial silhouette. Overall if there is concern further of sequela of trauma additional contrast CT as clinically appropriate. IMPRESSION: Poor inflation.  Grossly no acute cardiopulmonary disease Electronically Signed   By: Karen Kays M.D.   On: 03/21/2023 14:13

## 2023-03-23 NOTE — Progress Notes (Signed)
[facility] Progress Note Patient: Nathan Roberts ZOX:[id number] DOB: 02-Nov-1960 DOA: 03/21/2023  DOS: the patient was seen and examined on 03/23/2023  Brief hospital course: PMH significant of decompensated cirrhosis, anxiety, chronic anemia-iron deficiency, esophageal varices, history of alcohol abuse, refractory ascites.  Present to the hospital with complaints of a possible fall and abdominal distention due to missing paracentesis. Currently being treated for sepsis from SBP, possible bleeding, AKI and encephalopathy. Care transferred to ICU on 8/22.  Was briefly on low-dose levo.  Underwent repeat paracentesis with 4 L removed.  Transfer back to [facility] on 8/23.   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 CP class C. Underwent paracentesis.  Last paracentesis was in August 5 with 12.5 L removed.  Sees Dr. Carrie Mew at [facility]. EGD 10/23 grade 1 and small esophageal varices. TIPS was recommended for the patient outpatient. Was on Cipro daily. Supposed to be on lactulose 30 mL 3 times daily. AFP 6.2 August 24. Paracentesis on 8/21, 10 L removed, paracentesis on 8/22, 4 L removed.  No evidence of hemoperitoneum. Treated aggressively with IV albumin. GI consulted. On IV Protonix. EGD likely on 8/24. N.p.o. after midnight.  Clear liquid diet okay for now.  Hypothermia, suspected sepsis with SBP Etiology of the hypothermia not clear. Given his presentation with a fall, hypotension as well as multiple electrolyte abnormality and organ damage with AKI will treat as potential sepsis. MRSA PCR negative. Will continue IV Zosyn.  Follow-up on cultures.  Blood culture positive 1 out of 4 for staph epi most likely contamination.   Fall at home No focal deficit. CT head C-spine negative for any acute fracture. Monitor for now.   Acute on chronic iron  deficiency anemia due to chronic blood loss Hemoglobin 11 in August. Hemoglobin initially 7.7.  Dropped down to 6.5.  CT abdomen negative for retroperitoneal hemorrhage.  After 1 PRBC hemoglobin dropped again to 5.5. SP 1 PRBC 8/21. 2 PRBC 8/22. 1 PRBC 8/23. Hematology consulted for concern for hemolysis but suspect most likely presentation with GI bleed. Will require diagnostic paracentesis. Lactulose enema revealed melanotic stool. Patient was on Protonix drip.  Now on Protonix every 12 hours. Transfuse for hemoglobin less than 7 or hemodynamic instability. Patient provided consent for blood transfusion.   Hyponatremia. Hyperkalemia Acute kidney injury. Prior sodium 131.  Currently sodium improving. Potassium level improved as well. Serum creatinine remains elevated baseline 0.7. AKI most likely in the setting of ascites causing increased intra-abdominal pressure.  But at risk for hepatorenal syndrome as well. After aggressive treatment with IV albumin and IV blood transfusion serum creatinine stabilized. Elevated potassium most likely in the setting of ongoing use of Aldactone. Treated with Kayexalate enema.  Hyponatremia mostly in the setting of poor p.o. intake as well as cirrhosis.   Hypoglycemia. In the setting of poor p.o. intake. Currently on D10. Might worsen hyponatremia. Advancing to clear liquid diet.   Possible hepatic encephalopathy. Ammonia level 62. Asterixis possible. Will initiate lactulose therapy.  Since unable to tolerate p.o. ordered lactulose enema.   Chronic thrombocytopenia. Worsening, likely dilutional in nature. Hematology following. In the setting of alcohol cirrhosis. Monitor for now.   Hypotension,?  Hemorrhagic shock versus distributive shock in the setting of hypoalbuminemia. History of hypertension Blood pressure soft. Currently holding medication. On midodrine. Required Levophed.   Hyperlipidemia Holding statin for now.    Umbilical hernia without obstruction and without gangrene  Monitor for now.  Currently no evidence of obstruction.  Goals of care conversation. Palliative care consulted. Patient wanted to be full code at the time of admission.  No HCPOA.  Required emergency consent for PICC line.   Subjective: No nausea or vomiting.  No abdominal pain.  No chest pain.  Mentation improving.  Physical Exam: General: in Mild distress, No Rash, bleeding from the lips. Cardiovascular: S1 and S2 Present, No Murmur Respiratory: Good respiratory effort, Bilateral Air entry present. No Crackles, No wheezes Abdomen: Bowel Sound present,, distended but no tenderness Extremities: Trace edema Neuro: Alert and oriented x3, no new focal deficit, asterixis present.  Able to follow commands clearly.  Data Reviewed: I have Reviewed nursing notes, Vitals, and Lab results. Since last encounter, pertinent lab results CBC and BMP   . I have ordered test including CBC and BMP  . I have discussed pt's care plan and test results with GI, PCCM  .  Disposition: Status is: Inpatient Remains inpatient appropriate because: Critically ill.  Requiring ICU stay.  SCDs Start: 03/21/23 1819   Family Communication: No one at bedside Level of care: ICU continue in the ICU as at risk for further deterioration of hemodynamics. Vitals:   03/23/23 1200 03/23/23 1230 03/23/23 1300 03/23/23 1330  BP: (!) 122/51 (!) 141/53 (!) 98/49 (!) 116/54  Pulse: 78 79 81 83  Resp: 19 (!) 21 20 14   Temp: 99.1 F (37.3 C) 99.1 F (37.3 C) 99.1 F (37.3 C) 99.1 F (37.3 C)  TempSrc:      SpO2: 91% 94% 96% 96%  Weight:      Height:         Author: Lynden Oxford, MD 03/23/2023 1:52 PM  Please look on [email or web address] to find out who is on call.

## 2023-03-23 NOTE — Progress Notes (Signed)
TRH to resume care after discussing with Dr. Allena Katz.  PCCM will sign off.  Please call us back if we can be of any further assistance.     Posey Boyer, MSN, NP, AG-ACNP-BC [facility] 03/23/2023, 7:42 AM  See Amion for pager If no response to pager , please call [phone] until 7pm After 7:00 pm call Elink  [phone]

## 2023-03-23 NOTE — Progress Notes (Signed)
OT Cancellation Note  Patient Details Name: SAARIM BERTOT MRN: [id number] DOB: [birth year]   Cancelled Treatment:    Reason Eval/Treat Not Completed: Medical issues which prohibited therapy. Per conversation with nurse, pt not ready/appropriate for OT today. Plan to reattempt at a later date.   Raynald Kemp, OT Acute Rehabilitation Services Office: [phone]   Pilar Grammes 03/23/2023, 9:09 AM

## 2023-03-23 NOTE — Progress Notes (Signed)
PHARMACY - PHYSICIAN COMMUNICATION CRITICAL VALUE ALERT - BLOOD CULTURE IDENTIFICATION (BCID)  Nathan Roberts is an 62 y.o. male who presented to [facility] on 03/21/2023 with a chief complaint of anemia  Assessment: 1/4 GPC, staph epi, no Resistance initially, now also growing gram positive rods in 1 of 4 bottles   Name of physician (or Provider) Contacted: Allena Katz  Current antibiotics: zosyn  Changes to prescribed antibiotics recommended:  None, probable contaminant  Results for orders placed or performed during the hospital encounter of 03/21/23  Blood Culture ID Panel (Reflexed) (Collected: 03/21/2023  7:37 PM)  Result Value Ref Range   Enterococcus faecalis NOT DETECTED NOT DETECTED   Enterococcus Faecium NOT DETECTED NOT DETECTED   Listeria monocytogenes NOT DETECTED NOT DETECTED   Staphylococcus species DETECTED (A) NOT DETECTED   Staphylococcus aureus (BCID) NOT DETECTED NOT DETECTED   Staphylococcus epidermidis DETECTED (A) NOT DETECTED   Staphylococcus lugdunensis NOT DETECTED NOT DETECTED   Streptococcus species NOT DETECTED NOT DETECTED   Streptococcus agalactiae NOT DETECTED NOT DETECTED   Streptococcus pneumoniae NOT DETECTED NOT DETECTED   Streptococcus pyogenes NOT DETECTED NOT DETECTED   A.calcoaceticus-baumannii NOT DETECTED NOT DETECTED   Bacteroides fragilis NOT DETECTED NOT DETECTED   Enterobacterales NOT DETECTED NOT DETECTED   Enterobacter cloacae complex NOT DETECTED NOT DETECTED   Escherichia coli NOT DETECTED NOT DETECTED   Klebsiella aerogenes NOT DETECTED NOT DETECTED   Klebsiella oxytoca NOT DETECTED NOT DETECTED   Klebsiella pneumoniae NOT DETECTED NOT DETECTED   Proteus species NOT DETECTED NOT DETECTED   Salmonella species NOT DETECTED NOT DETECTED   Serratia marcescens NOT DETECTED NOT DETECTED   Haemophilus influenzae NOT DETECTED NOT DETECTED   Neisseria meningitidis NOT DETECTED NOT DETECTED   Pseudomonas aeruginosa NOT DETECTED NOT DETECTED    Stenotrophomonas maltophilia NOT DETECTED NOT DETECTED   Candida albicans NOT DETECTED NOT DETECTED   Candida auris NOT DETECTED NOT DETECTED   Candida glabrata NOT DETECTED NOT DETECTED   Candida krusei NOT DETECTED NOT DETECTED   Candida parapsilosis NOT DETECTED NOT DETECTED   Candida tropicalis NOT DETECTED NOT DETECTED   Cryptococcus neoformans/gattii NOT DETECTED NOT DETECTED   Methicillin resistance mecA/C NOT DETECTED NOT DETECTED    Hessie Knows, PharmD, BCPS Secure Chat if ?s 03/23/2023 1:16 PM

## 2023-03-24 DIAGNOSIS — K7031 Alcoholic cirrhosis of liver with ascites: Secondary | ICD-10-CM | POA: Diagnosis not present

## 2023-03-24 DIAGNOSIS — Z515 Encounter for palliative care: Secondary | ICD-10-CM | POA: Diagnosis not present

## 2023-03-24 DIAGNOSIS — Z7189 Other specified counseling: Secondary | ICD-10-CM | POA: Diagnosis not present

## 2023-03-24 DIAGNOSIS — D539 Nutritional anemia, unspecified: Secondary | ICD-10-CM | POA: Diagnosis not present

## 2023-03-24 DIAGNOSIS — K7290 Hepatic failure, unspecified without coma: Secondary | ICD-10-CM | POA: Diagnosis not present

## 2023-03-24 LAB — GLUCOSE, CAPILLARY
Glucose-Capillary: 124 mg/dL — ABNORMAL HIGH (ref 70–99)
Glucose-Capillary: 117 mg/dL — ABNORMAL HIGH (ref 70–99)
Glucose-Capillary: 104 mg/dL — ABNORMAL HIGH (ref 70–99)
Glucose-Capillary: 135 mg/dL — ABNORMAL HIGH (ref 70–99)
Glucose-Capillary: 147 mg/dL — ABNORMAL HIGH (ref 70–99)

## 2023-03-24 LAB — CBC
HCT: 26.7 % — ABNORMAL LOW (ref 39.0–52.0)
Hemoglobin: 9 g/dL — ABNORMAL LOW (ref 13.0–17.0)
MCH: 32.5 pg (ref 26.0–34.0)
MCHC: 33.7 g/dL (ref 30.0–36.0)
MCV: 96.4 fL (ref 80.0–100.0)
Platelets: 70 10*3/uL — ABNORMAL LOW (ref 150–400)
RBC: 2.77 MIL/uL — ABNORMAL LOW (ref 4.22–5.81)
RDW: 18.5 % — ABNORMAL HIGH (ref 11.5–15.5)
WBC: 7.2 10*3/uL (ref 4.0–10.5)
nRBC: 0 % (ref 0.0–0.2)

## 2023-03-24 LAB — COMPREHENSIVE METABOLIC PANEL
ALT: 21 U/L (ref 0–44)
AST: 49 U/L — ABNORMAL HIGH (ref 15–41)
Albumin: 3.3 g/dL — ABNORMAL LOW (ref 3.5–5.0)
Alkaline Phosphatase: 56 U/L (ref 38–126)
Anion gap: 6 (ref 5–15)
BUN: 23 mg/dL (ref 8–23)
CO2: 24 mmol/L (ref 22–32)
Calcium: 8.5 mg/dL — ABNORMAL LOW (ref 8.9–10.3)
Chloride: 102 mmol/L (ref 98–111)
Creatinine, Ser: 0.78 mg/dL (ref 0.61–1.24)
GFR, Estimated: >60 mL/min (ref >60)
Glucose, Bld: 136 mg/dL — ABNORMAL HIGH (ref 70–99)
Potassium: 3.4 mmol/L — ABNORMAL LOW (ref 3.5–5.1)
Sodium: 132 mmol/L — ABNORMAL LOW (ref 135–145)
Total Bilirubin: 5.4 mg/dL — ABNORMAL HIGH (ref 0.3–1.2)
Total Protein: 4.9 g/dL — ABNORMAL LOW (ref 6.5–8.1)

## 2023-03-24 LAB — PROTIME-INR
INR: 2.5 — ABNORMAL HIGH (ref 0.8–1.2)
Prothrombin Time: 26.9 s — ABNORMAL HIGH (ref 11.4–15.2)

## 2023-03-24 LAB — AMMONIA: Ammonia: 52 umol/L — ABNORMAL HIGH (ref 9–35)

## 2023-03-24 MED ORDER — VITAMIN K1 10 MG/ML IJ SOLN: 1.0000 mg | Freq: Once | INTRAVENOUS | Status: DC

## 2023-03-24 MED ORDER — ENSURE ENLIVE PO LIQD
237.0000 mL | Freq: Two times a day (BID) | ORAL | Status: DC
  Administered 2023-03-24 – 2023-03-25 (×2): 237 mL via ORAL

## 2023-03-24 MED ORDER — PHYTONADIONE 5 MG PO TABS
2.5000 mg | ORAL_TABLET | Freq: Once | ORAL | Status: AC
  Administered 2023-03-24: 2.5 mg via ORAL
  Filled 2023-03-24: qty 1

## 2023-03-24 MED ORDER — ACETAMINOPHEN 325 MG PO TABS
325.0000 mg | ORAL_TABLET | Freq: Four times a day (QID) | ORAL | Status: DC | PRN
  Administered 2023-03-24: 325 mg via ORAL
  Filled 2023-03-24: qty 1

## 2023-03-24 NOTE — Consult Note (Signed)
Consultation Note Date: 03/24/2023   Patient Name: Nathan Roberts  DOB: September 27, 1960  MRN: [id number]  Age / Sex: 62 y.o., male  PCP: Clayborne Dana, NP Referring Physician: Rolly Salter, MD  Reason for Consultation: Establishing goals of care  HPI/Patient Profile: 62 y.o. male   admitted on 03/21/2023   Clinical Assessment and Goals of Care: 62 year old gentleman with a past medical history of decompensated cirrhosis, anxiety chronic anemia-iron deficiency, esophageal varices.  Patient has history of alcohol use and refractory ascites. Admitted to hospital medicine service with complaints of possible fall, abdominal distention, required paracentesis.  Currently under treatment for possible spontaneous bacterial peritonitis and possible bleeding.  Hospital course further complicated by acute kidney injury and encephalopathy. Palliative consult for broad goals of care discussions has been recommended. Chart reviewed, patient seen and examined.  Patient was seen on 03-23-2023.  He was noted to be a little bit confused.  He is able to answer a few yes/no type questions appropriately however was not able to participate in detail with goals of care discussions.  Overall, patient requested desire to continue current mode of care and to continue tests and procedures and medical treatments aimed towards a stabilization/recovery.  Call placed unable to reach ex-wife, reattempted on 03-24-2023 without success.  NEXT OF KIN Next of kin is listed as ex-wife Thaden Abajian [phone].  She lives in [place].  SUMMARY OF RECOMMENDATIONS   Full code/full scope Recommend outpatient palliative support. Continue current mode of care No new inpatient PMT specific recommendations at this time. Thank you for the consult.  Code Status/Advance Care Planning: Full code   Symptom Management:     Palliative  Prophylaxis:  Delirium Protocol  Additional Recommendations (Limitations, Scope, Preferences): Full Scope Treatment  Psycho-social/Spiritual:  Desire for further Chaplaincy support:yes Additional Recommendations: Caregiving  Support/Resources  Prognosis:  Unable to determine  Discharge Planning: To Be Determined      Primary Diagnoses: Present on Admission:  Hypothermia  Alcohol use  Ascites due to alcoholic cirrhosis (HCC)  Decompensated hepatic cirrhosis (HCC)  Esophageal varices (HCC)  Hyperlipidemia  Hypertension  Iron deficiency anemia due to chronic blood loss  Umbilical hernia without obstruction and without gangrene  Deficiency anemia   I have reviewed the medical record, interviewed the patient and family, and examined the patient. The following aspects are pertinent.  Past Medical History:  Diagnosis Date   Allergy    Arthritis    Carpal tunnel syndrome    Diabetes mellitus without complication (HCC)    Esophageal varices (HCC)    GERD (gastroesophageal reflux disease)    GI bleed    Hyperlipidemia    Hypertension    Pancreas divisum 03/22/2023   Portal hypertensive gastropathy (HCC)    Pre-diabetes    Social History   Socioeconomic History   Marital status: Divorced    Spouse name: Not on file   Number of children: Not on file   Years of education: Not on file   Highest education level: Not on  file  Occupational History   Not on file  Tobacco Use   Smoking status: Former    Current packs/day: 0.00    Types: Cigarettes    Quit date: 12/08/1996    Years since quitting: 26.3   Smokeless tobacco: Never  Vaping Use   Vaping status: Never Used  Substance and Sexual Activity   Alcohol use: Yes    Alcohol/week: 9.0 standard drinks of alcohol    Types: 2 Glasses of wine, 5 Cans of beer, 1 Shots of liquor, 1 Standard drinks or equivalent per week   Drug use: No   Sexual activity: Not Currently  Other Topics Concern   Not on file  Social  History Narrative   Not on file   Social Determinants of Health   Financial Resource Strain: Not on file  Food Insecurity: Food Insecurity Present (03/21/2023)   Hunger Vital Sign    Worried About Running Out of Food in the Last Year: Sometimes true    Ran Out of Food in the Last Year: Sometimes true  Transportation Needs: Unmet Transportation Needs (03/21/2023)   PRAPARE - Administrator, Civil Service (Medical): Yes    Lack of Transportation (Non-Medical): Yes  Physical Activity: Not on file  Stress: Not on file  Social Connections: Unknown (12/09/2021)   Received from [employer], [facility]   Social Network    Social Network: Not on file   Family History  Problem Relation Age of Onset   Early death Mother    Lung cancer Mother 41       lung--smoker   Alcohol abuse Mother    Atrial fibrillation Father    Heart disease Father    COPD Father    Hyperlipidemia Father    Hypertension Father    Arthritis Paternal Grandmother    Cancer Paternal Grandfather    Colon cancer Neg Hx    Esophageal cancer Neg Hx    Stomach cancer Neg Hx    Rectal cancer Neg Hx    Scheduled Meds:  Chlorhexidine Gluconate Cloth  6 each Topical Daily   feeding supplement  237 mL Oral BID BM   lactulose  20 g Oral BID   midodrine  10 mg Oral TID PC   [START ON 03/25/2023] pantoprazole  40 mg Intravenous Q12H   sodium chloride flush  10-40 mL Intracatheter Q12H   sodium chloride flush  3 mL Intravenous Q12H   Continuous Infusions:  sodium chloride 10 mL/hr at 03/24/23 1000   dextrose 40 mL/hr at 03/24/23 1000   piperacillin-tazobactam (ZOSYN)  IV Stopped (03/24/23 0925)   PRN Meds:.guaiFENesin-dextromethorphan, [DISCONTINUED] ondansetron **OR** ondansetron (ZOFRAN) IV, mouth rinse, sodium chloride flush Medications Prior to Admission:  Prior to Admission medications   Medication Sig Start Date End Date Taking? Authorizing Provider  furosemide (LASIX) 40 MG tablet Take 1 tablet  (40 mg total) by mouth 2 (two) times daily at 10 am and 4 pm. 02/20/23 03/22/23  Clayborne Dana, NP  metFORMIN (GLUCOPHAGE) 500 MG tablet Take 500 mg by mouth daily.    [provider]  Multiple Vitamins-Minerals (MULTIVITAMIN WITH MINERALS) tablet Take 1 tablet by mouth daily.    [provider]  pantoprazole (PROTONIX) 40 MG tablet Take 1 tablet (40 mg total) by mouth 2 (two) times daily before a meal. 02/20/23   Clayborne Dana, NP  propranolol (INDERAL) 10 MG tablet Take 1 tablet (10 mg total) by mouth 2 (two) times daily. 01/13/23 04/13/23  Lorin Glass, MD  spironolactone (ALDACTONE) 25 MG tablet Take 1 tablet (25 mg total) by mouth daily. 02/20/23 03/22/23  Clayborne Dana, NP   Allergies  Allergen Reactions   Cephalexin Rash   Doxycycline Rash   Latex Hives and Rash   Review of Systems +weakness  Physical Exam Chronically ill appearing  +scleral icterus   Vital Signs: BP 102/67 (BP Location: Left Arm)   Pulse 84   Temp 99.3 F (37.4 C)   Resp (!) 23   Ht 5\' 8"  (1.727 m)   Wt 83.4 kg   SpO2 96%   BMI 27.96 kg/m  Pain Scale: 0-10   Pain Score: 0-No pain   SpO2: SpO2: 96 % O2 Device:SpO2: 96 % O2 Flow Rate: .   IO: Intake/output summary:  Intake/Output Summary (Last 24 hours) at 03/24/2023 1333 Last data filed at 03/24/2023 1000 Gross per 24 hour  Intake 1229.04 ml  Output 1585 ml  Net -355.96 ml    LBM: Last BM Date : 03/24/23 Baseline Weight: Weight: 95.3 kg Most recent weight: Weight: 83.4 kg     Palliative Assessment/Data:   PPS 50%  Time In:  9 Time Out: 10  Time Total:  60  Greater than 50%  of this time was spent counseling and coordinating care related to the above assessment and plan.  Signed by: Rosalin Hawking, MD   Please contact Palliative Medicine Team phone at [phone] for questions and concerns.  For individual provider: See Loretha Stapler

## 2023-03-24 NOTE — Progress Notes (Signed)
[facility] Progress Note Patient: GILBERTO BARSKI WUJ:[id number] DOB: May 21, 1961 DOA: 03/21/2023  DOS: the patient was seen and examined on 03/24/2023  Brief hospital course: PMH significant of decompensated cirrhosis, anxiety, chronic anemia-iron deficiency, esophageal varices, history of alcohol abuse, refractory ascites.  Present to the hospital with complaints of a possible fall and abdominal distention due to missing paracentesis. Currently being treated for sepsis from SBP, possible bleeding, AKI and encephalopathy. Care transferred to ICU on 8/22.  Was briefly on low-dose levo.  Underwent repeat paracentesis with 4 L removed.  Transfer back to [facility] on 8/23.   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 CP class C. Underwent paracentesis.  Last paracentesis was in August 5 with 12.5 L removed.  Sees Dr. Carrie Mew at [facility]. EGD 10/23 grade 1 and small esophageal varices. TIPS was recommended for the patient outpatient. Was on Cipro daily. Supposed to be on lactulose 30 mL 3 times daily. AFP 6.2 August 24. Paracentesis on 8/21, 10 L removed, paracentesis on 8/22, 4 L removed.  No evidence of hemoperitoneum.  Workup so far negative for SBP. Treated aggressively with IV albumin. GI consulted. On IV Protonix. Currently no plan for EGD.  Will allow full liquid diet. Paracentesis as needed.  Hypothermia, suspected sepsis with SBP Etiology of the hypothermia not clear. Given his presentation with a fall, hypotension as well as multiple electrolyte abnormality and organ damage with AKI will treat as potential sepsis. MRSA PCR negative.  Blood culture growing staph epidermis in anaerobic bottle and gram-positive rods in both bottles. Given his presentation Will continue IV antibiotics, currently on Zosyn. Repeat cultures tomorrow.   Fall at home No  focal deficit. CT head C-spine negative for any acute fracture. Monitor for now.  Acute on chronic iron deficiency anemia due to chronic blood loss Hemoglobin 11 in August. Hemoglobin initially 7.7.  Dropped down to 6.5.  CT abdomen negative for retroperitoneal hemorrhage.  S/P 1 PRBC 8/21. 2 PRBC 8/22. 1 PRBC 8/23. Hematology consulted for concern for hemolysis but suspect most likely presentation with GI bleed. Lactulose enema revealed melanotic stool. Patient was on Protonix drip.  Now on Protonix every 12 hours. Transfuse for hemoglobin less than 7 or hemodynamic instability. Patient provided consent for blood transfusion.   Hyponatremia. Hyperkalemia Acute kidney injury. Prior sodium 131.  On admission 125.  Currently sodium improving. Potassium level improved as well. S creatinine at baseline 0.7.  On admission 1.29 After aggressive treatment with IV albumin and IV blood transfusion serum creatinine back to baseline. Elevated potassium most likely in the setting of ongoing use of Aldactone. Treated with Kayexalate enema.  Hyponatremia mostly in the setting of poor p.o. intake as well as cirrhosis. Monitor daily.  Hypoglycemia. In the setting of poor p.o. intake. Currently on D10. Might worsen hyponatremia. Advancing to clear liquid diet.   Possible hepatic encephalopathy. Ammonia level 62. Asterixis possible. Will initiate lactulose therapy.  Since unable to tolerate p.o. ordered lactulose enema.   Chronic thrombocytopenia. Worsening, likely dilutional in nature. Hematology following. In the setting of alcohol cirrhosis. Monitor for now.   Hypotension Suspected hemorrhagic shock versus distributive shock in the setting of hypoalbuminemia. History of hypertension On midodrine.  Briefly required Levophed. Holding home Inderal. Blood pressure improved.   Hyperlipidemia Holding statin for now.   Umbilical hernia without obstruction and without gangrene Monitor  for now.  Currently no evidence of obstruction.  Goals of care conversation. Palliative care consulted. Patient wanted to be full code at the time of admission.  No HCPOA.  Required emergency consent for PICC line.   Subjective: No abdominal pain.  No nausea no vomiting.  Physical Exam: General: in Mild distress, No Rash Cardiovascular: S1 and S2 Present, No Murmur Respiratory: Good respiratory effort, Bilateral Air entry present. No Crackles, No wheezes Abdomen: Bowel Sound present,\, distended no tenderness Extremities: No edema Neuro: Alert and oriented x3, no new focal deficit  Data Reviewed: I have Reviewed nursing notes, Vitals, and Lab results. Since last encounter, pertinent lab results CBC and BMP   . I have ordered test including CBC and BMP  .   Disposition: Status is: Inpatient Remains inpatient appropriate because: Stability of H&H  SCDs Start: 03/21/23 1819   Family Communication: No one at bedside Level of care: ICU transfer to stepdown. Vitals:   03/24/23 1300 03/24/23 1400 03/24/23 1500 03/24/23 1600  BP:    113/66  Pulse: 86 85  89  Resp: (!) 21 (!) 21 19 19   Temp: 99.1 F (37.3 C) 99 F (37.2 C) 99.1 F (37.3 C) 99.3 F (37.4 C)  TempSrc:    Bladder  SpO2: 98% 93%  94%  Weight:      Height:         Author: Lynden Oxford, MD 03/24/2023 4:53 PM  Please look on [email or web address] to find out who is on call.

## 2023-03-24 NOTE — Progress Notes (Signed)
Pharmacy Antibiotic Note  Nathan Roberts is a 62 y.o. male admitted on 03/21/2023 after a fall. Patient with abdominal distention, last paracentesis a month ago. Reports he missed one two weeks ago. He is s/p paracentesis 8/21 with removal of 10 L fluid. He presents with hypotension and hypothermia concerning for sepsis. Pharmacy has been consulted for vancomycin and Zosyn dosing.  Day 3 Zosyn VSS and labs stable Tmax 100.4 Staph epi and GPR in 1/4 blood culture bottles - presumed contaminants  Plan: -Continue Zosyn 3.375 g IV q8h extended infusion -Continue to follow renal function, cultures and clinical progress for dose adjustments and de-escalation  Height: 5\' 8"  (172.7 cm) Weight: 83.4 kg (183 lb 13.8 oz) IBW/kg (Calculated) : 68.4  Temp (24hrs), Avg:99.6 F (37.6 C), Min:99 F (37.2 C), Max:100.4 F (38 C)  Recent Labs  Lab 03/21/23 1818 03/22/23 0306 03/22/23 0735 03/22/23 0855 03/22/23 1540 03/22/23 2213 03/23/23 0543 03/23/23 0933 03/23/23 1554 03/24/23 0514  WBC 9.5 7.8  --    < > 7.4 6.6 7.7 8.4 8.0 7.2  CREATININE 1.37* 1.25*  --   --  1.01  --  0.77  --   --  0.78  LATICACIDVEN  --  3.1* 2.9*  --   --   --   --   --   --   --    < > = values in this interval not displayed.    Estimated Creatinine Clearance: 98.2 mL/min (by C-G formula based on SCr of 0.78 mg/dL).    Allergies  Allergen Reactions   Cephalexin Rash   Doxycycline Rash   Latex Hives and Rash      Thank you for allowing pharmacy to be a part of this patient's care.  Hessie Knows, PharmD, BCPS Secure Chat if ?s 03/24/2023 10:44 AM

## 2023-03-24 NOTE — Plan of Care (Signed)
  Problem: Fluid Volume: Goal: Ability to maintain a balanced intake and output will improve Outcome: Progressing   Problem: Education: Goal: Knowledge of General Education information will improve Description: Including pain rating scale, medication(s)/side effects and non-pharmacologic comfort measures Outcome: Progressing   Problem: Health Behavior/Discharge Planning: Goal: Ability to manage health-related needs will improve Outcome: Progressing   Problem: Clinical Measurements: Goal: Respiratory complications will improve Outcome: Progressing

## 2023-03-24 NOTE — Progress Notes (Signed)
[facility] Gastroenterology Progress Note  Nathan Roberts 62 y.o. [birth year]   Subjective: "I feel 100% better" Denies abdominal pain/N/V. Melena in flexiseal.  Objective: Vital signs: Vitals:   03/24/23 0800 03/24/23 0832  BP: 102/67   Pulse: 86 84  Resp: (!) 26 (!) 23  Temp: 99.3 F (37.4 C) 99.3 F (37.4 C)  SpO2: 98% 96%    Physical Exam: Gen: jaundice, chronically ill-appearing, thin, lethargic, no acute distress  HEENT: +icteric sclera CV: RRR Chest: CTA B Abd: severe distention, nontender, +BS; umbilical hernia Ext: no edema  Lab Results: Recent Labs    03/22/23 0855 03/22/23 1540 03/23/23 0543 03/24/23 0514  NA  --    < > 131* 132*  K  --    < > 3.6 3.4*  CL  --    < > 101 102  CO2  --    < > 23 24  GLUCOSE  --    < > 105* 136*  BUN  --    < > 34* 23  CREATININE  --    < > 0.77 0.78  CALCIUM  --    < > 9.0 8.5*  MG 2.1  --   --   --   PHOS 2.5  --   --   --    < > = values in this interval not displayed.   Recent Labs    03/23/23 0543 03/24/23 0514  AST 49* 49*  ALT 22 21  ALKPHOS 49 56  BILITOT 5.4* 5.4*  PROT 5.2* 4.9*  ALBUMIN 3.5 3.3*   Recent Labs    03/21/23 1818 03/22/23 0306 03/22/23 0855 03/22/23 1540 03/23/23 1554 03/24/23 0514  WBC 9.5   < > 6.4   < > 8.0 7.2  NEUTROABS 7.4  --  4.5  --   --   --   HGB 6.6*   < > 5.5*   < > 8.9* 9.0*  HCT 20.3*   < > 17.0*   < > 26.1* 26.7*  MCV 104.1*   < > 100.6*   < > 94.9 96.4  PLT 106*   < > 52*   < > 69* 70*   < > = values in this interval not displayed.      Assessment/Plan: Decompensated alcoholic cirrhosis - severe ascites; Paracentesis negative for SBP, INR 2.5 (2.7). Hgb stable. Vitamin K given today. On Lactulose. If becomes short of breath, then will need a repeat large volume paracentesis. Reports no alcohol in the past 5 months. Supportive care. Will follow.   [name] 03/24/2023, 10:40 AM  Questions please call [phone] ID: Nathan Roberts, male    DOB: 1960-10-26, 62 y.o.   MRN: [id number]

## 2023-03-24 NOTE — Plan of Care (Signed)

## 2023-03-25 ENCOUNTER — Inpatient Hospital Stay (HOSPITAL_COMMUNITY): Payer: 59

## 2023-03-25 DIAGNOSIS — K7031 Alcoholic cirrhosis of liver with ascites: Secondary | ICD-10-CM | POA: Diagnosis not present

## 2023-03-25 DIAGNOSIS — A419 Sepsis, unspecified organism: Secondary | ICD-10-CM | POA: Diagnosis present

## 2023-03-25 LAB — COMPREHENSIVE METABOLIC PANEL
ALT: 20 U/L (ref 0–44)
AST: 45 U/L — ABNORMAL HIGH (ref 15–41)
Albumin: 3.1 g/dL — ABNORMAL LOW (ref 3.5–5.0)
Alkaline Phosphatase: 82 U/L (ref 38–126)
Anion gap: 6 (ref 5–15)
BUN: 22 mg/dL (ref 8–23)
CO2: 22 mmol/L (ref 22–32)
Calcium: 8.1 mg/dL — ABNORMAL LOW (ref 8.9–10.3)
Chloride: 98 mmol/L (ref 98–111)
Creatinine, Ser: 0.74 mg/dL (ref 0.61–1.24)
GFR, Estimated: >60 mL/min (ref >60)
Glucose, Bld: 129 mg/dL — ABNORMAL HIGH (ref 70–99)
Potassium: 3.4 mmol/L — ABNORMAL LOW (ref 3.5–5.1)
Sodium: 126 mmol/L — ABNORMAL LOW (ref 135–145)
Total Bilirubin: 4.3 mg/dL — ABNORMAL HIGH (ref 0.3–1.2)
Total Protein: 5.2 g/dL — ABNORMAL LOW (ref 6.5–8.1)

## 2023-03-25 LAB — RESPIRATORY PANEL BY PCR

## 2023-03-25 LAB — URINALYSIS, W/ REFLEX TO CULTURE (INFECTION SUSPECTED)
Bilirubin Urine: NEGATIVE
Glucose, UA: NEGATIVE mg/dL
Ketones, ur: NEGATIVE mg/dL
Leukocytes,Ua: NEGATIVE
Nitrite: NEGATIVE
Protein, ur: 30 mg/dL — AB
RBC / HPF: >50 RBC/hpf (ref 0–5)
Specific Gravity, Urine: 1.03 (ref 1.005–1.030)
pH: 5 (ref 5.0–8.0)

## 2023-03-25 LAB — BASIC METABOLIC PANEL
Anion gap: 9 (ref 5–15)
BUN: 20 mg/dL (ref 8–23)
CO2: 22 mmol/L (ref 22–32)
Calcium: 8.1 mg/dL — ABNORMAL LOW (ref 8.9–10.3)
Chloride: 95 mmol/L — ABNORMAL LOW (ref 98–111)
Creatinine, Ser: 0.68 mg/dL (ref 0.61–1.24)
GFR, Estimated: >60 mL/min (ref >60)
Glucose, Bld: 135 mg/dL — ABNORMAL HIGH (ref 70–99)
Potassium: 3.3 mmol/L — ABNORMAL LOW (ref 3.5–5.1)
Sodium: 126 mmol/L — ABNORMAL LOW (ref 135–145)

## 2023-03-25 LAB — CBC WITH DIFFERENTIAL/PLATELET
Abs Immature Granulocytes: 0.03 10*3/uL (ref 0.00–0.07)
Basophils Absolute: 0 10*3/uL (ref 0.0–0.1)
Basophils Relative: 0 %
Eosinophils Absolute: 0.5 10*3/uL (ref 0.0–0.5)
Eosinophils Relative: 5 %
HCT: 28.8 % — ABNORMAL LOW (ref 39.0–52.0)
Hemoglobin: 9.7 g/dL — ABNORMAL LOW (ref 13.0–17.0)
Immature Granulocytes: 0 %
Lymphocytes Relative: 8 %
Lymphs Abs: 0.8 10*3/uL (ref 0.7–4.0)
MCH: 32.6 pg (ref 26.0–34.0)
MCHC: 33.7 g/dL (ref 30.0–36.0)
MCV: 96.6 fL (ref 80.0–100.0)
Monocytes Absolute: 1.5 10*3/uL — ABNORMAL HIGH (ref 0.1–1.0)
Monocytes Relative: 16 %
Neutro Abs: 6.5 10*3/uL (ref 1.7–7.7)
Neutrophils Relative %: 71 %
Platelets: 73 10*3/uL — ABNORMAL LOW (ref 150–400)
RBC: 2.98 MIL/uL — ABNORMAL LOW (ref 4.22–5.81)
RDW: 18.4 % — ABNORMAL HIGH (ref 11.5–15.5)
WBC: 9.2 10*3/uL (ref 4.0–10.5)
nRBC: 0 % (ref 0.0–0.2)

## 2023-03-25 LAB — GLUCOSE, CAPILLARY
Glucose-Capillary: 115 mg/dL — ABNORMAL HIGH (ref 70–99)
Glucose-Capillary: 122 mg/dL — ABNORMAL HIGH (ref 70–99)
Glucose-Capillary: 123 mg/dL — ABNORMAL HIGH (ref 70–99)
Glucose-Capillary: 129 mg/dL — ABNORMAL HIGH (ref 70–99)
Glucose-Capillary: 130 mg/dL — ABNORMAL HIGH (ref 70–99)
Glucose-Capillary: 138 mg/dL — ABNORMAL HIGH (ref 70–99)
Glucose-Capillary: 140 mg/dL — ABNORMAL HIGH (ref 70–99)

## 2023-03-25 LAB — BODY FLUID CELL COUNT WITH DIFFERENTIAL
Eos, Fluid: 0 %
Lymphs, Fluid: 67 %
Monocyte-Macrophage-Serous Fluid: 26 % — ABNORMAL LOW (ref 50–90)
Neutrophil Count, Fluid: 7 % (ref 0–25)
Total Nucleated Cell Count, Fluid: 148 cu mm (ref 0–1000)

## 2023-03-25 LAB — BODY FLUID CULTURE W GRAM STAIN: Culture: NO GROWTH

## 2023-03-25 LAB — GRAM STAIN: Gram Stain: NONE SEEN

## 2023-03-25 LAB — PROTIME-INR
INR: 2.4 — ABNORMAL HIGH (ref 0.8–1.2)
Prothrombin Time: 26.5 s — ABNORMAL HIGH (ref 11.4–15.2)

## 2023-03-25 LAB — CULTURE, BLOOD (ROUTINE X 2): Special Requests: ADEQUATE

## 2023-03-25 LAB — AMMONIA: Ammonia: 44 umol/L — ABNORMAL HIGH (ref 9–35)

## 2023-03-25 MED ORDER — VANCOMYCIN HCL 1750 MG/350ML IV SOLN
1750.0000 mg | Freq: Once | INTRAVENOUS | Status: DC
  Filled 2023-03-25: qty 350

## 2023-03-25 MED ORDER — ACETAMINOPHEN 500 MG PO TABS
500.0000 mg | ORAL_TABLET | Freq: Once | ORAL | Status: AC
  Administered 2023-03-25: 500 mg via ORAL
  Filled 2023-03-25: qty 1

## 2023-03-25 MED ORDER — POTASSIUM CHLORIDE CRYS ER 20 MEQ PO TBCR
40.0000 meq | EXTENDED_RELEASE_TABLET | ORAL | Status: AC
  Administered 2023-03-25 (×2): 40 meq via ORAL
  Filled 2023-03-25 (×2): qty 2

## 2023-03-25 MED ORDER — ENSURE ENLIVE PO LIQD
237.0000 mL | Freq: Three times a day (TID) | ORAL | Status: DC
  Administered 2023-03-25 – 2023-03-26 (×5): 237 mL via ORAL

## 2023-03-25 MED ORDER — VANCOMYCIN HCL 750 MG/150ML IV SOLN
750.0000 mg | Freq: Two times a day (BID) | INTRAVENOUS | Status: DC
  Filled 2023-03-25: qty 150

## 2023-03-25 MED ORDER — VANCOMYCIN HCL 750 MG/150ML IV SOLN
750.0000 mg | Freq: Two times a day (BID) | INTRAVENOUS | Status: DC
  Administered 2023-03-26 – 2023-03-27 (×3): 750 mg via INTRAVENOUS
  Filled 2023-03-25 (×4): qty 150

## 2023-03-25 MED ORDER — VANCOMYCIN HCL IN DEXTROSE 1-5 GM/200ML-% IV SOLN
1000.0000 mg | Freq: Once | INTRAVENOUS | Status: AC
  Administered 2023-03-25: 1000 mg via INTRAVENOUS
  Filled 2023-03-25: qty 200

## 2023-03-25 MED ORDER — ALBUMIN HUMAN 25 % IV SOLN
50.0000 g | Freq: Four times a day (QID) | INTRAVENOUS | Status: AC
  Administered 2023-03-25 – 2023-03-26 (×3): 50 g via INTRAVENOUS
  Filled 2023-03-25 (×3): qty 200

## 2023-03-25 MED ORDER — LIDOCAINE HCL 1 % IJ SOLN
INTRAMUSCULAR | Status: AC
  Filled 2023-03-25: qty 20

## 2023-03-25 NOTE — Procedures (Signed)
PROCEDURE SUMMARY:  Successful US guided paracentesis from right lateral abdomen.  Yielded 6.3 liters of yellow fluid.  No immediate complications.  Pt tolerated well.   Specimen was sent for labs.  EBL < 5mL  Hoyt Koch PA-C 03/25/2023 2:45 PM

## 2023-03-25 NOTE — Progress Notes (Signed)
[facility] Gastroenterology Progress Note  Nathan Roberts 62 y.o. [birth year]   Subjective: Melena continues in flexiseal per nursing. Denies abdominal pain.   Objective: Vital signs: Vitals:   03/25/23 1000 03/25/23 1100  BP: 102/65   Pulse: 89 91  Resp: 20 (!) 28  Temp: 99 F (37.2 C) 99.1 F (37.3 C)  SpO2: 94% 95%    Physical Exam: Gen: lethargic, chronically ill-appearing, thin, jaundice, no acute distress  HEENT: +icteric sclera CV: RRR Chest: CTA B Abd: severe distention, nontender, +BS, umbilical hernia Ext: no edema  Lab Results: Recent Labs    03/25/23 0537 03/25/23 0942  NA 126* 126*  K 3.4* 3.3*  CL 98 95*  CO2 22 22  GLUCOSE 129* 135*  BUN 22 20  CREATININE 0.74 0.68  CALCIUM 8.1* 8.1*   Recent Labs    03/24/23 0514 03/25/23 0537  AST 49* 45*  ALT 21 20  ALKPHOS 56 82  BILITOT 5.4* 4.3*  PROT 4.9* 5.2*  ALBUMIN 3.3* 3.1*   Recent Labs    03/24/23 0514 03/25/23 0942  WBC 7.2 9.2  NEUTROABS  --  6.5  HGB 9.0* 9.7*  HCT 26.7* 28.8*  MCV 96.4 96.6  PLT 70* 73*      Assessment/Plan: Decompensated alcoholic cirrhosis - would consider repeat large volume paracentesis in next 1-2 days depending on BP. No sign of respiratory compromise with severe ascites. Hgb 9.7. INR 2.4. Low sodium diet. Supportive care. Dr. Jodelle Green to f/u from [facility] tomorrow.   [name] 03/25/2023, 11:33 AM  Questions please call [phone] ID: Nathan Roberts, male   DOB: 1961/06/20, 62 y.o.   MRN: [id number]

## 2023-03-25 NOTE — Progress Notes (Signed)
Pharmacy Antibiotic Note  Nathan Roberts is a 62 y.o. male admitted on 03/21/2023.  Pharmacy has been consulted for Vancomycin dosing   Plan: Vancomycin 1750 mg IV x1 then 1250 mg IV q12h  (SCr rounded 0.8, Vd 0.5, est AUC 442) Measure Vanc levels as needed.  Goal AUC = 400 - 550. Follow up renal function, culture results, and clinical course. Continue Zosyn 3.375g IV Q8H infused over 4hrs.    Height: 5\' 8"  (172.7 cm) Weight: 85.6 kg (188 lb 11.4 oz) IBW/kg (Calculated) : 68.4  Temp (24hrs), Avg:99.7 F (37.6 C), Min:98.1 F (36.7 C), Max:101.3 F (38.5 C)  Recent Labs  Lab 03/22/23 0306 03/22/23 0735 03/22/23 0855 03/22/23 1540 03/22/23 2213 03/23/23 0543 03/23/23 0933 03/23/23 1554 03/24/23 0514 03/25/23 0537 03/25/23 0942  WBC 7.8  --    < > 7.4   < > 7.7 8.4 8.0 7.2  --  9.2  CREATININE 1.25*  --   --  1.01  --  0.77  --   --  0.78 0.74 0.68  LATICACIDVEN 3.1* 2.9*  --   --   --   --   --   --   --   --   --    < > = values in this interval not displayed.    Estimated Creatinine Clearance: 99.4 mL/min (by C-G formula based on SCr of 0.68 mg/dL).    Allergies  Allergen Reactions   Cephalexin Rash   Doxycycline Rash   Latex Hives and Rash    Antimicrobials this admission: 8/21 Zosyn >>  8/21 Vanc x1, resumed 8/25 >>   Dose adjustments this admission:   Microbiology results: 8/21 Covid: neg 8/21 BCx2 1/4 staph epi, 1/4 GPR (new on 8/23) - corynebacterium species 8/22 peritoneal fluid: ngtd 8/22 UA: negative 8/25 repeat BCx: sent 8/25 Resp panel PCR: none detected 8/25 Peritoneal fluid:    Thank you for allowing pharmacy to be a part of this patient's care.  Lynann Beaver PharmD, BCPS [facility] main pharmacy [phone] 03/25/2023 4:56 PM

## 2023-03-25 NOTE — Plan of Care (Signed)
  Problem: Education: Goal: Ability to describe self-care measures that may prevent or decrease complications (Diabetes Survival Skills Education) will improve Outcome: Progressing Goal: Individualized Educational Video(s) Outcome: Progressing   Problem: Coping: Goal: Ability to adjust to condition or change in health will improve Outcome: Progressing   Problem: Fluid Volume: Goal: Ability to maintain a balanced intake and output will improve Outcome: Progressing   Problem: Health Behavior/Discharge Planning: Goal: Ability to identify and utilize available resources and services will improve Outcome: Progressing Goal: Ability to manage health-related needs will improve Outcome: Progressing   Problem: Metabolic: Goal: Ability to maintain appropriate glucose levels will improve Outcome: Progressing   Problem: Nutritional: Goal: Progress toward achieving an optimal weight will improve Outcome: Progressing   Problem: Skin Integrity: Goal: Risk for impaired skin integrity will decrease Outcome: Progressing   Problem: Tissue Perfusion: Goal: Adequacy of tissue perfusion will improve Outcome: Progressing   Problem: Education: Goal: Knowledge of General Education information will improve Description: Including pain rating scale, medication(s)/side effects and non-pharmacologic comfort measures Outcome: Progressing   Problem: Health Behavior/Discharge Planning: Goal: Ability to manage health-related needs will improve Outcome: Progressing   Problem: Clinical Measurements: Goal: Diagnostic test results will improve Outcome: Progressing Goal: Respiratory complications will improve Outcome: Progressing Goal: Cardiovascular complication will be avoided Outcome: Progressing   Problem: Activity: Goal: Risk for activity intolerance will decrease Outcome: Progressing   Problem: Nutrition: Goal: Adequate nutrition will be maintained Outcome: Progressing   Problem:  Coping: Goal: Level of anxiety will decrease Outcome: Progressing   Problem: Elimination: Goal: Will not experience complications related to bowel motility Outcome: Progressing Goal: Will not experience complications related to urinary retention Outcome: Progressing   Problem: Pain Managment: Goal: General experience of comfort will improve Outcome: Progressing   Problem: Safety: Goal: Ability to remain free from injury will improve Outcome: Progressing   Problem: Skin Integrity: Goal: Risk for impaired skin integrity will decrease Outcome: Progressing

## 2023-03-25 NOTE — Progress Notes (Signed)
[facility] Progress Note Patient: Nathan Roberts ZOX:[id number] DOB: [birth year] DOA: 03/21/2023  DOS: the patient was seen and examined on 03/25/2023  Brief hospital course: PMH significant of decompensated cirrhosis, anxiety, chronic anemia-iron deficiency, esophageal varices, history of alcohol abuse, refractory ascites.  Present to the hospital with complaints of a possible fall and abdominal distention due to missing paracentesis. 8/21 underwent paracentesis, 10 L removed admitted for suspected sepsis.,  Received 1 PRBC. 8/22 transferred to ICU, required Levophed, another paracentesis with 4 L removed.  Oncology as well as GI were consulted.  2 PRBC received. 8/23 transfer back to the [facility] service.  1 more PRBC given. 8/24 started to have fever in the evening/night. 8/25 ongoing fever.  Vancomycin added.  Paracentesis with 6.3 L removed.   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 CP class C. Sees Dr. Carrie Mew at [facility]. EGD 10/23 grade 1 and small esophageal varices. TIPS was recommended for the patient outpatient. Was on Cipro daily. Supposed to be on lactulose 30 mL 3 times daily. AFP 6.2 August 24. So far has received 3 paracentesis. Treated aggressively with IV albumin. GI consulted. On IV Protonix. Currently no plan for EGD.  On soft diet. Paracentesis as needed.  Hypothermia, suspected sepsis with SBP Now with FUO. On admission with hypothermia, hypotension, organ damage and electrolyte abnormality treated as a sepsis likely due to SBP. Acetic fluid negative so far for any bacterial growth. Gram stain does not show any evidence of SBP as well. Has been on Zosyn since admission. Hypothermia has resolved.  Now has fever. RVP negative. CT abdomen negative for any other abnormality.  UA unremarkable. MRSA PCR negative.   Blood  culture growing staph epidermis in anaerobic bottle and gram-positive rods in both bottles. Will add IV vancomycin due to ongoing fever. Repeat cultures have been performed on 8/25-follow-up. Check procalcitonin tomorrow. Will repeat chest x-ray.   Fall at home No focal deficit. CT head C-spine negative for any acute fracture. Monitor for now.  Acute on chronic iron deficiency anemia due to chronic blood loss Hemoglobin 11 in August. Hemoglobin initially 7.7.   CT abdomen negative for retroperitoneal hemorrhage.  S/P 1 PRBC 8/21. 2 PRBC 8/22. 1 PRBC 8/23. Hematology consulted for concern for hemolysis but suspect most likely presentation with GI bleed. Lactulose enema revealed melanotic stool. Patient was on Protonix drip.  Now on Protonix every 12 hours. Patient provided consent for blood transfusion.  Transfuse for hemoglobin less than 7 or hemodynamic instability.   Hyponatremia. Hyperkalemia Acute kidney injury. Prior sodium 131.  On admission 125.  Currently sodium improving. Potassium level improved as well. S creatinine at baseline 0.7.  On admission 1.29 After aggressive treatment with IV albumin and blood transfusion serum creatinine back to baseline. Elevated potassium most likely in the setting of ongoing use of Aldactone. Treated with Kayexalate enema.  Hyponatremia mostly in the setting of poor p.o. intake as well as cirrhosis. Monitor daily.  Acute urinary retention. Required In-N-Out catheterization x 2 and later on had a Foley catheter placed. Continue catheter for now.  Hypoglycemia. In the setting of poor p.o. intake. Currently on D10.  On soft diet.  Mild hepatic encephalopathy. Ammonia level elevated. Asterixis present.  Improving. Continue lactulose.   Chronic thrombocytopenia. Worsening, likely dilutional in nature. Hematology following. In the setting of alcohol cirrhosis. Monitor for now.   Hypotension Suspected hemorrhagic shock versus  distributive shock in the setting of hypoalbuminemia. History of hypertension On midodrine.  Briefly required Levophed. Holding home Inderal. Blood pressure improved.   Hyperlipidemia Holding statin for now.   Umbilical hernia without obstruction and without gangrene Monitor for now.  Currently no evidence of obstruction.  Goals of care conversation. Palliative care consulted. Patient wanted to be full code.  No HCPOA.  Required emergency consent for PICC line.   Subjective: Some cough.  No nausea no vomiting no  no chills.  Ongoing fever with dry cough.  Abdomen distended again in the morning.  Loose stool still present.  Physical Exam: General: in Mild distress, No Rash Cardiovascular: S1 and S2 Present, No Murmur Respiratory: Good respiratory effort, Bilateral Air entry present.  Basal crackles, No wheezes Abdomen: Bowel Sound present, distended, no tenderness Extremities: No edema Neuro: Alert and oriented x3, no new focal deficit  Data Reviewed: I have Reviewed nursing notes, Vitals, and Lab results. Since last encounter, pertinent lab results CBC and BMP   . I have ordered test including CBC and CMP  . I have ordered imaging x-ray abdomen  .  Disposition: Status is: Inpatient Remains inpatient appropriate because: Awaiting fever resolution and further workup.  SCDs Start: 03/21/23 1819   Family Communication: No one at bedside Level of care: Stepdown   Vitals:   03/25/23 1400 03/25/23 1500 03/25/23 1600 03/25/23 1627  BP: 107/63 113/67  (!) 101/59  Pulse: 94 100 (!) 101 (!) 101  Resp: (!) 22 (!) 26 (!) 22 (!) 22  Temp: 99.9 F (37.7 C) (!) 100.4 F (38 C) (!) 100.9 F (38.3 C) (!) 101.1 F (38.4 C)  TempSrc:      SpO2: 96% 95% 94% 94%  Weight:      Height:         Author: Lynden Oxford, MD 03/25/2023 4:44 PM  Please look on [email or web address] to find out who is on call.

## 2023-03-26 DIAGNOSIS — K7031 Alcoholic cirrhosis of liver with ascites: Secondary | ICD-10-CM | POA: Diagnosis not present

## 2023-03-26 DIAGNOSIS — K7290 Hepatic failure, unspecified without coma: Secondary | ICD-10-CM | POA: Diagnosis not present

## 2023-03-26 DIAGNOSIS — D539 Nutritional anemia, unspecified: Secondary | ICD-10-CM | POA: Diagnosis not present

## 2023-03-26 LAB — CBC WITH DIFFERENTIAL/PLATELET
Abs Immature Granulocytes: 0.06 10*3/uL (ref 0.00–0.07)
Basophils Absolute: 0.1 10*3/uL (ref 0.0–0.1)
Basophils Relative: 1 %
Eosinophils Absolute: 0.5 10*3/uL (ref 0.0–0.5)
Eosinophils Relative: 4 %
HCT: 27.2 % — ABNORMAL LOW (ref 39.0–52.0)
Hemoglobin: 9.1 g/dL — ABNORMAL LOW (ref 13.0–17.0)
Immature Granulocytes: 1 %
Lymphocytes Relative: 7 %
Lymphs Abs: 0.8 10*3/uL (ref 0.7–4.0)
MCH: 32 pg (ref 26.0–34.0)
MCHC: 33.5 g/dL (ref 30.0–36.0)
MCV: 95.8 fL (ref 80.0–100.0)
Monocytes Absolute: 1.7 10*3/uL — ABNORMAL HIGH (ref 0.1–1.0)
Monocytes Relative: 17 %
Neutro Abs: 7.4 10*3/uL (ref 1.7–7.7)
Neutrophils Relative %: 70 %
Platelets: 69 10*3/uL — ABNORMAL LOW (ref 150–400)
RBC: 2.84 MIL/uL — ABNORMAL LOW (ref 4.22–5.81)
RDW: 18 % — ABNORMAL HIGH (ref 11.5–15.5)
WBC: 10.4 10*3/uL (ref 4.0–10.5)
nRBC: 0 % (ref 0.0–0.2)

## 2023-03-26 LAB — BPAM RBC
Blood Product Expiration Date: 202409242359
Blood Product Expiration Date: 202409242359
Blood Product Expiration Date: 202409262359
Blood Product Expiration Date: 202409302359
Blood Product Expiration Date: 202409302359
ISSUE DATE / TIME: 202408212053
ISSUE DATE / TIME: 202408220007
ISSUE DATE / TIME: 202408221058
ISSUE DATE / TIME: 202408230055
ISSUE DATE / TIME: 202408252019
Unit Type and Rh: 9500
Unit Type and Rh: 9500
Unit Type and Rh: 9500
Unit Type and Rh: 9500
Unit Type and Rh: 9500

## 2023-03-26 LAB — COMPREHENSIVE METABOLIC PANEL
ALT: 16 U/L (ref 0–44)
AST: 35 U/L (ref 15–41)
Albumin: 3.7 g/dL (ref 3.5–5.0)
Alkaline Phosphatase: 84 U/L (ref 38–126)
Anion gap: 6 (ref 5–15)
BUN: 20 mg/dL (ref 8–23)
CO2: 22 mmol/L (ref 22–32)
Calcium: 8.3 mg/dL — ABNORMAL LOW (ref 8.9–10.3)
Chloride: 98 mmol/L (ref 98–111)
Creatinine, Ser: 0.61 mg/dL (ref 0.61–1.24)
GFR, Estimated: >60 mL/min (ref >60)
Glucose, Bld: 123 mg/dL — ABNORMAL HIGH (ref 70–99)
Potassium: 3.8 mmol/L (ref 3.5–5.1)
Sodium: 126 mmol/L — ABNORMAL LOW (ref 135–145)
Total Bilirubin: 3.6 mg/dL — ABNORMAL HIGH (ref 0.3–1.2)
Total Protein: 5.4 g/dL — ABNORMAL LOW (ref 6.5–8.1)

## 2023-03-26 LAB — GLUCOSE, CAPILLARY
Glucose-Capillary: 135 mg/dL — ABNORMAL HIGH (ref 70–99)
Glucose-Capillary: 104 mg/dL — ABNORMAL HIGH (ref 70–99)
Glucose-Capillary: 115 mg/dL — ABNORMAL HIGH (ref 70–99)
Glucose-Capillary: 171 mg/dL — ABNORMAL HIGH (ref 70–99)
Glucose-Capillary: 135 mg/dL — ABNORMAL HIGH (ref 70–99)
Glucose-Capillary: 142 mg/dL — ABNORMAL HIGH (ref 70–99)
Glucose-Capillary: 138 mg/dL — ABNORMAL HIGH (ref 70–99)

## 2023-03-26 LAB — TYPE AND SCREEN
ABO/RH(D): O NEG
Antibody Screen: NEGATIVE
Unit division: 0
Unit division: 0
Unit division: 0
Unit division: 0
Unit division: 0

## 2023-03-26 LAB — PROCALCITONIN: Procalcitonin: 0.64 ng/mL

## 2023-03-26 LAB — MAGNESIUM: Magnesium: 2.3 mg/dL (ref 1.7–2.4)

## 2023-03-26 MED ORDER — SIMETHICONE 80 MG PO CHEW
80.0000 mg | CHEWABLE_TABLET | Freq: Four times a day (QID) | ORAL | Status: DC
  Administered 2023-03-26 – 2023-03-30 (×16): 80 mg via ORAL
  Filled 2023-03-26 (×17): qty 1

## 2023-03-26 MED ORDER — LACTULOSE 10 GM/15ML PO SOLN
20.0000 g | Freq: Three times a day (TID) | ORAL | Status: DC
  Administered 2023-03-26 – 2023-03-29 (×11): 20 g via ORAL
  Filled 2023-03-26 (×12): qty 30

## 2023-03-26 MED ORDER — AMOXICILLIN-POT CLAVULANATE 875-125 MG PO TABS
1.0000 | ORAL_TABLET | Freq: Two times a day (BID) | ORAL | Status: AC
  Administered 2023-03-26 – 2023-03-28 (×4): 1 via ORAL
  Filled 2023-03-26 (×4): qty 1

## 2023-03-26 NOTE — Progress Notes (Signed)
[facility] Progress Note  SUBJECTIVE:   Interval history: Nathan Roberts was seen and evaluated today at bedside. Resting comfortably in bed. Denied abdominal pain, nausea, vomiting, chest pain, shortness of breath. Still with dark stool in flexiseal rectal tube, nursing reported roughly 50-60 cc stool output today which is decreased from yesterday. Infection workup is ongoing. Febrile overnight.  Past Medical History:  Diagnosis Date   Allergy    Arthritis    Carpal tunnel syndrome    Diabetes mellitus without complication (HCC)    Esophageal varices (HCC)    GERD (gastroesophageal reflux disease)    GI bleed    Hyperlipidemia    Hypertension    Pancreas divisum 03/22/2023   Portal hypertensive gastropathy (HCC)    Pre-diabetes    Past Surgical History:  Procedure Laterality Date   abscess     rectal area   CARPAL TUNNEL RELEASE     left   CARPAL TUNNEL RELEASE Right 07/07/2021   Procedure: RIGHT CARPAL TUNNEL RELEASE;  Surgeon: Cindee Salt, MD;  Location: [facility];  Service: Orthopedics;  Laterality: Right;   CERVICAL FUSION     COLONOSCOPY WITH PROPOFOL N/A 05/29/2022   Procedure: COLONOSCOPY WITH PROPOFOL;  Surgeon: Napoleon Form, MD;  Location: [facility];  Service: Gastroenterology;  Laterality: N/A;   ESOPHAGEAL BANDING  10/07/2020   Procedure: ESOPHAGEAL BANDING;  Surgeon: Napoleon Form, MD;  Location: [facility];  Service: Endoscopy;;   ESOPHAGOGASTRODUODENOSCOPY (EGD) WITH PROPOFOL N/A 10/07/2020   Procedure: ESOPHAGOGASTRODUODENOSCOPY (EGD) WITH PROPOFOL;  Surgeon: Napoleon Form, MD;  Location: [facility];  Service: Endoscopy;  Laterality: N/A;   ESOPHAGOGASTRODUODENOSCOPY (EGD) WITH PROPOFOL N/A 01/18/2021   Procedure: ESOPHAGOGASTRODUODENOSCOPY (EGD) WITH PROPOFOL;  Surgeon: Napoleon Form, MD;  Location: [facility];  Service: Endoscopy;  Laterality: N/A;   ESOPHAGOGASTRODUODENOSCOPY (EGD) WITH PROPOFOL N/A  05/29/2022   Procedure: ESOPHAGOGASTRODUODENOSCOPY (EGD) WITH PROPOFOL;  Surgeon: Napoleon Form, MD;  Location: [facility];  Service: Gastroenterology;  Laterality: N/A;   FASCIECTOMY Left 02/10/2021   Procedure: FASCIECTOMY RING FINGER AND SMALL FINGER OF LEFT HAND;  Surgeon: Cindee Salt, MD;  Location: [facility];  Service: Orthopedics;  Laterality: Left;   FASCIECTOMY Right 07/07/2021   Procedure: FASCIECTOMY RIGHT 1ST WEB, RIGHT RING FINGER, RIGHT SMALL FINGER;  Surgeon: Cindee Salt, MD;  Location: [facility];  Service: Orthopedics;  Laterality: Right;   SPINE SURGERY     Current Facility-Administered Medications  Medication Dose Route Frequency Provider Last Rate Last Admin   0.9 %  sodium chloride infusion  250 mL Intravenous Continuous Norton Blizzard, NP   Stopped at 03/25/23 0227   Chlorhexidine Gluconate Cloth 2 % PADS 6 each  6 each Topical Daily Rolly Salter, MD   6 each at 03/25/23 1200   dextrose 10 % infusion   Intravenous Continuous Rolly Salter, MD 20 mL/hr at 03/26/23 1237 New Bag at 03/26/23 1237   feeding supplement (ENSURE ENLIVE / ENSURE PLUS) liquid 237 mL  237 mL Oral TID BM Rolly Salter, MD   237 mL at 03/26/23 1005   guaiFENesin-dextromethorphan (ROBITUSSIN DM) 100-10 MG/5ML syrup 5 mL  5 mL Oral Q4H PRN Rolly Salter, MD   5 mL at 03/23/23 1339   lactulose (CHRONULAC) 10 GM/15ML solution 20 g  20 g Oral TID Rolly Salter, MD       midodrine (PROAMATINE) tablet 10 mg  10 mg Oral TID PC Lynden Oxford  M, MD   10 mg at 03/26/23 1004   ondansetron (ZOFRAN) injection 4 mg  4 mg Intravenous Q6H PRN Rolly Salter, MD       Oral care mouth rinse  15 mL Mouth Rinse PRN Luciano Cutter, MD   15 mL at 03/23/23 0000   pantoprazole (PROTONIX) injection 40 mg  40 mg Intravenous Q12H Rolly Salter, MD   40 mg at 03/26/23 1004   piperacillin-tazobactam (ZOSYN) IVPB 3.375 g  3.375 g Intravenous Q8H [place], Leane Para, RPH   Stopped  at 03/26/23 0957   simethicone (MYLICON) chewable tablet 80 mg  80 mg Oral QID Rolly Salter, MD   80 mg at 03/26/23 1230   sodium chloride flush (NS) 0.9 % injection 10-40 mL  10-40 mL Intracatheter Q12H Luciano Cutter, MD   10 mL at 03/26/23 1005   sodium chloride flush (NS) 0.9 % injection 10-40 mL  10-40 mL Intracatheter PRN Luciano Cutter, MD       sodium chloride flush (NS) 0.9 % injection 3 mL  3 mL Intravenous Q12H Rolly Salter, MD   3 mL at 03/26/23 1004   vancomycin (VANCOREADY) IVPB 1750 mg/350 mL  1,750 mg Intravenous Once Winfield Rast, RPH 175 mL/hr at 03/25/23 1900 Infusion Verify at 03/25/23 1900   vancomycin (VANCOREADY) IVPB 750 mg/150 mL  750 mg Intravenous Q12H Winfield Rast, RPH   Stopped at 03/26/23 1108   Allergies as of 03/21/2023 - Review Complete 03/21/2023  Allergen Reaction Noted   Cephalexin Rash 08/12/2017   Doxycycline Rash 10/06/2020   Latex Hives and Rash 11/01/2011   Review of Systems:  Review of Systems  Constitutional:  Negative for chills and fever.  Respiratory:  Negative for shortness of breath.   Cardiovascular:  Negative for chest pain.  Gastrointestinal:  Negative for abdominal pain, nausea and vomiting.    OBJECTIVE:   Temp:  [98.4 F (36.9 C)-101.5 F (38.6 C)] 98.4 F (36.9 C) (08/26 1100) Pulse Rate:  [59-105] 84 (08/26 1100) Resp:  [21-32] 29 (08/26 1100) BP: (80-126)/(39-71) 99/45 (08/26 1100) SpO2:  [74 %-99 %] 94 % (08/26 1100) Weight:  [83.9 kg] 83.9 kg (08/26 0432) Last BM Date : 03/26/23 Physical Exam Constitutional:      General: He is not in acute distress.    Appearance: He is ill-appearing. He is not toxic-appearing or diaphoretic.  Cardiovascular:     Rate and Rhythm: Normal rate and regular rhythm.  Pulmonary:     Effort: No respiratory distress.     Breath sounds: Normal breath sounds.     Comments: No supplemental oxygen in place at time of exam. Abdominal:     General: Bowel sounds are  normal. There is distension.     Palpations: Abdomen is soft.     Tenderness: There is no abdominal tenderness. There is no guarding.  Skin:    General: Skin is warm and dry.  Neurological:     Mental Status: He is alert.     Labs: Recent Labs    03/24/23 0514 03/25/23 0942 03/26/23 0443  WBC 7.2 9.2 10.4  HGB 9.0* 9.7* 9.1*  HCT 26.7* 28.8* 27.2*  PLT 70* 73* 69*   BMET Recent Labs    03/25/23 0537 03/25/23 0942 03/26/23 0443  NA 126* 126* 126*  K 3.4* 3.3* 3.8  CL 98 95* 98  CO2 22 22 22   GLUCOSE 129* 135* 123*  BUN 22 20 20  CREATININE 0.74 0.68 0.61  CALCIUM 8.1* 8.1* 8.3*   LFT Recent Labs    03/26/23 0443  PROT 5.4*  ALBUMIN 3.7  AST 35  ALT 16  ALKPHOS 84  BILITOT 3.6*   PT/INR Recent Labs    03/24/23 0514 03/25/23 0537  LABPROT 26.9* 26.5*  INR 2.5* 2.4*   Diagnostic imaging: DG CHEST PORT 1 VIEW  Result Date: 03/25/2023 CLINICAL DATA:  Shortness of breath, history of recent paracentesis EXAM: PORTABLE CHEST 1 VIEW COMPARISON:  03/21/2023 FINDINGS: The cardiac shadow is stable. Overall inspiratory effort is poor. Right PICC is noted with catheter tip at the cavoatrial junction. Patchy atelectatic changes are noted in the right lung new from the prior study. No effusion is seen. IMPRESSION: Mild atelectatic changes in the right lung new from the prior exam. Electronically Signed   By: Alcide Clever M.D.   On: 03/25/2023 20:21   US Paracentesis  Result Date: 03/25/2023 INDICATION: 62 year old male with recurrent ascites. Request made for diagnostic and therapeutic paracentesis. EXAM: ULTRASOUND GUIDED DIAGNOSTIC AND THERAPEUTIC PARACENTESIS MEDICATIONS: 10 mL 1% lidocaine COMPLICATIONS: None immediate. PROCEDURE: Informed written consent was obtained from the patient after a discussion of the risks, benefits and alternatives to treatment. A timeout was performed prior to the initiation of the procedure. Initial ultrasound scanning demonstrates a large  amount of ascites within the right lower abdominal quadrant. The right lower abdomen was prepped and draped in the usual sterile fashion. 1% lidocaine was used for local anesthesia. Following this, a 19 gauge, 7-cm, Yueh catheter was introduced. An ultrasound image was saved for documentation purposes. The paracentesis was performed. The catheter was removed and a dressing was applied. The patient tolerated the procedure well without immediate post procedural complication. FINDINGS: A total of approximately 6.3 liters of yellow fluid was removed. Samples were sent to the laboratory as requested by the clinical team. IMPRESSION: Successful ultrasound-guided paracentesis yielding 6.3 liters of peritoneal fluid. Performed by: Loyce Dys PA-C Electronically Signed   By: Judie Petit.  Shick M.D.   On: 03/25/2023 16:27    IMPRESSION: Normocytic anemia Thrombocytopenia Concern for melena (appears more dark brown on my evaluation), Hgb stable SIRS (febrile, tachypnea)  -Blood cultures 03/25/23 negative for growth x 1 day  -CXR 8/25 with mild atelectatic changes in R lung  EtOH cirrhosis decompensated by esophageal varices and treatment resistant ascites  -Last EGD 04/2022 with small varices   -Paracentesis 03/25/23 culture no growth to date, 03/21/23 culture no growth  -Previously recommended to have outpatient TIPs evaluation, would not recommend in setting of possible infection Coagulopathy  -INR 2.4, has received vitamin K 4 mg on 03/23/23, vitamin K 10 mg on 03/21/23  PLAN: -Very small rectal tube output, appears more dark brown on my evaluation, no plan for EGD at this time -Trend H/H, transfuse for Hgb < 7  -Your IM workup of infectious etiology -May need repeat paracentesis pending clinical picture, does not appear source of infection -Low sodium diet  -[facility] will follow   LOS: 5 days   Liliane Shi, [facility]

## 2023-03-26 NOTE — Evaluation (Signed)
Occupational Therapy Evaluation Patient Details Name: Nathan Roberts MRN: [id number] DOB: 08-Dec-1960 Today's Date: 03/26/2023   History of Present Illness Patient is a 62 year old male who presented on 8/21 with abdominal distension and possible fall. Patient underwent paracentesis on 8/21 with 10L removed. Patient was admitted with decompensated hepatic cirrhosis, ascites, hypothermia, sepsis, acute on chronic iron deficiency anemia due to chronic blood loss. 8/22 patient was transitioned to SDU with another paracentesis completed removing 4L. 8/25 patient had another paracentesis with 6.3L removed. PMH: hypoglycemia, umbilical hernia, hyperlipidemia, decompensated cirrhosis, anxiety, esophageal varices, alcohol abuse.   Clinical Impression    Patient is a 62 year old male who was admitted for above. Patient reported living at home alone with no AD. Currently, patient is easily fatigued with transfer to recliner with mod A for bed mobility and max A for LB dressing and hygiene tasks. Patient was noted to have decreased functional activity tolerance, decreased endurance, decreased standing balance, decreased safety awareness, and decreased knowledge of AD/AE impacting participation in ADLs. Patient would continue to benefit from skilled OT services at this time while admitted and after d/c to address noted deficits in order to improve overall safety and independence in ADLs. Patient will benefit from continued inpatient follow up therapy, <3 hours/day        If plan is discharge home, recommend the following: A lot of help with bathing/dressing/bathroom;Assistance with cooking/housework;Direct supervision/assist for medications management;Assist for transportation;Help with stairs or ramp for entrance;Direct supervision/assist for financial management;A lot of help with walking and/or transfers    Functional Status Assessment  Patient has had a recent decline in their functional status and  demonstrates the ability to make significant improvements in function in a reasonable and predictable amount of time.  Equipment Recommendations  None recommended by OT       Precautions / Restrictions Restrictions Weight Bearing Restrictions: No      Mobility Bed Mobility Overal bed mobility: Needs Assistance Bed Mobility: Supine to Sit     Supine to sit: Mod assist     General bed mobility comments: with cues for log rolling               Balance Overall balance assessment: Mild deficits observed, not formally tested                 ADL either performed or assessed with clinical judgement   ADL Overall ADL's : Needs assistance/impaired Eating/Feeding: Supervision/ safety   Grooming: Sitting;Supervision/safety   Upper Body Bathing: Sitting;Minimal assistance   Lower Body Bathing: Maximal assistance;Sitting/lateral leans;Sit to/from stand Lower Body Bathing Details (indicate cue type and reason): cannot reach feet with how distended patients belly currently is. Upper Body Dressing : Minimal assistance;Sitting   Lower Body Dressing: Maximal assistance;Sitting/lateral leans;Sit to/from stand   Toilet Transfer: Minimal assistance Toilet Transfer Details (indicate cue type and reason): HHA to transfer from EOB to recliner in room with increased time and cues for proper positioning. Toileting- Clothing Manipulation and Hygiene: Maximal assistance;Sit to/from stand Toileting - Clothing Manipulation Details (indicate cue type and reason): unable to maintain balance and reach with current pain in belly.             Vision   Vision Assessment?: No apparent visual deficits            Pertinent Vitals/Pain Pain Assessment Pain Assessment: Faces Faces Pain Scale: Hurts a little bit Pain Location: bottom from flexiseal Pain Descriptors / Indicators: Discomfort Pain Intervention(s): Monitored during  session, Repositioned     Extremity/Trunk Assessment  Upper Extremity Assessment Upper Extremity Assessment: Generalized weakness;Difficult to assess due to impaired cognition   Lower Extremity Assessment Lower Extremity Assessment: Defer to PT evaluation          Cognition Arousal: Alert Behavior During Therapy: [facility] for tasks assessed/performed Overall Cognitive Status: Difficult to assess           General Comments: slow to respond at times. patient noted to repeat questions during session with appearing to have little awareness of this.                Home Living Family/patient expects to be discharged to:: Private residence Living Arrangements: Alone Available Help at Discharge: Neighbor   Home Access: Level entry     Home Layout: Two level;Bed/bath upstairs     Bathroom Shower/Tub: Chief Strategy Officer: Standard                Prior Functioning/Environment Prior Level of Function : Independent/Modified Independent                        OT Problem List: Decreased activity tolerance;Impaired balance (sitting and/or standing);Decreased coordination;Decreased safety awareness;Decreased knowledge of precautions;Decreased knowledge of use of DME or AE      OT Treatment/Interventions: Self-care/ADL training;DME and/or AE instruction;Therapeutic exercise;Manual therapy;Patient/family education;Balance training    OT Goals(Current goals can be found in the care plan section) Acute Rehab OT Goals Patient Stated Goal: to go home OT Goal Formulation: Patient unable to participate in goal setting Time For Goal Achievement: 04/09/23 Potential to Achieve Goals: Fair  OT Frequency: Min 1X/week       AM-PAC OT "6 Clicks" Daily Activity     Outcome Measure Help from another person eating meals?: A Little Help from another person taking care of personal grooming?: A Little Help from another person toileting, which includes using toliet, bedpan, or urinal?: A Lot Help from another person  bathing (including washing, rinsing, drying)?: A Lot Help from another person to put on and taking off regular upper body clothing?: A Little Help from another person to put on and taking off regular lower body clothing?: A Lot 6 Click Score: 15   End of Session    Activity Tolerance: Patient limited by fatigue Patient left: in chair;with call bell/phone within reach;with chair alarm set;with nursing/sitter in room  OT Visit Diagnosis: Unsteadiness on feet (R26.81);Other abnormalities of gait and mobility (R26.89);Muscle weakness (generalized) (M62.81)                Time: 0981-1914 OT Time Calculation (min): 29 min Charges:  OT General Charges $OT Visit: 1 Visit OT Evaluation $OT Eval Low Complexity: 1 Low OT Treatments $Self Care/Home Management : 8-22 mins  Rosalio Loud, MS Acute Rehabilitation Department Office# [phone]   Selinda Flavin 03/26/2023, 4:24 PM

## 2023-03-26 NOTE — Progress Notes (Signed)
[facility] Progress Note Patient: Nathan Roberts ZOX:[id number] DOB: [birth year] DOA: 03/21/2023  DOS: the patient was seen and examined on 03/26/2023  Brief hospital course: PMH significant of decompensated cirrhosis, anxiety, chronic anemia-iron deficiency, esophageal varices, history of alcohol abuse, refractory ascites.  Present to the hospital with complaints of a possible fall and abdominal distention due to missing paracentesis. 8/21 underwent paracentesis, 10 L removed admitted for suspected sepsis.,  Received 1 PRBC. 8/22 transferred to ICU, required Levophed, another paracentesis with 4 L removed.  Oncology as well as GI were consulted.  2 PRBC received. 8/23 transfer back to the [facility] service.  1 more PRBC given. 8/24 started to have fever in the evening/night. 8/25 ongoing fever.  Vancomycin added.  Paracentesis with 6.3 L removed.   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 CP class C. Sees Dr. Carrie Mew at [facility]. EGD 10/23 grade 1 and small esophageal varices. TIPS was recommended for the patient outpatient. Was on Cipro daily. Supposed to be on lactulose 30 mL 3 times daily. AFP 6.2 August 24. So far has received 3 paracentesis. Treated aggressively with IV albumin. GI consulted. On IV Protonix. Currently no plan for EGD.  On soft diet. Paracentesis as needed.  Hypothermia, suspected sepsis with SBP Now with FUO. On admission with hypothermia, hypotension, organ damage and electrolyte abnormality treated as a sepsis likely due to SBP. Acetic fluid negative so far for any bacterial growth. Gram stain does not show any evidence of SBP as well. Has been on Zosyn since admission. Hypothermia has resolved.  Now has fever. RVP negative. CT abdomen negative for any other abnormality.  UA unremarkable. MRSA PCR negative.   Blood  culture growing staph epidermis in anaerobic bottle and gram-positive rods in both bottles. Will add IV vancomycin due to ongoing fever. Repeat cultures have been performed on 8/25-follow-up. Chest x-ray unremarkable.  Pro-Cal minimally elevated.  Switching to Augmentin from Zosyn.  Has been chronically on Cipro.   Fall at home No focal deficit. CT head C-spine negative for any acute fracture. Monitor for now.  Acute on chronic iron deficiency anemia due to chronic blood loss Hemoglobin 11 in August. Hemoglobin initially 7.7.   CT abdomen negative for retroperitoneal hemorrhage.  S/P 1 PRBC 8/21. 2 PRBC 8/22. 1 PRBC 8/23. Hematology consulted for concern for hemolysis but suspect most likely presentation with GI bleed. Lactulose enema revealed melanotic stool. Patient was on Protonix drip.  Now on Protonix every 12 hours. Patient provided consent for blood transfusion.  Transfuse for hemoglobin less than 7 or hemodynamic instability.   Hyponatremia. Hyperkalemia Acute kidney injury. Prior sodium 131.  On admission 125.  Currently sodium improving. Potassium level improved as well. S creatinine at baseline 0.7.  On admission 1.29 After aggressive treatment with IV albumin and blood transfusion serum creatinine back to baseline. Elevated potassium most likely in the setting of ongoing use of Aldactone. Treated with Kayexalate enema.  Hyponatremia mostly in the setting of poor p.o. intake as well as cirrhosis. Monitor daily.  Acute urinary retention. Required In-N-Out catheterization x 2 and later on had a Foley catheter placed. Unable to initiate Flomax due to hypotension Given that his thrombocytopenia and risk of bleeding, voiding trial becomes difficult and too risky.  Will continue catheter for now.  Hypoglycemia. In the setting of poor p.o. intake. Currently on D10.  On soft diet.  Mild hepatic encephalopathy.  Ammonia level elevated. Asterixis present.   Improving. Continue lactulose.   Chronic thrombocytopenia. Worsening, likely dilutional in nature. Hematology following. In the setting of alcohol cirrhosis. Monitor for now.   Hypotension Suspected hemorrhagic shock versus distributive shock in the setting of hypoalbuminemia. History of hypertension On midodrine.  Briefly required Levophed. Holding home Inderal. Blood pressure improved.   Hyperlipidemia Holding statin for now.   Umbilical hernia without obstruction and without gangrene Monitor for now.  Currently no evidence of obstruction.  Goals of care conversation. Palliative care consulted. Patient wanted to be full code.  No HCPOA.  Required emergency consent for PICC line.   Subjective: No abdominal pain.  No further fever.  No nausea no vomiting.  Physical Exam: General: in Mild distress, No Rash Cardiovascular: S1 and S2 Present, No Murmur Respiratory: Good respiratory effort, Bilateral Air entry present. No Crackles, No wheezes Abdomen: Bowel Sound present, severely distended again, no tenderness Extremities: No edema Neuro: Alert and oriented x3, no new focal deficit  Data Reviewed: I have Reviewed nursing notes, Vitals, and Lab results. Since last encounter, pertinent lab results CBC and BMP   . I have ordered test including CBC and CMP  .   Disposition: Status is: Inpatient Remains inpatient appropriate because: Critically ill.  Requiring close monitoring.  SCDs Start: 03/21/23 1819   Family Communication: No one at bedside Level of care: Stepdown   Vitals:   03/26/23 1500 03/26/23 1600 03/26/23 1637 03/26/23 1700  BP: (!) 110/58  127/69 104/62  Pulse: 88 82 87 85  Resp: 19 (!) 24 (!) 28 (!) 27  Temp: 98.8 F (37.1 C) 98.6 F (37 C) 98.6 F (37 C) 98.6 F (37 C)  TempSrc:      SpO2: 93% 97% 94% 92%  Weight:      Height:         Author: Lynden Oxford, MD 03/26/2023 6:34 PM  Please look on [email or web address] to find out who is on call.

## 2023-03-27 DIAGNOSIS — K7031 Alcoholic cirrhosis of liver with ascites: Secondary | ICD-10-CM | POA: Diagnosis not present

## 2023-03-27 DIAGNOSIS — D539 Nutritional anemia, unspecified: Secondary | ICD-10-CM | POA: Diagnosis not present

## 2023-03-27 DIAGNOSIS — E43 Unspecified severe protein-calorie malnutrition: Secondary | ICD-10-CM | POA: Insufficient documentation

## 2023-03-27 DIAGNOSIS — K7290 Hepatic failure, unspecified without coma: Secondary | ICD-10-CM | POA: Diagnosis not present

## 2023-03-27 LAB — CBC WITH DIFFERENTIAL/PLATELET
Abs Immature Granulocytes: 0.05 10*3/uL (ref 0.00–0.07)
Basophils Absolute: 0.1 10*3/uL (ref 0.0–0.1)
Basophils Relative: 1 %
Eosinophils Absolute: 0.8 10*3/uL — ABNORMAL HIGH (ref 0.0–0.5)
Eosinophils Relative: 7 %
HCT: 26.7 % — ABNORMAL LOW (ref 39.0–52.0)
Hemoglobin: 9 g/dL — ABNORMAL LOW (ref 13.0–17.0)
Immature Granulocytes: 1 %
Lymphocytes Relative: 9 %
Lymphs Abs: 0.9 10*3/uL (ref 0.7–4.0)
MCH: 32.4 pg (ref 26.0–34.0)
MCHC: 33.7 g/dL (ref 30.0–36.0)
MCV: 96 fL (ref 80.0–100.0)
Monocytes Absolute: 1.7 10*3/uL — ABNORMAL HIGH (ref 0.1–1.0)
Monocytes Relative: 16 %
Neutro Abs: 7 10*3/uL (ref 1.7–7.7)
Neutrophils Relative %: 66 %
Platelets: 84 10*3/uL — ABNORMAL LOW (ref 150–400)
RBC: 2.78 MIL/uL — ABNORMAL LOW (ref 4.22–5.81)
RDW: 18 % — ABNORMAL HIGH (ref 11.5–15.5)
WBC: 10.5 10*3/uL (ref 4.0–10.5)
nRBC: 0 % (ref 0.0–0.2)

## 2023-03-27 LAB — COMPREHENSIVE METABOLIC PANEL
ALT: 17 U/L (ref 0–44)
AST: 40 U/L (ref 15–41)
Albumin: 3.5 g/dL (ref 3.5–5.0)
Alkaline Phosphatase: 87 U/L (ref 38–126)
Anion gap: 4 — ABNORMAL LOW (ref 5–15)
BUN: 23 mg/dL (ref 8–23)
CO2: 22 mmol/L (ref 22–32)
Calcium: 8.2 mg/dL — ABNORMAL LOW (ref 8.9–10.3)
Chloride: 98 mmol/L (ref 98–111)
Creatinine, Ser: 0.61 mg/dL (ref 0.61–1.24)
GFR, Estimated: >60 mL/min (ref >60)
Glucose, Bld: 123 mg/dL — ABNORMAL HIGH (ref 70–99)
Potassium: 3.9 mmol/L (ref 3.5–5.1)
Sodium: 124 mmol/L — ABNORMAL LOW (ref 135–145)
Total Bilirubin: 4 mg/dL — ABNORMAL HIGH (ref 0.3–1.2)
Total Protein: 5.3 g/dL — ABNORMAL LOW (ref 6.5–8.1)

## 2023-03-27 LAB — GLUCOSE, CAPILLARY
Glucose-Capillary: 104 mg/dL — ABNORMAL HIGH (ref 70–99)
Glucose-Capillary: 117 mg/dL — ABNORMAL HIGH (ref 70–99)
Glucose-Capillary: 127 mg/dL — ABNORMAL HIGH (ref 70–99)
Glucose-Capillary: 131 mg/dL — ABNORMAL HIGH (ref 70–99)
Glucose-Capillary: 132 mg/dL — ABNORMAL HIGH (ref 70–99)
Glucose-Capillary: 95 mg/dL (ref 70–99)

## 2023-03-27 LAB — MAGNESIUM: Magnesium: 2.5 mg/dL — ABNORMAL HIGH (ref 1.7–2.4)

## 2023-03-27 MED ORDER — ADULT MULTIVITAMIN W/MINERALS CH
1.0000 | ORAL_TABLET | Freq: Every day | ORAL | Status: DC
  Administered 2023-03-28 – 2023-03-30 (×3): 1 via ORAL
  Filled 2023-03-27 (×3): qty 1

## 2023-03-27 MED ORDER — BOOST / RESOURCE BREEZE PO LIQD CUSTOM
1.0000 | Freq: Three times a day (TID) | ORAL | Status: DC
  Administered 2023-03-27 – 2023-03-30 (×9): 1 via ORAL

## 2023-03-27 MED ORDER — VANCOMYCIN HCL IN DEXTROSE 1-5 GM/200ML-% IV SOLN
1000.0000 mg | Freq: Two times a day (BID) | INTRAVENOUS | Status: DC
  Administered 2023-03-27 – 2023-03-28 (×2): 1000 mg via INTRAVENOUS
  Filled 2023-03-27 (×2): qty 200

## 2023-03-27 MED ORDER — SUCRALFATE 1 GM/10ML PO SUSP
1.0000 g | Freq: Two times a day (BID) | ORAL | Status: DC
  Administered 2023-03-27 – 2023-03-30 (×7): 1 g via ORAL
  Filled 2023-03-27 (×7): qty 10

## 2023-03-27 MED ORDER — CIPROFLOXACIN HCL 500 MG PO TABS
500.0000 mg | ORAL_TABLET | Freq: Every day | ORAL | Status: DC
  Administered 2023-03-30: 500 mg via ORAL
  Filled 2023-03-27: qty 1

## 2023-03-27 MED ORDER — PANTOPRAZOLE SODIUM 40 MG PO TBEC
40.0000 mg | DELAYED_RELEASE_TABLET | Freq: Two times a day (BID) | ORAL | Status: DC
  Administered 2023-03-27 – 2023-03-30 (×5): 40 mg via ORAL
  Filled 2023-03-27 (×5): qty 1

## 2023-03-27 NOTE — Progress Notes (Signed)
Occupational Therapy Treatment Patient Details Name: Nathan Roberts MRN: [id number] DOB: [birth year] Today's Date: 03/27/2023   History of present illness Patient is a 62 year old male who presented on 8/21 with abdominal distension and possible fall. Patient underwent paracentesis on 8/21 with 10L removed. Patient was admitted with decompensated hepatic cirrhosis, ascites, hypothermia, sepsis, acute on chronic iron deficiency anemia due to chronic blood loss. 8/22 patient was transitioned to SDU with another paracentesis completed removing 4L. 8/25 patient had another paracentesis with 6.3L removed. PMH: hypoglycemia, umbilical hernia, hyperlipidemia, decompensated cirrhosis, anxiety, esophageal varices, alcohol abuse.   OT comments  Patient was able to progress more on feet with increased time. Patient continues to have discomfort on bottom and distended abdomen limiting patients participation. Patient's discharge plan remains appropriate at this time. OT will continue to follow acutely.        If plan is discharge home, recommend the following:  A lot of help with bathing/dressing/bathroom;Assistance with cooking/housework;Direct supervision/assist for medications management;Assist for transportation;Help with stairs or ramp for entrance;Direct supervision/assist for financial management;A lot of help with walking and/or transfers   Equipment Recommendations  None recommended by OT       Precautions / Restrictions Precautions Precautions: Fall Restrictions Weight Bearing Restrictions: No       Mobility Bed Mobility Overal bed mobility: Needs Assistance Bed Mobility: Supine to Sit     Supine to sit: Mod assist     General bed mobility comments: assist trunk to upright             Balance Overall balance assessment: Needs assistance Sitting-balance support: Bilateral upper extremity supported, Feet supported Sitting balance-Leahy Scale: Fair     Standing balance  support: During functional activity, Bilateral upper extremity supported, Reliant on assistive device for balance Standing balance-Leahy Scale: Poor           ADL either performed or assessed with clinical judgement   ADL Overall ADL's : Needs assistance/impaired         Lower Body Dressing Details (indicate cue type and reason): max A to adjust socks with current distended belly. Toilet Transfer: Minimal assistance;Ambulation;Rolling walker (2 wheels) Toilet Transfer Details (indicate cue type and reason): in room to make it from one side of the bed to the other.           General ADL Comments: patient declineing to participate in ADLs furhter at this time reporting he wanted breakfast first. nurse made aware.    Extremity/Trunk Assessment Upper Extremity Assessment Upper Extremity Assessment: Generalized weakness   Lower Extremity Assessment Lower Extremity Assessment: Generalized weakness   Cervical / Trunk Assessment Cervical / Trunk Assessment: Other exceptions Cervical / Trunk Exceptions: ascites     Cognition Arousal: Alert Behavior During Therapy: WFL for tasks assessed/performed Overall Cognitive Status: Difficult to assess         General Comments: slow to respond at times. patient noted to repeat questions during session, oriented to palce and  day of week, yr                   Pertinent Vitals/ Pain       Pain Assessment Pain Assessment: Faces Faces Pain Scale: Hurts a little bit Pain Location: bottom from flexiseal Pain Descriptors / Indicators: Discomfort Pain Intervention(s): Monitored during session         Frequency  Min 1X/week        Progress Toward Goals  OT Goals(current goals can now be found in the  care plan section)  Progress towards OT goals: Progressing toward goals     Plan      Co-evaluation    PT/OT/SLP Co-Evaluation/Treatment: Yes Reason for Co-Treatment: For patient/therapist safety PT goals addressed  during session: Mobility/safety with mobility OT goals addressed during session: ADL's and self-care      AM-PAC OT "6 Clicks" Daily Activity     Outcome Measure   Help from another person eating meals?: A Little Help from another person taking care of personal grooming?: A Little Help from another person toileting, which includes using toliet, bedpan, or urinal?: A Lot Help from another person bathing (including washing, rinsing, drying)?: A Lot Help from another person to put on and taking off regular upper body clothing?: A Little Help from another person to put on and taking off regular lower body clothing?: A Lot 6 Click Score: 15    End of Session Equipment Utilized During Treatment: Rolling walker (2 wheels)  OT Visit Diagnosis: Unsteadiness on feet (R26.81);Other abnormalities of gait and mobility (R26.89);Muscle weakness (generalized) (M62.81)   Activity Tolerance Patient limited by fatigue   Patient Left in chair;with call bell/phone within reach;with chair alarm set   Nurse Communication Mobility status        Time: 4696-2952 OT Time Calculation (min): 22 min  Charges: OT General Charges $OT Visit: 1 Visit OT Treatments $Self Care/Home Management : 8-22 mins  Rosalio Loud, MS Acute Rehabilitation Department Office# [phone]   Selinda Flavin 03/27/2023, 1:13 PM

## 2023-03-27 NOTE — Progress Notes (Signed)
[facility] Progress Note Patient: Nathan Roberts XBJ:[id number] DOB: 1960-12-22 DOA: 03/21/2023  DOS: the patient was seen and examined on 03/27/2023  Brief hospital course: PMH significant of decompensated cirrhosis, anxiety, chronic anemia-iron deficiency, esophageal varices, history of alcohol abuse, refractory ascites.  Present to the hospital with complaints of a possible fall and abdominal distention due to missing paracentesis. 8/21 underwent paracentesis, 10 L removed admitted for suspected sepsis.,  Received 1 PRBC. 8/22 transferred to ICU, required Levophed, another paracentesis with 4 L removed.  Oncology as well as GI were consulted.  2 PRBC received. 8/23 transfer back to the [facility] service.  1 more PRBC given. 8/24 started to have fever in the evening/night. 8/25 ongoing fever.  Vancomycin added.  Paracentesis with 6.3 L removed.   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 CP class C. Sees Dr. Carrie Mew at [facility]. EGD 10/23 grade 1 and small esophageal varices. TIPS was recommended for the patient outpatient. Was on Cipro daily. Supposed to be on lactulose 30 mL 3 times daily. AFP 6.2 August 24. So far has received 3 paracentesis. Treated aggressively with IV albumin. GI consulted. On Protonix. EGD on 8/29 recommended. On soft diet. Repeat paracentesis ordered for 8/28.  Hypothermia, suspected sepsis with SBP Now with FUO. On admission with hypothermia, hypotension, organ damage and electrolyte abnormality treated as a sepsis likely due to SBP. Acetic fluid negative so far for any bacterial growth. Gram stain does not show any evidence of SBP as well. Has been on Zosyn since admission. Hypothermia has resolved.  Now has fever. RVP negative. CT abdomen negative for any other abnormality.  UA unremarkable. MRSA PCR negative.    Blood culture growing staph epidermis in anaerobic bottle and gram-positive rods in both bottles. Will add IV vancomycin due to ongoing fever. Repeat cultures have been performed on 8/25-follow-up. Chest x-ray unremarkable.  Pro-Cal minimally elevated.  Currently on Augmentin.  Later on we will switch to his chronic Cipro. Currently on vancomycin.  If cultures are negative on 8/28 of 48 hours can discontinue that medication.   Fall at home No focal deficit. CT head C-spine negative for any acute fracture. Monitor for now.  Acute on chronic iron deficiency anemia due to chronic blood loss Hemoglobin 11 in August. Hemoglobin initially 7.7.   CT abdomen negative for retroperitoneal hemorrhage.  S/P 1 PRBC 8/21. 2 PRBC 8/22. 1 PRBC 8/23. Hematology consulted for concern for hemolysis but suspect most likely presentation with GI bleed. Lactulose enema revealed melanotic stool. Patient was on Protonix drip.  Now on Protonix every 12 hours. Patient provided consent for blood transfusion.  Transfuse for hemoglobin less than 7 or hemodynamic instability.   Hyponatremia. Hyperkalemia Acute kidney injury. Prior sodium 131.  On admission 125.  Currently sodium improving. Potassium level improved as well. S creatinine at baseline 0.7.  On admission 1.29 After aggressive treatment with IV albumin and blood transfusion serum creatinine back to baseline. Elevated potassium most likely in the setting of ongoing use of Aldactone. Treated with Kayexalate enema.  Hyponatremia mostly in the setting of poor p.o. intake as well as cirrhosis. Monitor daily.  Acute urinary retention. Required In-N-Out catheterization x 2 and later on had a Foley catheter placed. Unable to initiate Flomax due to hypotension Given that his thrombocytopenia and risk of bleeding, voiding trial becomes difficult and too risky.   Now that the platelet counts are  improving.  Will remove Foley catheter tomorrow  morning.  Hypoglycemia. In the setting of poor p.o. intake. Currently on D10.  On soft diet.  Mild hepatic encephalopathy. Ammonia level elevated. Asterixis present.  Improving. Continue lactulose.   Chronic thrombocytopenia. Worsening, likely dilutional in nature. Hematology following. In the setting of alcohol cirrhosis. Monitor for now.   Hypotension Suspected hemorrhagic shock versus distributive shock in the setting of hypoalbuminemia. History of hypertension On midodrine.  Briefly required Levophed. Holding home Inderal. Blood pressure improved.   Hyperlipidemia Holding statin for now.   Umbilical hernia without obstruction and without gangrene Monitor for now.  Currently no evidence of obstruction.  Goals of care conversation. Palliative care consulted. Patient wanted to be full code.  No HCPOA.  Required emergency consent for PICC line.   Subjective: No nausea no vomiting no fever no chills.  Oral intake adequate.  On no blood in the rectal tube.  Physical Exam: General: in Mild distress, No Rash Cardiovascular: S1 and S2 Present, No Murmur Respiratory: Good respiratory effort, Bilateral Air entry present. No Crackles, No wheezes Abdomen: Bowel Sound present, distended, tenderness Extremities: No edema Neuro: Alert and oriented x3, no new focal deficit  Data Reviewed: I have Reviewed nursing notes, Vitals, and Lab results. Since last encounter, pertinent lab results CBC and BMP   . I have ordered test including CBC and BMP  . I have ordered imaging paracentesis  .    Disposition: Status is: Inpatient Remains inpatient appropriate because: Needing further workup  SCDs Start: 03/21/23 1819   Family Communication: No one at bedside Level of care: Progressive   Vitals:   03/27/23 1000 03/27/23 1100 03/27/23 1200 03/27/23 1418  BP:   (!) 90/53 107/67  Pulse:  86 80 81  Resp: (!) 26 (!) 26 (!) 25 18  Temp: 99 F (37.2 C) 98.8 F (37.1 C) 98.6 F (37  C) 98.6 F (37 C)  TempSrc:   Bladder Oral  SpO2:  94% 92% 95%  Weight:      Height:         Author: Lynden Oxford, MD 03/27/2023 7:23 PM  Please look on [email or web address] to find out who is on call.

## 2023-03-27 NOTE — Plan of Care (Signed)
  Problem: Education: Goal: Ability to describe self-care measures that may prevent or decrease complications (Diabetes Survival Skills Education) will improve Outcome: Progressing   Problem: Coping: Goal: Ability to adjust to condition or change in health will improve Outcome: Progressing   Problem: Health Behavior/Discharge Planning: Goal: Ability to manage health-related needs will improve Outcome: Progressing   

## 2023-03-27 NOTE — H&P (View-Only) (Signed)
[facility] Progress Note  SUBJECTIVE:   Interval history: Nathan Roberts was seen and evaluated today at bedside. Resting in bed. Noted feeling much better today after having rectal tube removed, he denied bowel movement today, Hgb stable. No abdominal pain. No nausea or vomiting. No chest pain or shortness of breath. Noted that he had what seems like the start of a TIPS evaluation in the outpatient setting, he is apprehensive to proceed given procedural risks. 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   Past Medical History:  Diagnosis Date   Allergy    Arthritis    Carpal tunnel syndrome    Diabetes mellitus without complication (HCC)    Esophageal varices (HCC)    GERD (gastroesophageal reflux disease)    GI bleed    Hyperlipidemia    Hypertension    Pancreas divisum 03/22/2023   Portal hypertensive gastropathy (HCC)    Pre-diabetes    Past Surgical History:  Procedure Laterality Date   abscess     rectal area   CARPAL TUNNEL RELEASE     left   CARPAL TUNNEL RELEASE Right 07/07/2021   Procedure: RIGHT CARPAL TUNNEL RELEASE;  Surgeon: Cindee Salt, MD;  Location: [facility];  Service: Orthopedics;  Laterality: Right;   CERVICAL FUSION     COLONOSCOPY WITH PROPOFOL N/A 05/29/2022   Procedure: COLONOSCOPY WITH PROPOFOL;  Surgeon: Napoleon Form, MD;  Location: [facility];  Service: Gastroenterology;  Laterality: N/A;   ESOPHAGEAL BANDING  10/07/2020   Procedure: ESOPHAGEAL BANDING;  Surgeon: Napoleon Form, MD;  Location: [facility];  Service: Endoscopy;;   ESOPHAGOGASTRODUODENOSCOPY (EGD) WITH PROPOFOL N/A 10/07/2020   Procedure: ESOPHAGOGASTRODUODENOSCOPY (EGD) WITH PROPOFOL;  Surgeon: Napoleon Form, MD;  Location: [facility];  Service: Endoscopy;  Laterality: N/A;   ESOPHAGOGASTRODUODENOSCOPY (EGD) WITH PROPOFOL N/A 01/18/2021   Procedure: ESOPHAGOGASTRODUODENOSCOPY (EGD) WITH PROPOFOL;  Surgeon: Napoleon Form,  MD;  Location: [facility];  Service: Endoscopy;  Laterality: N/A;   ESOPHAGOGASTRODUODENOSCOPY (EGD) WITH PROPOFOL N/A 05/29/2022   Procedure: ESOPHAGOGASTRODUODENOSCOPY (EGD) WITH PROPOFOL;  Surgeon: Napoleon Form, MD;  Location: [facility];  Service: Gastroenterology;  Laterality: N/A;   FASCIECTOMY Left 02/10/2021   Procedure: FASCIECTOMY RING FINGER AND SMALL FINGER OF LEFT HAND;  Surgeon: Cindee Salt, MD;  Location: [facility];  Service: Orthopedics;  Laterality: Left;   FASCIECTOMY Right 07/07/2021   Procedure: FASCIECTOMY RIGHT 1ST WEB, RIGHT RING FINGER, RIGHT SMALL FINGER;  Surgeon: Cindee Salt, MD;  Location: [facility];  Service: Orthopedics;  Laterality: Right;   SPINE SURGERY     Current Facility-Administered Medications  Medication Dose Route Frequency Provider Last Rate Last Admin   0.9 %  sodium chloride infusion  250 mL Intravenous Continuous Selmer Dominion B, NP   Stopped at 03/25/23 0227   amoxicillin-clavulanate (AUGMENTIN) 875-125 MG per tablet 1 tablet  1 tablet Oral Q12H Rolly Salter, MD   1 tablet at 03/27/23 0900   Chlorhexidine Gluconate Cloth 2 % PADS 6 each  6 each Topical Daily Rolly Salter, MD   6 each at 03/27/23 1628   [START ON 03/29/2023] ciprofloxacin (CIPRO) tablet 500 mg  500 mg Oral Q breakfast Rolly Salter, MD       feeding supplement (BOOST / RESOURCE BREEZE) liquid 1 Container  1 Container Oral TID BM Rolly Salter, MD   1 Container at 03/27/23 1350   guaiFENesin-dextromethorphan (ROBITUSSIN DM) 100-10 MG/5ML syrup 5 mL  5 mL Oral Q4H PRN Rolly Salter, MD   5 mL at 03/23/23 1339   lactulose (CHRONULAC) 10 GM/15ML solution 20 g  20 g Oral TID Rolly Salter, MD   20 g at 03/27/23 1623   midodrine (PROAMATINE) tablet 10 mg  10 mg Oral TID PC Rolly Salter, MD   10 mg at 03/27/23 1347   [START ON 03/28/2023] multivitamin with minerals tablet 1 tablet  1 tablet Oral Daily Rolly Salter, MD        ondansetron [facility]) injection 4 mg  4 mg Intravenous Q6H PRN Rolly Salter, MD       Oral care mouth rinse  15 mL Mouth Rinse PRN Luciano Cutter, MD   15 mL at 03/23/23 0000   pantoprazole (PROTONIX) EC tablet 40 mg  40 mg Oral BID AC Rolly Salter, MD   40 mg at 03/27/23 1623   simethicone (MYLICON) chewable tablet 80 mg  80 mg Oral QID Rolly Salter, MD   80 mg at 03/27/23 1347   sodium chloride flush (NS) 0.9 % injection 10-40 mL  10-40 mL Intracatheter Q12H Luciano Cutter, MD   10 mL at 03/27/23 0901   sodium chloride flush (NS) 0.9 % injection 10-40 mL  10-40 mL Intracatheter PRN Luciano Cutter, MD       sodium chloride flush (NS) 0.9 % injection 3 mL  3 mL Intravenous Q12H Rolly Salter, MD   3 mL at 03/27/23 0901   sucralfate (CARAFATE) 1 GM/10ML suspension 1 g  1 g Oral BID Rolly Salter, MD   1 g at 03/27/23 1138   vancomycin (VANCOCIN) IVPB 1000 mg/200 mL premix  1,000 mg Intravenous Q12H Ellington, Abby K, RPH       vancomycin (VANCOREADY) IVPB 1750 mg/350 mL  1,750 mg Intravenous Once Winfield Rast, RPH 175 mL/hr at 03/25/23 1900 Infusion Verify at 03/25/23 1900   Allergies as of 03/21/2023 - Review Complete 03/21/2023  Allergen Reaction Noted   Cephalexin Rash 08/12/2017   Doxycycline Rash 10/06/2020   Latex Hives and Rash 11/01/2011   Review of Systems:  Review of Systems  Respiratory:  Negative for shortness of breath.   Cardiovascular:  Negative for chest pain.  Gastrointestinal:  Negative for abdominal pain, nausea and vomiting.    OBJECTIVE:   Temp:  [98.6 F (37 C)-99.7 F (37.6 C)] 98.6 F (37 C) (08/27 1418) Pulse Rate:  [67-96] 81 (08/27 1418) Resp:  [18-40] 18 (08/27 1418) BP: (78-129)/(33-69) 107/67 (08/27 1418) SpO2:  [90 %-97 %] 95 % (08/27 1418) Weight:  [83.9 kg] 83.9 kg (08/27 0600) Last BM Date : 03/26/23 Physical Exam Constitutional:      General: He is not in acute distress.    Appearance: He is ill-appearing. He is not  toxic-appearing or diaphoretic.  Cardiovascular:     Rate and Rhythm: Normal rate and regular rhythm.  Pulmonary:     Effort: No respiratory distress.     Breath sounds: Normal breath sounds.  Abdominal:     General: Bowel sounds are normal. There is distension.     Palpations: Abdomen is soft.     Tenderness: There is no abdominal tenderness.  Neurological:     Mental Status: He is alert.     Labs: Recent Labs    03/25/23 0942 03/26/23 0443 03/27/23 0525  WBC 9.2 10.4 10.5  HGB 9.7* 9.1* 9.0*  HCT 28.8* 27.2* 26.7*  PLT  73* 69* 84*   BMET Recent Labs    03/25/23 0942 03/26/23 0443 03/27/23 0525  NA 126* 126* 124*  K 3.3* 3.8 3.9  CL 95* 98 98  CO2 22 22 22   GLUCOSE 135* 123* 123*  BUN 20 20 23   CREATININE 0.68 0.61 0.61  CALCIUM 8.1* 8.3* 8.2*   LFT Recent Labs    03/27/23 0525  PROT 5.3*  ALBUMIN 3.5  AST 40  ALT 17  ALKPHOS 87  BILITOT 4.0*   PT/INR Recent Labs    03/25/23 0537  LABPROT 26.5*  INR 2.4*   Diagnostic imaging: DG CHEST PORT 1 VIEW  Result Date: 03/25/2023 CLINICAL DATA:  Shortness of breath, history of recent paracentesis EXAM: PORTABLE CHEST 1 VIEW COMPARISON:  03/21/2023 FINDINGS: The cardiac shadow is stable. Overall inspiratory effort is poor. Right PICC is noted with catheter tip at the cavoatrial junction. Patchy atelectatic changes are noted in the right lung new from the prior study. No effusion is seen. IMPRESSION: Mild atelectatic changes in the right lung new from the prior exam. Electronically Signed   By: Alcide Clever M.D.   On: 03/25/2023 20:21    IMPRESSION: Normocytic anemia Thrombocytopenia Concern for melena (appears more dark brown on my evaluation), Hgb stable, no further melena today, rectal tube removed  SIRS (febrile, tachypnea), appears improved/resolved   EtOH cirrhosis decompensated by esophageal varices and treatment resistant ascites             -Last EGD 04/2022 with small varices               -Paracentesis 03/25/23 culture no growth to date, 03/21/23 culture no growth             -Previously recommended to have outpatient TIPs evaluation Coagulopathy    PLAN: -Repeat paracentesis ordered and pending, abdomen appears distended today, patient pending outpatient evaluation for TIPS procedure -Hgb stable, rectal tube removed, given concern for anemia and melena earlier this admission, consider EGD on 03/29/23 (no endoscopy availability 03/28/23, would benefit from paracentesis before procedure) -Discussed EGD procedure with patient, he verbalized understanding and agreement -Check INR and PLT tomorrow, may need VK pending results -Ok for low sodium diet -[facility] will follow   LOS: 6 days   Liliane Shi, [facility]

## 2023-03-27 NOTE — Progress Notes (Signed)
Initial Nutrition Assessment  DOCUMENTATION CODES:   Severe malnutrition in context of chronic illness  INTERVENTION:  - DYS 3 diet with fluid restriction per MD.  - Boost Breeze po TID, each supplement provides 250 kcal and 9 grams of protein - Encourage intake at all meals and of supplements.  - Multivitamin with minerals daily - Monitor weight trends.   NUTRITION DIAGNOSIS:   Severe Malnutrition related to chronic illness (decompensated cirrhosis) as evidenced by severe fat depletion, severe muscle depletion.  GOAL:   Patient will meet greater than or equal to 90% of their needs  MONITOR:   PO intake, Supplement acceptance, Diet advancement, Weight trends, I & O's  REASON FOR ASSESSMENT:   Consult Assessment of nutrition requirement/status  ASSESSMENT:   62 y.o. male with PMH significant of decompensated cirrhosis, anxiety, chronic anemia-iron deficiency, esophageal varices, history of alcohol abuse, refractory ascites who presented with complaints of a possible fall and abdominal distention due to missing paracentesis.   8/21 Admit; underwent paracentesis with 10 L removed 8/22 another paracentesis with 4 L removed.   8/25 Paracentesis with 6.3 L removed  Patient sleepy during visit but able to answer all questions. Reports a UBW of 205# and denies any changes in weight. Per EMR, patient weighed at 222# in May and now weighed at 185#, which could indicate significant weight loss. However, patient noted to have had a total of 20.4L removed with paracentesis which could be affecting weight status. Patient also adamantly denies he has lost weight. Will need to monitor weight trends closely.   He endorses eating well at home with 2 meals a day (breakfast and dinner) and fair appetite. States he was eating normally up until admission.  Current appetite is decreased but patient documented to be consuming 25-75% of meals. Had 50% of breakfast this AM. Ordered Ensure  with ONS protocol and patient reports drinking it the past 2 days but now thinks it may be giving him diarrhea. Suspect this could be related to the antibiotics patient is on. However, patient would like to try another supplements and agreeable to trial Boost Breeze instead.     Medications reviewed and include: Carafate  Labs reviewed:  Na 124 Magnesium 2.5   NUTRITION - FOCUSED PHYSICAL EXAM:  Flowsheet Row Most Recent Value  Orbital Region Moderate depletion  Upper Arm Region Severe depletion  Thoracic and Lumbar Region Moderate depletion  Buccal Region Severe depletion  Temple Region Severe depletion  Clavicle Bone Region Severe depletion  Clavicle and Acromion Bone Region Severe depletion  Scapular Bone Region Unable to assess  Dorsal Hand Mild depletion  Patellar Region Mild depletion  Anterior Thigh Region Mild depletion  Posterior Calf Region Mild depletion  Edema (RD Assessment) None  Hair Reviewed  Eyes Reviewed  Mouth Reviewed  Skin Reviewed  Nails Reviewed       Diet Order:   Diet Order             DIET DYS 3 Room service appropriate? Yes; Fluid consistency: Thin; Fluid restriction: 1200 mL Fluid  Diet effective now                   EDUCATION NEEDS:  Education needs have been addressed  Skin:  Skin Assessment: Reviewed RN Assessment  Last BM:  8/26  Height:  Ht Readings from Last 1 Encounters:  03/21/23 5\' 8"  (1.727 m)   Weight:  Wt Readings from Last 1 Encounters:  03/27/23 83.9 kg  BMI:  Body mass index is 28.12 kg/m.  Estimated Nutritional Needs:  Kcal:  2100-2350 kcals Protein:  100-115 grams Fluid:  >/= 2.1L or per MD    Shelle Iron RD, LDN For contact information, refer to [facility].

## 2023-03-27 NOTE — Progress Notes (Signed)
PHARMACY NOTE:  ANTIMICROBIAL RENAL DOSAGE ADJUSTMENT  Current antimicrobial regimen includes a mismatch between antimicrobial dosage and estimated renal function.  As per policy approved by the Pharmacy & Therapeutics and Medical Executive Committees, the antimicrobial dosage will be adjusted accordingly.  Current antimicrobial dosage:  vancomycin 750 mg IV q12h  Indication: sepsis  Renal Function:  Estimated Creatinine Clearance: 98.4 mL/min (by C-G formula based on SCr of 0.61 mg/dL).    Antimicrobial dosage has been changed to:  vancomycin 1000 mg IV q12h    Thank you for allowing pharmacy to be a part of this patient's care.  Pricilla Riffle, PharmD, BCPS Clinical Pharmacist 03/27/2023 9:37 AM

## 2023-03-27 NOTE — Evaluation (Signed)
Physical Therapy Evaluation Patient Details Name: Nathan Roberts MRN: [id number] DOB: 27-Jul-1961 Today's Date: 03/27/2023  History of Present Illness  Patient is a 62 year old male who presented on 8/21 with abdominal distension and possible fall. Patient underwent paracentesis on 8/21 with 10L removed. Patient was admitted with decompensated hepatic cirrhosis, ascites, hypothermia, sepsis, acute on chronic iron deficiency anemia due to chronic blood loss. 8/22 patient was transitioned to SDU with another paracentesis completed removing 4L. 8/25 patient had another paracentesis with 6.3L removed. PMH: hypoglycemia, umbilical hernia, hyperlipidemia, decompensated cirrhosis, anxiety, esophageal varices, alcohol abuse.  Clinical Impression  Pt admitted with above diagnosis. Pt currently with functional limitations due to the deficits listed below (see PT Problem List). Pt will benefit from acute skilled PT to increase their independence and safety with mobility to allow discharge.     The [patient easily aroused from sleep and participatory. Patient ambulated, slowly, using RW  x 15'.  BP 116/63 after ambulation, 93 HR 95% on RA.  Continue progressive mobility. Patient  has limited support at DC. Patient will benefit from continued inpatient follow up therapy, <3 hours/day       If plan is discharge home, recommend the following: A little help with walking and/or transfers;A little help with bathing/dressing/bathroom;Help with stairs or ramp for entrance;Assistance with cooking/housework;Assist for transportation   Can travel by private vehicle   No    Equipment Recommendations Rolling walker (2 wheels)  Recommendations for Other Services       Functional Status Assessment Patient has had a recent decline in their functional status and demonstrates the ability to make significant improvements in function in a reasonable and predictable amount of time.     Precautions / Restrictions  Precautions Precautions: Fall Restrictions Weight Bearing Restrictions: No      Mobility  Bed Mobility Overal bed mobility: Needs Assistance Bed Mobility: Supine to Sit     Supine to sit: Mod assist     General bed mobility comments: assist trunk to upright    Transfers Overall transfer level: Needs assistance Equipment used: Rolling walker (2 wheels) Transfers: Sit to/from Stand Sit to Stand: Min assist           General transfer comment: steady assist to rise from bed    Ambulation/Gait Ambulation/Gait assistance: Min assist, +2 safety/equipment Gait Distance (Feet): 15 Feet Assistive device: Rolling walker (2 wheels) Gait Pattern/deviations: Step-to pattern, Shuffle Gait velocity: decr     General Gait Details: verbal and tactile cues to keep moving forward, some guidance of RW  Stairs            Wheelchair Mobility     Tilt Bed    Modified Rankin (Stroke Patients Only)       Balance Overall balance assessment: Needs assistance Sitting-balance support: Bilateral upper extremity supported, Feet supported Sitting balance-Leahy Scale: Fair     Standing balance support: During functional activity, Bilateral upper extremity supported, Reliant on assistive device for balance Standing balance-Leahy Scale: Poor                               Pertinent Vitals/Pain Pain Assessment Faces Pain Scale: Hurts a little bit Pain Location: bottom from flexiseal Pain Descriptors / Indicators: Discomfort Pain Intervention(s): Monitored during session    Home Living Family/patient expects to be discharged to:: Private residence Living Arrangements: Alone Available Help at Discharge: Neighbor Type of Home: Other(Comment) Home Access: Level entry  Alternate Level Stairs-Number of Steps: flight Home Layout: Two level;Bed/bath upstairs Home Equipment: Cane - single point Additional Comments: pt reports planning to move to [place] to be closer  to family    Prior Function Prior Level of Function : Independent/Modified Independent             Mobility Comments: pt reports ind without AD, pt denies falls ADLs Comments: pt reports ind     Extremity/Trunk Assessment   Upper Extremity Assessment Upper Extremity Assessment: Generalized weakness    Lower Extremity Assessment Lower Extremity Assessment: Generalized weakness    Cervical / Trunk Assessment Cervical / Trunk Assessment: Other exceptions Cervical / Trunk Exceptions: ascites  Communication      Cognition Arousal: Alert Behavior During Therapy: WFL for tasks assessed/performed Overall Cognitive Status: Difficult to assess                                 General Comments: slow to respond at times. patient noted to repeat questions during session, oriented to palce and  day of week, yr        General Comments      Exercises     Assessment/Plan    PT Assessment Patient needs continued PT services  PT Problem List Decreased strength;Decreased mobility;Decreased range of motion;Decreased knowledge of precautions;Decreased activity tolerance;Decreased balance;Pain       PT Treatment Interventions DME instruction;Therapeutic activities;Gait training;Therapeutic exercise;Patient/family education;Functional mobility training    PT Goals (Current goals can be found in the Care Plan section)  Acute Rehab PT Goals Patient Stated Goal: move to [place] PT Goal Formulation: With patient Time For Goal Achievement: 04/10/23 Potential to Achieve Goals: Fair    Frequency Min 1X/week     Co-evaluation PT/OT/SLP Co-Evaluation/Treatment: Yes Reason for Co-Treatment: For patient/therapist safety PT goals addressed during session: Mobility/safety with mobility OT goals addressed during session: ADL's and self-care       AM-PAC PT "6 Clicks" Mobility  Outcome Measure Help needed turning from your back to your side while in a flat bed without using  bedrails?: A Little Help needed moving from lying on your back to sitting on the side of a flat bed without using bedrails?: A Lot Help needed moving to and from a bed to a chair (including a wheelchair)?: A Lot Help needed standing up from a chair using your arms (e.g., wheelchair or bedside chair)?: A Lot Help needed to walk in hospital room?: A Lot Help needed climbing 3-5 steps with a railing? : Total 6 Click Score: 12    End of Session Equipment Utilized During Treatment: Gait belt Activity Tolerance: Patient limited by fatigue Patient left: in chair;with call bell/phone within reach;with chair alarm set Nurse Communication: Mobility status PT Visit Diagnosis: Unsteadiness on feet (R26.81);Difficulty in walking, not elsewhere classified (R26.2);Muscle weakness (generalized) (M62.81)    Time: 2841-3244 PT Time Calculation (min) (ACUTE ONLY): 24 min   Charges:   PT Evaluation $PT Eval Low Complexity: 1 Low   PT General Charges $$ ACUTE PT VISIT: 1 Visit         Blanchard Kelch PT Acute Rehabilitation Services Office [phone] Weekend [phone]   Rada Hay 03/27/2023, 10:42 AM

## 2023-03-27 NOTE — Progress Notes (Signed)
[facility] Progress Note  SUBJECTIVE:   Interval history: Nathan Roberts was seen and evaluated today at bedside. Resting in bed. Noted feeling much better today after having rectal tube removed, he denied bowel movement today, Hgb stable. No abdominal pain. No nausea or vomiting. No chest pain or shortness of breath. Noted that he had what seems like the start of a TIPS evaluation in the outpatient setting, he is apprehensive to proceed given procedural risks. 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   Past Medical History:  Diagnosis Date   Allergy    Arthritis    Carpal tunnel syndrome    Diabetes mellitus without complication (HCC)    Esophageal varices (HCC)    GERD (gastroesophageal reflux disease)    GI bleed    Hyperlipidemia    Hypertension    Pancreas divisum 03/22/2023   Portal hypertensive gastropathy (HCC)    Pre-diabetes    Past Surgical History:  Procedure Laterality Date   abscess     rectal area   CARPAL TUNNEL RELEASE     left   CARPAL TUNNEL RELEASE Right 07/07/2021   Procedure: RIGHT CARPAL TUNNEL RELEASE;  Surgeon: Cindee Salt, MD;  Location: [facility];  Service: Orthopedics;  Laterality: Right;   CERVICAL FUSION     COLONOSCOPY WITH PROPOFOL N/A 05/29/2022   Procedure: COLONOSCOPY WITH PROPOFOL;  Surgeon: Napoleon Form, MD;  Location: [facility];  Service: Gastroenterology;  Laterality: N/A;   ESOPHAGEAL BANDING  10/07/2020   Procedure: ESOPHAGEAL BANDING;  Surgeon: Napoleon Form, MD;  Location: [facility];  Service: Endoscopy;;   ESOPHAGOGASTRODUODENOSCOPY (EGD) WITH PROPOFOL N/A 10/07/2020   Procedure: ESOPHAGOGASTRODUODENOSCOPY (EGD) WITH PROPOFOL;  Surgeon: Napoleon Form, MD;  Location: [facility];  Service: Endoscopy;  Laterality: N/A;   ESOPHAGOGASTRODUODENOSCOPY (EGD) WITH PROPOFOL N/A 01/18/2021   Procedure: ESOPHAGOGASTRODUODENOSCOPY (EGD) WITH PROPOFOL;  Surgeon: Napoleon Form,  MD;  Location: [facility];  Service: Endoscopy;  Laterality: N/A;   ESOPHAGOGASTRODUODENOSCOPY (EGD) WITH PROPOFOL N/A 05/29/2022   Procedure: ESOPHAGOGASTRODUODENOSCOPY (EGD) WITH PROPOFOL;  Surgeon: Napoleon Form, MD;  Location: [facility];  Service: Gastroenterology;  Laterality: N/A;   FASCIECTOMY Left 02/10/2021   Procedure: FASCIECTOMY RING FINGER AND SMALL FINGER OF LEFT HAND;  Surgeon: Cindee Salt, MD;  Location: [facility];  Service: Orthopedics;  Laterality: Left;   FASCIECTOMY Right 07/07/2021   Procedure: FASCIECTOMY RIGHT 1ST WEB, RIGHT RING FINGER, RIGHT SMALL FINGER;  Surgeon: Cindee Salt, MD;  Location: [facility];  Service: Orthopedics;  Laterality: Right;   SPINE SURGERY     Current Facility-Administered Medications  Medication Dose Route Frequency Provider Last Rate Last Admin   0.9 %  sodium chloride infusion  250 mL Intravenous Continuous Selmer Dominion B, NP   Stopped at 03/25/23 0227   amoxicillin-clavulanate (AUGMENTIN) 875-125 MG per tablet 1 tablet  1 tablet Oral Q12H Rolly Salter, MD   1 tablet at 03/27/23 0900   Chlorhexidine Gluconate Cloth 2 % PADS 6 each  6 each Topical Daily Rolly Salter, MD   6 each at 03/27/23 1628   [START ON 03/29/2023] ciprofloxacin (CIPRO) tablet 500 mg  500 mg Oral Q breakfast Rolly Salter, MD       feeding supplement (BOOST / RESOURCE BREEZE) liquid 1 Container  1 Container Oral TID BM Rolly Salter, MD   1 Container at 03/27/23 1350   guaiFENesin-dextromethorphan (ROBITUSSIN DM) 100-10 MG/5ML syrup 5 mL  5 mL Oral Q4H PRN Rolly Salter, MD   5 mL at 03/23/23 1339   lactulose (CHRONULAC) 10 GM/15ML solution 20 g  20 g Oral TID Rolly Salter, MD   20 g at 03/27/23 1623   midodrine (PROAMATINE) tablet 10 mg  10 mg Oral TID PC Rolly Salter, MD   10 mg at 03/27/23 1347   [START ON 03/28/2023] multivitamin with minerals tablet 1 tablet  1 tablet Oral Daily Rolly Salter, MD        ondansetron [facility]) injection 4 mg  4 mg Intravenous Q6H PRN Rolly Salter, MD       Oral care mouth rinse  15 mL Mouth Rinse PRN Luciano Cutter, MD   15 mL at 03/23/23 0000   pantoprazole (PROTONIX) EC tablet 40 mg  40 mg Oral BID AC Rolly Salter, MD   40 mg at 03/27/23 1623   simethicone (MYLICON) chewable tablet 80 mg  80 mg Oral QID Rolly Salter, MD   80 mg at 03/27/23 1347   sodium chloride flush (NS) 0.9 % injection 10-40 mL  10-40 mL Intracatheter Q12H Luciano Cutter, MD   10 mL at 03/27/23 0901   sodium chloride flush (NS) 0.9 % injection 10-40 mL  10-40 mL Intracatheter PRN Luciano Cutter, MD       sodium chloride flush (NS) 0.9 % injection 3 mL  3 mL Intravenous Q12H Rolly Salter, MD   3 mL at 03/27/23 0901   sucralfate (CARAFATE) 1 GM/10ML suspension 1 g  1 g Oral BID Rolly Salter, MD   1 g at 03/27/23 1138   vancomycin (VANCOCIN) IVPB 1000 mg/200 mL premix  1,000 mg Intravenous Q12H Ellington, Abby K, RPH       vancomycin (VANCOREADY) IVPB 1750 mg/350 mL  1,750 mg Intravenous Once Winfield Rast, RPH 175 mL/hr at 03/25/23 1900 Infusion Verify at 03/25/23 1900   Allergies as of 03/21/2023 - Review Complete 03/21/2023  Allergen Reaction Noted   Cephalexin Rash 08/12/2017   Doxycycline Rash 10/06/2020   Latex Hives and Rash 11/01/2011   Review of Systems:  Review of Systems  Respiratory:  Negative for shortness of breath.   Cardiovascular:  Negative for chest pain.  Gastrointestinal:  Negative for abdominal pain, nausea and vomiting.    OBJECTIVE:   Temp:  [98.6 F (37 C)-99.7 F (37.6 C)] 98.6 F (37 C) (08/27 1418) Pulse Rate:  [67-96] 81 (08/27 1418) Resp:  [18-40] 18 (08/27 1418) BP: (78-129)/(33-69) 107/67 (08/27 1418) SpO2:  [90 %-97 %] 95 % (08/27 1418) Weight:  [83.9 kg] 83.9 kg (08/27 0600) Last BM Date : 03/26/23 Physical Exam Constitutional:      General: He is not in acute distress.    Appearance: He is ill-appearing. He is not  toxic-appearing or diaphoretic.  Cardiovascular:     Rate and Rhythm: Normal rate and regular rhythm.  Pulmonary:     Effort: No respiratory distress.     Breath sounds: Normal breath sounds.  Abdominal:     General: Bowel sounds are normal. There is distension.     Palpations: Abdomen is soft.     Tenderness: There is no abdominal tenderness.  Neurological:     Mental Status: He is alert.     Labs: Recent Labs    03/25/23 0942 03/26/23 0443 03/27/23 0525  WBC 9.2 10.4 10.5  HGB 9.7* 9.1* 9.0*  HCT 28.8* 27.2* 26.7*  PLT  73* 69* 84*   BMET Recent Labs    03/25/23 0942 03/26/23 0443 03/27/23 0525  NA 126* 126* 124*  K 3.3* 3.8 3.9  CL 95* 98 98  CO2 22 22 22   GLUCOSE 135* 123* 123*  BUN 20 20 23   CREATININE 0.68 0.61 0.61  CALCIUM 8.1* 8.3* 8.2*   LFT Recent Labs    03/27/23 0525  PROT 5.3*  ALBUMIN 3.5  AST 40  ALT 17  ALKPHOS 87  BILITOT 4.0*   PT/INR Recent Labs    03/25/23 0537  LABPROT 26.5*  INR 2.4*   Diagnostic imaging: DG CHEST PORT 1 VIEW  Result Date: 03/25/2023 CLINICAL DATA:  Shortness of breath, history of recent paracentesis EXAM: PORTABLE CHEST 1 VIEW COMPARISON:  03/21/2023 FINDINGS: The cardiac shadow is stable. Overall inspiratory effort is poor. Right PICC is noted with catheter tip at the cavoatrial junction. Patchy atelectatic changes are noted in the right lung new from the prior study. No effusion is seen. IMPRESSION: Mild atelectatic changes in the right lung new from the prior exam. Electronically Signed   By: Alcide Clever M.D.   On: 03/25/2023 20:21    IMPRESSION: Normocytic anemia Thrombocytopenia Concern for melena (appears more dark brown on my evaluation), Hgb stable, no further melena today, rectal tube removed  SIRS (febrile, tachypnea), appears improved/resolved   EtOH cirrhosis decompensated by esophageal varices and treatment resistant ascites             -Last EGD 04/2022 with small varices               -Paracentesis 03/25/23 culture no growth to date, 03/21/23 culture no growth             -Previously recommended to have outpatient TIPs evaluation Coagulopathy    PLAN: -Repeat paracentesis ordered and pending, abdomen appears distended today, patient pending outpatient evaluation for TIPS procedure -Hgb stable, rectal tube removed, given concern for anemia and melena earlier this admission, consider EGD on 03/29/23 (no endoscopy availability 03/28/23, would benefit from paracentesis before procedure) -Discussed EGD procedure with patient, he verbalized understanding and agreement -Check INR and PLT tomorrow, may need VK pending results -Ok for low sodium diet -[facility] will follow   LOS: 6 days   Liliane Shi, [facility]

## 2023-03-28 ENCOUNTER — Inpatient Hospital Stay (HOSPITAL_COMMUNITY): Payer: 59

## 2023-03-28 DIAGNOSIS — A419 Sepsis, unspecified organism: Secondary | ICD-10-CM

## 2023-03-28 LAB — COMPREHENSIVE METABOLIC PANEL
ALT: 17 U/L (ref 0–44)
AST: 43 U/L — ABNORMAL HIGH (ref 15–41)
Albumin: 3.2 g/dL — ABNORMAL LOW (ref 3.5–5.0)
Alkaline Phosphatase: 85 U/L (ref 38–126)
Anion gap: 9 (ref 5–15)
BUN: 28 mg/dL — ABNORMAL HIGH (ref 8–23)
CO2: 21 mmol/L — ABNORMAL LOW (ref 22–32)
Calcium: 8.5 mg/dL — ABNORMAL LOW (ref 8.9–10.3)
Chloride: 97 mmol/L — ABNORMAL LOW (ref 98–111)
Creatinine, Ser: 0.78 mg/dL (ref 0.61–1.24)
GFR, Estimated: >60 mL/min (ref >60)
Glucose, Bld: 171 mg/dL — ABNORMAL HIGH (ref 70–99)
Potassium: 4 mmol/L (ref 3.5–5.1)
Sodium: 127 mmol/L — ABNORMAL LOW (ref 135–145)
Total Bilirubin: 3.9 mg/dL — ABNORMAL HIGH (ref 0.3–1.2)
Total Protein: 5.2 g/dL — ABNORMAL LOW (ref 6.5–8.1)

## 2023-03-28 LAB — CULTURE, BLOOD (ROUTINE X 2): Special Requests: ADEQUATE

## 2023-03-28 LAB — GLUCOSE, CAPILLARY
Glucose-Capillary: 170 mg/dL — ABNORMAL HIGH (ref 70–99)
Glucose-Capillary: 115 mg/dL — ABNORMAL HIGH (ref 70–99)
Glucose-Capillary: 134 mg/dL — ABNORMAL HIGH (ref 70–99)
Glucose-Capillary: 104 mg/dL — ABNORMAL HIGH (ref 70–99)
Glucose-Capillary: 133 mg/dL — ABNORMAL HIGH (ref 70–99)
Glucose-Capillary: 129 mg/dL — ABNORMAL HIGH (ref 70–99)

## 2023-03-28 LAB — CBC WITH DIFFERENTIAL/PLATELET
Abs Immature Granulocytes: 0.04 10*3/uL (ref 0.00–0.07)
Basophils Absolute: 0.1 10*3/uL (ref 0.0–0.1)
Basophils Relative: 1 %
Eosinophils Absolute: 0.6 10*3/uL — ABNORMAL HIGH (ref 0.0–0.5)
Eosinophils Relative: 7 %
HCT: 26.6 % — ABNORMAL LOW (ref 39.0–52.0)
Hemoglobin: 8.9 g/dL — ABNORMAL LOW (ref 13.0–17.0)
Immature Granulocytes: 1 %
Lymphocytes Relative: 11 %
Lymphs Abs: 0.9 10*3/uL (ref 0.7–4.0)
MCH: 32.7 pg (ref 26.0–34.0)
MCHC: 33.5 g/dL (ref 30.0–36.0)
MCV: 97.8 fL (ref 80.0–100.0)
Monocytes Absolute: 1.4 10*3/uL — ABNORMAL HIGH (ref 0.1–1.0)
Monocytes Relative: 17 %
Neutro Abs: 5.3 10*3/uL (ref 1.7–7.7)
Neutrophils Relative %: 63 %
Platelets: 100 10*3/uL — ABNORMAL LOW (ref 150–400)
RBC: 2.72 MIL/uL — ABNORMAL LOW (ref 4.22–5.81)
RDW: 18.5 % — ABNORMAL HIGH (ref 11.5–15.5)
WBC: 8.3 10*3/uL (ref 4.0–10.5)
nRBC: 0 % (ref 0.0–0.2)

## 2023-03-28 LAB — PROTIME-INR
INR: 1.9 — ABNORMAL HIGH (ref 0.8–1.2)
Prothrombin Time: 22.1 s — ABNORMAL HIGH (ref 11.4–15.2)

## 2023-03-28 LAB — MAGNESIUM: Magnesium: 2.4 mg/dL (ref 1.7–2.4)

## 2023-03-28 LAB — CYTOLOGY - NON PAP

## 2023-03-28 MED ORDER — LIDOCAINE 5 % EX PTCH
1.0000 | MEDICATED_PATCH | Freq: Once | CUTANEOUS | Status: AC
  Administered 2023-03-28: 1 via TRANSDERMAL
  Filled 2023-03-28: qty 1

## 2023-03-28 MED ORDER — ACETAMINOPHEN 325 MG PO TABS
325.0000 mg | ORAL_TABLET | Freq: Once | ORAL | Status: AC
  Administered 2023-03-28: 325 mg via ORAL
  Filled 2023-03-28: qty 1

## 2023-03-28 MED ORDER — LIDOCAINE 5 % EX PTCH
1.0000 | MEDICATED_PATCH | CUTANEOUS | Status: DC
  Administered 2023-03-28 – 2023-03-29 (×2): 1 via TRANSDERMAL
  Filled 2023-03-28 (×2): qty 1

## 2023-03-28 MED ORDER — LIDOCAINE HCL 1 % IJ SOLN
INTRAMUSCULAR | Status: AC
  Filled 2023-03-28: qty 20

## 2023-03-28 NOTE — Progress Notes (Signed)
PROGRESS NOTE    Nathan Roberts  ZOX:[id number] DOB: 1960-12-02 DOA: 03/21/2023 PCP: Clayborne Dana, NP   Brief Narrative:  This 62 yrs old Male with PMH significant of decompensated cirrhosis, anxiety, chronic anemia-iron deficiency, esophageal varices, history of alcohol abuse, refractory ascites who presented to the hospital with complaints of a possible fall and abdominal distention due to missing paracentesis. 03/21/23> underwent paracentesis, 10 L removed,  admitted for suspected sepsis.,  Received 1 PRBC. 03/22/23> transferred to ICU, required Levophed, another paracentesis with 4 L removed.  Oncology as well as GI were consulted.  2 PRBC received. 03/23/23> transferred back to [facility].  1 more PRBC given. 03/24/23 > started to have fever in the evening/night. 03/25/23> ongoing fever.  Vancomycin added.  Paracentesis with 6.3 L removed. 03/28/23> paracentesis with 6 L of clear fluid drained.   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 Carrie Mew at [facility]. EGD 10/23 grade 1 and small esophageal varices. TIPS was recommended for the patient outpatient. He take Ciprofloxacin daily for prophylaxis. Supposed to be on lactulose 30 mL 3 times daily. AFP 6.2 August 24. So far has received 4 paracentesis. Treated aggressively with IV albumin. GI consulted. On Protonix. Scheduled for EGD on  8/29. Continue soft diet. Repeat paracentesis completed today, 6 L of clear fluid drained.   Hypothermia, Suspected sepsis with SBP: Now with FUO. He presented with hypothermia, hypotension, end organ damage and electrolyte abnormalities treated as sepsis likely due to SBP. Ascitic fluid negative so far for any bacterial growth. Gram stain does not show any evidence of SBP as well. Has been on Zosyn since admission. Hypothermia has resolved.  Now has developed fever. RVP negative. CT abdomen negative for any other abnormality.  UA unremarkable. MRSA PCR negative.   Blood culture growing staph epidermis in anaerobic bottle and gram-positive rods in both bottles. Added IV vancomycin due to ongoing fever. Repeat cultures have been performed on 8/25 - Negative so far Chest x-ray unremarkable.  Pro-Cal minimally elevated.  Currently on Augmentin.  Later on we will switch to his chronic Cipro. Repeat blood cultures on 8/28 has been negative for 48 hours.  Vancomycin discontinued.   Fall at home: No focal deficit noted on physical exam. CT head C-spine negative for any acute fracture. Monitor for now. PT and OT evaluation.   Acute on chronic iron deficiency anemia due to chronic blood loss: Hemoglobin 11.0  in August. Hemoglobin initially 7.7 on admission CT abdomen negative for retroperitoneal hemorrhage.  S/P 1 PRBC 8/21. 2 PRBC 8/22. 1 PRBC 8/23. Hematology consulted for concern for hemolysis but suspect most likely presentation with GI bleed. Lactulose enema revealed melanotic stool. Patient was on Protonix drip.  Now on Protonix every 12 hours. Patient provided consent for blood transfusion. Transfuse for hemoglobin less than 7 or hemodynamic instability. Scheduled for EGD 8/29.   Hyponatremia. Hyperkalemia > Resolved. Acute kidney injury> Resolved. Prior sodium  131.  On admission 125.  Currently sodium improving 127 Potassium level improved as well. S creatinine at baseline  0.7.  On admission 1.29 After aggressive treatment with IV albumin and blood transfusion, serum creatinine back to baseline. Elevated potassium most likely in the setting of ongoing use of Aldactone. Treated with Kayexalate enema.  Hyponatremia mostly in the setting of poor p.o. intake as well as cirrhosis. Monitor daily.   Acute urinary retention: Required In-N-Out catheterization x 2 and later on had a Foley catheter placed. Unable to initiate Flomax due to hypotension. Given that his thrombocytopenia and risk of bleeding, voiding trial becomes difficult and too risky.   Now that the platelet counts are improving.  Consider Foley catheter removal tomorrow morning.   Hypoglycemia: In the setting of poor p.o. intake. Currently on D10.  On soft diet.   Mild hepatic encephalopathy: Ammonia level elevated. Asterixis present.  Improving. Continue lactulose.   Chronic thrombocytopenia. Worsening, likely dilutional in nature. Hematology following. In the setting of alcohol cirrhosis. Monitor for now.   Hypotension: Suspected hemorrhagic shock versus distributive shock in the setting of hypoalbuminemia. On midodrine.  Briefly required Levophed. Holding home Inderal. Blood pressure improved.   Hyperlipidemia: Holding statin for now.   Umbilical hernia without obstruction and without gangrene Monitor for now.  Currently no evidence of obstruction.   Goals of care conversation. Palliative care consulted. Patient wanted to be full code.  No HCPOA.  Required emergency consent for PICC line.    DVT prophylaxis:  SCDs Code Status: Full code Family Communication:No family at bed side. Disposition Plan:    Status is: Inpatient Remains inpatient appropriate because: Needs continuous inpatient care.   Consultants:  Gastroenterology PCCM  Procedures: PARACENTESIS X 3  Antimicrobials: Anti-infectives (From admission, onward)    Start     Dose/Rate Route Frequency Ordered Stop    03/29/23 0800  ciprofloxacin (CIPRO) tablet 500 mg        500 mg Oral Daily with breakfast 03/27/23 1244     03/27/23 2200  vancomycin (VANCOCIN) IVPB 1000 mg/200 mL premix  Status:  Discontinued        1,000 mg 200 mL/hr over 60 Minutes Intravenous Every 12 hours 03/27/23 0936 03/28/23 1145   03/26/23 2200  amoxicillin-clavulanate (AUGMENTIN) 875-125 MG per tablet 1 tablet        1 tablet Oral Every 12 hours 03/26/23 1631 03/28/23 0843   03/26/23 1000  vancomycin (VANCOREADY) IVPB 750 mg/150 mL  Status:  Discontinued        750 mg 150 mL/hr over 60 Minutes Intravenous Every 12 hours 03/25/23 2132 03/27/23 0936   03/26/23 0600  vancomycin (VANCOREADY) IVPB 750 mg/150 mL  Status:  Discontinued        750 mg 150 mL/hr over 60 Minutes Intravenous Every 12 hours 03/25/23 1717 03/25/23 2132   03/25/23 2145  vancomycin (VANCOCIN) IVPB 1000 mg/200 mL premix        1,000 mg 200 mL/hr over 60 Minutes Intravenous  Once 03/25/23 2132 03/26/23 0046   03/25/23 1800  vancomycin (VANCOREADY) IVPB 1750 mg/350 mL  Status:  Discontinued        1,750 mg 175 mL/hr over 120 Minutes Intravenous  Once 03/25/23 1701 03/28/23 1145   03/22/23 2000  vancomycin (VANCOREADY) IVPB 1250 mg/250 mL  Status:  Discontinued        1,250 mg 166.7 mL/hr over 90 Minutes Intravenous Every 24 hours 03/21/23 1942 03/22/23 0810   03/21/23 1845  piperacillin-tazobactam (ZOSYN)  IVPB 3.375 g  Status:  Discontinued        3.375 g 12.5 mL/hr over 240 Minutes Intravenous Every 8 hours 03/21/23 1836 03/26/23 1630   03/21/23 1845  vancomycin (VANCOREADY) IVPB 2000 mg/400 mL        2,000 mg 200 mL/hr over 120 Minutes Intravenous  Once 03/21/23 1836 03/21/23 2207      Subjective: Patient was seen and examined at bedside.  Overnight events noted.  Patient underwent paracentesis,  6 L of clear fluid drained.  Tolerated well.  He reports feels slightly better.  Objective: Vitals:   03/28/23 1034 03/28/23 1048 03/28/23 1053 03/28/23  1313  BP: 105/72 104/71 108/66 106/62  Pulse:    79  Resp:    20  Temp:    98 F (36.7 C)  TempSrc:    Oral  SpO2:    95%  Weight:      Height:        Intake/Output Summary (Last 24 hours) at 03/28/2023 1422 Last data filed at 03/28/2023 0618 Gross per 24 hour  Intake 1175 ml  Output 300 ml  Net 875 ml   Filed Weights   03/26/23 0432 03/27/23 0600 03/28/23 0500  Weight: 83.9 kg 83.9 kg 83.6 kg    Examination:  General exam: Appears calm and comfortable, deconditioned, not in any acute distress. Respiratory system: Clear to auscultation. Respiratory effort normal. RR 15 Cardiovascular system: S1 & S2 heard, regular rate and rhythm, no murmur.  No pedal edema. Gastrointestinal system: Abdomen is soft, mildly distended, non tender,  normal bowel sounds heard. Central nervous system: Alert and oriented x3 . No focal neurological deficits. Extremities: Edema+, no cyanosis, no clubbing Skin: No rashes, lesions or ulcers Psychiatry: Judgement and insight appear normal. Mood & affect appropriate.     Data Reviewed: I have personally reviewed following labs and imaging studies  CBC: Recent Labs  Lab 03/22/23 0855 03/22/23 1540 03/24/23 0514 03/25/23 0942 03/26/23 0443 03/27/23 0525 03/28/23 0413  WBC 6.4   < > 7.2 9.2 10.4 10.5 8.3  NEUTROABS 4.5  --   --  6.5 7.4 7.0 5.3  HGB 5.5*   < > 9.0* 9.7* 9.1* 9.0* 8.9*  HCT 17.0*   < > 26.7* 28.8* 27.2* 26.7* 26.6*  MCV 100.6*   < > 96.4 96.6 95.8 96.0 97.8  PLT 52*   < > 70* 73* 69* 84* 100*   < > = values in this interval not displayed.   Basic Metabolic Panel: Recent Labs  Lab 03/22/23 0855 03/22/23 1540 03/25/23 0537 03/25/23 0942 03/26/23 0443 03/27/23 0525 03/28/23 0413  NA  --    < > 126* 126* 126* 124* 127*  K  --    < > 3.4* 3.3* 3.8 3.9 4.0  CL  --    < > 98 95* 98 98 97*  CO2  --    < > 22 22 22 22  21*  GLUCOSE  --    < > 129* 135* 123* 123* 171*  BUN  --    < > 22 20 20 23  28*  CREATININE  --    < >  0.74 0.68 0.61 0.61 0.78  CALCIUM  --    < > 8.1* 8.1* 8.3* 8.2* 8.5*  MG 2.1  --   --   --  2.3 2.5* 2.4  PHOS 2.5  --   --   --   --   --   --    < > =  values in this interval not displayed.   GFR: Estimated Creatinine Clearance: 98.3 mL/min (by C-G formula based on SCr of 0.78 mg/dL). Liver Function Tests: Recent Labs  Lab 03/24/23 0514 03/25/23 0537 03/26/23 0443 03/27/23 0525 03/28/23 0413  AST 49* 45* 35 40 43*  ALT 21 20 16 17 17   ALKPHOS 56 82 84 87 85  BILITOT 5.4* 4.3* 3.6* 4.0* 3.9*  PROT 4.9* 5.2* 5.4* 5.3* 5.2*  ALBUMIN 3.3* 3.1* 3.7 3.5 3.2*   No results for input(s): "LIPASE", "AMYLASE" in the last 168 hours. Recent Labs  Lab 03/23/23 0543 03/24/23 0514 03/25/23 0537  AMMONIA 70* 52* 44*   Coagulation Profile: Recent Labs  Lab 03/22/23 0306 03/23/23 0543 03/24/23 0514 03/25/23 0537 03/28/23 0413  INR 2.5* 2.7* 2.5* 2.4* 1.9*   Cardiac Enzymes: No results for input(s): "CKTOTAL", "CKMB", "CKMBINDEX", "TROPONINI" in the last 168 hours. BNP (last 3 results) No results for input(s): "PROBNP" in the last 8760 hours. HbA1C: No results for input(s): "HGBA1C" in the last 72 hours. CBG: Recent Labs  Lab 03/27/23 2046 03/27/23 2353 03/28/23 0411 03/28/23 0744 03/28/23 1314  GLUCAP 104* 131* 170* 115* 134*   Lipid Profile: No results for input(s): "CHOL", "HDL", "LDLCALC", "TRIG", "CHOLHDL", "LDLDIRECT" in the last 72 hours. Thyroid Function Tests: No results for input(s): "TSH", "T4TOTAL", "FREET4", "T3FREE", "THYROIDAB" in the last 72 hours. Anemia Panel: No results for input(s): "VITAMINB12", "FOLATE", "FERRITIN", "TIBC", "IRON", "RETICCTPCT" in the last 72 hours. Sepsis Labs: Recent Labs  Lab 03/22/23 0306 03/22/23 0735 03/26/23 0443  PROCALCITON  --   --  0.64  LATICACIDVEN 3.1* 2.9*  --     Recent Results (from the past 240 hour(s))  SARS Coronavirus 2 by RT PCR (hospital order, performed in [facility] hospital lab) *cepheid single  result test* Anterior Nasal Swab     Status: None   Collection Time: 03/21/23  4:29 PM   Specimen: Anterior Nasal Swab  Result Value Ref Range Status   SARS Coronavirus 2 by RT PCR NEGATIVE NEGATIVE Final    Comment: (NOTE) SARS-CoV-2 target nucleic acids are NOT DETECTED.  The SARS-CoV-2 RNA is generally detectable in upper and lower respiratory specimens during the acute phase of infection. The lowest concentration of SARS-CoV-2 viral copies this assay can detect is 250 copies / mL. A negative result does not preclude SARS-CoV-2 infection and should not be used as the sole basis for treatment or other patient management decisions.  A negative result may occur with improper specimen collection / handling, submission of specimen other than nasopharyngeal swab, presence of viral mutation(s) within the areas targeted by this assay, and inadequate number of viral copies (<250 copies / mL). A negative result must be combined with clinical observations, patient history, and epidemiological information.  Fact Sheet for Patients:   RoadLapTop.co.za  Fact Sheet for Healthcare Providers: http://kim-miller.com/  This test is not yet approved or  cleared by the [place] FDA and has been authorized for detection and/or diagnosis of SARS-CoV-2 by FDA under an Emergency Use Authorization (EUA).  This EUA will remain in effect (meaning this test can be used) for the duration of the COVID-19 declaration under Section 564(b)(1) of the Act, 21 U.S.C. section 360bbb-3(b)(1), unless the authorization is terminated or revoked sooner.  Performed at [facility], [address], [place], [place] [address]   Culture, blood (Routine X 2) w Reflex to ID Panel     Status: Abnormal   Collection Time: 03/21/23  7:32  PM   Specimen: BLOOD LEFT ARM  Result Value Ref Range Status   Specimen Description   Final    BLOOD LEFT ARM Performed  at [facility], [address], [place], [place] [address]    Special Requests   Final    BOTTLES DRAWN AEROBIC AND ANAEROBIC Blood Culture adequate volume Performed at [facility], [address] [place], [place] [address]    Culture  Setup Time   Final    GRAM POSITIVE RODS IN BOTH AEROBIC AND ANAEROBIC BOTTLES CRITICAL RESULT CALLED TO, READ BACK BY AND VERIFIED WITH: PHARMD NICK  GLOGOVAC [id number] FCP    Culture (A)  Final    CORYNEBACTERIUM SPECIES Standardized susceptibility testing for this organism is not available. Performed at [facility], [address], [place], [place] [address]    Report Status 03/25/2023 FINAL  Final  Culture, blood (Routine X 2) w Reflex to ID Panel     Status: Abnormal   Collection Time: 03/21/23  7:37 PM   Specimen: BLOOD  Result Value Ref Range Status   Specimen Description   Final    BLOOD SITE NOT SPECIFIED Performed at [facility], [address], [place], [place] [address]    Special Requests   Final    BOTTLES DRAWN AEROBIC AND ANAEROBIC Blood Culture adequate volume Performed at [facility], [address] [place], [place] [address]    Culture  Setup Time   Final    GRAM POSITIVE COCCI ANAEROBIC BOTTLE ONLY Organism ID to follow AEROBIC BOTTLE ONLY GRAM POSITIVE RODS CRITICAL VALUE NOTED.  VALUE IS CONSISTENT WITH PREVIOUSLY REPORTED AND CALLED VALUE.    Culture (A)  Final    STAPHYLOCOCCUS EPIDERMIDIS CRITICAL RESULT CALLED TO, READ BACK BY AND VERIFIED WITH: PHARMD E. Cathren Harsh @2229  FH THE SIGNIFICANCE OF ISOLATING THIS ORGANISM FROM A SINGLE SET OF BLOOD CULTURES WHEN MULTIPLE SETS ARE DRAWN IS UNCERTAIN. PLEASE NOTIFY THE MICROBIOLOGY DEPARTMENT WITHIN ONE WEEK IF SPECIATION AND SENSITIVITIES ARE REQUIRED. DIPHTHEROIDS(CORYNEBACTERIUM SPECIES) Standardized susceptibility testing for this organism is not available. Performed at [facility], [address] [place], [place] [address]    Report Status 03/28/2023 FINAL  Final  Blood Culture ID Panel (Reflexed)     Status: Abnormal   Collection Time: 03/21/23  7:37 PM  Result Value Ref Range Status   Enterococcus faecalis NOT DETECTED NOT DETECTED Final   Enterococcus Faecium NOT DETECTED NOT DETECTED Final   Listeria monocytogenes NOT DETECTED NOT DETECTED Final   Staphylococcus species DETECTED (A) NOT DETECTED Final    Comment: CRITICAL RESULT CALLED TO, READ BACK BY AND VERIFIED WITH: PHARMD E. Jean Rosenthal [id number] @2229  FH    Staphylococcus aureus (BCID) NOT DETECTED NOT DETECTED Final   Staphylococcus epidermidis DETECTED (A) NOT DETECTED Final    Comment: CRITICAL RESULT CALLED TO, READ BACK BY AND VERIFIED WITH: PHARMD ECathren Harsh @2229  FH    Staphylococcus lugdunensis NOT DETECTED NOT DETECTED Final   Streptococcus species NOT DETECTED NOT DETECTED Final   Streptococcus agalactiae NOT DETECTED NOT DETECTED Final   Streptococcus pneumoniae NOT DETECTED NOT DETECTED Final   Streptococcus pyogenes NOT DETECTED NOT DETECTED Final   A.calcoaceticus-baumannii NOT DETECTED NOT DETECTED Final   Bacteroides fragilis NOT DETECTED NOT DETECTED Final   Enterobacterales NOT DETECTED NOT DETECTED Final   Enterobacter cloacae complex NOT DETECTED NOT DETECTED Final   Escherichia coli NOT DETECTED NOT DETECTED Final  Klebsiella aerogenes NOT DETECTED NOT DETECTED Final   Klebsiella oxytoca NOT DETECTED NOT DETECTED Final   Klebsiella pneumoniae NOT DETECTED NOT DETECTED Final   Proteus species NOT DETECTED NOT DETECTED Final   Salmonella species NOT DETECTED NOT DETECTED Final   Serratia marcescens NOT DETECTED NOT DETECTED Final   Haemophilus influenzae NOT DETECTED NOT DETECTED Final   Neisseria meningitidis NOT DETECTED NOT DETECTED Final   Pseudomonas aeruginosa NOT DETECTED NOT DETECTED Final   Stenotrophomonas maltophilia NOT DETECTED NOT DETECTED Final   Candida albicans NOT  DETECTED NOT DETECTED Final   Candida auris NOT DETECTED NOT DETECTED Final   Candida glabrata NOT DETECTED NOT DETECTED Final   Candida krusei NOT DETECTED NOT DETECTED Final   Candida parapsilosis NOT DETECTED NOT DETECTED Final   Candida tropicalis NOT DETECTED NOT DETECTED Final   Cryptococcus neoformans/gattii NOT DETECTED NOT DETECTED Final   Methicillin resistance mecA/C NOT DETECTED NOT DETECTED Final    Comment: Performed at [facility], [address], [place], [place] [address]  Peritoneal fluid culture w Gram Stain     Status: None   Collection Time: 03/22/23 11:46 AM   Specimen: Peritoneal Washings; Peritoneal Fluid  Result Value Ref Range Status   Specimen Description   Final    PERITONEAL Performed at [facility], [address], [place], [place] [address]    Special Requests   Final    NONE Performed at [facility], [address], [place], [place] [address]    Gram Stain   Final    RARE WBC PRESENT,BOTH PMN AND MONONUCLEAR NO ORGANISMS SEEN    Culture   Final    NO GROWTH 3 DAYS Performed at [facility], [address], [place], [place] [address]    Report Status 03/25/2023 FINAL  Final  Respiratory (~20 pathogens) panel by PCR     Status: None   Collection Time: 03/25/23  9:44 AM   Specimen: Nasopharyngeal Swab; Respiratory  Result Value Ref Range Status   Adenovirus NOT DETECTED NOT DETECTED Final   Coronavirus 229E NOT DETECTED NOT DETECTED Final    Comment: (NOTE) The Coronavirus on the Respiratory Panel, DOES NOT test for the novel  Coronavirus (2019 nCoV)    Coronavirus HKU1 NOT DETECTED NOT DETECTED Final   Coronavirus NL63 NOT DETECTED NOT DETECTED Final   Coronavirus OC43 NOT DETECTED NOT DETECTED Final   Metapneumovirus NOT DETECTED NOT DETECTED Final   Rhinovirus / Enterovirus NOT DETECTED NOT DETECTED Final   Influenza A NOT DETECTED NOT DETECTED Final   Influenza B NOT DETECTED NOT  DETECTED Final   Parainfluenza Virus 1 NOT DETECTED NOT DETECTED Final   Parainfluenza Virus 2 NOT DETECTED NOT DETECTED Final   Parainfluenza Virus 3 NOT DETECTED NOT DETECTED Final   Parainfluenza Virus 4 NOT DETECTED NOT DETECTED Final   Respiratory Syncytial Virus NOT DETECTED NOT DETECTED Final   Bordetella pertussis NOT DETECTED NOT DETECTED Final   Bordetella Parapertussis NOT DETECTED NOT DETECTED Final   Chlamydophila pneumoniae NOT DETECTED NOT DETECTED Final   Mycoplasma pneumoniae NOT DETECTED NOT DETECTED Final    Comment: Performed at [facility], [address] [place], [place] [address]  Culture, blood (Routine X 2) w Reflex to ID Panel     Status: None (Preliminary result)   Collection Time: 03/25/23 11:08 AM   Specimen: BLOOD  Result Value Ref Range Status   Specimen Description   Final  BLOOD RIGHT ANTECUBITAL Performed at [facility], [address], [place], [place] [address]    Special Requests   Final    BOTTLES DRAWN AEROBIC AND ANAEROBIC Blood Culture adequate volume Performed at [facility], [address], [place], [place] [address]    Culture   Final    NO GROWTH 3 DAYS Performed at [facility], [address] [place], [place] [address]    Report Status PENDING  Incomplete  Culture, blood (Routine X 2) w Reflex to ID Panel     Status: None (Preliminary result)   Collection Time: 03/25/23 11:08 AM   Specimen: BLOOD  Result Value Ref Range Status   Specimen Description   Final    BLOOD LEFT ANTECUBITAL Performed at [facility], [address] [place], [place] [address]    Special Requests   Final    BOTTLES DRAWN AEROBIC AND ANAEROBIC Blood Culture adequate volume Performed at [facility], [address] [place], [place] [address]    Culture   Final    NO GROWTH 3 DAYS Performed at [facility], [address] [place], [place] [address]    Report Status PENDING   Incomplete  Culture, body fluid w Gram Stain-bottle     Status: None (Preliminary result)   Collection Time: 03/25/23  1:24 PM   Specimen: Peritoneal Washings  Result Value Ref Range Status   Specimen Description PERITONEAL  Final   Special Requests NONE  Final   Culture   Final    NO GROWTH 3 DAYS Performed at [facility], [address], [place], [place] [address]    Report Status PENDING  Incomplete  Gram stain     Status: None   Collection Time: 03/25/23  1:24 PM   Specimen: Peritoneal Washings  Result Value Ref Range Status   Specimen Description PERITONEAL  Final   Special Requests NONE  Final   Gram Stain   Final    NO ORGANISMS SEEN Performed at [facility], [address] [place], [place] [address]    Report Status 03/25/2023 FINAL  Final         Radiology Studies: US Paracentesis  Result Date: 03/28/2023 INDICATION: Patient with history of ETOH cirrhosis, recurrent ascites. Request for therapeutic paracentesis. EXAM: ULTRASOUND GUIDED THERAPEUTIC PARACENTESIS MEDICATIONS: 6 mL 1% lidocaine COMPLICATIONS: None immediate. PROCEDURE: Informed written consent was obtained from the patient after a discussion of the risks, benefits and alternatives to treatment. A timeout was performed prior to the initiation of the procedure. Initial ultrasound scanning demonstrates a large amount of ascites within the right lower abdominal quadrant. The right lower abdomen was prepped and draped in the usual sterile fashion. 1% lidocaine was used for local anesthesia. Following this, a 19 gauge, 7-cm, Yueh catheter was introduced. An ultrasound image was saved for documentation purposes. The paracentesis was performed. The catheter was removed and a dressing was applied. The patient tolerated the procedure well without immediate post procedural complication. FINDINGS: A total of approximately 6.0 L  of clear, yellow fluid was removed. IMPRESSION: Successful ultrasound-guided  paracentesis yielding 6.0 liters of peritoneal fluid. Performed by Lynnette Caffey, PA-C PLAN: The patient has required >/= 2 paracenteses in a 30 day period and a screening evaluation by the [facility] could be considered however per patient and chart review he is followed by [facility] and is in discussions with  [facility] for potential TIPS. Patient currently declining TIPS at this time due to procedure risks. Electronically Signed   By: Judie Petit.  Shick M.D.   On: 03/28/2023 12:09    Scheduled Meds:  Chlorhexidine Gluconate Cloth  6 each Topical Daily   [START ON 03/29/2023] ciprofloxacin  500 mg Oral Q breakfast   feeding supplement  1 Container Oral TID BM   lactulose  20 g Oral TID   midodrine  10 mg Oral TID PC   multivitamin with minerals  1 tablet Oral Daily   pantoprazole  40 mg Oral BID AC   simethicone  80 mg Oral QID   sodium chloride flush  10-40 mL Intracatheter Q12H   sodium chloride flush  3 mL Intravenous Q12H   sucralfate  1 g Oral BID   Continuous Infusions:  sodium chloride Stopped (03/25/23 0227)     LOS: 7 days    Time spent: 50 Mins    Willeen Niece, MD [facility]   If 7PM-7AM, please contact night-coverage

## 2023-03-28 NOTE — TOC Progression Note (Signed)
Transition of Care [facility]) - Progression Note    Patient Details  Name: Nathan Roberts MRN: [id number] Date of Birth: 1960-09-07  [facility]) CM/SW Contact  Larrie Kass, LCSW Phone Number: 03/28/2023, 3:16 PM  Clinical Narrative:    CSW met with pt and discussed the SNF placement recommendation. Pt has declined SNF placement, and stated "I want to go home." CSW informed pt HH can be arranged if he would like. Pt has agreed to home health services. Pt reports no DME or transportation needs. Pt reports driving himself to the hospital. N[facility] Labella has accepted pt for HHPT/OT but will need HH orders MD made aware.    Expected Discharge Plan: Home w Home Health Services Barriers to Discharge: Continued Medical Work up  Expected Discharge Plan and Services In-house Referral: Clinical Social Work     Living arrangements for the past 2 months: Single Family Home                           HH Arranged: PT, OT HH Agency: [facility] Date [facility] Agency Contacted: 03/28/23 Time HH Agency Contacted: 1516 Representative spoke with at [facility] Agency: amy   Social Determinants of Health (SDOH) Interventions SDOH Screenings   Food Insecurity: Food Insecurity Present (03/21/2023)  Housing: Low Risk  (03/21/2023)  Transportation Needs: Unmet Transportation Needs (03/21/2023)  Utilities: Not At Risk (03/21/2023)  Depression (PHQ2-9): Medium Risk (12/22/2022)  Social Connections: Unknown (12/09/2021)   [facility] from [facility]  Tobacco Use: Medium Risk (03/21/2023)    Readmission Risk Interventions     No data to display

## 2023-03-28 NOTE — TOC Initial Note (Signed)
Transition of Care [facility]) - Initial/Assessment Note    Patient Details  Name: Nathan Roberts MRN: [id number] Date of Birth: Oct 17, 1960  Transition of Care [facility]) CM/SW Contact:    Larrie Kass, LCSW Phone Number: 03/28/2023, 10:56 AM  Clinical Narrative:                 Attempted to see pt about SNF rec, pt not in room. CSW will speak with pt at a later time. TOC to follow.         Patient Goals and CMS Choice            Expected Discharge Plan and Services                                              Prior Living Arrangements/Services                       Activities of Daily Living Home Assistive Devices/Equipment: None ADL Screening (condition at time of admission) Patient's cognitive ability adequate to safely complete daily activities?: Yes Is the patient deaf or have difficulty hearing?: No Does the patient have difficulty seeing, even when wearing glasses/contacts?: No Does the patient have difficulty concentrating, remembering, or making decisions?: No Patient able to express need for assistance with ADLs?: Yes Does the patient have difficulty dressing or bathing?: No Independently performs ADLs?: Yes (appropriate for developmental age) Does the patient have difficulty walking or climbing stairs?: No Weakness of Legs: None Weakness of Arms/Hands: None  Permission Sought/Granted                  Emotional Assessment              Admission diagnosis:  Hypothermia [T68.XXXA] Weakness [R53.1] Patient Active Problem List   Diagnosis Date Noted   Protein-calorie malnutrition, severe 03/27/2023   Sepsis (HCC) 03/25/2023   Pancreas divisum 03/22/2023   Hypothermia 03/21/2023   Ascites due to alcoholic cirrhosis (HCC) 02/20/2023   Decompensated hepatic cirrhosis (HCC) 01/11/2023   Anxiety and depression 12/22/2022   Prediabetes 12/22/2022   Iron deficiency anemia due to chronic blood loss    Umbilical hernia  without obstruction and without gangrene 03/23/2022   Hepatic cirrhosis (HCC)    Portal hypertensive gastropathy (HCC) 10/08/2020   Esophageal varices (HCC) 10/08/2020   Upper GI bleed 10/07/2020   Alcohol use 10/07/2020   Deficiency anemia 10/06/2020   Bilateral lower extremity edema 10/06/2020   Hemoglobinuria 11/29/2017   Elevated hemoglobin A1c 11/27/2017   Transaminitis 11/27/2017   Elevated LFTs 02/02/2014   Family history of lung cancer 01/07/2014   Hyperlipidemia 12/18/2013   Allergic rhinitis 12/18/2013   GERD (gastroesophageal reflux disease) 12/18/2013   Generalized anxiety disorder 12/18/2013   Screening for colorectal cancer 12/18/2013   Chronic sinusitis 02/23/2012   Persistent headaches 02/23/2012   Cervical disc disease 11/01/2011   Hypertension 11/01/2011   PCP:  Clayborne Dana, NP Pharmacy:   [facility] [phone] - [place], [place] - [address] [place] [address] Phone: [phone] Fax: [phone]  [id number] - [place] [facility] [address] [place] [address] Phone: [phone] Fax: [phone]     Social Determinants of Health (SDOH) Social History: SDOH Screenings   Food Insecurity: Food Insecurity Present (03/21/2023)  Housing: Low  Risk  (03/21/2023)  Transportation Needs: Unmet Transportation Needs (03/21/2023)  Utilities: Not At Risk (03/21/2023)  Depression (PHQ2-9): Medium Risk (12/22/2022)  Social Connections: Unknown (12/09/2021)   Received from [facility]  Tobacco Use: Medium Risk (03/21/2023)   SDOH Interventions:     Readmission Risk Interventions     No data to display

## 2023-03-28 NOTE — Procedures (Signed)
PROCEDURE SUMMARY:  Successful ultrasound guided paracentesis from the right lower quadrant.  Yielded 6.0 L of clear yellow fluid.  No immediate complications.  The patient tolerated the procedure well.   Specimen was not sent for labs.  EBL < 5mL  The patient has required >/= 2 paracenteses in a 30 day period and a screening evaluation by the [facility] could be considered however per patient and chart review he is followed by [facility] and is in discussions with [facility] IR for potential TIPS. Patient currently declining TIPS at this time due to procedure risks.  Lynnette Caffey, PA-C

## 2023-03-29 ENCOUNTER — Inpatient Hospital Stay (HOSPITAL_COMMUNITY): Payer: 59 | Admitting: Anesthesiology

## 2023-03-29 ENCOUNTER — Encounter (HOSPITAL_COMMUNITY): Payer: Self-pay | Admitting: Internal Medicine

## 2023-03-29 ENCOUNTER — Encounter (HOSPITAL_COMMUNITY)
Admission: EM | Disposition: A | Discharge status: Home Health | Payer: Self-pay | Source: Home / Self Care | Attending: Internal Medicine

## 2023-03-29 DIAGNOSIS — I10 Essential (primary) hypertension: Secondary | ICD-10-CM

## 2023-03-29 DIAGNOSIS — A419 Sepsis, unspecified organism: Secondary | ICD-10-CM | POA: Diagnosis not present

## 2023-03-29 DIAGNOSIS — I8500 Esophageal varices without bleeding: Secondary | ICD-10-CM | POA: Diagnosis not present

## 2023-03-29 DIAGNOSIS — Z87891 Personal history of nicotine dependence: Secondary | ICD-10-CM | POA: Diagnosis not present

## 2023-03-29 DIAGNOSIS — F418 Other specified anxiety disorders: Secondary | ICD-10-CM

## 2023-03-29 HISTORY — PX: ESOPHAGOGASTRODUODENOSCOPY: SHX5428

## 2023-03-29 LAB — COMPREHENSIVE METABOLIC PANEL
ALT: 19 U/L (ref 0–44)
AST: 47 U/L — ABNORMAL HIGH (ref 15–41)
Albumin: 3.2 g/dL — ABNORMAL LOW (ref 3.5–5.0)
Alkaline Phosphatase: 92 U/L (ref 38–126)
Anion gap: 9 (ref 5–15)
BUN: 25 mg/dL — ABNORMAL HIGH (ref 8–23)
CO2: 21 mmol/L — ABNORMAL LOW (ref 22–32)
Calcium: 8.4 mg/dL — ABNORMAL LOW (ref 8.9–10.3)
Chloride: 99 mmol/L (ref 98–111)
Creatinine, Ser: 0.7 mg/dL (ref 0.61–1.24)
GFR, Estimated: >60 mL/min (ref >60)
Glucose, Bld: 125 mg/dL — ABNORMAL HIGH (ref 70–99)
Potassium: 4.2 mmol/L (ref 3.5–5.1)
Sodium: 129 mmol/L — ABNORMAL LOW (ref 135–145)
Total Bilirubin: 4.7 mg/dL — ABNORMAL HIGH (ref 0.3–1.2)
Total Protein: 5.6 g/dL — ABNORMAL LOW (ref 6.5–8.1)

## 2023-03-29 LAB — CBC
HCT: 28.2 % — ABNORMAL LOW (ref 39.0–52.0)
Hemoglobin: 10 g/dL — ABNORMAL LOW (ref 13.0–17.0)
MCH: 35 pg — ABNORMAL HIGH (ref 26.0–34.0)
MCHC: 35.5 g/dL (ref 30.0–36.0)
MCV: 98.6 fL (ref 80.0–100.0)
Platelets: 112 10*3/uL — ABNORMAL LOW (ref 150–400)
RBC: 2.86 MIL/uL — ABNORMAL LOW (ref 4.22–5.81)
RDW: 18.9 % — ABNORMAL HIGH (ref 11.5–15.5)
WBC: 8.1 10*3/uL (ref 4.0–10.5)
nRBC: 0 % (ref 0.0–0.2)

## 2023-03-29 LAB — PHOSPHORUS: Phosphorus: 2.7 mg/dL (ref 2.5–4.6)

## 2023-03-29 LAB — GLUCOSE, CAPILLARY
Glucose-Capillary: 105 mg/dL — ABNORMAL HIGH (ref 70–99)
Glucose-Capillary: 108 mg/dL — ABNORMAL HIGH (ref 70–99)
Glucose-Capillary: 119 mg/dL — ABNORMAL HIGH (ref 70–99)
Glucose-Capillary: 137 mg/dL — ABNORMAL HIGH (ref 70–99)
Glucose-Capillary: 143 mg/dL — ABNORMAL HIGH (ref 70–99)
Glucose-Capillary: 151 mg/dL — ABNORMAL HIGH (ref 70–99)

## 2023-03-29 LAB — MAGNESIUM: Magnesium: 2.5 mg/dL — ABNORMAL HIGH (ref 1.7–2.4)

## 2023-03-29 SURGERY — EGD (ESOPHAGOGASTRODUODENOSCOPY)
Anesthesia: Monitor Anesthesia Care

## 2023-03-29 MED ORDER — LIDOCAINE 2% (20 MG/ML) 5 ML SYRINGE
INTRAMUSCULAR | Status: DC | PRN
  Administered 2023-03-29: 80 mg via INTRAVENOUS

## 2023-03-29 MED ORDER — SODIUM CHLORIDE 0.9 % IV SOLN: INTRAVENOUS | Status: DC

## 2023-03-29 MED ORDER — PROPOFOL 500 MG/50ML IV EMUL
INTRAVENOUS | Status: DC | PRN
  Administered 2023-03-29: 125 ug/kg/min via INTRAVENOUS

## 2023-03-29 MED ORDER — PROPOFOL 10 MG/ML IV BOLUS
INTRAVENOUS | Status: DC | PRN
Start: 2023-03-29 — End: 2023-03-29
  Administered 2023-03-29: 20 mg via INTRAVENOUS

## 2023-03-29 MED ORDER — LACTATED RINGERS IV SOLN
INTRAVENOUS | Status: DC
  Administered 2023-03-29: 1000 mL via INTRAVENOUS

## 2023-03-29 MED ORDER — PROPOFOL 500 MG/50ML IV EMUL
INTRAVENOUS | Status: AC
  Filled 2023-03-29: qty 50

## 2023-03-29 NOTE — Anesthesia Preprocedure Evaluation (Addendum)
Anesthesia Evaluation  Patient identified by MRN, date of birth, ID band Patient awake    Reviewed: Allergy & Precautions, NPO status , Patient's Chart, lab work & pertinent test results, reviewed documented beta blocker date and time   Airway Mallampati: III  TM Distance: >3 FB Neck ROM: Full    Dental  (+) Dental Advisory Given, Missing, Chipped,    Pulmonary former smoker   Pulmonary exam normal breath sounds clear to auscultation       Cardiovascular hypertension, Pt. on home beta blockers Normal cardiovascular exam Rhythm:Regular Rate:Normal     Neuro/Psych  Headaches PSYCHIATRIC DISORDERS Anxiety Depression       GI/Hepatic ,GERD  Medicated,,(+) Cirrhosis   Esophageal Varices    Portal hypertensive gastropathy   Endo/Other  diabetes, Type 2, Oral Hypoglycemic Agents    Renal/GU negative Renal ROS     Musculoskeletal  (+) Arthritis ,    Abdominal   Peds  Hematology negative hematology ROS (+)   Anesthesia Other Findings   Reproductive/Obstetrics                             Anesthesia Physical Anesthesia Plan  ASA: 3  Anesthesia Plan: MAC   Post-op Pain Management: Minimal or no pain anticipated   Induction: Intravenous  PONV Risk Score and Plan: 1 and TIVA and Treatment may vary due to age or medical condition  Airway Management Planned: Natural Airway and Simple Face Mask  Additional Equipment:   Intra-op Plan:   Post-operative Plan:   Informed Consent: I have reviewed the patients History and Physical, chart, labs and discussed the procedure including the risks, benefits and alternatives for the proposed anesthesia with the patient or authorized representative who has indicated his/her understanding and acceptance.     Dental advisory given  Plan Discussed with: CRNA  Anesthesia Plan Comments:        Anesthesia Quick Evaluation

## 2023-03-29 NOTE — Plan of Care (Signed)
  Problem: Education: Goal: Ability to describe self-care measures that may prevent or decrease complications (Diabetes Survival Skills Education) will improve Outcome: Progressing   Problem: Coping: Goal: Ability to adjust to condition or change in health will improve Outcome: Progressing   Problem: Health Behavior/Discharge Planning: Goal: Ability to identify and utilize available resources and services will improve Outcome: Progressing   

## 2023-03-29 NOTE — Plan of Care (Signed)
  Problem: Coping: Goal: Ability to adjust to condition or change in health will improve Outcome: Progressing   Problem: Fluid Volume: Goal: Ability to maintain a balanced intake and output will improve Outcome: Progressing   Problem: Health Behavior/Discharge Planning: Goal: Ability to identify and utilize available resources and services will improve Outcome: Progressing   

## 2023-03-29 NOTE — Anesthesia Postprocedure Evaluation (Signed)
Anesthesia Post Note  Patient: Nathan Roberts  Procedure(s) Performed: ESOPHAGOGASTRODUODENOSCOPY (EGD)     Patient location during evaluation: Endoscopy Anesthesia Type: MAC Level of consciousness: oriented, awake and alert and awake Pain management: pain level controlled Vital Signs Assessment: post-procedure vital signs reviewed and stable Respiratory status: spontaneous breathing, nonlabored ventilation, respiratory function stable and patient connected to nasal cannula oxygen Cardiovascular status: blood pressure returned to baseline and stable Postop Assessment: no headache, no backache and no apparent nausea or vomiting Anesthetic complications: no   No notable events documented.  Last Vitals:  Vitals:   03/29/23 0941 03/29/23 1321  BP: 108/69 98/67  Pulse: 75 77  Resp: 19 19  Temp: (!) 36.3 C 36.6 C  SpO2: 95% 97%    Last Pain:  Vitals:   03/29/23 1321  TempSrc: Oral  PainSc:                  Collene Schlichter

## 2023-03-29 NOTE — Plan of Care (Signed)
  Problem: Coping: Goal: Ability to adjust to condition or change in health will improve Outcome: Progressing   Problem: Fluid Volume: Goal: Ability to maintain a balanced intake and output will improve Outcome: Progressing   

## 2023-03-29 NOTE — Brief Op Note (Signed)
03/29/2023  8:39 AM  PATIENT:  Nathan Roberts  62 y.o. male  PRE-OPERATIVE DIAGNOSIS:  Melena, history of varices  POST-OPERATIVE DIAGNOSIS:  portal hypertensive gastropathy, small esophageal varicies, no banding needed, no active bleeding  PROCEDURE:  Procedure(s): ESOPHAGOGASTRODUODENOSCOPY (EGD) (N/A)  SURGEON:  Surgeons and Role:    Lynann Bologna, DO - Primary  Recommendations:  -Diffuse portal hypertensive gastropathy noted in cardia, fundus and gastric body with no signs of active bleeding, though suspect this as cause of previous melena -Small distal esophageal varices flattened by insufflation, no banding indicated -Consider IV iron infusions for treatment of portal hypertensive gastropathy -Needs outpatient follow up with his primary gastroenterologist to discuss TIPS placement -Ok for low sodium diet from GI perspective  -[facility] will sign off and be available should any questions arise  Liliane Shi, DO [facility]

## 2023-03-29 NOTE — Transfer of Care (Signed)
Immediate Anesthesia Transfer of Care Note  Patient: ERIS HALTEMAN  Procedure(s) Performed: ESOPHAGOGASTRODUODENOSCOPY (EGD)  Patient Location: PACU  Anesthesia Type:MAC  Level of Consciousness: awake, alert , and oriented  Airway & Oxygen Therapy: Patient Spontanous Breathing and Patient connected to face mask oxygen  Post-op Assessment: Report given to RN and Post -op Vital signs reviewed and stable  Post vital signs: Reviewed and stable  Last Vitals:  Vitals Value Taken Time  BP    Temp    Pulse 87 03/29/23 0843  Resp 22 03/29/23 0843  SpO2 98 % 03/29/23 0843  Vitals shown include unfiled device data.  Last Pain:  Vitals:   03/29/23 0752  TempSrc: Temporal  PainSc: 0-No pain      Patients Stated Pain Goal: 2 (03/27/23 1420)  Complications: No notable events documented.

## 2023-03-29 NOTE — Progress Notes (Signed)
PT Cancellation Note  Patient Details Name: Nathan Roberts MRN: [id number] DOB: [birth year]   Cancelled Treatment:    Reason Eval/Treat Not Completed: Fatigue/lethargy limiting ability to participate Pt reports he was up at 1-2 am this morning and ambulated and sat in recliner for a few hours.  Pt had procedure this morning.  Pt reports all he would like to do today is rest.   Thomasene Mohair L Payson 03/29/2023, 12:03 PM Paulino Door, DPT Physical Therapist Acute Rehabilitation Services Office: [phone]

## 2023-03-29 NOTE — Op Note (Signed)
[facility] Patient Name: Nathan Roberts Procedure Date: 03/29/2023 MRN: [id number] Attending MD: Liliane Shi DO, DO, [id number] Date of Birth: 1960-08-08 CSN: [id number] Age: 62 Admit Type: Inpatient Procedure:                Upper GI endoscopy Indications:              Melena, Follow-up of esophageal varices Providers:                Liliane Shi DO, DO, Jacquelyn "Jaci" Clelia Croft, RN,                            Doristine Mango, RN, Rozetta Nunnery, Technician Referring MD:              Medicines:                See the Anesthesia note for documentation of the                            administered medications Complications:            No immediate complications. Estimated Blood Loss:     Estimated blood loss: none. Procedure:                Pre-Anesthesia Assessment:                           - ASA Grade Assessment: III - A patient with severe                            systemic disease.                           - The risks and benefits of the procedure and the                            sedation options and risks were discussed with the                            patient. All questions were answered and informed                            consent was obtained.                           After obtaining informed consent, the endoscope was                            passed under direct vision. Throughout the                            procedure, the patient's blood pressure, pulse, and                            oxygen saturations were monitored continuously. The                            [id number]-[id number] ([id number])  Olympus endoscope was introduced                            through the mouth, and advanced to the second part                            of duodenum. The upper GI endoscopy was                            accomplished without difficulty. The patient                            tolerated the procedure well. Scope In: Scope Out: Findings:      Grade I varices  were found in the lower third of the esophagus.      Diffuse portal hypertensive gastropathy was found in the cardia, in the       gastric fundus and in the gastric body.      The examined duodenum was normal. Impression:               - Grade I esophageal varices.                           - Portal hypertensive gastropathy.                           - Normal examined duodenum.                           - No specimens collected. Moderate Sedation:      See the other procedure note for documentation of moderate sedation with       intraservice time. Recommendation:           - Return patient to hospital ward for ongoing care.                           - Low sodium diet.                           - Continue present medications.                           - Treat portal hypertensive gastropathy with iron                            infusions.                           - Recommend follow up with his primary                            gastroenterologist to discuss TIPS placement.                           - [facility] will be available as needed should any  questions arise. Procedure Code(s):        --- Professional ---                           [phone], Esophagogastroduodenoscopy, flexible,                            transoral; diagnostic, including collection of                            specimen(s) by brushing or washing, when performed                            (separate procedure) Diagnosis Code(s):        --- Professional ---                           I85.00, Esophageal varices without bleeding                           K76.6, Portal hypertension                           K31.89, Other diseases of stomach and duodenum                           K92.1, Melena (includes Hematochezia) CPT copyright 2022 American Medical Association. All rights reserved. The codes documented in this report are preliminary and upon coder review may  be revised to meet current  compliance requirements. Dr Liliane Shi, DO Liliane Shi DO, DO 03/29/2023 8:38:59 AM Number of Addenda: 0

## 2023-03-29 NOTE — Interval H&P Note (Signed)
History and Physical Interval Note:  03/29/2023 8:14 AM  Nathan Roberts  has presented today for surgery, with the diagnosis of Melena, history of varices.  The various methods of treatment have been discussed with the patient and family. After consideration of risks, benefits and other options for treatment, the patient has consented to  Procedure(s): ESOPHAGOGASTRODUODENOSCOPY (EGD) (N/A) as a surgical intervention.  The patient's history has been reviewed, patient examined, no change in status, stable for surgery.  I have reviewed the patient's chart and labs.  Questions were answered to the patient's satisfaction.     Lynann Bologna

## 2023-03-29 NOTE — Progress Notes (Signed)
Left forearm skin tear discovered in Endoscopy Recovery. Recovery RN reported wound to floor RN, Charlotte Sanes after cleaning and dressing. Wound was reported as 2cm x2cm L shaped. After cleaning with NS, wound edges were approximated and steri-strips placed with Tegaderm overlay.

## 2023-03-29 NOTE — Progress Notes (Signed)
PROGRESS NOTE    Nathan Roberts  ZOX:[id number] DOB: 1960/11/19 DOA: 03/21/2023 PCP: Clayborne Dana, NP   Brief Narrative:  This 62 yrs old Male with PMH significant of decompensated cirrhosis, anxiety, chronic anemia-iron deficiency, esophageal varices, history of alcohol abuse, refractory ascites who presented to the hospital with complaints of a possible fall and abdominal distention due to missing paracentesis. 03/21/23> underwent paracentesis, 10 L removed,  admitted for suspected sepsis.,  Received 1 PRBC. 03/22/23> transferred to ICU, required Levophed, another paracentesis with 4 L removed.  Oncology as well as GI were consulted.  2 PRBC received. 03/23/23> transferred back to the [facility] service.  1 more PRBC given. 03/24/23 > started to have fever in the evening/night. 03/25/23> ongoing fever.  Vancomycin added.  Paracentesis with 6.3 L removed. 03/28/23> paracentesis with 6 L of clear fluid drained. 03/29/23. > EGD found to have diffuse portal hypertensive gastropathy with no signs of active bleeding.   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 Carrie Mew at [facility]. EGD 10/23 grade 1 and small esophageal varices. TIPS was recommended for the patient outpatient. He take Ciprofloxacin daily for prophylaxis. Supposed to be on lactulose 30 mL 3 times daily. AFP 6.2 August 24. So far has received 4  paracentesis. Treated aggressively with IV albumin. GI consulted. On Protonix. Scheduled for EGD on 8/29. Continue soft diet. Repeat paracentesis completed 8/28, 6 L of clear fluid drained. He underwent EGD which showed diffuse portal hypertensive gastropathy with no signs of active bleeding.   Hypothermia, Suspected sepsis with SBP: Now with FUO. He presented with hypothermia, hypotension, end organ damage and electrolyte abnormalities treated as sepsis likely due to SBP. Ascitic fluid negative so far for any bacterial growth. Gram stain does not show any evidence of SBP as well. Has been on Zosyn since admission. Hypothermia has resolved.  Now has developed fever. RVP negative. CT abdomen negative for any other abnormality.  UA unremarkable. MRSA PCR negative.   Blood culture growing staph epidermis in anaerobic bottle and gram-positive rods in both bottles. Added IV vancomycin due to ongoing fever. Repeat cultures have been performed on 8/25 - Negative so far Chest x-ray unremarkable.  Pro-Cal minimally elevated.  Currently on Augmentin.  Later on we will switch to his chronic Cipro. Repeat blood cultures on 8/28 has been negative for 48 hours.  Vancomycin discontinued.   Fall at home: No focal deficit noted on physical exam. CT head /  C-spine negative for any acute fracture. Monitor for now. PT/OT > SNF   Acute on chronic iron deficiency anemia due to chronic blood loss: Hemoglobin 11.0  in August. Hemoglobin initially 7.7 on admission CT abdomen negative for retroperitoneal hemorrhage.  S/P 1 PRBC 8/21. 2 PRBC 8/22. 1 PRBC 8/23. Hematology consulted for concern for hemolysis but suspect most likely presentation with GI bleed. Lactulose enema revealed melanotic stool. Patient was on Protonix drip.  Now on Protonix every 12 hours.  Patient provided consent for blood transfusion. Transfuse for hemoglobin less than 7 or hemodynamic instability.   Hyponatremia. Hyperkalemia >  Resolved. Acute kidney injury> Resolved. Prior sodium 131.  On admission 125.  Currently sodium improving 127 >129 Potassium level improved as well. S creatinine at baseline 0.7.  On admission 1.29 After aggressive treatment with IV albumin and blood transfusion, serum creatinine back to baseline. Elevated potassium most likely in the setting of ongoing use of Aldactone. Treated with Kayexalate enema.  Hyponatremia mostly in the setting of poor p.o. intake as well as cirrhosis. Monitor daily.   Acute urinary retention: Required In-N-Out catheterization x 2 and later on had a Foley catheter placed. Unable to initiate Flomax due to hypotension. Given that his thrombocytopenia and risk of bleeding, voiding trial becomes difficult and too risky.   Now that the platelet counts are improving.  Consider Foley catheter removal tomorrow morning.   Hypoglycemia: In the setting of poor p.o. intake. Which is now improved.  On soft diet.   Mild hepatic encephalopathy: > Resolved. Ammonia level elevated. Asterixis present.  Improving. Continue lactulose.   Chronic thrombocytopenia. Worsening, likely dilutional in nature. Hematology following. In the setting of alcohol cirrhosis. Monitor for now.   Hypotension: Suspected hemorrhagic shock versus distributive shock in the setting of hypoalbuminemia. On midodrine.  Briefly required Levophed. Holding home Inderal. Blood pressure improved.   Hyperlipidemia: Holding statin for now.   Umbilical hernia without obstruction and without gangrene Monitor for now.  Currently no evidence of obstruction.   Goals of care conversation. Palliative care consulted. Patient wanted to be full code.  No HCPOA.  Required emergency consent for PICC line.    DVT prophylaxis:  SCDs Code Status: Full code Family Communication: No family at bed side. Disposition Plan:    Status is: Inpatient Remains inpatient appropriate because: Needs continuous  inpatient care.   Consultants:  Gastroenterology PCCM  Procedures: PARACENTESIS X 3  Antimicrobials: Anti-infectives (From admission, onward)    Start     Dose/Rate Route Frequency Ordered Stop   03/29/23 0800  ciprofloxacin (CIPRO) tablet 500 mg        500 mg Oral Daily with breakfast 03/27/23 1244     03/27/23 2200  vancomycin (VANCOCIN) IVPB 1000 mg/200 mL premix  Status:  Discontinued        1,000 mg 200 mL/hr over 60 Minutes Intravenous Every 12 hours 03/27/23 0936 03/28/23 1145   03/26/23 2200  amoxicillin-clavulanate (AUGMENTIN) 875-125 MG per tablet 1 tablet        1 tablet Oral Every 12 hours 03/26/23 1631 03/28/23 0843   03/26/23 1000  vancomycin (VANCOREADY) IVPB 750 mg/150 mL  Status:  Discontinued        750 mg 150 mL/hr over 60 Minutes Intravenous Every 12 hours 03/25/23 2132 03/27/23 0936   03/26/23 0600  vancomycin (VANCOREADY) IVPB 750 mg/150 mL  Status:  Discontinued        750 mg 150 mL/hr over 60 Minutes Intravenous Every 12 hours 03/25/23 1717 03/25/23 2132   03/25/23 2145  vancomycin (VANCOCIN) IVPB 1000 mg/200 mL premix        1,000 mg 200 mL/hr over 60 Minutes Intravenous  Once 03/25/23 2132 03/26/23 0046   03/25/23 1800  vancomycin (VANCOREADY) IVPB 1750 mg/350 mL  Status:  Discontinued        1,750 mg 175 mL/hr over 120 Minutes Intravenous  Once 03/25/23 1701 03/28/23 1145   03/22/23 2000  vancomycin (VANCOREADY) IVPB 1250 mg/250 mL  Status:  Discontinued        1,250 mg 166.7 mL/hr over 90 Minutes Intravenous Every 24 hours 03/21/23 1942 03/22/23 0810   03/21/23 1845  piperacillin-tazobactam (ZOSYN) IVPB 3.375 g  Status:  Discontinued        3.375 g 12.5 mL/hr over 240 Minutes Intravenous Every 8 hours 03/21/23 1836 03/26/23 1630   03/21/23 1845  vancomycin (VANCOREADY) IVPB 2000 mg/400 mL        2,000 mg 200 mL/hr over 120 Minutes Intravenous  Once 03/21/23 1836 03/21/23 2207      Subjective: Patient was seen and examined at bedside.  Overnight  events noted.  Patient reports doing better. Patient underwent EGD, he is found to have diffuse portal hypertensive gastropathy  He reports feels slightly better.  Objective: Vitals:   03/29/23 0850 03/29/23 0901 03/29/23 0941 03/29/23 1321  BP: (!) 110/56 (!) 106/59 108/69 98/67  Pulse: 88 85 75 77  Resp: (!) 21 19 19 19   Temp:   (!) 97.4 F (36.3 C) 97.9 F (36.6 C)  TempSrc:   Oral Oral  SpO2: 95% 92% 95% 97%  Weight:      Height:        Intake/Output Summary (Last 24 hours) at 03/29/2023 1449 Last data filed at 03/29/2023 0843 Gross per 24 hour  Intake 790 ml  Output 225 ml  Net 565 ml   Filed Weights   03/27/23 0600 03/28/23 0500 03/29/23 0433  Weight: 83.9 kg 83.6 kg 75.1 kg    Examination:  General exam: Appears calm and comfortable, deconditioned, not in any acute distress. Respiratory system: Clear to auscultation. Respiratory effort normal. RR 15 Cardiovascular system: S1 & S2 heard, regular rate and rhythm, no murmur.  No pedal edema. Gastrointestinal system: Abdomen is soft, mildly distended, non tender,  normal bowel sounds heard. Central nervous system: Alert and oriented x3 . No focal neurological deficits. Extremities: Edema+, no cyanosis, no clubbing Skin: No rashes, lesions or ulcers Psychiatry: Judgement and insight appear normal. Mood & affect appropriate.     Data Reviewed: I have personally reviewed following labs and imaging studies  CBC: Recent Labs  Lab 03/25/23 0942 03/26/23 0443 03/27/23 0525 03/28/23 0413 03/29/23 0354  WBC 9.2 10.4 10.5 8.3 8.1  NEUTROABS 6.5 7.4 7.0 5.3  --   HGB 9.7* 9.1* 9.0* 8.9* 10.0*  HCT 28.8* 27.2* 26.7* 26.6* 28.2*  MCV 96.6 95.8 96.0 97.8 98.6  PLT 73* 69* 84* 100* 112*   Basic Metabolic Panel: Recent Labs  Lab 03/25/23 0942 03/26/23 0443 03/27/23 0525 03/28/23 0413 03/29/23 0354  NA 126* 126* 124* 127* 129*  K 3.3* 3.8 3.9 4.0 4.2  CL 95* 98 98 97* 99  CO2 22 22 22  21* 21*  GLUCOSE 135*  123* 123* 171* 125*  BUN 20 20 23  28* 25*  CREATININE 0.68 0.61 0.61 0.78 0.70  CALCIUM 8.1* 8.3* 8.2* 8.5* 8.4*  MG  --  2.3 2.5* 2.4 2.5*  PHOS  --   --   --   --  2.7   GFR: Estimated Creatinine Clearance: 90.3 mL/min (by C-G formula based on SCr of 0.7 mg/dL). Liver Function Tests: Recent Labs  Lab 03/25/23 0537 03/26/23 0443 03/27/23 0525 03/28/23 0413 03/29/23 0354  AST 45* 35 40 43* 47*  ALT 20 16 17 17 19   ALKPHOS 82 84 87 85 92  BILITOT 4.3* 3.6* 4.0* 3.9* 4.7*  PROT 5.2* 5.4* 5.3* 5.2* 5.6*  ALBUMIN 3.1* 3.7 3.5 3.2* 3.2*  No results for input(s): "LIPASE", "AMYLASE" in the last 168 hours. Recent Labs  Lab 03/23/23 0543 03/24/23 0514 03/25/23 0537  AMMONIA 70* 52* 44*   Coagulation Profile: Recent Labs  Lab 03/23/23 0543 03/24/23 0514 03/25/23 0537 03/28/23 0413  INR 2.7* 2.5* 2.4* 1.9*   Cardiac Enzymes: No results for input(s): "CKTOTAL", "CKMB", "CKMBINDEX", "TROPONINI" in the last 168 hours. BNP (last 3 results) No results for input(s): "PROBNP" in the last 8760 hours. HbA1C: No results for input(s): "HGBA1C" in the last 72 hours. CBG: Recent Labs  Lab 03/28/23 1957 03/28/23 2329 03/29/23 0410 03/29/23 0804 03/29/23 1127  GLUCAP 133* 129* 137* 108* 105*   Lipid Profile: No results for input(s): "CHOL", "HDL", "LDLCALC", "TRIG", "CHOLHDL", "LDLDIRECT" in the last 72 hours. Thyroid Function Tests: No results for input(s): "TSH", "T4TOTAL", "FREET4", "T3FREE", "THYROIDAB" in the last 72 hours. Anemia Panel: No results for input(s): "VITAMINB12", "FOLATE", "FERRITIN", "TIBC", "IRON", "RETICCTPCT" in the last 72 hours. Sepsis Labs: Recent Labs  Lab 03/26/23 0443  PROCALCITON 0.64    Recent Results (from the past 240 hour(s))  SARS Coronavirus 2 by RT PCR (hospital order, performed in [facility] hospital lab) *cepheid single result test* Anterior Nasal Swab     Status: None   Collection Time: 03/21/23  4:29 PM   Specimen: Anterior  Nasal Swab  Result Value Ref Range Status   SARS Coronavirus 2 by RT PCR NEGATIVE NEGATIVE Final    Comment: (NOTE) SARS-CoV-2 target nucleic acids are NOT DETECTED.  The SARS-CoV-2 RNA is generally detectable in upper and lower respiratory specimens during the acute phase of infection. The lowest concentration of SARS-CoV-2 viral copies this assay can detect is 250 copies / mL. A negative result does not preclude SARS-CoV-2 infection and should not be used as the sole basis for treatment or other patient management decisions.  A negative result may occur with improper specimen collection / handling, submission of specimen other than nasopharyngeal swab, presence of viral mutation(s) within the areas targeted by this assay, and inadequate number of viral copies (<250 copies / mL). A negative result must be combined with clinical observations, patient history, and epidemiological information.  Fact Sheet for Patients:   RoadLapTop.co.za  Fact Sheet for Healthcare Providers: http://kim-miller.com/  This test is not yet approved or  cleared by the [place] FDA and has been authorized for detection and/or diagnosis of SARS-CoV-2 by FDA under an Emergency Use Authorization (EUA).  This EUA will remain in effect (meaning this test can be used) for the duration of the COVID-19 declaration under Section 564(b)(1) of the Act, 21 U.S.C. section 360bbb-3(b)(1), unless the authorization is terminated or revoked sooner.  Performed at [facility], [address], [place], [place] [address]   Culture, blood (Routine X 2) w Reflex to ID Panel     Status: Abnormal   Collection Time: 03/21/23  7:32 PM   Specimen: BLOOD LEFT ARM  Result Value Ref Range Status   Specimen Description   Final    BLOOD LEFT ARM Performed at [facility], [address], [place], [place] [address]    Special Requests   Final    BOTTLES  DRAWN AEROBIC AND ANAEROBIC Blood Culture adequate volume Performed at [facility], [address] [place], [place] [address]    Culture  Setup Time   Final    GRAM POSITIVE RODS IN BOTH AEROBIC AND ANAEROBIC BOTTLES CRITICAL RESULT CALLED TO, READ BACK BY AND VERIFIED  WITH: PHARMD NICK  GLOGOVAC 1313 [id number] FCP    Culture (A)  Final    CORYNEBACTERIUM SPECIES Standardized susceptibility testing for this organism is not available. Performed at [facility], [address], [place], [place] [address]    Report Status 03/25/2023 FINAL  Final  Culture, blood (Routine X 2) w Reflex to ID Panel     Status: Abnormal   Collection Time: 03/21/23  7:37 PM   Specimen: BLOOD  Result Value Ref Range Status   Specimen Description   Final    BLOOD SITE NOT SPECIFIED Performed at [facility], [address] [place], [place] [address]    Special Requests   Final    BOTTLES DRAWN AEROBIC AND ANAEROBIC Blood Culture adequate volume Performed at [facility], [address], [place], [place] [address]    Culture  Setup Time   Final    GRAM POSITIVE COCCI ANAEROBIC BOTTLE ONLY Organism ID to follow AEROBIC BOTTLE ONLY GRAM POSITIVE RODS CRITICAL VALUE NOTED.  VALUE IS CONSISTENT WITH PREVIOUSLY REPORTED AND CALLED VALUE.    Culture (A)  Final    STAPHYLOCOCCUS EPIDERMIDIS CRITICAL RESULT CALLED TO, READ BACK BY AND VERIFIED WITH: PHARMD E. Jean Rosenthal [id number] @2229  FH THE SIGNIFICANCE OF ISOLATING THIS ORGANISM FROM A SINGLE SET OF BLOOD CULTURES WHEN MULTIPLE SETS ARE DRAWN IS UNCERTAIN. PLEASE NOTIFY THE MICROBIOLOGY DEPARTMENT WITHIN ONE WEEK IF SPECIATION AND SENSITIVITIES ARE REQUIRED. DIPHTHEROIDS(CORYNEBACTERIUM SPECIES) Standardized susceptibility testing for this organism is not available. Performed at [facility], [address], [place], [place] [address]    Report Status 03/28/2023 FINAL  Final  Blood Culture ID Panel  (Reflexed)     Status: Abnormal   Collection Time: 03/21/23  7:37 PM  Result Value Ref Range Status   Enterococcus faecalis NOT DETECTED NOT DETECTED Final   Enterococcus Faecium NOT DETECTED NOT DETECTED Final   Listeria monocytogenes NOT DETECTED NOT DETECTED Final   Staphylococcus species DETECTED (A) NOT DETECTED Final    Comment: CRITICAL RESULT CALLED TO, READ BACK BY AND VERIFIED WITH: PHARMD ECathren Harsh @2229  FH    Staphylococcus aureus (BCID) NOT DETECTED NOT DETECTED Final   Staphylococcus epidermidis DETECTED (A) NOT DETECTED Final    Comment: CRITICAL RESULT CALLED TO, READ BACK BY AND VERIFIED WITH: PHARMD ECathren Harsh @2229  FH    Staphylococcus lugdunensis NOT DETECTED NOT DETECTED Final   Streptococcus species NOT DETECTED NOT DETECTED Final   Streptococcus agalactiae NOT DETECTED NOT DETECTED Final   Streptococcus pneumoniae NOT DETECTED NOT DETECTED Final   Streptococcus pyogenes NOT DETECTED NOT DETECTED Final   A.calcoaceticus-baumannii NOT DETECTED NOT DETECTED Final   Bacteroides fragilis NOT DETECTED NOT DETECTED Final   Enterobacterales NOT DETECTED NOT DETECTED Final   Enterobacter cloacae complex NOT DETECTED NOT DETECTED Final   Escherichia coli NOT DETECTED NOT DETECTED Final   Klebsiella aerogenes NOT DETECTED NOT DETECTED Final   Klebsiella oxytoca NOT DETECTED NOT DETECTED Final   Klebsiella pneumoniae NOT DETECTED NOT DETECTED Final   Proteus species NOT DETECTED NOT DETECTED Final   Salmonella species NOT DETECTED NOT DETECTED Final   Serratia marcescens NOT DETECTED NOT DETECTED Final   Haemophilus influenzae NOT DETECTED NOT DETECTED Final   Neisseria meningitidis NOT DETECTED NOT DETECTED Final   Pseudomonas aeruginosa NOT DETECTED NOT DETECTED Final   Stenotrophomonas maltophilia NOT DETECTED NOT DETECTED Final   Candida albicans NOT DETECTED NOT DETECTED Final   Candida auris NOT  DETECTED NOT DETECTED Final   Candida glabrata NOT  DETECTED NOT DETECTED Final   Candida krusei NOT DETECTED NOT DETECTED Final   Candida parapsilosis NOT DETECTED NOT DETECTED Final   Candida tropicalis NOT DETECTED NOT DETECTED Final   Cryptococcus neoformans/gattii NOT DETECTED NOT DETECTED Final   Methicillin resistance mecA/C NOT DETECTED NOT DETECTED Final    Comment: Performed at [facility], [address] [place], [place] [address]  Peritoneal fluid culture w Gram Stain     Status: None   Collection Time: 03/22/23 11:46 AM   Specimen: Peritoneal Washings; Peritoneal Fluid  Result Value Ref Range Status   Specimen Description   Final    PERITONEAL Performed at [facility], [address], [place], [place] [address]    Special Requests   Final    NONE Performed at [facility], [address] [place], [place] [address]    Gram Stain   Final    RARE WBC PRESENT,BOTH PMN AND MONONUCLEAR NO ORGANISMS SEEN    Culture   Final    NO GROWTH 3 DAYS Performed at [facility], [address], [place], [place] [address]    Report Status 03/25/2023 FINAL  Final  Respiratory (~20 pathogens) panel by PCR     Status: None   Collection Time: 03/25/23  9:44 AM   Specimen: Nasopharyngeal Swab; Respiratory  Result Value Ref Range Status   Adenovirus NOT DETECTED NOT DETECTED Final   Coronavirus 229E NOT DETECTED NOT DETECTED Final    Comment: (NOTE) The Coronavirus on the Respiratory Panel, DOES NOT test for the novel  Coronavirus (2019 nCoV)    Coronavirus HKU1 NOT DETECTED NOT DETECTED Final   Coronavirus NL63 NOT DETECTED NOT DETECTED Final   Coronavirus OC43 NOT DETECTED NOT DETECTED Final   Metapneumovirus NOT DETECTED NOT DETECTED Final   Rhinovirus / Enterovirus NOT DETECTED NOT DETECTED Final   Influenza A NOT DETECTED NOT DETECTED Final   Influenza B NOT DETECTED NOT DETECTED Final   Parainfluenza Virus 1 NOT DETECTED NOT DETECTED Final   Parainfluenza Virus 2 NOT  DETECTED NOT DETECTED Final   Parainfluenza Virus 3 NOT DETECTED NOT DETECTED Final   Parainfluenza Virus 4 NOT DETECTED NOT DETECTED Final   Respiratory Syncytial Virus NOT DETECTED NOT DETECTED Final   Bordetella pertussis NOT DETECTED NOT DETECTED Final   Bordetella Parapertussis NOT DETECTED NOT DETECTED Final   Chlamydophila pneumoniae NOT DETECTED NOT DETECTED Final   Mycoplasma pneumoniae NOT DETECTED NOT DETECTED Final    Comment: Performed at [facility], [address] [place], [place] [address]  Culture, blood (Routine X 2) w Reflex to ID Panel     Status: None (Preliminary result)   Collection Time: 03/25/23 11:08 AM   Specimen: BLOOD  Result Value Ref Range Status   Specimen Description   Final    BLOOD RIGHT ANTECUBITAL Performed at [facility], [address], [place], [place] [address]    Special Requests   Final    BOTTLES DRAWN AEROBIC AND ANAEROBIC Blood Culture adequate volume Performed at [facility], [address] [place], [place] [address]    Culture   Final    NO GROWTH 4 DAYS Performed at [facility], [address] [place], [place] [address]    Report Status PENDING  Incomplete  Culture, blood (Routine X 2) w Reflex to ID Panel     Status:  None (Preliminary result)   Collection Time: 03/25/23 11:08 AM   Specimen: BLOOD  Result Value Ref Range Status   Specimen Description   Final    BLOOD LEFT ANTECUBITAL Performed at [facility], [address] [place], [place] [address]    Special Requests   Final    BOTTLES DRAWN AEROBIC AND ANAEROBIC Blood Culture adequate volume Performed at [facility], [address] [place], [place] [address]    Culture   Final    NO GROWTH 4 DAYS Performed at [facility], [address], [place], [place] [address]    Report Status PENDING  Incomplete  Culture, body fluid w Gram Stain-bottle     Status: None (Preliminary result)   Collection  Time: 03/25/23  1:24 PM   Specimen: Peritoneal Washings  Result Value Ref Range Status   Specimen Description PERITONEAL  Final   Special Requests NONE  Final   Culture   Final    NO GROWTH 4 DAYS Performed at [facility], [address], [place], [place] [address]    Report Status PENDING  Incomplete  Gram stain     Status: None   Collection Time: 03/25/23  1:24 PM   Specimen: Peritoneal Washings  Result Value Ref Range Status   Specimen Description PERITONEAL  Final   Special Requests NONE  Final   Gram Stain   Final    NO ORGANISMS SEEN Performed at [facility], [address] [place], [place] [address]    Report Status 03/25/2023 FINAL  Final         Radiology Studies: US Paracentesis  Result Date: 03/28/2023 INDICATION: Patient with history of ETOH cirrhosis, recurrent ascites. Request for therapeutic paracentesis. EXAM: ULTRASOUND GUIDED THERAPEUTIC PARACENTESIS MEDICATIONS: 6 mL 1% lidocaine COMPLICATIONS: None immediate. PROCEDURE: Informed written consent was obtained from the patient after a discussion of the risks, benefits and alternatives to treatment. A timeout was performed prior to the initiation of the procedure. Initial ultrasound scanning demonstrates a large amount of ascites within the right lower abdominal quadrant. The right lower abdomen was prepped and draped in the usual sterile fashion. 1% lidocaine was used for local anesthesia. Following this, a 19 gauge, 7-cm, Yueh catheter was introduced. An ultrasound image was saved for documentation purposes. The paracentesis was performed. The catheter was removed and a dressing was applied. The patient tolerated the procedure well without immediate post procedural complication. FINDINGS: A total of approximately 6.0 L  of clear, yellow fluid was removed. IMPRESSION: Successful ultrasound-guided paracentesis yielding 6.0 liters of peritoneal fluid. Performed by Lynnette Caffey, PA-C PLAN: The patient  has required >/= 2 paracenteses in a 30 day period and a screening evaluation by the [facility] could be considered however per patient and chart review he is followed by [facility] and is in discussions with [facility] IR for potential TIPS. Patient currently declining TIPS at this time due to procedure risks. Electronically Signed   By: Judie Petit.  Shick M.D.   On: 03/28/2023 12:09    Scheduled Meds:  Chlorhexidine Gluconate Cloth  6 each Topical Daily   ciprofloxacin  500 mg Oral Q breakfast   feeding supplement  1 Container Oral TID BM   lactulose  20 g Oral TID   lidocaine  1 patch Transdermal Q24H   midodrine  10 mg Oral TID PC   multivitamin with minerals  1 tablet Oral Daily  pantoprazole  40 mg Oral BID AC   simethicone  80 mg Oral QID   sodium chloride flush  10-40 mL Intracatheter Q12H   sodium chloride flush  3 mL Intravenous Q12H   sucralfate  1 g Oral BID   Continuous Infusions:  sodium chloride Stopped (03/25/23 0227)     LOS: 8 days    Time spent: 35  Mins    Willeen Niece, MD [facility]   If 7PM-7AM, please contact night-coverage

## 2023-03-30 DIAGNOSIS — A419 Sepsis, unspecified organism: Secondary | ICD-10-CM | POA: Diagnosis not present

## 2023-03-30 LAB — CULTURE, BLOOD (ROUTINE X 2)
Culture: NO GROWTH
Culture: NO GROWTH
Special Requests: ADEQUATE
Special Requests: ADEQUATE

## 2023-03-30 LAB — CBC
HCT: 26 % — ABNORMAL LOW (ref 39.0–52.0)
Hemoglobin: 9.1 g/dL — ABNORMAL LOW (ref 13.0–17.0)
MCH: 35.5 pg — ABNORMAL HIGH (ref 26.0–34.0)
MCHC: 35 g/dL (ref 30.0–36.0)
MCV: 101.6 fL — ABNORMAL HIGH (ref 80.0–100.0)
Platelets: 115 10*3/uL — ABNORMAL LOW (ref 150–400)
RBC: 2.56 MIL/uL — ABNORMAL LOW (ref 4.22–5.81)
RDW: 20.3 % — ABNORMAL HIGH (ref 11.5–15.5)
WBC: 7.4 10*3/uL (ref 4.0–10.5)
nRBC: 0 % (ref 0.0–0.2)

## 2023-03-30 LAB — GLUCOSE, CAPILLARY
Glucose-Capillary: 133 mg/dL — ABNORMAL HIGH (ref 70–99)
Glucose-Capillary: 118 mg/dL — ABNORMAL HIGH (ref 70–99)
Glucose-Capillary: 181 mg/dL — ABNORMAL HIGH (ref 70–99)

## 2023-03-30 LAB — COMPREHENSIVE METABOLIC PANEL
ALT: 20 U/L (ref 0–44)
AST: 45 U/L — ABNORMAL HIGH (ref 15–41)
Albumin: 2.9 g/dL — ABNORMAL LOW (ref 3.5–5.0)
Alkaline Phosphatase: 98 U/L (ref 38–126)
Anion gap: 7 (ref 5–15)
BUN: 25 mg/dL — ABNORMAL HIGH (ref 8–23)
CO2: 21 mmol/L — ABNORMAL LOW (ref 22–32)
Calcium: 8 mg/dL — ABNORMAL LOW (ref 8.9–10.3)
Chloride: 100 mmol/L (ref 98–111)
Creatinine, Ser: 0.51 mg/dL — ABNORMAL LOW (ref 0.61–1.24)
GFR, Estimated: >60 mL/min (ref >60)
Glucose, Bld: 132 mg/dL — ABNORMAL HIGH (ref 70–99)
Potassium: 4.1 mmol/L (ref 3.5–5.1)
Sodium: 128 mmol/L — ABNORMAL LOW (ref 135–145)
Total Bilirubin: 4.6 mg/dL — ABNORMAL HIGH (ref 0.3–1.2)
Total Protein: 5.4 g/dL — ABNORMAL LOW (ref 6.5–8.1)

## 2023-03-30 LAB — CULTURE, BODY FLUID W GRAM STAIN -BOTTLE: Culture: NO GROWTH

## 2023-03-30 LAB — PHOSPHORUS: Phosphorus: 3.8 mg/dL (ref 2.5–4.6)

## 2023-03-30 LAB — MAGNESIUM: Magnesium: 2.6 mg/dL — ABNORMAL HIGH (ref 1.7–2.4)

## 2023-03-30 MED ORDER — CIPROFLOXACIN HCL 500 MG PO TABS: 500.0000 mg | ORAL_TABLET | Freq: Every day | ORAL | 0 refills | Status: AC

## 2023-03-30 MED ORDER — LACTULOSE 10 GM/15ML PO SOLN: 20.0000 g | Freq: Three times a day (TID) | ORAL | 0 refills | Status: DC

## 2023-03-30 MED ORDER — MIDODRINE HCL 10 MG PO TABS: 10.0000 mg | ORAL_TABLET | Freq: Three times a day (TID) | ORAL | 0 refills | Status: AC

## 2023-03-30 MED ORDER — SUCRALFATE 1 GM/10ML PO SUSP: 1.0000 g | Freq: Two times a day (BID) | ORAL | 0 refills | Status: DC

## 2023-03-30 NOTE — Progress Notes (Signed)
 PICC line removed per order and with no complications.  Pressure held to achieve hemostasis.  Vaseline/gauze/tegaderm applied.  Aftercare instructions reviewed with pt who verbalized understanding.
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
## 2023-03-30 NOTE — Discharge Summary (Signed)
Physician Discharge Summary  Nathan Roberts ZOX:[id number] DOB: [birth year] DOA: 03/21/2023  PCP: Clayborne Dana, NP  Admit date: 03/21/2023  Discharge date: 03/30/2023  Admitted From: Home.  Disposition:  Home Health services.  Recommendations for Outpatient Follow-up:  Follow up with PCP in 1-2 weeks. Please obtain BMP/CBC in one week. Advised to follow-up with primary gastroenterologist for TIPS procedure. Advised to take lactulose,  Lasix, and spironolactone.. Advised to take ciprofloxacin 500 mg daily for prophylaxis.  Home Health: Home PT/OT Equipment/Devices:None  Discharge Condition: Stable CODE STATUS:Full code Diet recommendation: Heart Healthy   Brief [facility] Course: This 62 yrs old Male with PMH significant of decompensated cirrhosis, anxiety, chronic anemia-iron deficiency, esophageal varices, history of alcohol abuse, refractory ascites who presented to the hospital with complaints of a possible fall and abdominal distention due to missing paracentesis. 03/21/23> underwent paracentesis, 10 L removed,  admitted for suspected sepsis.,  Received 1 PRBC. 03/22/23> transferred to ICU, required Levophed, another paracentesis with 4 L removed.  Oncology as well as GI were consulted.  2 PRBC received. 03/23/23> transferred back to the [facility] service.  1 more PRBC given. 03/24/23 > started to have fever in the evening/night. 03/25/23> ongoing fever.  Vancomycin added.  Paracentesis with 6.3 L removed. 03/28/23> paracentesis with 6 L of clear fluid drained. 03/29/23. > EGD found to have diffuse portal hypertensive gastropathy with no signs of active bleeding. 03/30/23; feels much better,  Patient is being discharged home and follow-up with GI outpatient for TIPS procedure.    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 Carrie Mew at [facility]. EGD 10/23 grade 1 and small esophageal varices. TIPS was recommended for the patient outpatient. He take Ciprofloxacin daily for prophylaxis. Supposed to be on lactulose 30 mL 3 times daily. AFP 6.2 August 24. So far has received 4 paracentesis. Treated aggressively with IV albumin. GI consulted. On Protonix. Scheduled for EGD on 8/29. Continue soft diet. Repeat paracentesis completed 8/28, 6 L of clear fluid drained. He underwent EGD which showed diffuse portal hypertensive gastropathy with no signs of active bleeding. Advised to follow-up with outpatient GI for TIPS procedure.   Hypothermia, Suspected sepsis with SBP: Now with FUO. He presented with hypothermia, hypotension, end organ damage and electrolyte abnormalities treated as sepsis likely due to SBP. Ascitic fluid negative so far for any bacterial growth. Gram stain does not show any evidence of SBP as well. Has been on Zosyn since admission. Hypothermia has resolved.  Now has developed fever. RVP negative. CT abdomen negative for any other abnormality.  UA unremarkable. MRSA PCR negative.   Blood culture growing staph epidermis in anaerobic bottle and gram-positive rods in both bottles. Added IV vancomycin due to ongoing fever. Repeat cultures have been performed on 8/25 - Negative so far Chest x-ray unremarkable.  Pro-Cal minimally elevated.  Currently on Augmentin.  Later on we will switch to his chronic Cipro. Repeat blood cultures on 8/28 has been  negative for 48 hours.   Vancomycin discontinued. He remained afebrile  in last few days.   Fall at home: No focal deficit noted on physical exam. CT head /  C-spine negative for any acute fracture. Monitor for now. PT/OT > SNF > Patient declined  SNF, wants to go home with home services.   Acute on chronic iron deficiency anemia due to chronic blood loss: Hemoglobin 11.0  in August. Hemoglobin initially 7.7 on admission CT abdomen negative for retroperitoneal hemorrhage.  S/P 1 PRBC 8/21. 2 PRBC 8/22. 1 PRBC 8/23. Hematology consulted for concern for hemolysis but suspect most likely presentation with GI bleed. Lactulose enema revealed melanotic stool. Patient was on Protonix drip.  Now on Protonix every 12 hours. Patient provided consent for blood transfusion. Transfuse for hemoglobin less than 7 or hemodynamic instability. Underwent EGD no signs of any active bleeding. H/H > Stable.   Hyponatremia. Hyperkalemia > Resolved. Acute kidney injury> Resolved. Prior sodium 131.  On admission 125.  Currently sodium improving 127 >129 >128 Potassium level improved as well. S creatinine at baseline 0.7.  On admission 1.29 After aggressive treatment with IV albumin and blood transfusion, serum creatinine back to baseline. Elevated potassium most likely in the setting of ongoing use of Aldactone. Treated with Kayexalate enema.  Hyponatremia mostly in the setting of poor p.o. intake as well as cirrhosis. Monitor daily.   Acute urinary retention: Required In-N-Out catheterization x 2 and later on had a Foley catheter placed. Unable to initiate Flomax due to hypotension. Given that his thrombocytopenia and risk of bleeding, voiding trial becomes difficult and too risky.   Now that the platelet counts are improving.  Patient voided.   Hypoglycemia: In the setting of poor p.o. intake. Which is now improved.  On soft diet.   Mild hepatic encephalopathy: > Resolved. Ammonia level elevated. Asterixis present.   Improving. Continue lactulose.   Chronic thrombocytopenia. Worsening, likely dilutional in nature. Hematology following. In the setting of alcohol cirrhosis. Monitor for now.   Hypotension: Suspected hemorrhagic shock versus distributive shock in the setting of hypoalbuminemia. On midodrine.  Briefly required Levophed. Holding home Inderal. Blood pressure improved.   Hyperlipidemia: Holding statin for now.   Umbilical hernia without obstruction and without gangrene Monitor for now.  Currently no evidence of obstruction.   Goals of care conversation. Palliative care consulted. Patient wanted to be full code.  No HCPOA.  Required emergency consent for PICC line.     Discharge Instructions  Discharge Instructions     Call MD for:  difficulty breathing, headache or visual disturbances   Complete by: As directed    Call MD for:  persistant dizziness or light-headedness   Complete by: As directed    Call MD for:  persistant nausea and vomiting   Complete by: As directed    Diet - low sodium heart healthy   Complete by: As directed    Diet Carb Modified   Complete by: As directed    Discharge instructions   Complete by: As directed    Advised to follow-up with primary care physician in 1 week. Advised to follow-up with primary gastroenterologist for TIPS procedure. Advised to take lactulose, propranolol, Lasix, propranolol and spironolactone. Advised to take ciprofloxacin 500 mg daily for prophylaxis   Increase activity slowly   Complete by: As directed       Allergies as of 03/30/2023       Reactions   Cephalexin Rash   Doxycycline Rash   Latex  Hives, Rash        Medication List     STOP taking these medications    multivitamin with minerals tablet   propranolol 10 MG tablet Commonly known as: INDERAL       TAKE these medications    ciprofloxacin 500 MG tablet Commonly known as: CIPRO Take 1 tablet (500 mg total) by mouth daily with  breakfast. Start taking on: March 31, 2023   furosemide 40 MG tablet Commonly known as: LASIX Take 1 tablet (40 mg total) by mouth 2 (two) times daily at 10 am and 4 pm.   lactulose 10 GM/15ML solution Commonly known as: CHRONULAC Take 30 mLs (20 g total) by mouth 3 (three) times daily.   metFORMIN 500 MG tablet Commonly known as: GLUCOPHAGE Take 500 mg by mouth daily.   midodrine 10 MG tablet Commonly known as: PROAMATINE Take 1 tablet (10 mg total) by mouth 3 (three) times daily after meals.   pantoprazole 40 MG tablet Commonly known as: PROTONIX Take 1 tablet (40 mg total) by mouth 2 (two) times daily before a meal.   spironolactone 25 MG tablet Commonly known as: ALDACTONE Take 1 tablet (25 mg total) by mouth daily.   sucralfate 1 GM/10ML suspension Commonly known as: CARAFATE Take 10 mLs (1 g total) by mouth 2 (two) times daily.        Follow-up Information     Hyman Hopes B, NP Follow up in 1 week(s).   Specialties: Family Medicine, Emergency Medicine Contact information: [address] [place] [address] [phone]         [facility] (Formerly Follow up.   Why: Home Health will follow up with you 24 to 48hrs after discharge Contact information: [address] [place] [address] [phone]                Allergies  Allergen Reactions   Cephalexin Rash   Doxycycline Rash   Latex Hives and Rash    Consultations: Gastroenterology   Procedures/Studies: US Paracentesis  Result Date: 03/28/2023 INDICATION: Patient with history of ETOH cirrhosis, recurrent ascites. Request for therapeutic paracentesis. EXAM: ULTRASOUND GUIDED THERAPEUTIC PARACENTESIS MEDICATIONS: 6 mL 1% lidocaine COMPLICATIONS: None immediate. PROCEDURE: Informed written consent was obtained from the patient after a discussion of the risks, benefits and alternatives to treatment. A timeout was performed prior  to the initiation of the procedure. Initial ultrasound scanning demonstrates a large amount of ascites within the right lower abdominal quadrant. The right lower abdomen was prepped and draped in the usual sterile fashion. 1% lidocaine was used for local anesthesia. Following this, a 19 gauge, 7-cm, Yueh catheter was introduced. An ultrasound image was saved for documentation purposes. The paracentesis was performed. The catheter was removed and a dressing was applied. The patient tolerated the procedure well without immediate post procedural complication. FINDINGS: A total of approximately 6.0 L  of clear, yellow fluid was removed. IMPRESSION: Successful ultrasound-guided paracentesis yielding 6.0 liters of peritoneal fluid. Performed by Lynnette Caffey, PA-C PLAN: The patient has required >/= 2 paracenteses in a 30 day period and a screening evaluation by the [facility] Interventional Radiology Portal HTN Clinic could be considered however per patient and chart review he is followed by [facility] GI and is in discussions with [facility] IR for potential TIPS. Patient currently declining TIPS at this time due to procedure risks. Electronically Signed   By: Judie Petit.  Shick M.D.   On: 03/28/2023 12:09  DG CHEST PORT 1 VIEW  Result Date: 03/25/2023 CLINICAL DATA:  Shortness of breath, history of recent paracentesis EXAM: PORTABLE CHEST 1 VIEW COMPARISON:  03/21/2023 FINDINGS: The cardiac shadow is stable. Overall inspiratory effort is poor. Right PICC is noted with catheter tip at the cavoatrial junction. Patchy atelectatic changes are noted in the right lung new from the prior study. No effusion is seen. IMPRESSION: Mild atelectatic changes in the right lung new from the prior exam. Electronically Signed   By: Alcide Clever M.D.   On: 03/25/2023 20:21   US Paracentesis  Result Date: 03/25/2023 INDICATION: 62 year old male with recurrent ascites. Request made for diagnostic and therapeutic paracentesis. EXAM:  ULTRASOUND GUIDED DIAGNOSTIC AND THERAPEUTIC PARACENTESIS MEDICATIONS: 10 mL 1% lidocaine COMPLICATIONS: None immediate. PROCEDURE: Informed written consent was obtained from the patient after a discussion of the risks, benefits and alternatives to treatment. A timeout was performed prior to the initiation of the procedure. Initial ultrasound scanning demonstrates a large amount of ascites within the right lower abdominal quadrant. The right lower abdomen was prepped and draped in the usual sterile fashion. 1% lidocaine was used for local anesthesia. Following this, a 19 gauge, 7-cm, Yueh catheter was introduced. An ultrasound image was saved for documentation purposes. The paracentesis was performed. The catheter was removed and a dressing was applied. The patient tolerated the procedure well without immediate post procedural complication. FINDINGS: A total of approximately 6.3 liters of yellow fluid was removed. Samples were sent to the laboratory as requested by the clinical team. IMPRESSION: Successful ultrasound-guided paracentesis yielding 6.3 liters of peritoneal fluid. Performed by: Loyce Dys PA-C Electronically Signed   By: Judie Petit.  Shick M.D.   On: 03/25/2023 16:27   ECHOCARDIOGRAM COMPLETE  Result Date: 03/23/2023    ECHOCARDIOGRAM REPORT   Patient Name:   RONIEL MAYTON Date of Exam: 03/23/2023 Medical Rec #:  [id number]        Height:       68.0 in Accession #:    [id number]       Weight:       188.7 lb Date of Birth:  09/29/1960        BSA:          1.994 m Patient Age:    62 years         BP:           114/36 mmHg Patient Gender: M                HR:           85 bpm. Exam Location:  Inpatient Procedure: 2D Echo, Color Doppler and Cardiac Doppler Indications:    Hypotension                 Cirrhosis  History:        Patient has no prior history of Echocardiogram examinations.                 Risk Factors:Hypertension, Dyslipidemia and Diabetes. Alcoholic                 cirrhosis, ascites, ETOH.   Sonographer:    Milda Smart Referring Phys: [id number] PAULA B SIMPSON  Sonographer Comments: Image acquisition challenging due to patient body habitus and Image acquisition challenging due to respiratory motion. IMPRESSIONS  1. Left ventricular ejection fraction, by estimation, is 65 to 70%. The left ventricle has normal function. The left ventricle has no regional wall motion abnormalities. Left ventricular  diastolic parameters were normal.  2. Right ventricular systolic function is normal. The right ventricular size is normal.  3. Left atrial size was moderately dilated.  4. The mitral valve is normal in structure. No evidence of mitral valve regurgitation. No evidence of mitral stenosis.  5. The aortic valve is tricuspid. There is mild calcification of the aortic valve. Aortic valve regurgitation is not visualized. Aortic valve sclerosis/calcification is present, without any evidence of aortic stenosis.  6. Aortic dilatation noted. There is borderline dilatation of the ascending aorta, measuring 39 mm. FINDINGS  Left Ventricle: Left ventricular ejection fraction, by estimation, is 65 to 70%. The left ventricle has normal function. The left ventricle has no regional wall motion abnormalities. The left ventricular internal cavity size was normal in size. There is  no left ventricular hypertrophy. Left ventricular diastolic parameters were normal. Right Ventricle: The right ventricular size is normal. No increase in right ventricular wall thickness. Right ventricular systolic function is normal. Left Atrium: Left atrial size was moderately dilated. Right Atrium: Right atrial size was normal in size. Pericardium: There is no evidence of pericardial effusion. Mitral Valve: The mitral valve is normal in structure. No evidence of mitral valve regurgitation. No evidence of mitral valve stenosis. Tricuspid Valve: The tricuspid valve is normal in structure. Tricuspid valve regurgitation is mild . No evidence of tricuspid  stenosis. Aortic Valve: The aortic valve is tricuspid. There is mild calcification of the aortic valve. Aortic valve regurgitation is not visualized. Aortic valve sclerosis/calcification is present, without any evidence of aortic stenosis. Pulmonic Valve: The pulmonic valve was normal in structure. Pulmonic valve regurgitation is trivial. No evidence of pulmonic stenosis. Aorta: Aortic dilatation noted. There is borderline dilatation of the ascending aorta, measuring 39 mm. Venous: The inferior vena cava was not well visualized. IAS/Shunts: No atrial level shunt detected by color flow Doppler.  LEFT VENTRICLE PLAX 2D LVIDd:         4.50 cm   Diastology LVIDs:         2.70 cm   LV e' medial:    8.59 cm/s LV PW:         1.10 cm   LV E/e' medial:  9.5 LV IVS:        0.90 cm   LV e' lateral:   9.03 cm/s LVOT diam:     2.00 cm   LV E/e' lateral: 9.1 LV SV:         97 LV SV Index:   49 LVOT Area:     3.14 cm  RIGHT VENTRICLE RV S prime:     16.40 cm/s TAPSE (M-mode): 2.6 cm LEFT ATRIUM             Index        RIGHT ATRIUM           Index LA diam:        4.20 cm 2.11 cm/m   RA Area:     13.20 cm LA Vol (A2C):   84.7 ml 42.48 ml/m  RA Volume:   31.80 ml  15.95 ml/m LA Vol (A4C):   78.2 ml 39.22 ml/m LA Biplane Vol: 78.8 ml 39.52 ml/m  AORTIC VALVE LVOT Vmax:   157.00 cm/s LVOT Vmean:  117.000 cm/s LVOT VTI:    0.310 m  AORTA Ao Root diam: 3.40 cm Ao Asc diam:  3.90 cm MITRAL VALVE               TRICUSPID VALVE MV Area (  PHT): 3.08 cm    TR Peak grad:   28.5 mmHg MV Decel Time: 246 msec    TR Mean grad:   20.0 mmHg MV E velocity: 81.80 cm/s  TR Vmax:        267.00 cm/s MV A velocity: 88.30 cm/s  TR Vmean:       216.0 cm/s MV E/A ratio:  0.93                            SHUNTS                            Systemic VTI:  0.31 m                            Systemic Diam: 2.00 cm Arvilla Meres MD Electronically signed by Arvilla Meres MD Signature Date/Time: 03/23/2023/9:02:07 AM    Final    [place] EKG SITE RITE  Result  Date: 03/22/2023 If Site Rite image not attached, placement could not be confirmed due to current cardiac rhythm.  CT ABDOMEN PELVIS WO CONTRAST  Result Date: 03/22/2023 CLINICAL DATA:  Retroperitoneal bleed suspected. Fall, abdominal distention. EXAM: CT ABDOMEN AND PELVIS WITHOUT CONTRAST TECHNIQUE: Multidetector CT imaging of the abdomen and pelvis was performed following the standard protocol without IV contrast. RADIATION DOSE REDUCTION: This exam was performed according to the departmental dose-optimization program which includes automated exposure control, adjustment of the mA and/or kV according to patient size and/or use of iterative reconstruction technique. COMPARISON:  01/10/2023 FINDINGS: Lower chest: Bibasilar atelectasis. Coronary artery and aortic atherosclerosis. Hepatobiliary: Nodular, shrunken liver compatible with cirrhosis. Gallbladder contracted, grossly unremarkable. Pancreas: No focal abnormality or ductal dilatation. Spleen: No focal abnormality.  Normal size. Adrenals/Urinary Tract: No adrenal abnormality. No focal renal abnormality. No stones or hydronephrosis. Urinary bladder is unremarkable. Stomach/Bowel: Diffuse colonic diverticulosis. No active diverticulitis. Stomach and small bowel decompressed. No bowel obstruction. Vascular/Lymphatic: Aortic atherosclerosis. No evidence of aneurysm or adenopathy. Reproductive: No visible focal abnormality. Other: Large volume ascites. Moderate-sized umbilical hernia containing ascites, stable. Musculoskeletal: No acute bony abnormality. IMPRESSION: Cirrhosis with associated large volume ascites. Colonic diverticulosis. Umbilical hernia containing fluid. Aortic atherosclerosis. Electronically Signed   By: Charlett Nose M.D.   On: 03/22/2023 00:47   US Paracentesis  Result Date: 03/21/2023 INDICATION: Patient with history of alcoholic cirrhosis, portal hypertension, refractory ascites; request received for therapeutic paracentesis EXAM:  ULTRASOUND GUIDED THERAPEUTIC PARACENTESIS MEDICATIONS: 8 mL 1% lidocaine COMPLICATIONS: None immediate. PROCEDURE: Informed written consent was obtained from the patient after a discussion of the risks, benefits and alternatives to treatment. A timeout was performed prior to the initiation of the procedure. Initial ultrasound scanning demonstrates a large amount of ascites within the right lower abdominal quadrant. The right lower abdomen was prepped and draped in the usual sterile fashion. 1% lidocaine was used for local anesthesia. Following this, a 19 gauge, 7-cm, Yueh catheter was introduced. An ultrasound image was saved for documentation purposes. The paracentesis was performed. The catheter was removed and a dressing was applied. The patient tolerated the procedure well without immediate post procedural complication. Patient will tentatively receive post-procedure intravenous albumin in ED; see nursing notes for details. FINDINGS: A total of approximately 10 liters of slightly hazy, yellow fluid was removed. IMPRESSION: Successful ultrasound-guided therapeutic paracentesis yielding 10 liters of peritoneal fluid. PLAN: The patient has required >/=2 paracenteses  in a 30 day period and a formal evaluation by the [facility] Interventional Radiology Portal Hypertension Clinic has been arranged. Performed by: Artemio Aly Electronically Signed   By: Simonne Come M.D.   On: 03/21/2023 16:21   CT HEAD WO CONTRAST ( )  Result Date: 03/21/2023 CLINICAL DATA:  Facial trauma, blunt; Neck trauma (Age >= 65y) EXAM: CT HEAD WITHOUT CONTRAST CT CERVICAL SPINE WITHOUT CONTRAST TECHNIQUE: Multidetector CT imaging of the head and cervical spine was performed following the standard protocol without intravenous contrast. Multiplanar CT image reconstructions of the cervical spine were also generated. RADIATION DOSE REDUCTION: This exam was performed according to the departmental dose-optimization program which includes  automated exposure control, adjustment of the mA and/or kV according to patient size and/or use of iterative reconstruction technique. COMPARISON:  None Available. FINDINGS: CT HEAD FINDINGS Brain: No evidence of acute infarction, hemorrhage, hydrocephalus, extra-axial collection or mass lesion/mass effect. Vascular: No hyperdense vessel or unexpected calcification. Skull: Normal. Negative for fracture or focal lesion. Sinuses/Orbits: No middle ear effusion. Polypoid mucosal maxillary. Orbits are Other: None CT CERVICAL SPINE FINDINGS Alignment: Normal. Skull base and vertebrae: No acute fracture. No primary bone lesion or focal pathologic process. Status post C4-C5 ACDF solid osseous fusion. Soft tissues and spinal canal: No prevertebral fluid or swelling. No visible canal hematoma. Disc levels:  No evidence of high-grade spinal canal stenosis. 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 Vascular findings suggestive of anemia. Other: None IMPRESSION: 1. No acute intracranial abnormality. 2. No acute fracture or traumatic malalignment of the cervical spine. Electronically Signed   By: Lorenza Cambridge M.D.   On: 03/21/2023 15:13   CT Cervical Spine Wo Contrast  Result Date: 03/21/2023 CLINICAL DATA:  Facial trauma, blunt; Neck trauma (Age >= 65y) EXAM: CT HEAD WITHOUT CONTRAST CT CERVICAL SPINE WITHOUT CONTRAST TECHNIQUE: Multidetector CT imaging of the head and cervical spine was performed following the standard protocol without intravenous contrast. Multiplanar CT image reconstructions of the cervical spine were also generated. RADIATION DOSE REDUCTION: This exam was performed according to the departmental dose-optimization program which includes automated exposure control, adjustment of the mA and/or kV according to patient size and/or use of iterative reconstruction technique. COMPARISON:  None Available. FINDINGS: CT HEAD FINDINGS Brain: No evidence of acute infarction, hemorrhage, hydrocephalus,  extra-axial collection or mass lesion/mass effect. Vascular: No hyperdense vessel or unexpected calcification. Skull: Normal. Negative for fracture or focal lesion. Sinuses/Orbits: No middle ear effusion. Polypoid mucosal maxillary. Orbits are Other: None CT CERVICAL SPINE FINDINGS Alignment: Normal. Skull base and vertebrae: No acute fracture. No primary bone lesion or focal pathologic process. Status post C4-C5 ACDF solid osseous fusion. Soft tissues and spinal canal: No prevertebral fluid or swelling. No visible canal hematoma. Disc levels:  No evidence of high-grade spinal canal stenosis. 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 Vascular findings suggestive of anemia. Other: None IMPRESSION: 1. No acute intracranial abnormality. 2. No acute fracture or traumatic malalignment of the cervical spine. Electronically Signed   By: Lorenza Cambridge M.D.   On: 03/21/2023 15:13   DG Chest Portable 1 View  Result Date: 03/21/2023 CLINICAL DATA:  Shortness of breath pain after fall EXAM: PORTABLE CHEST 1 VIEW COMPARISON:  01/10/2023 x-ray and older FINDINGS: Poor inflation. Grossly no consolidation, pneumothorax or effusion. No edema. Normal cardiopericardial silhouette. Overall if there is concern further of sequela of trauma additional contrast CT as clinically appropriate. IMPRESSION: Poor inflation.  Grossly no acute cardiopulmonary disease Electronically Signed   By: Scarlette Shorts  Chales Abrahams M.D.   On: 03/21/2023 14:13     Subjective: Patient was seen and examined at bedside.  Overnight events noted.   Patient report doing much better and wants to be discharged.  Patient being discharged home  Discharge Exam: Vitals:   03/30/23 0419 03/30/23 1351  BP: 104/66 104/72  Pulse: 87 83  Resp: 18 16  Temp: 97.7 F (36.5 C) 97.6 F (36.4 C)  SpO2: 98% 98%   Vitals:   03/29/23 2017 03/30/23 0419 03/30/23 0425 03/30/23 1351  BP: 104/76 104/66  104/72  Pulse: 89 87  83  Resp:  18  16  Temp:  97.7 F (36.5  C)  97.6 F (36.4 C)  TempSrc:  Oral  Oral  SpO2:  98%  98%  Weight:   79.2 kg   Height:        General: Pt is alert, awake, not in acute distress Cardiovascular: RRR, S1/S2 +, no rubs, no gallops Respiratory: CTA bilaterally, no wheezing, no rhonchi Abdominal: Soft, NT, ND, bowel sounds + Extremities: no edema, no cyanosis    The results of significant diagnostics from this hospitalization (including imaging, microbiology, ancillary and laboratory) are listed below for reference.     Microbiology: Recent Results (from the past 240 hour(s))  SARS Coronavirus 2 by RT PCR (hospital order, performed in [facility] hospital lab) *cepheid single result test* Anterior Nasal Swab     Status: None   Collection Time: 03/21/23  4:29 PM   Specimen: Anterior Nasal Swab  Result Value Ref Range Status   SARS Coronavirus 2 by RT PCR NEGATIVE NEGATIVE Final    Comment: (NOTE) SARS-CoV-2 target nucleic acids are NOT DETECTED.  The SARS-CoV-2 RNA is generally detectable in upper and lower respiratory specimens during the acute phase of infection. The lowest concentration of SARS-CoV-2 viral copies this assay can detect is 250 copies / mL. A negative result does not preclude SARS-CoV-2 infection and should not be used as the sole basis for treatment or other patient management decisions.  A negative result may occur with improper specimen collection / handling, submission of specimen other than nasopharyngeal swab, presence of viral mutation(s) within the areas targeted by this assay, and inadequate number of viral copies (<250 copies / mL). A negative result must be combined with clinical observations, patient history, and epidemiological information.  Fact Sheet for Patients:   RoadLapTop.co.za  Fact Sheet for Healthcare Providers: http://kim-miller.com/  This test is not yet approved or  cleared by the [place] FDA and has been  authorized for detection and/or diagnosis of SARS-CoV-2 by FDA under an Emergency Use Authorization (EUA).  This EUA will remain in effect (meaning this test can be used) for the duration of the COVID-19 declaration under Section 564(b)(1) of the Act, 21 U.S.C. section 360bbb-3(b)(1), unless the authorization is terminated or revoked sooner.  Performed at [facility], [address], [place], [place] [address]   Culture, blood (Routine X 2) w Reflex to ID Panel     Status: Abnormal   Collection Time: 03/21/23  7:32 PM   Specimen: BLOOD LEFT ARM  Result Value Ref Range Status   Specimen Description   Final    BLOOD LEFT ARM Performed at [facility], [address], [place], [place] [address]    Special Requests   Final    BOTTLES DRAWN AEROBIC AND ANAEROBIC Blood Culture adequate volume Performed at [facility], [address] [place], [place] [address]  Culture  Setup Time   Final    GRAM POSITIVE RODS IN BOTH AEROBIC AND ANAEROBIC BOTTLES CRITICAL RESULT CALLED TO, READ BACK BY AND VERIFIED WITH: PHARMD NICK  GLOGOVAC [id number] FCP    Culture (A)  Final    CORYNEBACTERIUM SPECIES Standardized susceptibility testing for this organism is not available. Performed at [facility], [address] [place], [place] [address]    Report Status 03/25/2023 FINAL  Final  Culture, blood (Routine X 2) w Reflex to ID Panel     Status: Abnormal   Collection Time: 03/21/23  7:37 PM   Specimen: BLOOD  Result Value Ref Range Status   Specimen Description   Final    BLOOD SITE NOT SPECIFIED Performed at [facility], [address] [place], [place] [address]    Special Requests   Final    BOTTLES DRAWN AEROBIC AND ANAEROBIC Blood Culture adequate volume Performed at [facility], [address] [place], [place] [address]    Culture  Setup Time   Final    GRAM POSITIVE COCCI ANAEROBIC BOTTLE  ONLY Organism ID to follow AEROBIC BOTTLE ONLY GRAM POSITIVE RODS CRITICAL VALUE NOTED.  VALUE IS CONSISTENT WITH PREVIOUSLY REPORTED AND CALLED VALUE.    Culture (A)  Final    STAPHYLOCOCCUS EPIDERMIDIS CRITICAL RESULT CALLED TO, READ BACK BY AND VERIFIED WITH: PHARMD E. Jean Rosenthal [phone] @2229  FH THE SIGNIFICANCE OF ISOLATING THIS ORGANISM FROM A SINGLE SET OF BLOOD CULTURES WHEN MULTIPLE SETS ARE DRAWN IS UNCERTAIN. PLEASE NOTIFY THE MICROBIOLOGY DEPARTMENT WITHIN ONE WEEK IF SPECIATION AND SENSITIVITIES ARE REQUIRED. DIPHTHEROIDS(CORYNEBACTERIUM SPECIES) Standardized susceptibility testing for this organism is not available. Performed at [facility], [address] [place], [place] [address]    Report Status 03/28/2023 FINAL  Final  Blood Culture ID Panel (Reflexed)     Status: Abnormal   Collection Time: 03/21/23  7:37 PM  Result Value Ref Range Status   Enterococcus faecalis NOT DETECTED NOT DETECTED Final   Enterococcus Faecium NOT DETECTED NOT DETECTED Final   Listeria monocytogenes NOT DETECTED NOT DETECTED Final   Staphylococcus species DETECTED (A) NOT DETECTED Final    Comment: CRITICAL RESULT CALLED TO, READ BACK BY AND VERIFIED WITH: PHARMD E. Jean Rosenthal [phone] @2229  FH    Staphylococcus aureus (BCID) NOT DETECTED NOT DETECTED Final   Staphylococcus epidermidis DETECTED (A) NOT DETECTED Final    Comment: CRITICAL RESULT CALLED TO, READ BACK BY AND VERIFIED WITH: PHARMD ECathren Harsh @2229  FH    Staphylococcus lugdunensis NOT DETECTED NOT DETECTED Final   Streptococcus species NOT DETECTED NOT DETECTED Final   Streptococcus agalactiae NOT DETECTED NOT DETECTED Final   Streptococcus pneumoniae NOT DETECTED NOT DETECTED Final   Streptococcus pyogenes NOT DETECTED NOT DETECTED Final   A.calcoaceticus-baumannii NOT DETECTED NOT DETECTED Final   Bacteroides fragilis NOT DETECTED NOT DETECTED Final   Enterobacterales NOT DETECTED NOT DETECTED Final   Enterobacter  cloacae complex NOT DETECTED NOT DETECTED Final   Escherichia coli NOT DETECTED NOT DETECTED Final   Klebsiella aerogenes NOT DETECTED NOT DETECTED Final   Klebsiella oxytoca NOT DETECTED NOT DETECTED Final   Klebsiella pneumoniae NOT DETECTED NOT DETECTED Final   Proteus species NOT DETECTED NOT DETECTED Final   Salmonella species NOT DETECTED NOT DETECTED Final   Serratia marcescens NOT DETECTED NOT DETECTED Final   Haemophilus influenzae NOT DETECTED NOT DETECTED Final   Neisseria meningitidis NOT DETECTED NOT DETECTED Final   Pseudomonas  aeruginosa NOT DETECTED NOT DETECTED Final   Stenotrophomonas maltophilia NOT DETECTED NOT DETECTED Final   Candida albicans NOT DETECTED NOT DETECTED Final   Candida auris NOT DETECTED NOT DETECTED Final   Candida glabrata NOT DETECTED NOT DETECTED Final   Candida krusei NOT DETECTED NOT DETECTED Final   Candida parapsilosis NOT DETECTED NOT DETECTED Final   Candida tropicalis NOT DETECTED NOT DETECTED Final   Cryptococcus neoformans/gattii NOT DETECTED NOT DETECTED Final   Methicillin resistance mecA/C NOT DETECTED NOT DETECTED Final    Comment: Performed at [facility], [address] [place], [place] [address]  Peritoneal fluid culture w Gram Stain     Status: None   Collection Time: 03/22/23 11:46 AM   Specimen: Peritoneal Washings; Peritoneal Fluid  Result Value Ref Range Status   Specimen Description   Final    PERITONEAL Performed at [facility], [address] [place], [place] [address]    Special Requests   Final    NONE Performed at [facility], [address], [place], [place] [address]    Gram Stain   Final    RARE WBC PRESENT,BOTH PMN AND MONONUCLEAR NO ORGANISMS SEEN    Culture   Final    NO GROWTH 3 DAYS Performed at [facility], [address] [place], [place] [address]    Report Status 03/25/2023 FINAL  Final  Respiratory (~20 pathogens) panel by PCR     Status:  None   Collection Time: 03/25/23  9:44 AM   Specimen: Nasopharyngeal Swab; Respiratory  Result Value Ref Range Status   Adenovirus NOT DETECTED NOT DETECTED Final   Coronavirus 229E NOT DETECTED NOT DETECTED Final    Comment: (NOTE) The Coronavirus on the Respiratory Panel, DOES NOT test for the novel  Coronavirus (2019 nCoV)    Coronavirus HKU1 NOT DETECTED NOT DETECTED Final   Coronavirus NL63 NOT DETECTED NOT DETECTED Final   Coronavirus OC43 NOT DETECTED NOT DETECTED Final   Metapneumovirus NOT DETECTED NOT DETECTED Final   Rhinovirus / Enterovirus NOT DETECTED NOT DETECTED Final   Influenza A NOT DETECTED NOT DETECTED Final   Influenza B NOT DETECTED NOT DETECTED Final   Parainfluenza Virus 1 NOT DETECTED NOT DETECTED Final   Parainfluenza Virus 2 NOT DETECTED NOT DETECTED Final   Parainfluenza Virus 3 NOT DETECTED NOT DETECTED Final   Parainfluenza Virus 4 NOT DETECTED NOT DETECTED Final   Respiratory Syncytial Virus NOT DETECTED NOT DETECTED Final   Bordetella pertussis NOT DETECTED NOT DETECTED Final   Bordetella Parapertussis NOT DETECTED NOT DETECTED Final   Chlamydophila pneumoniae NOT DETECTED NOT DETECTED Final   Mycoplasma pneumoniae NOT DETECTED NOT DETECTED Final    Comment: Performed at [facility], [address], [place], [place] [address]  Culture, blood (Routine X 2) w Reflex to ID Panel     Status: None   Collection Time: 03/25/23 11:08 AM   Specimen: BLOOD  Result Value Ref Range Status   Specimen Description   Final    BLOOD RIGHT ANTECUBITAL Performed at [facility], [address] [place], [place] [address]    Special Requests   Final    BOTTLES DRAWN AEROBIC AND ANAEROBIC Blood Culture adequate volume Performed at [facility], [address] [place], [place] [address]    Culture   Final    NO GROWTH 5 DAYS Performed at [facility], [address] [place],  [place] [address]    Report Status 03/30/2023  FINAL  Final  Culture, blood (Routine X 2) w Reflex to ID Panel     Status: None   Collection Time: 03/25/23 11:08 AM   Specimen: BLOOD  Result Value Ref Range Status   Specimen Description   Final    BLOOD LEFT ANTECUBITAL Performed at [facility], [address] [place], [place] [address]    Special Requests   Final    BOTTLES DRAWN AEROBIC AND ANAEROBIC Blood Culture adequate volume Performed at [facility], [address], [place], [place] [address]    Culture   Final    NO GROWTH 5 DAYS Performed at [facility], [address] [place], [place] [address]    Report Status 03/30/2023 FINAL  Final  Culture, body fluid w Gram Stain-bottle     Status: None   Collection Time: 03/25/23  1:24 PM   Specimen: Peritoneal Washings  Result Value Ref Range Status   Specimen Description PERITONEAL  Final   Special Requests NONE  Final   Culture   Final    NO GROWTH 5 DAYS Performed at [facility], [address] [place], [place] [address]    Report Status 03/30/2023 FINAL  Final  Gram stain     Status: None   Collection Time: 03/25/23  1:24 PM   Specimen: Peritoneal Washings  Result Value Ref Range Status   Specimen Description PERITONEAL  Final   Special Requests NONE  Final   Gram Stain   Final    NO ORGANISMS SEEN Performed at [facility], [address], [place], [place] [address]    Report Status 03/25/2023 FINAL  Final     Labs: BNP (last 3 results) Recent Labs    03/21/23 1314  BNP 54.1   Basic Metabolic Panel: Recent Labs  Lab 03/26/23 0443 03/27/23 0525 03/28/23 0413 03/29/23 0354 03/30/23 0322  NA 126* 124* 127* 129* 128*  K 3.8 3.9 4.0 4.2 4.1  CL 98 98 97* 99 100  CO2 22 22 21* 21* 21*  GLUCOSE 123* 123* 171* 125* 132*  BUN 20 23 28* 25* 25*  CREATININE 0.61 0.61 0.78 0.70 0.51*  CALCIUM 8.3* 8.2* 8.5* 8.4* 8.0*  MG 2.3 2.5* 2.4 2.5* 2.6*  PHOS  --   --   --  2.7 3.8   Liver Function Tests: Recent  Labs  Lab 03/26/23 0443 03/27/23 0525 03/28/23 0413 03/29/23 0354 03/30/23 0322  AST 35 40 43* 47* 45*  ALT 16 17 17 19 20   ALKPHOS 84 87 85 92 98  BILITOT 3.6* 4.0* 3.9* 4.7* 4.6*  PROT 5.4* 5.3* 5.2* 5.6* 5.4*  ALBUMIN 3.7 3.5 3.2* 3.2* 2.9*   No results for input(s): "LIPASE", "AMYLASE" in the last 168 hours. Recent Labs  Lab 03/24/23 0514 03/25/23 0537  AMMONIA 52* 44*   CBC: Recent Labs  Lab 03/25/23 0942 03/26/23 0443 03/27/23 0525 03/28/23 0413 03/29/23 0354 03/30/23 0322  WBC 9.2 10.4 10.5 8.3 8.1 7.4  NEUTROABS 6.5 7.4 7.0 5.3  --   --   HGB 9.7* 9.1* 9.0* 8.9* 10.0* 9.1*  HCT 28.8* 27.2* 26.7* 26.6* 28.2* 26.0*  MCV 96.6 95.8 96.0 97.8 98.6 101.6*  PLT 73* 69* 84* 100* 112* 115*   Cardiac Enzymes: No results for input(s): "CKTOTAL", "CKMB", "CKMBINDEX", "TROPONINI" in the last 168 hours. BNP: Invalid input(s): "POCBNP" CBG: Recent Labs  Lab 03/29/23 2020 03/29/23  2353 03/30/23 0416 03/30/23 0726 03/30/23 1153  GLUCAP 143* 119* 133* 118* 181*   D-Dimer No results for input(s): "DDIMER" in the last 72 hours. Hgb A1c No results for input(s): "HGBA1C" in the last 72 hours. Lipid Profile No results for input(s): "CHOL", "HDL", "LDLCALC", "TRIG", "CHOLHDL", "LDLDIRECT" in the last 72 hours. Thyroid function studies No results for input(s): "TSH", "T4TOTAL", "T3FREE", "THYROIDAB" in the last 72 hours.  Invalid input(s): "FREET3" Anemia work up No results for input(s): "VITAMINB12", "FOLATE", "FERRITIN", "TIBC", "IRON", "RETICCTPCT" in the last 72 hours. Urinalysis    Component Value Date/Time   COLORURINE AMBER (A) 03/25/2023 0942   APPEARANCEUR HAZY (A) 03/25/2023 0942   LABSPEC 1.030 03/25/2023 0942   PHURINE 5.0 03/25/2023 0942   GLUCOSEU NEGATIVE 03/25/2023 0942   HGBUR LARGE (A) 03/25/2023 0942   BILIRUBINUR NEGATIVE 03/25/2023 0942   BILIRUBINUR n 11/01/2011 1511   KETONESUR NEGATIVE 03/25/2023 0942   PROTEINUR 30 (A) 03/25/2023 0942    UROBILINOGEN 2.0 11/01/2011 1511   NITRITE NEGATIVE 03/25/2023 0942   LEUKOCYTESUR NEGATIVE 03/25/2023 0942   Sepsis Labs Recent Labs  Lab 03/27/23 0525 03/28/23 0413 03/29/23 0354 03/30/23 0322  WBC 10.5 8.3 8.1 7.4   Microbiology Recent Results (from the past 240 hour(s))  SARS Coronavirus 2 by RT PCR (hospital order, performed in [facility] hospital lab) *cepheid single result test* Anterior Nasal Swab     Status: None   Collection Time: 03/21/23  4:29 PM   Specimen: Anterior Nasal Swab  Result Value Ref Range Status   SARS Coronavirus 2 by RT PCR NEGATIVE NEGATIVE Final    Comment: (NOTE) SARS-CoV-2 target nucleic acids are NOT DETECTED.  The SARS-CoV-2 RNA is generally detectable in upper and lower respiratory specimens during the acute phase of infection. The lowest concentration of SARS-CoV-2 viral copies this assay can detect is 250 copies / mL. A negative result does not preclude SARS-CoV-2 infection and should not be used as the sole basis for treatment or other patient management decisions.  A negative result may occur with improper specimen collection / handling, submission of specimen other than nasopharyngeal swab, presence of viral mutation(s) within the areas targeted by this assay, and inadequate number of viral copies (<250 copies / mL). A negative result must be combined with clinical observations, patient history, and epidemiological information.  Fact Sheet for Patients:   RoadLapTop.co.za  Fact Sheet for Healthcare Providers: http://kim-miller.com/  This test is not yet approved or  cleared by the [place] FDA and has been authorized for detection and/or diagnosis of SARS-CoV-2 by FDA under an Emergency Use Authorization (EUA).  This EUA will remain in effect (meaning this test can be used) for the duration of the COVID-19 declaration under Section 564(b)(1) of the Act, 21 U.S.C. section  360bbb-3(b)(1), unless the authorization is terminated or revoked sooner.  Performed at [facility], [address], [place], [place] [address]   Culture, blood (Routine X 2) w Reflex to ID Panel     Status: Abnormal   Collection Time: 03/21/23  7:32 PM   Specimen: BLOOD LEFT ARM  Result Value Ref Range Status   Specimen Description   Final    BLOOD LEFT ARM Performed at [facility], [address] [place], [place] [address]    Special Requests   Final    BOTTLES DRAWN AEROBIC AND ANAEROBIC Blood Culture adequate volume Performed at [facility], [address] [place], [place] [address]  Culture  Setup Time   Final    GRAM POSITIVE RODS IN BOTH AEROBIC AND ANAEROBIC BOTTLES CRITICAL RESULT CALLED TO, READ BACK BY AND VERIFIED WITH: PHARMD NICK  GLOGOVAC [id number] FCP    Culture (A)  Final    CORYNEBACTERIUM SPECIES Standardized susceptibility testing for this organism is not available. Performed at [facility], [address], [place], [place] [address]    Report Status 03/25/2023 FINAL  Final  Culture, blood (Routine X 2) w Reflex to ID Panel     Status: Abnormal   Collection Time: 03/21/23  7:37 PM   Specimen: BLOOD  Result Value Ref Range Status   Specimen Description   Final    BLOOD SITE NOT SPECIFIED Performed at [facility], [address], [place], [place] [address]    Special Requests   Final    BOTTLES DRAWN AEROBIC AND ANAEROBIC Blood Culture adequate volume Performed at [facility], [address], [place], [place] [address]    Culture  Setup Time   Final    GRAM POSITIVE COCCI ANAEROBIC BOTTLE ONLY Organism ID to follow AEROBIC BOTTLE ONLY GRAM POSITIVE RODS CRITICAL VALUE NOTED.  VALUE IS CONSISTENT WITH PREVIOUSLY REPORTED AND CALLED VALUE.    Culture (A)  Final    STAPHYLOCOCCUS EPIDERMIDIS CRITICAL RESULT CALLED TO, READ BACK BY AND VERIFIED WITH: PHARMD E.  Cathren Harsh @2229  FH THE SIGNIFICANCE OF ISOLATING THIS ORGANISM FROM A SINGLE SET OF BLOOD CULTURES WHEN MULTIPLE SETS ARE DRAWN IS UNCERTAIN. PLEASE NOTIFY THE MICROBIOLOGY DEPARTMENT WITHIN ONE WEEK IF SPECIATION AND SENSITIVITIES ARE REQUIRED. DIPHTHEROIDS(CORYNEBACTERIUM SPECIES) Standardized susceptibility testing for this organism is not available. Performed at [facility], [address] [place], [place] [address]    Report Status 03/28/2023 FINAL  Final  Blood Culture ID Panel (Reflexed)     Status: Abnormal   Collection Time: 03/21/23  7:37 PM  Result Value Ref Range Status   Enterococcus faecalis NOT DETECTED NOT DETECTED Final   Enterococcus Faecium NOT DETECTED NOT DETECTED Final   Listeria monocytogenes NOT DETECTED NOT DETECTED Final   Staphylococcus species DETECTED (A) NOT DETECTED Final    Comment: CRITICAL RESULT CALLED TO, READ BACK BY AND VERIFIED WITH: PHARMD ECathren Harsh @2229  FH    Staphylococcus aureus (BCID) NOT DETECTED NOT DETECTED Final   Staphylococcus epidermidis DETECTED (A) NOT DETECTED Final    Comment: CRITICAL RESULT CALLED TO, READ BACK BY AND VERIFIED WITH: PHARMD ECathren Harsh @2229  FH    Staphylococcus lugdunensis NOT DETECTED NOT DETECTED Final   Streptococcus species NOT DETECTED NOT DETECTED Final   Streptococcus agalactiae NOT DETECTED NOT DETECTED Final   Streptococcus pneumoniae NOT DETECTED NOT DETECTED Final   Streptococcus pyogenes NOT DETECTED NOT DETECTED Final   A.calcoaceticus-baumannii NOT DETECTED NOT DETECTED Final   Bacteroides fragilis NOT DETECTED NOT DETECTED Final   Enterobacterales NOT DETECTED NOT DETECTED Final   Enterobacter cloacae complex NOT DETECTED NOT DETECTED Final   Escherichia coli NOT DETECTED NOT DETECTED Final   Klebsiella aerogenes NOT DETECTED NOT DETECTED Final   Klebsiella oxytoca NOT DETECTED NOT DETECTED Final   Klebsiella pneumoniae NOT DETECTED NOT DETECTED Final   Proteus  species NOT DETECTED NOT DETECTED Final   Salmonella species NOT DETECTED NOT DETECTED Final   Serratia marcescens NOT DETECTED NOT DETECTED Final   Haemophilus influenzae NOT DETECTED NOT DETECTED Final   Neisseria meningitidis NOT DETECTED NOT DETECTED Final   Pseudomonas  aeruginosa NOT DETECTED NOT DETECTED Final   Stenotrophomonas maltophilia NOT DETECTED NOT DETECTED Final   Candida albicans NOT DETECTED NOT DETECTED Final   Candida auris NOT DETECTED NOT DETECTED Final   Candida glabrata NOT DETECTED NOT DETECTED Final   Candida krusei NOT DETECTED NOT DETECTED Final   Candida parapsilosis NOT DETECTED NOT DETECTED Final   Candida tropicalis NOT DETECTED NOT DETECTED Final   Cryptococcus neoformans/gattii NOT DETECTED NOT DETECTED Final   Methicillin resistance mecA/C NOT DETECTED NOT DETECTED Final    Comment: Performed at [facility], [address], [place], [place] [address]  Peritoneal fluid culture w Gram Stain     Status: None   Collection Time: 03/22/23 11:46 AM   Specimen: Peritoneal Washings; Peritoneal Fluid  Result Value Ref Range Status   Specimen Description   Final    PERITONEAL Performed at [facility], [address], [place], [place] [address]    Special Requests   Final    NONE Performed at [facility], [address], [place], [place] [address]    Gram Stain   Final    RARE WBC PRESENT,BOTH PMN AND MONONUCLEAR NO ORGANISMS SEEN    Culture   Final    NO GROWTH 3 DAYS Performed at [facility], [address], [place], [place] [address]    Report Status 03/25/2023 FINAL  Final  Respiratory (~20 pathogens) panel by PCR     Status: None   Collection Time: 03/25/23  9:44 AM   Specimen: Nasopharyngeal Swab; Respiratory  Result Value Ref Range Status   Adenovirus NOT DETECTED NOT DETECTED Final   Coronavirus 229E NOT DETECTED NOT DETECTED Final    Comment: (NOTE) The Coronavirus on the Respiratory  Panel, DOES NOT test for the novel  Coronavirus (2019 nCoV)    Coronavirus HKU1 NOT DETECTED NOT DETECTED Final   Coronavirus NL63 NOT DETECTED NOT DETECTED Final   Coronavirus OC43 NOT DETECTED NOT DETECTED Final   Metapneumovirus NOT DETECTED NOT DETECTED Final   Rhinovirus / Enterovirus NOT DETECTED NOT DETECTED Final   Influenza A NOT DETECTED NOT DETECTED Final   Influenza B NOT DETECTED NOT DETECTED Final   Parainfluenza Virus 1 NOT DETECTED NOT DETECTED Final   Parainfluenza Virus 2 NOT DETECTED NOT DETECTED Final   Parainfluenza Virus 3 NOT DETECTED NOT DETECTED Final   Parainfluenza Virus 4 NOT DETECTED NOT DETECTED Final   Respiratory Syncytial Virus NOT DETECTED NOT DETECTED Final   Bordetella pertussis NOT DETECTED NOT DETECTED Final   Bordetella Parapertussis NOT DETECTED NOT DETECTED Final   Chlamydophila pneumoniae NOT DETECTED NOT DETECTED Final   Mycoplasma pneumoniae NOT DETECTED NOT DETECTED Final    Comment: Performed at [facility], [address], [place], [place] [address]  Culture, blood (Routine X 2) w Reflex to ID Panel     Status: None   Collection Time: 03/25/23 11:08 AM   Specimen: BLOOD  Result Value Ref Range Status   Specimen Description   Final    BLOOD RIGHT ANTECUBITAL Performed at [facility], [address], [place], [place] [address]    Special Requests   Final    BOTTLES DRAWN AEROBIC AND ANAEROBIC Blood Culture adequate volume Performed at [facility], [address] [place], [place] [address]    Culture   Final    NO GROWTH 5 DAYS Performed at [facility], [address], [place],  [place] [address]    Report Status 03/30/2023 FINAL  Final  Culture, blood (Routine X 2) w Reflex to ID Panel     Status: None   Collection Time: 03/25/23 11:08 AM   Specimen: BLOOD  Result Value Ref Range Status   Specimen Description   Final    BLOOD LEFT ANTECUBITAL Performed at [facility], [address] [place], [place] [address]    Special Requests   Final    BOTTLES DRAWN AEROBIC AND ANAEROBIC Blood Culture adequate volume Performed at [facility], [address], [place], [place] [address]    Culture   Final    NO GROWTH 5 DAYS Performed at [facility], [address] [place], [place] [address]    Report Status 03/30/2023 FINAL  Final  Culture, body fluid w Gram Stain-bottle     Status: None   Collection Time: 03/25/23  1:24 PM   Specimen: Peritoneal Washings  Result Value Ref Range Status   Specimen Description PERITONEAL  Final   Special Requests NONE  Final   Culture   Final    NO GROWTH 5 DAYS Performed at [facility], [address], [place], [place] [address]    Report Status 03/30/2023 FINAL  Final  Gram stain     Status: None   Collection Time: 03/25/23  1:24 PM   Specimen: Peritoneal Washings  Result Value Ref Range Status   Specimen Description PERITONEAL  Final   Special Requests NONE  Final   Gram Stain   Final    NO ORGANISMS SEEN Performed at [facility], [address], [place], [place] [address]    Report Status 03/25/2023 FINAL  Final     Time coordinating discharge: Over 30 minutes  SIGNED:   Willeen Niece, MD  [facility] 03/30/2023, 1:59 PM Pager   If 7PM-7AM, please contact night-coverage

## 2023-03-30 NOTE — Discharge Instructions (Signed)
Advised to follow-up with primary gastroenterologist for TIPS procedure. Advised to take lactulose, propranolol, Lasix, propranolol and spironolactone. Advised to take ciprofloxacin 500 mg daily for prophylaxis

## 2023-04-02 ENCOUNTER — Encounter (HOSPITAL_COMMUNITY): Payer: Self-pay | Admitting: Internal Medicine

## 2023-04-04 ENCOUNTER — Telehealth: Payer: Self-pay | Admitting: Family Medicine

## 2023-04-04 DIAGNOSIS — K429 Umbilical hernia without obstruction or gangrene: Secondary | ICD-10-CM | POA: Diagnosis not present

## 2023-04-04 NOTE — Telephone Encounter (Signed)
Charice [facility]) called stating she had reached out to the pt to schedule [facility] PT Eval. Pt stated to her that he would discuss this with his wife before scheduling.

## 2023-04-04 NOTE — Telephone Encounter (Signed)
FYI

## 2023-04-06 ENCOUNTER — Telehealth: Payer: Self-pay | Admitting: Family Medicine

## 2023-04-06 NOTE — Telephone Encounter (Signed)
Noted. We also received message yesterday and FYI was sent to [facility].

## 2023-04-06 NOTE — Telephone Encounter (Signed)
Charise from [facility] called to notify Ladona Ridgel that they have been unsuccessful at contacting the pt for physical therapy. Charise stated that pt has not responded.

## 2023-04-17 DIAGNOSIS — K7460 Unspecified cirrhosis of liver: Secondary | ICD-10-CM | POA: Diagnosis not present

## 2023-04-17 DIAGNOSIS — K7290 Hepatic failure, unspecified without coma: Secondary | ICD-10-CM | POA: Diagnosis not present

## 2023-04-20 DIAGNOSIS — K7031 Alcoholic cirrhosis of liver with ascites: Secondary | ICD-10-CM | POA: Diagnosis not present

## 2023-04-20 DIAGNOSIS — K7460 Unspecified cirrhosis of liver: Secondary | ICD-10-CM | POA: Diagnosis not present

## 2023-04-20 DIAGNOSIS — K7290 Hepatic failure, unspecified without coma: Secondary | ICD-10-CM | POA: Diagnosis not present

## 2023-04-20 DIAGNOSIS — R188 Other ascites: Secondary | ICD-10-CM | POA: Diagnosis not present

## 2023-04-24 ENCOUNTER — Other Ambulatory Visit: Payer: Self-pay

## 2023-04-24 ENCOUNTER — Inpatient Hospital Stay (HOSPITAL_COMMUNITY)
Admission: EM | Admit: 2023-04-24 | Discharge: 2023-06-01 | DRG: 432 | Disposition: E | Payer: 59 | Attending: Pulmonary Disease | Admitting: Pulmonary Disease

## 2023-04-24 ENCOUNTER — Encounter (HOSPITAL_COMMUNITY): Payer: Self-pay | Admitting: Emergency Medicine

## 2023-04-24 DIAGNOSIS — Z608 Other problems related to social environment: Secondary | ICD-10-CM | POA: Diagnosis present

## 2023-04-24 DIAGNOSIS — J9601 Acute respiratory failure with hypoxia: Secondary | ICD-10-CM | POA: Diagnosis not present

## 2023-04-24 DIAGNOSIS — Z881 Allergy status to other antibiotic agents status: Secondary | ICD-10-CM

## 2023-04-24 DIAGNOSIS — Z79899 Other long term (current) drug therapy: Secondary | ICD-10-CM

## 2023-04-24 DIAGNOSIS — I10 Essential (primary) hypertension: Secondary | ICD-10-CM | POA: Diagnosis not present

## 2023-04-24 DIAGNOSIS — R140 Abdominal distension (gaseous): Secondary | ICD-10-CM | POA: Diagnosis not present

## 2023-04-24 DIAGNOSIS — Z515 Encounter for palliative care: Secondary | ICD-10-CM

## 2023-04-24 DIAGNOSIS — D62 Acute posthemorrhagic anemia: Secondary | ICD-10-CM | POA: Diagnosis not present

## 2023-04-24 DIAGNOSIS — E872 Acidosis, unspecified: Secondary | ICD-10-CM | POA: Diagnosis not present

## 2023-04-24 DIAGNOSIS — E119 Type 2 diabetes mellitus without complications: Secondary | ICD-10-CM

## 2023-04-24 DIAGNOSIS — Z452 Encounter for adjustment and management of vascular access device: Secondary | ICD-10-CM | POA: Diagnosis not present

## 2023-04-24 DIAGNOSIS — K920 Hematemesis: Secondary | ICD-10-CM | POA: Diagnosis not present

## 2023-04-24 DIAGNOSIS — K7682 Hepatic encephalopathy: Secondary | ICD-10-CM | POA: Diagnosis not present

## 2023-04-24 DIAGNOSIS — Z8249 Family history of ischemic heart disease and other diseases of the circulatory system: Secondary | ICD-10-CM

## 2023-04-24 DIAGNOSIS — E1165 Type 2 diabetes mellitus with hyperglycemia: Secondary | ICD-10-CM | POA: Diagnosis not present

## 2023-04-24 DIAGNOSIS — R410 Disorientation, unspecified: Secondary | ICD-10-CM | POA: Diagnosis not present

## 2023-04-24 DIAGNOSIS — K921 Melena: Secondary | ICD-10-CM | POA: Diagnosis not present

## 2023-04-24 DIAGNOSIS — I9589 Other hypotension: Secondary | ICD-10-CM | POA: Diagnosis present

## 2023-04-24 DIAGNOSIS — Z4789 Encounter for other orthopedic aftercare: Secondary | ICD-10-CM | POA: Diagnosis not present

## 2023-04-24 DIAGNOSIS — J690 Pneumonitis due to inhalation of food and vomit: Secondary | ICD-10-CM | POA: Diagnosis not present

## 2023-04-24 DIAGNOSIS — N170 Acute kidney failure with tubular necrosis: Secondary | ICD-10-CM | POA: Diagnosis not present

## 2023-04-24 DIAGNOSIS — R578 Other shock: Secondary | ICD-10-CM

## 2023-04-24 DIAGNOSIS — Z6826 Body mass index (BMI) 26.0-26.9, adult: Secondary | ICD-10-CM

## 2023-04-24 DIAGNOSIS — N179 Acute kidney failure, unspecified: Secondary | ICD-10-CM

## 2023-04-24 DIAGNOSIS — D509 Iron deficiency anemia, unspecified: Secondary | ICD-10-CM | POA: Diagnosis not present

## 2023-04-24 DIAGNOSIS — R579 Shock, unspecified: Secondary | ICD-10-CM | POA: Diagnosis present

## 2023-04-24 DIAGNOSIS — Z66 Do not resuscitate: Secondary | ICD-10-CM | POA: Diagnosis present

## 2023-04-24 DIAGNOSIS — K7460 Unspecified cirrhosis of liver: Secondary | ICD-10-CM | POA: Diagnosis not present

## 2023-04-24 DIAGNOSIS — R4182 Altered mental status, unspecified: Secondary | ICD-10-CM | POA: Diagnosis not present

## 2023-04-24 DIAGNOSIS — Z91199 Patient's noncompliance with other medical treatment and regimen due to unspecified reason: Secondary | ICD-10-CM

## 2023-04-24 DIAGNOSIS — E1169 Type 2 diabetes mellitus with other specified complication: Secondary | ICD-10-CM | POA: Diagnosis not present

## 2023-04-24 DIAGNOSIS — R918 Other nonspecific abnormal finding of lung field: Secondary | ICD-10-CM | POA: Diagnosis not present

## 2023-04-24 DIAGNOSIS — R64 Cachexia: Secondary | ICD-10-CM | POA: Diagnosis present

## 2023-04-24 DIAGNOSIS — Z5982 Transportation insecurity: Secondary | ICD-10-CM

## 2023-04-24 DIAGNOSIS — D696 Thrombocytopenia, unspecified: Secondary | ICD-10-CM | POA: Diagnosis not present

## 2023-04-24 DIAGNOSIS — R636 Underweight: Secondary | ICD-10-CM | POA: Diagnosis present

## 2023-04-24 DIAGNOSIS — K7031 Alcoholic cirrhosis of liver with ascites: Secondary | ICD-10-CM | POA: Diagnosis present

## 2023-04-24 DIAGNOSIS — Z811 Family history of alcohol abuse and dependence: Secondary | ICD-10-CM

## 2023-04-24 DIAGNOSIS — G8929 Other chronic pain: Secondary | ICD-10-CM | POA: Diagnosis present

## 2023-04-24 DIAGNOSIS — Z5941 Food insecurity: Secondary | ICD-10-CM

## 2023-04-24 DIAGNOSIS — G9341 Metabolic encephalopathy: Secondary | ICD-10-CM | POA: Diagnosis not present

## 2023-04-24 DIAGNOSIS — R0602 Shortness of breath: Secondary | ICD-10-CM | POA: Diagnosis not present

## 2023-04-24 DIAGNOSIS — K766 Portal hypertension: Secondary | ICD-10-CM | POA: Diagnosis not present

## 2023-04-24 DIAGNOSIS — Z635 Disruption of family by separation and divorce: Secondary | ICD-10-CM

## 2023-04-24 DIAGNOSIS — Z9911 Dependence on respirator [ventilator] status: Secondary | ICD-10-CM | POA: Diagnosis not present

## 2023-04-24 DIAGNOSIS — E785 Hyperlipidemia, unspecified: Secondary | ICD-10-CM | POA: Diagnosis present

## 2023-04-24 DIAGNOSIS — K219 Gastro-esophageal reflux disease without esophagitis: Secondary | ICD-10-CM | POA: Diagnosis present

## 2023-04-24 DIAGNOSIS — Z4682 Encounter for fitting and adjustment of non-vascular catheter: Secondary | ICD-10-CM | POA: Diagnosis not present

## 2023-04-24 DIAGNOSIS — K7200 Acute and subacute hepatic failure without coma: Secondary | ICD-10-CM

## 2023-04-24 DIAGNOSIS — R0989 Other specified symptoms and signs involving the circulatory and respiratory systems: Secondary | ICD-10-CM | POA: Diagnosis not present

## 2023-04-24 DIAGNOSIS — J341 Cyst and mucocele of nose and nasal sinus: Secondary | ICD-10-CM | POA: Diagnosis not present

## 2023-04-24 DIAGNOSIS — E875 Hyperkalemia: Secondary | ICD-10-CM | POA: Diagnosis not present

## 2023-04-24 DIAGNOSIS — F1010 Alcohol abuse, uncomplicated: Secondary | ICD-10-CM | POA: Diagnosis present

## 2023-04-24 DIAGNOSIS — Z7189 Other specified counseling: Secondary | ICD-10-CM

## 2023-04-24 DIAGNOSIS — R188 Other ascites: Secondary | ICD-10-CM | POA: Diagnosis not present

## 2023-04-24 DIAGNOSIS — Z87891 Personal history of nicotine dependence: Secondary | ICD-10-CM

## 2023-04-24 DIAGNOSIS — K7040 Alcoholic hepatic failure without coma: Principal | ICD-10-CM | POA: Diagnosis present

## 2023-04-24 DIAGNOSIS — R9389 Abnormal findings on diagnostic imaging of other specified body structures: Secondary | ICD-10-CM | POA: Diagnosis not present

## 2023-04-24 DIAGNOSIS — Z743 Need for continuous supervision: Secondary | ICD-10-CM | POA: Diagnosis not present

## 2023-04-24 DIAGNOSIS — J9600 Acute respiratory failure, unspecified whether with hypoxia or hypercapnia: Secondary | ICD-10-CM | POA: Diagnosis not present

## 2023-04-24 DIAGNOSIS — R42 Dizziness and giddiness: Secondary | ICD-10-CM | POA: Diagnosis not present

## 2023-04-24 DIAGNOSIS — Z7984 Long term (current) use of oral hypoglycemic drugs: Secondary | ICD-10-CM

## 2023-04-24 DIAGNOSIS — E871 Hypo-osmolality and hyponatremia: Secondary | ICD-10-CM | POA: Diagnosis not present

## 2023-04-24 DIAGNOSIS — R0603 Acute respiratory distress: Secondary | ICD-10-CM | POA: Diagnosis not present

## 2023-04-24 DIAGNOSIS — Z91148 Patient's other noncompliance with medication regimen for other reason: Secondary | ICD-10-CM

## 2023-04-24 DIAGNOSIS — R4589 Other symptoms and signs involving emotional state: Secondary | ICD-10-CM | POA: Diagnosis not present

## 2023-04-24 DIAGNOSIS — Z91040 Latex allergy status: Secondary | ICD-10-CM

## 2023-04-24 LAB — COMPREHENSIVE METABOLIC PANEL
ALT: 22 U/L (ref 0–44)
AST: 54 U/L — ABNORMAL HIGH (ref 15–41)
Albumin: 2.9 g/dL — ABNORMAL LOW (ref 3.5–5.0)
Alkaline Phosphatase: 99 U/L (ref 38–126)
Anion gap: 8 (ref 5–15)
BUN: 18 mg/dL (ref 8–23)
CO2: 24 mmol/L (ref 22–32)
Calcium: 8.5 mg/dL — ABNORMAL LOW (ref 8.9–10.3)
Chloride: 99 mmol/L (ref 98–111)
Creatinine, Ser: 0.65 mg/dL (ref 0.61–1.24)
GFR, Estimated: >60 mL/min (ref >60)
Glucose, Bld: 126 mg/dL — ABNORMAL HIGH (ref 70–99)
Potassium: 4.1 mmol/L (ref 3.5–5.1)
Sodium: 131 mmol/L — ABNORMAL LOW (ref 135–145)
Total Bilirubin: 2.9 mg/dL — ABNORMAL HIGH (ref 0.3–1.2)
Total Protein: 6.5 g/dL (ref 6.5–8.1)

## 2023-04-24 LAB — GLUCOSE, CAPILLARY: Glucose-Capillary: 107 mg/dL — ABNORMAL HIGH (ref 70–99)

## 2023-04-24 LAB — CBC
HCT: 31.1 % — ABNORMAL LOW (ref 39.0–52.0)
Hemoglobin: 10 g/dL — ABNORMAL LOW (ref 13.0–17.0)
MCH: 31.9 pg (ref 26.0–34.0)
MCHC: 32.2 g/dL (ref 30.0–36.0)
MCV: 99.4 fL (ref 80.0–100.0)
Platelets: 101 10*3/uL — ABNORMAL LOW (ref 150–400)
RBC: 3.13 MIL/uL — ABNORMAL LOW (ref 4.22–5.81)
RDW: 15.5 % (ref 11.5–15.5)
WBC: 4.7 10*3/uL (ref 4.0–10.5)
nRBC: 0 % (ref 0.0–0.2)

## 2023-04-24 LAB — AMMONIA: Ammonia: 147 umol/L — ABNORMAL HIGH (ref 9–35)

## 2023-04-24 LAB — PROTIME-INR
INR: 1.7 — ABNORMAL HIGH (ref 0.8–1.2)
Prothrombin Time: 19.8 seconds — ABNORMAL HIGH (ref 11.4–15.2)

## 2023-04-24 LAB — APTT: aPTT: 37 seconds — ABNORMAL HIGH (ref 24–36)

## 2023-04-24 MED ORDER — LACTULOSE 10 GM/15ML PO SOLN
30.0000 g | Freq: Once | ORAL | Status: AC
  Administered 2023-04-24: 30 g via ORAL
  Filled 2023-04-24: qty 60

## 2023-04-24 MED ORDER — LACTULOSE 10 GM/15ML PO SOLN
20.0000 g | Freq: Three times a day (TID) | ORAL | Status: DC
  Administered 2023-04-24 – 2023-04-25 (×4): 20 g via ORAL
  Filled 2023-04-24 (×4): qty 30

## 2023-04-24 MED ORDER — SPIRONOLACTONE 25 MG PO TABS
25.0000 mg | ORAL_TABLET | Freq: Every day | ORAL | Status: DC
  Administered 2023-04-25: 25 mg via ORAL
  Filled 2023-04-24: qty 1

## 2023-04-24 MED ORDER — MIDODRINE HCL 5 MG PO TABS
10.0000 mg | ORAL_TABLET | Freq: Three times a day (TID) | ORAL | Status: DC
  Administered 2023-04-24 – 2023-04-25 (×4): 10 mg via ORAL
  Filled 2023-04-24 (×4): qty 2

## 2023-04-24 MED ORDER — SUCRALFATE 1 GM/10ML PO SUSP
1.0000 g | Freq: Two times a day (BID) | ORAL | Status: DC
  Administered 2023-04-25: 1 g via ORAL
  Filled 2023-04-24: qty 10

## 2023-04-24 MED ORDER — PANTOPRAZOLE SODIUM 40 MG PO TBEC
40.0000 mg | DELAYED_RELEASE_TABLET | Freq: Two times a day (BID) | ORAL | Status: DC
  Administered 2023-04-25 (×2): 40 mg via ORAL
  Filled 2023-04-24 (×2): qty 1

## 2023-04-24 MED ORDER — ONDANSETRON HCL 4 MG/2ML IJ SOLN
4.0000 mg | Freq: Four times a day (QID) | INTRAMUSCULAR | Status: DC | PRN
  Administered 2023-04-26: 4 mg via INTRAVENOUS
  Filled 2023-04-24: qty 2

## 2023-04-24 MED ORDER — ALBUTEROL SULFATE (2.5 MG/3ML) 0.083% IN NEBU: 2.5000 mg | INHALATION_SOLUTION | RESPIRATORY_TRACT | Status: DC | PRN

## 2023-04-24 MED ORDER — CIPROFLOXACIN HCL 500 MG PO TABS
500.0000 mg | ORAL_TABLET | Freq: Every day | ORAL | Status: DC
  Administered 2023-04-25: 500 mg via ORAL
  Filled 2023-04-24: qty 1

## 2023-04-24 MED ORDER — IBUPROFEN 200 MG PO TABS
400.0000 mg | ORAL_TABLET | Freq: Four times a day (QID) | ORAL | Status: DC | PRN
  Administered 2023-04-25: 400 mg via ORAL
  Filled 2023-04-24: qty 2

## 2023-04-24 MED ORDER — FUROSEMIDE 40 MG PO TABS
40.0000 mg | ORAL_TABLET | Freq: Two times a day (BID) | ORAL | Status: DC
  Administered 2023-04-25: 40 mg via ORAL
  Filled 2023-04-24: qty 1

## 2023-04-24 MED ORDER — ONDANSETRON HCL 4 MG/2ML IJ SOLN
4.0000 mg | Freq: Once | INTRAMUSCULAR | Status: AC
  Administered 2023-04-24: 4 mg via INTRAVENOUS
  Filled 2023-04-24: qty 2

## 2023-04-24 MED ORDER — ONDANSETRON HCL 4 MG PO TABS: 4.0000 mg | ORAL_TABLET | Freq: Four times a day (QID) | ORAL | Status: DC | PRN

## 2023-04-24 MED ORDER — INSULIN ASPART 100 UNIT/ML IJ SOLN
0.0000 [IU] | Freq: Three times a day (TID) | INTRAMUSCULAR | Status: DC
  Filled 2023-04-24: qty 0.15

## 2023-04-24 MED ORDER — INSULIN ASPART 100 UNIT/ML IJ SOLN
0.0000 [IU] | Freq: Every day | INTRAMUSCULAR | Status: DC
  Filled 2023-04-24: qty 0.05

## 2023-04-24 NOTE — H&P (Signed)
History and Physical  Nathan Roberts ZOX:[id number] DOB: 1960/08/30 DOA: 04/26/23  PCP: Nathan Dana, NP   Chief Complaint: Confusion  HPI: Nathan Roberts is a 62 y.o. 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  Patient was actually admitted to [facility] 8/21 to 8/30 for refractory ascites, during which time he underwent EGD which showed diffuse portal hypertensive gastropathy and grade 1 esophageal varices.  On review of his chart, it seems he is followed at [facility] by Dr. Carrie Roberts of gastroenterology, he has had multiple therapeutic paracentesis including most recently 13 L paracentesis on 9/20.  It seems that TIPS procedure has been recommended, but the patient is currently declining due to concern for side effects.  Currently the patient seems to be somewhat confused unable to provide much of a history, according to gastroenterology notes he has been noncompliant with various medications so it is unclear whether he has been taking his lactulose.  Today he was at the grocery store and EMS was called since he was acting very confused.  Currently the patient is ambulating without difficulty in the room, he knows where he is, tells me that he has been feeling generally well, has no complaints but admits to feeling confused lately.  Denies abdominal pain, nausea or fevers.  On evaluation in the emergency department, his vital signs are stable and normal, lab work reveals stable hemoglobin of 10, platelets 101, sodium 131, normal renal function, ammonia of 147.  He was given a dose of oral lactulose, and hospitalist admission was requested.  Review of Systems: Please see HPI for pertinent positives and negatives. A complete 10 system review of  systems are otherwise negative.  Past Medical History:  Diagnosis Date   Allergy    Arthritis    Carpal tunnel syndrome    Diabetes mellitus without complication (HCC)    Esophageal varices (HCC)    GERD (gastroesophageal reflux disease)    GI bleed    Hyperlipidemia    Hypertension    Pancreas divisum 03/22/2023   Portal hypertensive gastropathy (HCC)    Pre-diabetes    Past Surgical History:  Procedure Laterality Date   abscess     rectal area   CARPAL TUNNEL RELEASE     left   CARPAL TUNNEL RELEASE Right 07/07/2021   Procedure: RIGHT CARPAL TUNNEL RELEASE;  Surgeon: Cindee Salt, MD;  Location: [facility];  Service: Orthopedics;  Laterality: Right;   CERVICAL FUSION     COLONOSCOPY WITH PROPOFOL N/A 05/29/2022   Procedure: COLONOSCOPY WITH PROPOFOL;  Surgeon: Napoleon Form, MD;  [facility]: [facility];  Service: Gastroenterology;  Laterality: N/A;   ESOPHAGEAL BANDING  10/07/2020   Procedure: ESOPHAGEAL BANDING;  Surgeon: Napoleon Form, MD;  Location: [facility];  Service: Endoscopy;;   ESOPHAGOGASTRODUODENOSCOPY N/A 03/29/2023   Procedure: ESOPHAGOGASTRODUODENOSCOPY (EGD);  Surgeon: Lynann Bologna, DO;  Location: [facility];  Service: Gastroenterology;  Laterality: N/A;   ESOPHAGOGASTRODUODENOSCOPY (EGD) WITH PROPOFOL N/A 10/07/2020   Procedure: ESOPHAGOGASTRODUODENOSCOPY (EGD) WITH PROPOFOL;  Surgeon: Napoleon Form, MD;  [facility]: [facility];  Service: Endoscopy;  Laterality: N/A;   ESOPHAGOGASTRODUODENOSCOPY (EGD) WITH PROPOFOL N/A 01/18/2021   Procedure: ESOPHAGOGASTRODUODENOSCOPY (EGD) WITH PROPOFOL;  Surgeon: Napoleon Form, MD;  [facility];  Service: Endoscopy;  Laterality: N/A;   ESOPHAGOGASTRODUODENOSCOPY (EGD) WITH PROPOFOL N/A  05/29/2022   Procedure: ESOPHAGOGASTRODUODENOSCOPY (EGD) WITH PROPOFOL;  Surgeon: Napoleon Form, MD;  Location: [facility];  Service: Gastroenterology;  Laterality: N/A;    FASCIECTOMY Left 02/10/2021   Procedure: FASCIECTOMY RING FINGER AND SMALL FINGER OF LEFT HAND;  Surgeon: Cindee Salt, MD;  Location: [facility];  Service: Orthopedics;  Laterality: Left;   FASCIECTOMY Right 07/07/2021   Procedure: FASCIECTOMY RIGHT 1ST WEB, RIGHT RING FINGER, RIGHT SMALL FINGER;  Surgeon: Cindee Salt, MD;  Location: [facility];  Service: Orthopedics;  Laterality: Right;   SPINE SURGERY      Social History:  reports that he quit smoking about 26 years ago. His smoking use included cigarettes. He has never used smokeless tobacco. He reports current alcohol use of about 9.0 standard drinks of alcohol per week. He reports that he does not use drugs.   Allergies  Allergen Reactions   Cephalexin Rash   Doxycycline Rash   Latex Hives and Rash    Family History  Problem Relation Age of Onset   Early death Mother    Lung cancer Mother 58       lung--smoker   Alcohol abuse Mother    Atrial fibrillation Father    Heart disease Father    COPD Father    Hyperlipidemia Father    Hypertension Father    Arthritis Paternal Grandmother    Cancer Paternal Grandfather    Colon cancer Neg Hx    Esophageal cancer Neg Hx    Stomach cancer Neg Hx    Rectal cancer Neg Hx      Prior to Admission medications   Medication Sig Start Date End Date Taking? Authorizing Provider  ciprofloxacin (CIPRO) 500 MG tablet Take 1 tablet (500 mg total) by mouth daily with breakfast. 03/31/23 04/30/23  Willeen Niece, MD  furosemide (LASIX) 40 MG tablet Take 1 tablet (40 mg total) by mouth 2 (two) times daily at 10 am and 4 pm. 02/20/23 03/22/23  Nathan Dana, NP  lactulose (CHRONULAC) 10 GM/15ML solution Take 30 mLs (20 g total) by mouth 3 (three) times daily. 03/30/23   Willeen Niece, MD  metFORMIN (GLUCOPHAGE) 500 MG tablet Take 500 mg by mouth daily.    [provider]  midodrine (PROAMATINE) 10 MG tablet Take 1 tablet (10 mg total) by mouth 3 (three) times  daily after meals. 03/30/23 04/29/23  Willeen Niece, MD  pantoprazole (PROTONIX) 40 MG tablet Take 1 tablet (40 mg total) by mouth 2 (two) times daily before a meal. 02/20/23   Nathan Dana, NP  spironolactone (ALDACTONE) 25 MG tablet Take 1 tablet (25 mg total) by mouth daily. 02/20/23 03/22/23  Nathan Dana, NP  sucralfate (CARAFATE) 1 GM/10ML suspension Take 10 mLs (1 g total) by mouth 2 (two) times daily. 03/30/23   Willeen Niece, MD    Physical Exam: BP 122/73   Pulse 63   Resp 13   Ht 5\' 8"  (1.727 m)   Wt 79.2 kg   SpO2 100%   BMI 26.55 kg/m   General:  Alert, oriented to self and place, calm, in no acute distress, emaciated appearing, not in acute distress Eyes: EOMI, clear conjuctivae, white sclerea Neck: supple, no masses, trachea mildline  Cardiovascular: RRR, no murmurs or rubs, no peripheral edema  Respiratory: clear to auscultation bilaterally, no wheezes, no crackles  Abdomen: Distended, nontender, soft, with positive fluid wave Skin: dry, no rashes  Musculoskeletal: no joint effusions, normal range of motion  Psychiatric:  appropriate affect, normal speech  Neurologic: extraocular muscles intact, clear speech, moving all extremities with intact sensorium         Labs on Admission:  Basic Metabolic Panel: Recent Labs  Lab 04/15/2023 1600  NA 131*  K 4.1  CL 99  CO2 24  GLUCOSE 126*  BUN 18  CREATININE 0.65  CALCIUM 8.5*   Liver Function Tests: Recent Labs  Lab 04/22/2023 1600  AST 54*  ALT 22  ALKPHOS 99  BILITOT 2.9*  PROT 6.5  ALBUMIN 2.9*   No results for input(s): "LIPASE", "AMYLASE" in the last 168 hours. Recent Labs  Lab 04/08/2023 1600  AMMONIA 147*   CBC: Recent Labs  Lab 04/20/2023 1600  WBC 4.7  HGB 10.0*  HCT 31.1*  MCV 99.4  PLT 101*   Cardiac Enzymes: No results for input(s): "CKTOTAL", "CKMB", "CKMBINDEX", "TROPONINI" in the last 168 hours.  BNP (last 3 results) Recent Labs    03/21/23 1314  BNP 54.1    ProBNP (last 3  results) No results for input(s): "PROBNP" in the last 8760 hours.  CBG: No results for input(s): "GLUCAP" in the last 168 hours.  Radiological Exams on Admission: No results found.  Assessment/Plan MIQUEAS RACKERS is a 62 y.o. 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   Hepatic encephalopathy-patient with known alcohol-related liver cirrhosis, refractory ascites, medication noncompliance admitted to the hospital with encephalopathy and elevated ammonia level -Inpatient admission -Continue Aldactone, Lasix, lactulose -No evidence of acute infection, no focal neurological deficits  Decompensated hepatic cirrhosis, with refractory ascites-last paracentesis on 9/20, followed by [facility].  Per documentation, patient is contemplating TIPS procedure.  No abdominal pain or other evidence of acute infection. -Continue Aldactone, Lasix, lactulose -Continue Cipro prophylaxis -Anticipate outpatient follow-up with Dr. Carrie Roberts at [facility]  Chronic iron deficiency anemia-appears to be stable  Thrombocytopenia-relatively stable, presumably due to his liver disease  DVT prophylaxis: SCDs only    Code Status: Full Code  Consults called: None  Admission status: The appropriate patient status for this patient is INPATIENT. Inpatient status is judged to be reasonable and necessary in order to provide the required intensity of service to ensure the patient's safety. The patient's presenting symptoms, physical exam findings, and initial radiographic and laboratory data in the context of their chronic comorbidities is felt to place them at high risk for further clinical deterioration. Furthermore, it is not anticipated that the patient will be  medically stable for discharge from the hospital within 2 midnights of admission.    I certify that at the point of admission it is my clinical judgment that the patient will require inpatient hospital care spanning beyond 2 midnights from the point of admission due to high intensity of service, high risk for further deterioration and high frequency of surveillance required  Time spent: 58 minutes  Ameya Kutz Sharlette Dense MD [facility] Pager [phone]  If 7PM-7AM, please contact night-coverage [email or web address] Password [email or web address]  04/09/2023, 7:01 PM

## 2023-04-24 NOTE — ED Notes (Signed)
ED TO INPATIENT HANDOFF REPORT  ED Nurse Name and Phone #: Deon Pilling [phone]  S Name/Age/Gender Nathan Roberts 62 y.o. male Room/Bed: WA13/WA13  Code Status   Code Status: Full Code  Home/SNF/Other Home Patient oriented to: self and place Is this baseline? No   Triage Complete: Triage complete  Chief Complaint Hepatic encephalopathy [facility]) [K76.82]  Triage Note Pt BIBA from grocery store, staff called EMS after discovering that pt seemed physically weak and altered mentally. Pt abd visibly distended. Pt has hx of cirrhosis.    Allergies Allergies  Allergen Reactions   Cephalexin Rash   Doxycycline Rash   Latex Hives and Rash    Level of Care/Admitting Diagnosis ED Disposition     ED Disposition  Admit   Condition  --   Comment  Hospital Area: [facility] [[id number]]  Level of Care: Med-Surg [16]  May admit patient to [facility] if equivalent level of care is available:: Yes  Covid Evaluation: Asymptomatic - no recent exposure (last 10 days) testing not required  Diagnosis: Hepatic encephalopathy (HCC) [572.2.ICD-9-CM]  Admitting Physician: Maryln Gottron [[id number]]  Attending Physician: Kirby Crigler, MIR [email or web address] [[id number]]  Certification:: I certify this patient will need inpatient services for at least 2 midnights  Expected Medical Readiness: 04/27/2023          B Medical/Surgery History Past Medical History:  Diagnosis Date   Allergy    Arthritis    Carpal tunnel syndrome    Diabetes mellitus without complication (HCC)    Esophageal varices (HCC)    GERD (gastroesophageal reflux disease)    GI bleed    Hyperlipidemia    Hypertension    Pancreas divisum 03/22/2023   Portal hypertensive gastropathy (HCC)    Pre-diabetes    Past Surgical History:  Procedure Laterality Date   abscess     rectal area   CARPAL TUNNEL RELEASE     left   CARPAL TUNNEL RELEASE Right 07/07/2021   Procedure: RIGHT CARPAL TUNNEL RELEASE;   Surgeon: Cindee Salt, MD;  Location: [facility];  Service: Orthopedics;  Laterality: Right;   CERVICAL FUSION     COLONOSCOPY WITH PROPOFOL N/A 05/29/2022   Procedure: COLONOSCOPY WITH PROPOFOL;  Surgeon: Napoleon Form, MD;  [facility];  Service: Gastroenterology;  Laterality: N/A;   ESOPHAGEAL BANDING  10/07/2020   Procedure: ESOPHAGEAL BANDING;  Surgeon: Napoleon Form, MD;  Location[facility];  Service: Endoscopy;;   ESOPHAGOGASTRODUODENOSCOPY N/A 03/29/2023   Procedure: ESOPHAGOGASTRODUODENOSCOPY (EGD);  Surgeon: Lynann Bologna, DO;  Location: [facility];  Service: Gastroenterology;  Laterality: N/A;   ESOPHAGOGASTRODUODENOSCOPY (EGD) WITH PROPOFOL N/A 10/07/2020   Procedure: ESOPHAGOGASTRODUODENOSCOPY (EGD) WITH PROPOFOL;  Surgeon: Napoleon Form, MD;  [facility];  Service: Endoscopy;  Laterality: N/A;   ESOPHAGOGASTRODUODENOSCOPY (EGD) WITH PROPOFOL N/A 01/18/2021   Procedure: ESOPHAGOGASTRODUODENOSCOPY (EGD) WITH PROPOFOL;  Surgeon: Napoleon Form, MD;  [facility];  Service: Endoscopy;  Laterality: N/A;   ESOPHAGOGASTRODUODENOSCOPY (EGD) WITH PROPOFOL N/A 05/29/2022   Procedure: ESOPHAGOGASTRODUODENOSCOPY (EGD) WITH PROPOFOL;  Surgeon: Napoleon Form, MD;  [facility]: [facility];  Service: Gastroenterology;  Laterality: N/A;   FASCIECTOMY Left 02/10/2021   Procedure: FASCIECTOMY RING FINGER AND SMALL FINGER OF LEFT HAND;  Surgeon: Cindee Salt, MD;  Location: [facility];  Service: Orthopedics;  Laterality: Left;   FASCIECTOMY Right 07/07/2021   Procedure: FASCIECTOMY RIGHT 1ST WEB, RIGHT RING FINGER, RIGHT SMALL FINGER;  Surgeon: Cindee Salt, MD;  Location: [facility];  Service: Orthopedics;  Laterality: Right;   SPINE SURGERY       A IV Location/Drains/Wounds Patient Lines/Drains/Airways Status     Active Line/Drains/Airways     Name Placement date Placement time Site Days    Peripheral IV 04/24/23 20 G Left Antecubital 04/24/23  1500  Antecubital  less than 1   Wound / Incision (Open or Dehisced) 11/27/17 Non-pressure wound Leg Right cellulitis, scabbed, red/pink, open area on RLE inside of leg 11/27/17  --  Leg  1974            Intake/Output Last 24 hours No intake or output data in the 24 hours ending 04/24/23 1921  Labs/Imaging Results for orders placed or performed during the hospital encounter of 04/24/23 (from the past 48 hour(s))  CBC     Status: Abnormal   Collection Time: 04/24/23  4:00 PM  Result Value Ref Range   WBC 4.7 4.0 - 10.5 K/uL   RBC 3.13 (L) 4.22 - 5.81 MIL/uL   Hemoglobin 10.0 (L) 13.0 - 17.0 g/dL   HCT 95.6 (L) 21.3 - 08.6 %   MCV 99.4 80.0 - 100.0 fL   MCH 31.9 26.0 - 34.0 pg   MCHC 32.2 30.0 - 36.0 g/dL   RDW 57.8 46.9 - 62.9 %   Platelets 101 (L) 150 - 400 K/uL    Comment: Immature Platelet Fraction may be clinically indicated, consider ordering this additional test [id number] CONSISTENT WITH PREVIOUS RESULT REPEATED TO VERIFY    nRBC 0.0 0.0 - 0.2 %    Comment: Performed at [facility], [address], [place], [place] [address]  Comprehensive metabolic panel     Status: Abnormal   Collection Time: 04/24/23  4:00 PM  Result Value Ref Range   Sodium 131 (L) 135 - 145 mmol/L   Potassium 4.1 3.5 - 5.1 mmol/L   Chloride 99 98 - 111 mmol/L   CO2 24 22 - 32 mmol/L   Glucose, Bld 126 (H) 70 - 99 mg/dL    Comment: Glucose reference range applies only to samples taken after fasting for at least 8 hours.   BUN 18 8 - 23 mg/dL   Creatinine, Ser 7.25 0.61 - 1.24 mg/dL   Calcium 8.5 (L) 8.9 - 10.3 mg/dL   Total Protein 6.5 6.5 - 8.1 g/dL   Albumin 2.9 (L) 3.5 - 5.0 g/dL   AST 54 (H) 15 - 41 U/L   ALT 22 0 - 44 U/L   Alkaline Phosphatase 99 38 - 126 U/L   Total Bilirubin 2.9 (H) 0.3 - 1.2 mg/dL   GFR, Estimated >36 >64 mL/min    Comment: (NOTE) Calculated using the CKD-EPI Creatinine Equation  (2021)    Anion gap 8 5 - 15    Comment: Performed at [facility], [address] [place], [place] [address]  Ammonia     Status: Abnormal   Collection Time: 04/24/23  4:00 PM  Result Value Ref Range   Ammonia 147 (H) 9 - 35 umol/L    Comment: Performed at [facility], [address], [place], [place] [address]  Protime-INR     Status: Abnormal   Collection Time: 04/24/23  4:00 PM  Result Value Ref Range   Prothrombin Time 19.8 (H) 11.4 - 15.2 seconds   INR 1.7 (H) 0.8 - 1.2    Comment: (NOTE) INR goal varies based on device and disease states. Performed at [facility], [address], [place], [place] [address]   APTT     Status: Abnormal   Collection Time: 04/24/23  4:00 PM  Result Value Ref Range   aPTT 37 (H) 24 - 36 seconds    Comment:        IF BASELINE aPTT IS ELEVATED, SUGGEST PATIENT RISK ASSESSMENT BE USED TO DETERMINE APPROPRIATE ANTICOAGULANT THERAPY. Performed at [facility], [address] [place], [place] [address]    No results found.  Pending Labs Unresulted Labs (From admission, onward)     Start     Ordered   04/25/23 0500  Comprehensive metabolic panel  Tomorrow morning,   R        04/24/23 1900   04/25/23 0500  CBC  Tomorrow morning,   R        04/24/23 1900   04/24/23 1557  Urinalysis, Routine w reflex microscopic -Urine, Clean Catch  Once,   URGENT       Question:  Specimen Source  Answer:  Urine, Clean Catch   04/24/23 1556            Vitals/Pain Today's Vitals   04/24/23 1549 04/24/23 1645 04/24/23 1800  BP:  123/71 122/73  Pulse:  64 63  Resp:  10 13  SpO2:  100% 100%  Weight: 79.2 kg    Height: 5\' 8"  (1.727 m)    PainSc: 0-No pain      Isolation Precautions No active isolations  Medications Medications  ciprofloxacin (CIPRO) tablet 500 mg (has no administration in time range)  furosemide (LASIX) tablet 40 mg (has no administration in time  range)  spironolactone (ALDACTONE) tablet 25 mg (has no administration in time range)  midodrine (PROAMATINE) tablet 10 mg (has no administration in time range)  lactulose (CHRONULAC) 10 GM/15ML solution 20 g (has no administration in time range)  pantoprazole (PROTONIX) EC tablet 40 mg (has no administration in time range)  sucralfate (CARAFATE) 1 GM/10ML suspension 1 g (has no administration in time range)  insulin aspart (novoLOG) injection 0-15 Units (has no administration in time range)  insulin aspart (novoLOG) injection 0-5 Units (has no administration in time range)  ibuprofen (ADVIL) tablet 400 mg (has no administration in time range)  ondansetron (ZOFRAN) tablet 4 mg (has no administration in time range)    Or  ondansetron (ZOFRAN) injection 4 mg (has no administration in time range)  albuterol (PROVENTIL) (2.5 MG/3ML) 0.083% nebulizer solution 2.5 mg (has no administration in time range)  ondansetron (ZOFRAN) injection 4 mg (4 mg Intravenous Given 04/24/23 1642)  lactulose (CHRONULAC) 10 GM/15ML solution 30 g (30 g Oral Given 04/24/23 1802)    Mobility walks with person assist     Focused Assessments    R Recommendations: See Admitting Provider Note  Report given to:   Additional Notes:

## 2023-04-24 NOTE — ED Provider Notes (Signed)
[place] EMERGENCY DEPARTMENT AT [facility] Provider Note   CSN: [id number] Arrival date & time: 04/24/23  1538     History  Chief Complaint  Patient presents with   Altered Mental Status    Nathan Roberts is a 62 y.o. male with past medical history significant for hypertension, GERD, ascites due to alcoholic cirrhosis, esophageal varices, previous upper GI bleed, hepatic encephalopathy who presents with concern for altered mental status, physical weakness while at the store.  He endorses some nausea, denies any vomiting, abdominal pain.  Denies any current alcohol use.  He can tell me his name and that he is at the hospital but is confused on the date.  He reports that he takes some kind of a medication for his liver disease but he cannot tell me what.   Altered Mental Status      Home Medications Prior to Admission medications   Medication Sig Start Date End Date Taking? Authorizing Provider  ciprofloxacin (CIPRO) 500 MG tablet Take 1 tablet (500 mg total) by mouth daily with breakfast. 03/31/23 04/30/23  Willeen Niece, MD  furosemide (LASIX) 40 MG tablet Take 1 tablet (40 mg total) by mouth 2 (two) times daily at 10 am and 4 pm. 02/20/23 03/22/23  Clayborne Dana, NP  lactulose (CHRONULAC) 10 GM/15ML solution Take 30 mLs (20 g total) by mouth 3 (three) times daily. 03/30/23   Willeen Niece, MD  metFORMIN (GLUCOPHAGE) 500 MG tablet Take 500 mg by mouth daily.    [provider]  midodrine (PROAMATINE) 10 MG tablet Take 1 tablet (10 mg total) by mouth 3 (three) times daily after meals. 03/30/23 04/29/23  Willeen Niece, MD  pantoprazole (PROTONIX) 40 MG tablet Take 1 tablet (40 mg total) by mouth 2 (two) times daily before a meal. 02/20/23   Clayborne Dana, NP  spironolactone (ALDACTONE) 25 MG tablet Take 1 tablet (25 mg total) by mouth daily. 02/20/23 03/22/23  Clayborne Dana, NP  sucralfate (CARAFATE) 1 GM/10ML suspension Take 10 mLs (1 g total) by mouth 2 (two)  times daily. 03/30/23   Willeen Niece, MD      Allergies    Cephalexin, Doxycycline, and Latex    Review of Systems   Review of Systems  All other systems reviewed and are negative.   Physical Exam Updated Vital Signs BP 118/81   Pulse 76   Resp 13   Ht 5\' 8"  (1.727 m)   Wt 79.2 kg   SpO2 100%   BMI 26.55 kg/m  Physical Exam Vitals and nursing note reviewed.  Constitutional:      General: He is not in acute distress.    Appearance: Normal appearance.  HENT:     Head: Normocephalic and atraumatic.  Eyes:     General: Scleral icterus present.        Right eye: No discharge.        Left eye: No discharge.  Cardiovascular:     Rate and Rhythm: Normal rate and regular rhythm.     Heart sounds: No murmur heard.    No friction rub. No gallop.  Pulmonary:     Effort: Pulmonary effort is normal.     Breath sounds: Normal breath sounds.  Abdominal:     General: Bowel sounds are normal. There is distension.     Palpations: Abdomen is soft.     Comments: Large soft umbilical hernia, abdomen is grossly distended and cirrhotic  Skin:    General: Skin  is warm and dry.     Capillary Refill: Capillary refill takes less than 2 seconds.  Neurological:     Mental Status: He is alert.     Comments: Oriented to self and situation but not time, place, asterixis noted on exam  Psychiatric:        Mood and Affect: Mood normal.        Behavior: Behavior normal.     ED Results / Procedures / Treatments   Labs (all labs ordered are listed, but only abnormal results are displayed) Labs Reviewed  CBC - Abnormal; Notable for the following components:      Result Value   RBC 3.13 (*)    Hemoglobin 10.0 (*)    HCT 31.1 (*)    Platelets 101 (*)    All other components within normal limits  COMPREHENSIVE METABOLIC PANEL - Abnormal; Notable for the following components:   Sodium 131 (*)    Glucose, Bld 126 (*)    Calcium 8.5 (*)    Albumin 2.9 (*)    AST 54 (*)    Total Bilirubin  2.9 (*)    All other components within normal limits  AMMONIA - Abnormal; Notable for the following components:   Ammonia 147 (*)    All other components within normal limits  PROTIME-INR - Abnormal; Notable for the following components:   Prothrombin Time 19.8 (*)    INR 1.7 (*)    All other components within normal limits  APTT - Abnormal; Notable for the following components:   aPTT 37 (*)    All other components within normal limits  URINALYSIS, ROUTINE W REFLEX MICROSCOPIC  COMPREHENSIVE METABOLIC PANEL  CBC    EKG None  Radiology No results found.  Procedures .Critical Care  Performed by: Olene Floss, PA-C Authorized by: Olene Floss, PA-C   Critical care provider statement:    Critical care time (minutes):  30   Critical care was necessary to treat or prevent imminent or life-threatening deterioration of the following conditions:  CNS failure or compromise and hepatic failure   Critical care was time spent personally by me on the following activities:  Development of treatment plan with patient or surrogate, discussions with consultants, evaluation of patient's response to treatment, examination of patient, ordering and review of laboratory studies, ordering and review of radiographic studies, ordering and performing treatments and interventions, pulse oximetry, re-evaluation of patient's condition and review of old charts     Medications Ordered in ED Medications  ciprofloxacin (CIPRO) tablet 500 mg (has no administration in time range)  furosemide (LASIX) tablet 40 mg (has no administration in time range)  spironolactone (ALDACTONE) tablet 25 mg (has no administration in time range)  midodrine (PROAMATINE) tablet 10 mg (has no administration in time range)  lactulose (CHRONULAC) 10 GM/15ML solution 20 g (has no administration in time range)  pantoprazole (PROTONIX) EC tablet 40 mg (has no administration in time range)  sucralfate (CARAFATE) 1  GM/10ML suspension 1 g (has no administration in time range)  insulin aspart (novoLOG) injection 0-15 Units (has no administration in time range)  insulin aspart (novoLOG) injection 0-5 Units (has no administration in time range)  ibuprofen (ADVIL) tablet 400 mg (has no administration in time range)  ondansetron (ZOFRAN) tablet 4 mg (has no administration in time range)    Or  ondansetron (ZOFRAN) injection 4 mg (has no administration in time range)  albuterol (PROVENTIL) (2.5 MG/3ML) 0.083% nebulizer solution 2.5 mg (has no administration  in time range)  ondansetron (ZOFRAN) injection 4 mg (4 mg Intravenous Given 04/24/23 1642)  lactulose (CHRONULAC) 10 GM/15ML solution 30 g (30 g Oral Given 04/24/23 1802)    ED Course/ Medical Decision Making/ A&P Clinical Course as of 04/24/23 1929  Tue Apr 24, 2023  1726 Hepatic encephalopathy.  Agree with management per PA. [CC]    Clinical Course User Index [CC] Glyn Ade, MD                                 Medical Decision Making  This patient is a 62 y.o. male who presents to the ED for concern of altered mental status, this involves an extensive number of treatment options, and is a complaint that carries with it a high risk of complications and morbidity. The emergent differential diagnosis prior to evaluation includes, but is not limited to,  CVA, seizure, hypotension, sepsis, hypoglycemia, hypoxic encephalopathy, metabolic encephalopathy, polypharmacy, substance abuse, developing dementia or alzheimers, meningitis, encephalitis, hypertensive emergency, other systemic infection, acute alcohol intoxication, acute alcohol or other drug withdrawal or psychiatric manifestation vs other . This is not an exhaustive differential.   Past Medical History / Co-morbidities / Social History: Ascites due to alcoholic cirrhosis, GERD, hypertension, esophageal varices, portal hypertensive gastropathy, previous sepsis  Additional history: Chart  reviewed. Pertinent results include: Reviewed lab work, imaging from recent previous hospital admissions, outpatient neurology and gastroenterology notes  Physical Exam: Physical exam performed. The pertinent findings include: Patient is alert and oriented to self and to situation but not to exact place or date, he has some asterixis on exam, he has distended abdomen, and scleral icterus  Lab Tests: I ordered, and personally interpreted labs.  The pertinent results include: CBC notable for mild hyponatremia, sodium 131, he has some protein calorie malnutrition, albumin 2.9, his total bilirubin is elevated at 2.9 although not significantly changed from baseline.  His ammonia is notably 147 compatible with hepatic encephalopathy.  Elevated PT/INR, and APTT likely secondary to his hepatic dysfunction.  CBC without leukocytosis, he does have some anemia, hemoglobin 10.   Medications: I ordered medication including lactulose for hepatic encephalopathy.  Consultations Obtained: I requested consultation with the hospitalist, spoke with Dr. Kirby Crigler,  and discussed lab and imaging findings as well as pertinent plan - they recommend: Admission for acute altered mental status secondary to hepatic encephalopathy   Disposition: After consideration of the diagnostic results and the patients response to treatment, I feel that patient would benefit from admission as discussed above.   I discussed this case with my attending physician Dr. Doran Durand who cosigned this note including patient's presenting symptoms, physical exam, and planned diagnostics and interventions. Attending physician stated agreement with plan or made changes to plan which were implemented.    Final Clinical Impression(s) / ED Diagnoses Final diagnoses:  Hepatic encephalopathy [facility])    Rx / DC Orders ED Discharge Orders     None         [place] 04/24/23 1929    Glyn Ade, MD 04/25/23 [phone]

## 2023-04-24 NOTE — ED Triage Notes (Signed)
Pt BIBA from grocery store, staff called EMS after discovering that pt seemed physically weak and altered mentally. Pt abd visibly distended. Pt has hx of cirrhosis.

## 2023-04-25 DIAGNOSIS — K7682 Hepatic encephalopathy: Secondary | ICD-10-CM | POA: Diagnosis not present

## 2023-04-25 DIAGNOSIS — I10 Essential (primary) hypertension: Secondary | ICD-10-CM | POA: Diagnosis not present

## 2023-04-25 DIAGNOSIS — E119 Type 2 diabetes mellitus without complications: Secondary | ICD-10-CM

## 2023-04-25 DIAGNOSIS — K7031 Alcoholic cirrhosis of liver with ascites: Secondary | ICD-10-CM | POA: Diagnosis present

## 2023-04-25 DIAGNOSIS — E871 Hypo-osmolality and hyponatremia: Secondary | ICD-10-CM | POA: Diagnosis present

## 2023-04-25 DIAGNOSIS — E1169 Type 2 diabetes mellitus with other specified complication: Secondary | ICD-10-CM | POA: Diagnosis not present

## 2023-04-25 LAB — GLUCOSE, CAPILLARY
Glucose-Capillary: 101 mg/dL — ABNORMAL HIGH (ref 70–99)
Glucose-Capillary: 96 mg/dL (ref 70–99)
Glucose-Capillary: 93 mg/dL (ref 70–99)
Glucose-Capillary: 90 mg/dL (ref 70–99)

## 2023-04-25 LAB — CBC
HCT: 30.8 % — ABNORMAL LOW (ref 39.0–52.0)
Hemoglobin: 10.1 g/dL — ABNORMAL LOW (ref 13.0–17.0)
MCH: 32.1 pg (ref 26.0–34.0)
MCHC: 32.8 g/dL (ref 30.0–36.0)
MCV: 97.8 fL (ref 80.0–100.0)
Platelets: 91 10*3/uL — ABNORMAL LOW (ref 150–400)
RBC: 3.15 MIL/uL — ABNORMAL LOW (ref 4.22–5.81)
RDW: 15.6 % — ABNORMAL HIGH (ref 11.5–15.5)
WBC: 4.7 10*3/uL (ref 4.0–10.5)
nRBC: 0 % (ref 0.0–0.2)

## 2023-04-25 LAB — URINALYSIS, ROUTINE W REFLEX MICROSCOPIC
Bilirubin Urine: NEGATIVE
Glucose, UA: NEGATIVE mg/dL
Hgb urine dipstick: NEGATIVE
Ketones, ur: NEGATIVE mg/dL
Leukocytes,Ua: NEGATIVE
Nitrite: NEGATIVE
Protein, ur: NEGATIVE mg/dL
Specific Gravity, Urine: 1.016 (ref 1.005–1.030)
pH: 6 (ref 5.0–8.0)

## 2023-04-25 LAB — COMPREHENSIVE METABOLIC PANEL
ALT: 20 U/L (ref 0–44)
AST: 48 U/L — ABNORMAL HIGH (ref 15–41)
Albumin: 2.8 g/dL — ABNORMAL LOW (ref 3.5–5.0)
Alkaline Phosphatase: 88 U/L (ref 38–126)
Anion gap: 7 (ref 5–15)
BUN: 18 mg/dL (ref 8–23)
CO2: 22 mmol/L (ref 22–32)
Calcium: 8.5 mg/dL — ABNORMAL LOW (ref 8.9–10.3)
Chloride: 100 mmol/L (ref 98–111)
Creatinine, Ser: 0.62 mg/dL (ref 0.61–1.24)
GFR, Estimated: >60 mL/min (ref >60)
Glucose, Bld: 97 mg/dL (ref 70–99)
Potassium: 4 mmol/L (ref 3.5–5.1)
Sodium: 129 mmol/L — ABNORMAL LOW (ref 135–145)
Total Bilirubin: 3 mg/dL — ABNORMAL HIGH (ref 0.3–1.2)
Total Protein: 6 g/dL — ABNORMAL LOW (ref 6.5–8.1)

## 2023-04-25 MED ORDER — FUROSEMIDE 20 MG PO TABS
20.0000 mg | ORAL_TABLET | Freq: Two times a day (BID) | ORAL | Status: DC
  Administered 2023-04-25: 20 mg via ORAL
  Filled 2023-04-25: qty 1

## 2023-04-25 MED ORDER — RIFAXIMIN 550 MG PO TABS
550.0000 mg | ORAL_TABLET | Freq: Two times a day (BID) | ORAL | Status: DC
  Administered 2023-04-25 (×2): 550 mg via ORAL
  Filled 2023-04-25 (×3): qty 1

## 2023-04-25 MED ORDER — RIFAXIMIN 200 MG PO TABS
200.0000 mg | ORAL_TABLET | Freq: Two times a day (BID) | ORAL | Status: DC
  Filled 2023-04-25: qty 1

## 2023-04-25 NOTE — Assessment & Plan Note (Signed)
Not a transplant candidate. Overall poor prognosis. Pt lives alone. He is separated from his wife [name]. I called her today. Will consult palliative care for GOC/QOL discussion. I do forsee a bleak outlook for this patient. This is his 3rd admission this year for hepatic encephalopathy. Pt is not a TIPS candidate per Dr. Carrie Mew.

## 2023-04-25 NOTE — Progress Notes (Signed)
Mobility Specialist - Progress Note   04/25/23 1044  Mobility  Activity Transferred from bed to chair  Level of Assistance Standby assist, set-up cues, supervision of patient - no hands on  Assistive Device None  Distance Ambulated (ft) 5 ft  Range of Motion/Exercises Active  Activity Response Tolerated well  Mobility Referral Yes  $Mobility charge 1 Mobility  Mobility Specialist Start Time (ACUTE ONLY) 1030  Mobility Specialist Stop Time (ACUTE ONLY) 1044  Mobility Specialist Time Calculation (min) (ACUTE ONLY) 14 min   Pt received in bed trying to get out complaining of back pain, assured we'd check on pain meds even though he recently received them.  Pt seemed confused and slow to respond, but listened to certain commands well.  STS was standby and upon standing pt looked around, slowly walked to chair. Sat down and was left with all needs met and alarm on.  Marilynne Halsted Mobility Specialist

## 2023-04-25 NOTE — Assessment & Plan Note (Signed)
Ascites present. Defer to GI/PCCM if pt needs paracentesis.

## 2023-04-25 NOTE — Subjective & Objective (Signed)
Pt seen and examined. Events of last night noted. Code stroke called due to flaccid UE/LE. CT head negative  Obtunded this AM. Transferred to ICU. Proceeded to vomit bile on arrival to ICU. RN pretty sure that pt aspirated.  GI([facility]) consulted for decompensated cirrhosis.  Have consulted PCCM. Pt may need intubation.  Awaiting palliative care consult.  Attempted to call pt's wife sylvia @ [phone] (twice called) without answering her phone.

## 2023-04-25 NOTE — Plan of Care (Signed)
Problem: Education: Goal: Knowledge of General Education information will improve Description: Including pain rating scale, medication(s)/side effects and non-pharmacologic comfort measures Outcome: Progressing   Problem: Clinical Measurements: Goal: Ability to maintain clinical measurements within normal limits will improve Outcome: Progressing   Problem: Coping: Goal: Level of anxiety will decrease Outcome: Progressing   Problem: Skin Integrity: Goal: Risk for impaired skin integrity will decrease Outcome: Progressing

## 2023-04-25 NOTE — Hospital Course (Signed)
HPI: Nathan Roberts is a 62 y.o. 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  Patient was actually admitted to [facility] 8/21 to 8/30 for refractory ascites, during which time he underwent EGD which showed diffuse portal hypertensive gastropathy and grade 1 esophageal varices.  On review of his chart, it seems he is followed at [facility] by Dr. Carrie Mew of gastroenterology, he has had multiple therapeutic paracentesis including most recently 13 L paracentesis on 9/20.  It seems that TIPS procedure has been recommended, but the patient is currently declining due to concern for side effects.  Currently the patient seems to be somewhat confused unable to provide much of a history, according to gastroenterology notes he has been noncompliant with various medications so it is unclear whether he has been taking his lactulose.  Today he was at the grocery store and EMS was called since he was acting very confused.  Currently the patient is ambulating without difficulty in the room, he knows where he is, tells me that he has been feeling generally well, has no complaints but admits to feeling confused lately.  Denies abdominal pain, nausea or fevers.  On evaluation in the emergency department, his vital signs are stable and normal, lab work reveals stable hemoglobin of 10, platelets 101, sodium 131, normal renal function, ammonia of 147.  He was given a dose of oral lactulose, and hospitalist admission was requested.   Significant Events: Admitted 04/24/2023 for acute hepatic encephalopathy Transferred to ICU on 04-26-2023 for worsening hepatic encephalopathy  Significant Labs: Admitting NH3 level 147 04-26-2023 NH3 level 213 despite po lactulose, po xifaxan, rectal  lactulose  Significant Imaging Studies:   Antibiotic Therapy: Anti-infectives (From admission, onward)    Start     Dose/Rate Route Frequency Ordered Stop   04/25/23 1045  rifaximin (XIFAXAN) tablet 550 mg        550 mg Oral 2 times daily 04/25/23 1023     04/25/23 1000  rifaximin (XIFAXAN) tablet 200 mg  Status:  Discontinued        200 mg Oral 2 times daily 04/25/23 0722 04/25/23 1023   04/25/23 0800  ciprofloxacin (CIPRO) tablet 500 mg        500 mg Oral Daily with breakfast 04/24/23 1900         Procedures:   Consultants: GI PCCM

## 2023-04-25 NOTE — Assessment & Plan Note (Signed)
Continue SSI.

## 2023-04-25 NOTE — Assessment & Plan Note (Signed)
Stable. Due to cirrhosis.

## 2023-04-25 NOTE — Progress Notes (Addendum)
PROGRESS NOTE    Nathan BLOYD  FAO:[id number] DOB: 04-13-1961 DOA: May 23, 2023 PCP: Clayborne Dana, NP  Subjective: Pt seen and examined. Confused. Called pt's wife. They are separated. She lives in [place], [place]. Pt currently living alone. Not managing his medications correctly. Unknown if he is still drinking etoh.   Hospital Course: HPI: Nathan Roberts is a 62 y.o. 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  Patient was actually admitted to [facility] 8/21 to 8/30 for refractory ascites, during which time he underwent EGD which showed diffuse portal hypertensive gastropathy and grade 1 esophageal varices.  On review of his chart, it seems he is followed at [facility] by Dr. Carrie Mew of gastroenterology, he has had multiple therapeutic paracentesis including most recently 13 L paracentesis on 9/20.  It seems that TIPS procedure has been recommended, but the patient is currently declining due to concern for side effects.  Currently the patient seems to be somewhat confused unable to provide much of a history, according to gastroenterology notes he has been noncompliant with various medications so it is unclear whether he has been taking his lactulose.  Today he was at the grocery store and EMS was called since he was acting very confused.  Currently the patient is ambulating without difficulty in the room, he knows where he is, tells me that he has been feeling generally well, has no complaints but admits to feeling confused lately.  Denies abdominal pain, nausea or fevers.  On evaluation in the emergency department, his vital signs are stable and normal, lab work reveals stable hemoglobin of 10, platelets 101, sodium 131, normal renal function,  ammonia of 147.  He was given a dose of oral lactulose, and hospitalist admission was requested.   Significant Events: Admitted 23-May-2023 for acute hepatic encephalopathy   Significant Labs: Admitting NH3 level 147  Significant Imaging Studies:   Antibiotic Therapy: Anti-infectives (From admission, onward)    Start     Dose/Rate Route Frequency Ordered Stop   04/25/23 1045  rifaximin (XIFAXAN) tablet 550 mg        550 mg Oral 2 times daily 04/25/23 1023     04/25/23 1000  rifaximin (XIFAXAN) tablet 200 mg  Status:  Discontinued        200 mg Oral 2 times daily 04/25/23 0722 04/25/23 1023   04/25/23 0800  ciprofloxacin (CIPRO) tablet 500 mg        500 mg Oral Daily with breakfast 2023/05/23 1900         Procedures:   Consultants:     Assessment and Plan: * Hepatic encephalopathy (HCC) Admitted for hepatic encephalopathy. Pt with known non-compliance with medical therapy. Pt sees Dr. Carrie Mew with [facility]. Pt not a transplant candidate.  Continue with lactulose and Xifaxin treatment. On po cipro for SBP prophylaxis.  Alcoholic cirrhosis of liver with ascites (HCC) Not a transplant candidate. Overall poor prognosis. Pt lives alone. He is separated from his wife Nathan Roberts. I called her today. Will consult palliative care for GOC/QOL discussion. I do forsee a bleak outlook for this patient. This is his 3rd admission this year for hepatic encephalopathy. Pt is not a TIPS candidate per Dr. Carrie Mew.  Diabetes mellitus (HCC) Continue SSI  Ascites due to alcoholic cirrhosis (HCC) Abd is not tense. No indication for paracentesis at this  point.  Hypertension Hold Inderal for now. Was for portal hypertension.  Chronic hyponatremia - baseline SCr 124-131 Stable. Due to cirrhosis.   DVT prophylaxis: SCDs Start: May 22, 2023 1901     Code Status: Full Code Family Communication: called pt's separated wife Nathan Roberts on phone. She was not aware of pt's admission to hospital.  Discussed palliative care consult. Disposition Plan: may need hospice vs SNF Reason for continuing need for hospitalization: monitoring NH3 levels. Continue with xifaxin and lactulose  Objective: Vitals:   04/25/23 0134 04/25/23 0502 04/25/23 0841 04/25/23 1222  BP: 121/77 105/72 (!) 101/56 (!) 89/48  Pulse: 74 (!) 57 75 77  Resp: 18 18    Temp: 98 F (36.7 C) 97.8 F (36.6 C) 97.9 F (36.6 C) (!) 97.5 F (36.4 C)  TempSrc:    Oral  SpO2: 100% 100%  100%  Weight:      Height:       No intake or output data in the 24 hours ending 04/25/23 1329 Filed Weights   22-May-2023 1549  Weight: 79.2 kg    Examination:  Physical Exam Vitals and nursing note reviewed.  Constitutional:      Comments: Chronically ill appearing  HENT:     Head: Normocephalic and atraumatic.     Nose: No congestion or rhinorrhea.  Cardiovascular:     Rate and Rhythm: Normal rate and regular rhythm.  Pulmonary:     Effort: Pulmonary effort is normal.  Abdominal:     General: There is distension.     Palpations: Abdomen is soft. There is fluid wave.     Tenderness: There is no abdominal tenderness. There is no guarding or rebound.     Comments: +ascites. Not tense +umbilical hernia  Musculoskeletal:     Right lower leg: Edema present.     Left lower leg: Edema present.  Skin:    Capillary Refill: Capillary refill takes less than 2 seconds.  Neurological:     Mental Status: He is disoriented.     Data Reviewed: I have personally reviewed following labs and imaging studies  CBC: Recent Labs  Lab 22-May-2023 1600 04/25/23 0542  WBC 4.7 4.7  HGB 10.0* 10.1*  HCT 31.1* 30.8*  MCV 99.4 97.8  PLT 101* 91*   Basic Metabolic Panel: Recent Labs  Lab 05/22/2023 1600 04/25/23 0542  NA 131* 129*  K 4.1 4.0  CL 99 100  CO2 24 22  GLUCOSE 126* 97  BUN 18 18  CREATININE 0.65 0.62  CALCIUM 8.5* 8.5*   GFR: Estimated Creatinine Clearance: 90.3 mL/min (by C-G formula based on SCr of 0.62  mg/dL). Liver Function Tests: Recent Labs  Lab May 22, 2023 1600 04/25/23 0542  AST 54* 48*  ALT 22 20  ALKPHOS 99 88  BILITOT 2.9* 3.0*  PROT 6.5 6.0*  ALBUMIN 2.9* 2.8*    Recent Labs  Lab 05-22-23 1600  AMMONIA 147*   Coagulation Profile: Recent Labs  Lab May 22, 2023 1600  INR 1.7*   CBG: Recent Labs  Lab 05-22-2023 2128 04/25/23 0812 04/25/23 1225  GLUCAP 107* 101* 96    Radiology Studies: No results found.  Scheduled Meds:  ciprofloxacin  500 mg Oral Q breakfast   furosemide  40 mg Oral BID   insulin aspart  0-15 Units Subcutaneous TID WC   insulin aspart  0-5 Units Subcutaneous QHS   lactulose  20 g Oral TID   midodrine  10 mg Oral TID PC   pantoprazole  40 mg Oral BID  AC   rifaximin  550 mg Oral BID   spironolactone  25 mg Oral Daily   sucralfate  1 g Oral BID   Continuous Infusions:   LOS: 1 day   Time spent: 45 minutes  Carollee Herter, DO  [facility]  04/25/2023, 1:29 PM

## 2023-04-25 NOTE — Assessment & Plan Note (Signed)
Admitted for hepatic encephalopathy. Pt with known non-compliance with medical therapy. Pt sees Dr. Carrie Mew with [facility]. Pt not a transplant candidate.  Acute decompensated last night/early this AM(04-26-23). Moved to ICU. Pt now critically ill. Will consult GI([facility]) and PCCM. NH3 rising despite xifaxin, po lactulose and rectal lactulose. Keep NPO for now. Check CBC, procal

## 2023-04-25 NOTE — Assessment & Plan Note (Signed)
Hold Inderal for now. Was for portal hypertension.

## 2023-04-26 ENCOUNTER — Inpatient Hospital Stay (HOSPITAL_COMMUNITY): Payer: 59

## 2023-04-26 ENCOUNTER — Other Ambulatory Visit: Payer: Self-pay

## 2023-04-26 DIAGNOSIS — Z515 Encounter for palliative care: Secondary | ICD-10-CM | POA: Diagnosis not present

## 2023-04-26 DIAGNOSIS — K7031 Alcoholic cirrhosis of liver with ascites: Secondary | ICD-10-CM | POA: Diagnosis not present

## 2023-04-26 DIAGNOSIS — J9600 Acute respiratory failure, unspecified whether with hypoxia or hypercapnia: Secondary | ICD-10-CM | POA: Diagnosis not present

## 2023-04-26 DIAGNOSIS — Z7189 Other specified counseling: Secondary | ICD-10-CM

## 2023-04-26 DIAGNOSIS — K7682 Hepatic encephalopathy: Secondary | ICD-10-CM | POA: Diagnosis not present

## 2023-04-26 DIAGNOSIS — E871 Hypo-osmolality and hyponatremia: Secondary | ICD-10-CM

## 2023-04-26 LAB — GLUCOSE, CAPILLARY
Glucose-Capillary: 100 mg/dL — ABNORMAL HIGH (ref 70–99)
Glucose-Capillary: 113 mg/dL — ABNORMAL HIGH (ref 70–99)
Glucose-Capillary: 118 mg/dL — ABNORMAL HIGH (ref 70–99)
Glucose-Capillary: 122 mg/dL — ABNORMAL HIGH (ref 70–99)
Glucose-Capillary: 97 mg/dL (ref 70–99)
Glucose-Capillary: 97 mg/dL (ref 70–99)

## 2023-04-26 LAB — CBC WITH DIFFERENTIAL/PLATELET
Abs Immature Granulocytes: 0.03 10*3/uL (ref 0.00–0.07)
Basophils Absolute: 0 10*3/uL (ref 0.0–0.1)
Basophils Relative: 0 %
Eosinophils Absolute: 0 10*3/uL (ref 0.0–0.5)
Eosinophils Relative: 0 %
HCT: 35.5 % — ABNORMAL LOW (ref 39.0–52.0)
Hemoglobin: 11.5 g/dL — ABNORMAL LOW (ref 13.0–17.0)
Immature Granulocytes: 0 %
Lymphocytes Relative: 10 %
Lymphs Abs: 0.8 10*3/uL (ref 0.7–4.0)
MCH: 32.3 pg (ref 26.0–34.0)
MCHC: 32.4 g/dL (ref 30.0–36.0)
MCV: 99.7 fL (ref 80.0–100.0)
Monocytes Absolute: 1.5 10*3/uL — ABNORMAL HIGH (ref 0.1–1.0)
Monocytes Relative: 18 %
Neutro Abs: 6 10*3/uL (ref 1.7–7.7)
Neutrophils Relative %: 72 %
Platelets: 109 10*3/uL — ABNORMAL LOW (ref 150–400)
RBC: 3.56 MIL/uL — ABNORMAL LOW (ref 4.22–5.81)
RDW: 15.6 % — ABNORMAL HIGH (ref 11.5–15.5)
WBC: 8.4 10*3/uL (ref 4.0–10.5)
nRBC: 0 % (ref 0.0–0.2)

## 2023-04-26 LAB — COMPREHENSIVE METABOLIC PANEL
ALT: 23 U/L (ref 0–44)
AST: 51 U/L — ABNORMAL HIGH (ref 15–41)
Albumin: 3 g/dL — ABNORMAL LOW (ref 3.5–5.0)
Alkaline Phosphatase: 82 U/L (ref 38–126)
Anion gap: 10 (ref 5–15)
BUN: 23 mg/dL (ref 8–23)
CO2: 21 mmol/L — ABNORMAL LOW (ref 22–32)
Calcium: 9 mg/dL (ref 8.9–10.3)
Chloride: 101 mmol/L (ref 98–111)
Creatinine, Ser: 0.85 mg/dL (ref 0.61–1.24)
GFR, Estimated: >60 mL/min (ref >60)
Glucose, Bld: 98 mg/dL (ref 70–99)
Potassium: 4.6 mmol/L (ref 3.5–5.1)
Sodium: 132 mmol/L — ABNORMAL LOW (ref 135–145)
Total Bilirubin: 4.7 mg/dL — ABNORMAL HIGH (ref 0.3–1.2)
Total Protein: 6.7 g/dL (ref 6.5–8.1)

## 2023-04-26 LAB — BLOOD GAS, ARTERIAL
Acid-base deficit: 0.9 mmol/L (ref 0.0–2.0)
Bicarbonate: 21.8 mmol/L (ref 20.0–28.0)
Drawn by: 29503
FIO2: 100 %
MECHVT: 550 mL
O2 Saturation: 100 %
PEEP: 5 cmH2O
Patient temperature: 37
RATE: 16 resp/min
pCO2 arterial: 30 mmHg — ABNORMAL LOW (ref 32–48)
pH, Arterial: 7.47 — ABNORMAL HIGH (ref 7.35–7.45)
pO2, Arterial: 457 mmHg — ABNORMAL HIGH (ref 83–108)

## 2023-04-26 LAB — PROTIME-INR
INR: 1.6 — ABNORMAL HIGH (ref 0.8–1.2)
Prothrombin Time: 19.6 seconds — ABNORMAL HIGH (ref 11.4–15.2)

## 2023-04-26 LAB — PROCALCITONIN: Procalcitonin: 0.28 ng/mL

## 2023-04-26 LAB — AMMONIA: Ammonia: 213 umol/L — ABNORMAL HIGH (ref 9–35)

## 2023-04-26 LAB — TSH: TSH: 3.071 u[IU]/mL (ref 0.350–4.500)

## 2023-04-26 LAB — MRSA NEXT GEN BY PCR, NASAL: MRSA by PCR Next Gen: NOT DETECTED

## 2023-04-26 MED ORDER — ROCURONIUM BROMIDE 10 MG/ML (PF) SYRINGE
PREFILLED_SYRINGE | INTRAVENOUS | Status: AC
  Administered 2023-04-26: 50 mg
  Filled 2023-04-26: qty 10

## 2023-04-26 MED ORDER — DOCUSATE SODIUM 50 MG/5ML PO LIQD
100.0000 mg | Freq: Two times a day (BID) | ORAL | Status: DC
  Administered 2023-04-26 – 2023-04-29 (×7): 100 mg
  Filled 2023-04-26 (×7): qty 10

## 2023-04-26 MED ORDER — PNEUMOCOCCAL 20-VAL CONJ VACC 0.5 ML IM SUSY: 0.5000 mL | PREFILLED_SYRINGE | INTRAMUSCULAR | Status: DC | PRN

## 2023-04-26 MED ORDER — FENTANYL CITRATE PF 50 MCG/ML IJ SOSY
25.0000 ug | PREFILLED_SYRINGE | INTRAMUSCULAR | Status: DC | PRN
  Filled 2023-04-26 (×2): qty 1

## 2023-04-26 MED ORDER — ORAL CARE MOUTH RINSE
15.0000 mL | OROMUCOSAL | Status: DC
  Administered 2023-04-26 – 2023-05-04 (×96): 15 mL via OROMUCOSAL

## 2023-04-26 MED ORDER — DEXMEDETOMIDINE HCL IN NACL 200 MCG/50ML IV SOLN
0.0000 ug/kg/h | INTRAVENOUS | Status: DC
  Administered 2023-04-26: 0.4 ug/kg/h via INTRAVENOUS
  Filled 2023-04-26: qty 50

## 2023-04-26 MED ORDER — FENTANYL CITRATE (PF) 100 MCG/2ML IJ SOLN
INTRAMUSCULAR | Status: AC
  Administered 2023-04-26: 100 ug
  Filled 2023-04-26: qty 2

## 2023-04-26 MED ORDER — LACTULOSE ENEMA
300.0000 mL | Freq: Once | ORAL | Status: AC
  Administered 2023-04-26: 300 mL via RECTAL
  Filled 2023-04-26: qty 300

## 2023-04-26 MED ORDER — SUCRALFATE 1 GM/10ML PO SUSP
1.0000 g | Freq: Two times a day (BID) | ORAL | Status: DC
  Administered 2023-04-26 – 2023-05-04 (×16): 1 g
  Filled 2023-04-26 (×16): qty 10

## 2023-04-26 MED ORDER — MIDAZOLAM HCL 2 MG/2ML IJ SOLN
1.0000 mg | INTRAMUSCULAR | Status: DC | PRN
  Administered 2023-04-26 (×2): 2 mg via INTRAVENOUS
  Administered 2023-04-26: 1 mg via INTRAVENOUS
  Administered 2023-04-27 – 2023-04-29 (×6): 2 mg via INTRAVENOUS
  Administered 2023-04-29: 1 mg via INTRAVENOUS
  Filled 2023-04-26 (×10): qty 2

## 2023-04-26 MED ORDER — ETOMIDATE 2 MG/ML IV SOLN
INTRAVENOUS | Status: AC
  Administered 2023-04-26: 20 mg
  Filled 2023-04-26: qty 20

## 2023-04-26 MED ORDER — MIDODRINE HCL 5 MG PO TABS: 10.0000 mg | ORAL_TABLET | Freq: Three times a day (TID) | ORAL | Status: DC

## 2023-04-26 MED ORDER — LACTULOSE 10 GM/15ML PO SOLN
30.0000 g | Freq: Three times a day (TID) | ORAL | Status: DC
  Administered 2023-04-26 (×3): 30 g
  Filled 2023-04-26 (×3): qty 45

## 2023-04-26 MED ORDER — ORAL CARE MOUTH RINSE: 15.0000 mL | OROMUCOSAL | Status: DC | PRN

## 2023-04-26 MED ORDER — SODIUM CHLORIDE 0.9 % IV SOLN: INTRAVENOUS | Status: DC

## 2023-04-26 MED ORDER — PANTOPRAZOLE SODIUM 40 MG IV SOLR
40.0000 mg | Freq: Every day | INTRAVENOUS | Status: DC
  Administered 2023-04-26 – 2023-05-01 (×6): 40 mg via INTRAVENOUS
  Filled 2023-04-26 (×6): qty 10

## 2023-04-26 MED ORDER — FAMOTIDINE 20 MG PO TABS: 20.0000 mg | ORAL_TABLET | Freq: Two times a day (BID) | ORAL | Status: DC

## 2023-04-26 MED ORDER — SODIUM CHLORIDE (PF) 0.9 % IJ SOLN
INTRAMUSCULAR | Status: AC
  Filled 2023-04-26: qty 100

## 2023-04-26 MED ORDER — RIFAXIMIN 550 MG PO TABS
550.0000 mg | ORAL_TABLET | Freq: Two times a day (BID) | ORAL | Status: DC
  Administered 2023-04-26 – 2023-05-04 (×16): 550 mg
  Filled 2023-04-26 (×16): qty 1

## 2023-04-26 MED ORDER — FUROSEMIDE 20 MG PO TABS
20.0000 mg | ORAL_TABLET | Freq: Two times a day (BID) | ORAL | Status: DC
  Administered 2023-04-26 – 2023-05-01 (×11): 20 mg
  Filled 2023-04-26 (×11): qty 1

## 2023-04-26 MED ORDER — CHLORHEXIDINE GLUCONATE CLOTH 2 % EX PADS
6.0000 | MEDICATED_PAD | Freq: Every day | CUTANEOUS | Status: DC
  Administered 2023-04-26 – 2023-05-03 (×8): 6 via TOPICAL

## 2023-04-26 MED ORDER — PHENYLEPHRINE 80 MCG/ML (10ML) SYRINGE FOR IV PUSH (FOR BLOOD PRESSURE SUPPORT)
PREFILLED_SYRINGE | INTRAVENOUS | Status: AC
  Filled 2023-04-26: qty 10

## 2023-04-26 MED ORDER — INFLUENZA VIRUS VACC SPLIT PF (FLUZONE) 0.5 ML IM SUSY: 0.5000 mL | PREFILLED_SYRINGE | INTRAMUSCULAR | Status: DC | PRN

## 2023-04-26 MED ORDER — SODIUM CHLORIDE (PF) 0.9 % IJ SOLN
INTRAMUSCULAR | Status: AC
  Filled 2023-04-26: qty 50

## 2023-04-26 MED ORDER — FENTANYL CITRATE PF 50 MCG/ML IJ SOSY
25.0000 ug | PREFILLED_SYRINGE | INTRAMUSCULAR | Status: DC | PRN
  Administered 2023-04-26 (×4): 100 ug via INTRAVENOUS
  Administered 2023-04-27 (×3): 50 ug via INTRAVENOUS
  Administered 2023-04-29: 100 ug via INTRAVENOUS
  Administered 2023-04-29: 50 ug via INTRAVENOUS
  Administered 2023-04-29: 100 ug via INTRAVENOUS
  Administered 2023-04-29: 50 ug via INTRAVENOUS
  Administered 2023-04-30 (×4): 100 ug via INTRAVENOUS
  Administered 2023-04-30 (×3): 50 ug via INTRAVENOUS
  Administered 2023-04-30 (×3): 100 ug via INTRAVENOUS
  Administered 2023-04-30: 50 ug via INTRAVENOUS
  Administered 2023-04-30: 100 ug via INTRAVENOUS
  Administered 2023-04-30 (×2): 50 ug via INTRAVENOUS
  Administered 2023-04-30: 100 ug via INTRAVENOUS
  Administered 2023-05-01 – 2023-05-04 (×11): 50 ug via INTRAVENOUS
  Filled 2023-04-26 (×2): qty 1
  Filled 2023-04-26: qty 2
  Filled 2023-04-26 (×3): qty 1
  Filled 2023-04-26 (×3): qty 2
  Filled 2023-04-26 (×2): qty 1
  Filled 2023-04-26 (×2): qty 2
  Filled 2023-04-26: qty 1
  Filled 2023-04-26: qty 2
  Filled 2023-04-26 (×4): qty 1
  Filled 2023-04-26 (×2): qty 2
  Filled 2023-04-26 (×2): qty 1
  Filled 2023-04-26 (×3): qty 2
  Filled 2023-04-26 (×3): qty 1
  Filled 2023-04-26 (×3): qty 2
  Filled 2023-04-26 (×3): qty 1
  Filled 2023-04-26 (×2): qty 2
  Filled 2023-04-26 (×2): qty 1
  Filled 2023-04-26: qty 2

## 2023-04-26 MED ORDER — METOCLOPRAMIDE HCL 5 MG/ML IJ SOLN
10.0000 mg | Freq: Once | INTRAMUSCULAR | Status: AC
  Administered 2023-04-26: 10 mg via INTRAVENOUS
  Filled 2023-04-26: qty 2

## 2023-04-26 MED ORDER — MIDAZOLAM HCL 2 MG/2ML IJ SOLN
INTRAMUSCULAR | Status: AC
  Administered 2023-04-26: 2 mg via INTRAVENOUS
  Filled 2023-04-26: qty 2

## 2023-04-26 MED ORDER — SPIRONOLACTONE 25 MG PO TABS
25.0000 mg | ORAL_TABLET | Freq: Every day | ORAL | Status: DC
  Administered 2023-04-27 – 2023-05-03 (×7): 25 mg
  Filled 2023-04-26 (×7): qty 1

## 2023-04-26 MED ORDER — POLYETHYLENE GLYCOL 3350 17 G PO PACK
17.0000 g | PACK | Freq: Every day | ORAL | Status: DC
  Administered 2023-04-26 – 2023-04-29 (×4): 17 g
  Filled 2023-04-26 (×4): qty 1

## 2023-04-26 MED ORDER — INSULIN ASPART 100 UNIT/ML IJ SOLN
0.0000 [IU] | INTRAMUSCULAR | Status: DC
  Administered 2023-04-26: 2 [IU] via SUBCUTANEOUS
  Administered 2023-04-27 (×2): 3 [IU] via SUBCUTANEOUS
  Administered 2023-04-27 – 2023-04-28 (×4): 2 [IU] via SUBCUTANEOUS
  Administered 2023-04-28: 3 [IU] via SUBCUTANEOUS
  Administered 2023-04-28 – 2023-04-29 (×6): 2 [IU] via SUBCUTANEOUS
  Administered 2023-04-30: 3 [IU] via SUBCUTANEOUS
  Administered 2023-04-30: 2 [IU] via SUBCUTANEOUS
  Administered 2023-04-30: 3 [IU] via SUBCUTANEOUS
  Administered 2023-04-30 – 2023-05-01 (×4): 2 [IU] via SUBCUTANEOUS
  Administered 2023-05-01 – 2023-05-02 (×7): 3 [IU] via SUBCUTANEOUS
  Administered 2023-05-02: 2 [IU] via SUBCUTANEOUS
  Administered 2023-05-02 (×2): 3 [IU] via SUBCUTANEOUS
  Administered 2023-05-03: 2 [IU] via SUBCUTANEOUS
  Administered 2023-05-03: 3 [IU] via SUBCUTANEOUS
  Administered 2023-05-04: 2 [IU] via SUBCUTANEOUS

## 2023-04-26 NOTE — Progress Notes (Signed)
PROGRESS NOTE    Nathan Roberts  CZY:[id number] DOB: 09-30-1960 DOA: 05-11-23 PCP: Nathan Dana, NP  Subjective: Pt seen and examined. Events of last night noted. Code stroke called due to flaccid UE/LE. CT head negative  Obtunded this AM. Transferred to ICU. Proceeded to vomit bile on arrival to ICU. RN pretty sure that pt aspirated.  GI(Nathan Roberts) consulted for decompensated cirrhosis.  Have consulted PCCM. Pt may need intubation.  Awaiting palliative care consult.  Attempted to call pt's wife Nathan Roberts @ [phone] (twice called) without answering her phone.   Hospital Course: HPI: Nathan Roberts is a 62 y.o. 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  Patient was actually admitted to [facility] 8/21 to 8/30 for refractory ascites, during which time he underwent EGD which showed diffuse portal hypertensive gastropathy and grade 1 esophageal varices.  On review of his chart, it seems he is followed at [facility] by Nathan Roberts of gastroenterology, he has had multiple therapeutic paracentesis including most recently 13 L paracentesis on 9/20.  It seems that TIPS procedure has been recommended, but the patient is currently declining due to concern for side effects.  Currently the patient seems to be somewhat confused unable to provide much of a history, according to gastroenterology notes he has been noncompliant with various medications so it is unclear whether he has been taking his lactulose.  Today he was at the grocery store and EMS was called since he was acting very confused.  Currently the patient is ambulating without difficulty in the room, he knows where he is, tells me that he has been feeling generally well, has no complaints but  admits to feeling confused lately.  Denies abdominal pain, nausea or fevers.  On evaluation in the emergency department, his vital signs are stable and normal, lab work reveals stable hemoglobin of 10, platelets 101, sodium 131, normal renal function, ammonia of 147.  He was given a dose of oral lactulose, and hospitalist admission was requested.   Significant Events: Admitted 05-11-23 for acute hepatic encephalopathy Transferred to ICU on 04-26-2023 for worsening hepatic encephalopathy  Significant Labs: Admitting NH3 level 147 04-26-2023 NH3 level 213 despite po lactulose, po xifaxan, rectal lactulose  Significant Imaging Studies:   Antibiotic Therapy: Anti-infectives (From admission, onward)    Start     Dose/Rate Route Frequency Ordered Stop   04/25/23 1045  rifaximin (XIFAXAN) tablet 550 mg        550 mg Oral 2 times daily 04/25/23 1023     04/25/23 1000  rifaximin (XIFAXAN) tablet 200 mg  Status:  Discontinued        200 mg Oral 2 times daily 04/25/23 0722 04/25/23 1023   04/25/23 0800  ciprofloxacin (CIPRO) tablet 500 mg        500 mg Oral Daily with breakfast 05-11-23 1900         Procedures:   Consultants: GI PCCM    Assessment and Plan: * Hepatic encephalopathy (HCC) Admitted for hepatic encephalopathy. Pt with known non-compliance with medical therapy. Pt sees Nathan Roberts with [facility]. Pt not a transplant candidate.  Acute decompensated last night/early this AM(04-26-23). Moved to ICU. Pt now critically ill. Will consult GI(Nathan Roberts) and PCCM. NH3 rising despite xifaxin, po lactulose and rectal lactulose. Keep NPO for now. Check CBC, procal  Alcoholic cirrhosis of  liver with ascites [facility]) Not a transplant candidate. Overall poor prognosis. Pt lives alone. He is separated from his wife Nathan Roberts. I called her today. Have consulted palliative care for GOC/QOL discussion. I do forsee a bleak outlook for this patient. This is his 3rd admission this year for  hepatic encephalopathy. Pt is not a TIPS candidate per Nathan Roberts.  May need paracentesis to r/o SBP. Defer to PCCM/GI to decide  Diabetes mellitus (HCC) Continue SSI  Ascites due to alcoholic cirrhosis (HCC) Ascites present. Defer to GI/PCCM if pt needs paracentesis.  Hypertension Hold Inderal for now. Was for portal hypertension.  Chronic hyponatremia - baseline SCr 124-131 Stable. Due to cirrhosis.   DVT prophylaxis: SCDs Start: 04/23/2023 1901    Code Status: Full Code Family Communication: attempted to call pt's wife Nathan Roberts twice without answer @ [phone] Disposition Plan: unclear at this time Reason for continuing need for hospitalization: transferred to ICU due to hepatic decompensation.  Objective: Vitals:   04/25/23 1222 04/25/23 2122 04/26/23 0404 04/26/23 0639  BP: (!) 89/48 113/71 139/82 136/82  Pulse: 77 62 83 70  Resp:  18 18 18   Temp: (!) 97.5 F (36.4 C) (!) 97.4 F (36.3 C) (!) 97.4 F (36.3 C) (!) 97.5 F (36.4 C)  TempSrc: Oral Axillary Oral Axillary  SpO2: 100% 100% 98% 100%  Weight:      Height:        Intake/Output Summary (Last 24 hours) at 04/26/2023 0857 Last data filed at 04/26/2023 0500 Gross per 24 hour  Intake --  Output 605 ml  Net -605 ml   Filed Weights   04/07/2023 1549  Weight: 79.2 kg    Examination:  Physical Exam Vitals and nursing note reviewed.  Constitutional:      Appearance: He is ill-appearing.     Comments: Obtunded. Eye are open Not responsive to sternal rub  HENT:     Head: Normocephalic and atraumatic.  Eyes:     Pupils: Pupils are equal, round, and reactive to light.  Cardiovascular:     Rate and Rhythm: Normal rate.  Abdominal:     General: There is distension.     Palpations: Abdomen is soft. There is fluid wave.  Musculoskeletal:     Right lower leg: Edema present.     Left lower leg: Edema present.  Skin:    General: Skin is warm and dry.  Neurological:     Comments: obtunded     Data  Reviewed: I have personally reviewed following labs and imaging studies  CBC: Recent Labs  Lab 04/20/2023 1600 04/25/23 0542  WBC 4.7 4.7  HGB 10.0* 10.1*  HCT 31.1* 30.8*  MCV 99.4 97.8  PLT 101* 91*   Basic Metabolic Panel: Recent Labs  Lab 04/25/2023 1600 04/25/23 0542 04/26/23 0519  NA 131* 129* 132*  K 4.1 4.0 4.6  CL 99 100 101  CO2 24 22 21*  GLUCOSE 126* 97 98  BUN 18 18 23   CREATININE 0.65 0.62 0.85  CALCIUM 8.5* 8.5* 9.0   GFR: Estimated Creatinine Clearance: 84.9 mL/min (by C-G formula based on SCr of 0.85 mg/dL). Liver Function Tests: Recent Labs  Lab 04/01/2023 1600 04/25/23 0542 04/26/23 0519  AST 54* 48* 51*  ALT 22 20 23   ALKPHOS 99 88 82  BILITOT 2.9* 3.0* 4.7*  PROT 6.5 6.0* 6.7  ALBUMIN 2.9* 2.8* 3.0*    Recent Labs  Lab 04/22/2023 1600 04/26/23 0519  AMMONIA 147* 213*   Coagulation Profile: Recent  Labs  Lab 04/21/2023 1600  INR 1.7*   CBG: Recent Labs  Lab 04/25/23 1225 04/25/23 1726 04/25/23 2120 04/26/23 0538 04/26/23 0736  GLUCAP 96 93 90 97 97    Radiology Studies: CT HEAD CODE STROKE WO CONTRAST  Addendum Date: 04/26/2023   ADDENDUM REPORT: 04/26/2023 06:27 ADDENDUM: Study discussed by telephone with NP ABIGAIL CHAVEZ on 04/26/2023 at 0621 hours. Electronically Signed   By: Odessa Fleming M.D.   On: 04/26/2023 06:27   Result Date: 04/26/2023 CLINICAL DATA:  Code stroke.  62 year old male. EXAM: CT HEAD WITHOUT CONTRAST TECHNIQUE: Contiguous axial images were obtained from the base of the skull through the vertex without intravenous contrast. RADIATION DOSE REDUCTION: This exam was performed according to the departmental dose-optimization program which includes automated exposure control, adjustment of the mA and/or kV according to patient size and/or use of iterative reconstruction technique. COMPARISON:  Brain MRI 12/27/2011.  Head CT 03/21/2023. FINDINGS: Brain: Cerebral volume is normal for age. No midline shift, ventriculomegaly, mass  effect, evidence of mass lesion, intracranial hemorrhage or evidence of cortically based acute infarction. Mild for age cerebral white matter hypodensity, most pronounced in the periatrial white matter, is stable. Otherwise normal gray-white differentiation. Vascular: No suspicious intracranial vascular hyperdensity. Calcified atherosclerosis at the skull base. Skull: No acute osseous abnormality identified. Sinuses/Orbits: Sinuses are stable and well aerated. Small left maxillary and right sphenoid sinus retention cysts. Other: No gaze deviation. Visualized orbits and scalp soft tissues are within normal limits. ASPECTS [facility] Stroke Program Early CT Score) Total score (0-10 with 10 being normal): 10 IMPRESSION: No acute cortically based infarct or acute intracranial hemorrhage identified. ASPECTS 10. Electronically Signed: By: Odessa Fleming M.D. On: 04/26/2023 06:16    Scheduled Meds:  Chlorhexidine Gluconate Cloth  6 each Topical Daily   ciprofloxacin  500 mg Oral Q breakfast   furosemide  20 mg Oral BID   insulin aspart  0-15 Units Subcutaneous TID WC   insulin aspart  0-5 Units Subcutaneous QHS   lactulose  20 g Oral TID   midodrine  10 mg Oral TID PC   pantoprazole  40 mg Oral BID AC   rifaximin  550 mg Oral BID   spironolactone  25 mg Oral Daily   sucralfate  1 g Oral BID   Continuous Infusions:  sodium chloride 100 mL/hr at 04/26/23 0652     LOS: 2 days   Time spent: 45 minutes in critical care  Carollee Herter, DO  [facility]  04/26/2023, 8:57 AM

## 2023-04-26 NOTE — Consult Note (Signed)
NAME:  Nathan Roberts, MRN:  [id number], DOB:  1960/10/18, LOS: 2 ADMISSION DATE:  2023-05-20, CONSULTATION DATE: 9/26 REFERRING MD: Dr. Imogene Burn, CHIEF COMPLAINT: Hepatic encephalopathy  History of Present Illness:  Patient is encephalopathic and/or intubated; therefore, history has been obtained from chart review.  62 year old male with past medical history as below, which is significant for alcohol-related hepatic cirrhosis, ascites refractory to multiple paracentesis and diuretics, medical noncompliance, esophageal varices, and hepatic encephalopathy which had been well-controlled with medications.  Recently admitted to [facility] in late August for ascites.  He had a endoscopy at that time showing grade 1 esophageal varices.  He has been followed by both [facility], and GI at [facility]. Last note from [facility] recommended Lasix and possibly spiro for volume. Cipro 500 daily for SBP ppx, and sodium restricted diet. He was felt to be a poor candidate for transplant due to social issues, history of non-compliance, and functional status. TIPs, however, was not considered to be off the table as his HE was able to be controlled with medications. Patient has declined TIPS in the past. Most recent paracentesis 9/20 during which 13L was removed.   He presented to [facility] ED 2023-05-20 via EMS after he was noted to exhibit unusual behavior at the grocery store. He was oriented to self in the ED, but was otherwise confused. Ammonia level was noted to be high in correlation to his history of hepatic encephalopathy. He received a dose of lactulose in the ED and was admitted to the hospitalists for treatment of HE with lactulose and decompensated cirrhosis with diuretics and SBP ppx. However, in the overnight hours 9/25-26 he became increasingly lethargic and ammonia level climbed despite treatment. He was transferred to the ICU minimally responsive with poor protection of his airway and ultimately vomited. PCCM consulted  for consideration of intubation.   Pertinent  Medical History   has a past medical history of Allergy, Arthritis, Carpal tunnel syndrome, Diabetes mellitus without complication (HCC), Esophageal varices (HCC), GERD (gastroesophageal reflux disease), GI bleed, Hyperlipidemia, Hypertension, Pancreas divisum (03/22/2023), Portal hypertensive gastropathy (HCC), and Pre-diabetes.   Significant Hospital Events: Including procedures, antibiotic start and stop dates in addition to other pertinent events   05/20/23 presented with confusion. Admitted for treatment of HE 9/26 tx to ICU for lethargy, vomiting, aspiration concern. Intubated.   Interim History / Subjective:    Objective   Blood pressure 136/82, pulse 70, temperature (!) 97.5 F (36.4 C), temperature source Axillary, resp. rate 18, height 5\' 8"  (1.727 m), weight 79.2 kg, SpO2 100%.        Intake/Output Summary (Last 24 hours) at 04/26/2023 0946 Last data filed at 04/26/2023 0500 Gross per 24 hour  Intake --  Output 605 ml  Net -605 ml   Filed Weights   05-20-23 1549  Weight: 79.2 kg    Examination: General: Thin middle aged male in NAD HENT: [place]/AT, PERRL, no JVD Lungs: Clear bilateral breath sounds Cardiovascular: RRR, no MRG Abdomen: Protuberant, non-tender, hyperactive Extremities: No acute deformity  Neuro: Eyes open. No response. Not following commands  Resolved Hospital Problem list     Assessment & Plan:   Hepatic encephalopathy: secondary to alcoholic cirrhosis. History of same. Reportedly compliant with home lactulose.  - ICU monitoring. - Place gastric tube for lactulose and rifaximin administration - PRN sedation only for vent RASS goal 0 to -1.  - Trend ammonia - TSH, ABG pending to rule out alternate etiologies.   Acute respiratory failure  Inability to protect airway secondary to above.  Possible aspiration of emesis - Full vent support - CXR/ABG pending - VAP bundle - SBT when meets criteria -  defer antibiotics for now  Alcoholic cirrhosis: 9/26 MELD 18, MELD-Na 22, CP grade C Refractory ascites Portal HTN - diuresis with lasix, spironolactone as BP will tolerate - can consider paracentesis when more stable, no infectious indication tap (PCT pending) - Not currently at TIPs candidate with active HE, although hasn't been ruled out long term by primary GI.  - Continue cipro for SBP ppx - not a transplant candidate - Trend LFT/ammonia/bili/coags - holding propranolol until more stable - GI consult pending - Palliative medicine consult pending  Chronic hypotension:  - continue home midodrine for now (new medication at last discharge in August)  DM - CBG monitoring and SSI  Hyponatremia: chronic secondary to cirrhosis, stable - Trend chemistry   Goals of care: Dr. Imogene Burn was able to engage the patient's wife (separated) overnight who endorsed full code, however, I have been unable to contact her this morning. I will continue to attempt to get in touch with her for an update. Palliative medicine was consulted 9/25. Evaluation is pending.    Best Practice (right click and "Reselect all SmartList Selections" daily)   Diet/type: NPO DVT prophylaxis: not indicated GI prophylaxis: PPI Lines: N/A Foley:  N/A Code Status:  full code Last date of multidisciplinary goals of care discussion [  ]  Labs   CBC: Recent Labs  Lab 04/16/2023 1600 04/25/23 0542 04/26/23 0913  WBC 4.7 4.7 8.4  NEUTROABS  --   --  6.0  HGB 10.0* 10.1* 11.5*  HCT 31.1* 30.8* 35.5*  MCV 99.4 97.8 99.7  PLT 101* 91* 109*    Basic Metabolic Panel: Recent Labs  Lab 04/13/2023 1600 04/25/23 0542 04/26/23 0519  NA 131* 129* 132*  K 4.1 4.0 4.6  CL 99 100 101  CO2 24 22 21*  GLUCOSE 126* 97 98  BUN 18 18 23   CREATININE 0.65 0.62 0.85  CALCIUM 8.5* 8.5* 9.0   GFR: Estimated Creatinine Clearance: 84.9 mL/min (by C-G formula based on SCr of 0.85 mg/dL). Recent Labs  Lab 04/10/2023 1600  04/25/23 0542 04/26/23 0913  WBC 4.7 4.7 8.4    Liver Function Tests: Recent Labs  Lab 04/28/2023 1600 04/25/23 0542 04/26/23 0519  AST 54* 48* 51*  ALT 22 20 23   ALKPHOS 99 88 82  BILITOT 2.9* 3.0* 4.7*  PROT 6.5 6.0* 6.7  ALBUMIN 2.9* 2.8* 3.0*   No results for input(s): "LIPASE", "AMYLASE" in the last 168 hours. Recent Labs  Lab 04/30/2023 1600 04/26/23 0519  AMMONIA 147* 213*    ABG No results found for: "PHART", "PCO2ART", "PO2ART", "HCO3", "TCO2", "ACIDBASEDEF", "O2SAT"   Coagulation Profile: Recent Labs  Lab 04/03/2023 1600 04/26/23 0913  INR 1.7* 1.6*    Cardiac Enzymes: No results for input(s): "CKTOTAL", "CKMB", "CKMBINDEX", "TROPONINI" in the last 168 hours.  HbA1C: Hgb A1c MFr Bld  Date/Time Value Ref Range Status  03/22/2023 03:06 AM 4.6 (L) 4.8 - 5.6 % Final    Comment:    (NOTE) Pre diabetes:          5.7%-6.4%  Diabetes:              >6.4%  Glycemic control for   <7.0% adults with diabetes   12/22/2022 11:01 AM 4.8 4.6 - 6.5 % Final    Comment:    Glycemic Control Guidelines for People  with Diabetes:Non Diabetic:  <6%Goal of Therapy: <7%Additional Action Suggested:  >8%     CBG: Recent Labs  Lab 04/25/23 1225 04/25/23 1726 04/25/23 2120 04/26/23 0538 04/26/23 0736  GLUCAP 96 93 90 97 97    Review of Systems:   Patient is encephalopathic and/or intubated; therefore, history has been obtained from chart review.    Past Medical History:  He,  has a past medical history of Allergy, Arthritis, Carpal tunnel syndrome, Diabetes mellitus without complication (HCC), Esophageal varices (HCC), GERD (gastroesophageal reflux disease), GI bleed, Hyperlipidemia, Hypertension, Pancreas divisum (03/22/2023), Portal hypertensive gastropathy (HCC), and Pre-diabetes.   Surgical History:   Past Surgical History:  Procedure Laterality Date   abscess     rectal area   CARPAL TUNNEL RELEASE     left   CARPAL TUNNEL RELEASE Right 07/07/2021    Procedure: RIGHT CARPAL TUNNEL RELEASE;  Surgeon: Cindee Salt, MD;  Location: [facility];  Service: Orthopedics;  Laterality: Right;   CERVICAL FUSION     COLONOSCOPY WITH PROPOFOL N/A 05/29/2022   Procedure: COLONOSCOPY WITH PROPOFOL;  Surgeon: Napoleon Form, MD;  Location: [facility];  Service: Gastroenterology;  Laterality: N/A;   ESOPHAGEAL BANDING  10/07/2020   Procedure: ESOPHAGEAL BANDING;  Surgeon: Napoleon Form, MD;  Location: [facility];  Service: Endoscopy;;   ESOPHAGOGASTRODUODENOSCOPY N/A 03/29/2023   Procedure: ESOPHAGOGASTRODUODENOSCOPY (EGD);  Surgeon: Lynann Bologna, DO;  Location: [facility];  Service: Gastroenterology;  Laterality: N/A;   ESOPHAGOGASTRODUODENOSCOPY (EGD) WITH PROPOFOL N/A 10/07/2020   Procedure: ESOPHAGOGASTRODUODENOSCOPY (EGD) WITH PROPOFOL;  Surgeon: Napoleon Form, MD;  Location: [facility];  Service: Endoscopy;  Laterality: N/A;   ESOPHAGOGASTRODUODENOSCOPY (EGD) WITH PROPOFOL N/A 01/18/2021   Procedure: ESOPHAGOGASTRODUODENOSCOPY (EGD) WITH PROPOFOL;  Surgeon: Napoleon Form, MD;  Location: [facility];  Service: Endoscopy;  Laterality: N/A;   ESOPHAGOGASTRODUODENOSCOPY (EGD) WITH PROPOFOL N/A 05/29/2022   Procedure: ESOPHAGOGASTRODUODENOSCOPY (EGD) WITH PROPOFOL;  Surgeon: Napoleon Form, MD;  Location: [facility];  Service: Gastroenterology;  Laterality: N/A;   FASCIECTOMY Left 02/10/2021   Procedure: FASCIECTOMY RING FINGER AND SMALL FINGER OF LEFT HAND;  Surgeon: Cindee Salt, MD;  Location: [facility];  Service: Orthopedics;  Laterality: Left;   FASCIECTOMY Right 07/07/2021   Procedure: FASCIECTOMY RIGHT 1ST WEB, RIGHT RING FINGER, RIGHT SMALL FINGER;  Surgeon: Cindee Salt, MD;  Location: [facility];  Service: Orthopedics;  Laterality: Right;   SPINE SURGERY       Social History:   reports that he quit smoking about 26 years ago. His smoking use included cigarettes. He  has never used smokeless tobacco. He reports current alcohol use of about 9.0 standard drinks of alcohol per week. He reports that he does not use drugs.   Family History:  His family history includes Alcohol abuse in his mother; Arthritis in his paternal grandmother; Atrial fibrillation in his father; COPD in his father; Cancer in his paternal grandfather; Early death in his mother; Heart disease in his father; Hyperlipidemia in his father; Hypertension in his father; Lung cancer (age of onset: 69) in his mother. There is no history of Colon cancer, Esophageal cancer, Stomach cancer, or Rectal cancer.   Allergies Allergies  Allergen Reactions   Cephalexin Rash   Doxycycline Rash   Latex Hives and Rash     Home Medications  Prior to Admission medications   Medication Sig Start Date End Date Taking? Authorizing Provider  ciprofloxacin (CIPRO) 500 MG tablet Take 1 tablet (500 mg  total) by mouth daily with breakfast. 03/31/23 04/30/23  Willeen Niece, MD  furosemide (LASIX) 40 MG tablet Take 1 tablet (40 mg total) by mouth 2 (two) times daily at 10 am and 4 pm. 02/20/23 04/25/23  Clayborne Dana, NP  lactulose (CHRONULAC) 10 GM/15ML solution Take 30 mLs (20 g total) by mouth 3 (three) times daily. 03/30/23   Willeen Niece, MD  metFORMIN (GLUCOPHAGE) 500 MG tablet Take 500 mg by mouth daily.    [provider]  midodrine (PROAMATINE) 10 MG tablet Take 1 tablet (10 mg total) by mouth 3 (three) times daily after meals. 03/30/23 04/29/23  Willeen Niece, MD  pantoprazole (PROTONIX) 40 MG tablet Take 1 tablet (40 mg total) by mouth 2 (two) times daily before a meal. 02/20/23   Clayborne Dana, NP  spironolactone (ALDACTONE) 25 MG tablet Take 1 tablet (25 mg total) by mouth daily. 02/20/23 04/25/23  Clayborne Dana, NP  sucralfate (CARAFATE) 1 GM/10ML suspension Take 10 mLs (1 g total) by mouth 2 (two) times daily. 03/30/23   Willeen Niece, MD     Critical care time: 46 minutes     Joneen Roach, AGACNP-BC [facility]  See Amion for personal pager PCCM on call pager [phone] until 7pm. Please call Elink 7p-7a. [phone]  04/26/2023 10:28 AM

## 2023-04-26 NOTE — Progress Notes (Signed)
Initial Nutrition Assessment  DOCUMENTATION CODES:   Severe malnutrition in context of chronic illness  INTERVENTION:  - No plans for tube feeds today.   - If able to start tube feeds, would recommend: Vital 1.5 at 55 ml/h (1320 ml per day) *Would recommend starting at 71mL/hr and advancing by 10mL Q12H  Prosource TF20 60 ml BID Provides 2140 kcal, 129 gm protein, 1008 ml free water daily  - Monitor magnesium, potassium, and phosphorus BID for at least 3 days, MD to replete as needed, as pt is at risk for refeeding syndrome given severe malnutrition.  - FWF per CCM/MD.   - Monitor weight trends.    NUTRITION DIAGNOSIS:   Severe Malnutrition related to chronic illness (alcohol-related hepatic cirrhosis) as evidenced by severe fat depletion, severe muscle depletion.  GOAL:   Patient will meet greater than or equal to 90% of their needs  MONITOR:   Vent status, Labs, Weight trends  REASON FOR ASSESSMENT:   Consult Assessment of nutrition requirement/status  ASSESSMENT:   62 year old male with PMH significant for alcohol-related hepatic cirrhosis, ascites refractory to multiple paracentesis and diuretics, esophageal varices, and hepatic encephalopathy. Had a recent admission in late August for ascites who presented after he was noted to exhibit unusual behavior at the grocery store. Admitted for hepatic encephalopathy.   9/24: Admit  Patient transferred to ICU this AM due to lethargy, vomiting, and aspiration concern. Patient subsequently intubated. OGT placed, xray verified in the stomach.   Patient intubated at time of visit, no family/visitor at bedside.   Patient known to this RD, last assessed on 8/27 during previous admission. At that time patient reported a UBW of 205# and adamantly denied weight loss although weight trends indicated possible weight loss. He was consuming 25-75% of meals during that admission.   No plans to start tube feeds today. Per RN, patient  continues to show signs of having to vomit.  Palliative care now consulted.   Medications reviewed and include: Colace, Miralax, Lasix, Insulin, Carafate BID  Labs reviewed:  Na 132   NUTRITION - FOCUSED PHYSICAL EXAM:  Flowsheet Row Most Recent Value  Orbital Region Moderate depletion  Upper Arm Region Severe depletion  Thoracic and Lumbar Region Severe depletion  Buccal Region Unable to assess  Temple Region Severe depletion  Clavicle Bone Region Severe depletion  Clavicle and Acromion Bone Region Severe depletion  Scapular Bone Region Unable to assess  Dorsal Hand Mild depletion  Patellar Region Moderate depletion  Anterior Thigh Region Moderate depletion  Posterior Calf Region Moderate depletion  Edema (RD Assessment) None  Hair Reviewed  Eyes Reviewed  Mouth Unable to assess  Skin Reviewed  Nails Reviewed       Diet Order:   Diet Order             Diet NPO time specified  Diet effective now                   EDUCATION NEEDS:  Not appropriate for education at this time  Skin:  Skin Assessment: Reviewed RN Assessment  Last BM:  9/24  Height:  Ht Readings from Last 1 Encounters:  04/26/23 5\' 8"  (1.727 m)   Weight:  Wt Readings from Last 1 Encounters:  04/26/23 78.6 kg    BMI:  Body mass index is 26.35 kg/m.  Estimated Nutritional Needs:  Kcal:  1950-2350 kcals Protein:  115-135 grams Fluid:  >/= 1.9L    Shelle Iron RD, LDN For contact information,  refer to [facility].

## 2023-04-26 NOTE — Progress Notes (Signed)
eLink Physician-Brief Progress Note Patient Name: Nathan Roberts DOB: [birth year] MRN: [id number]   Date of Service  04/26/2023  HPI/Events of Note  Sub-optimal sedation on the ventilator. Patient needs a KUB to verify OG tube position.  eICU Interventions  Precedex gtt ordered, KUB ordered.        Migdalia Dk 04/26/2023, 9:26 PM

## 2023-04-26 NOTE — TOC Progression Note (Addendum)
Transition of Care [facility]) - Progression Note    Patient Details  Name: MARVEL FINNEGAN MRN: [id number] Date of Birth: July 29, 1961  Transition of [facility]) CM/SW Contact  Howell Rucks, RN Phone Number: 04/26/2023, 9:26 AM  Clinical Narrative: pt now in ICU,  Consult to Palliative Medicine Team. [facility] will continue to ofllow.   -11:44am Call received from pt's brother Harvie Heck), introduce self and role of TOC/NCM. Per Harvie Heck, family would like patient to travel to [place] at discharge to be closer to the family, reports they have a condo for him and if he as to go to a LTC facility they would like him close to the family. NCM informed Harvie Heck that pt has now been transferred to ICU and is on a mechanical vent, directed Harvie Heck to call ICU to speak with attending and/or nurse for further information, voiced understanding. TOC will continue to follow.        Expected Discharge Plan and Services                                               Social Determinants of Health (SDOH) Interventions SDOH Screenings   Food Insecurity: Food Insecurity Present (03/21/2023)  Housing: Low Risk  (03/21/2023)  Transportation Needs: Unmet Transportation Needs (03/21/2023)  Utilities: Not At Risk (03/21/2023)  Depression (PHQ2-9): Medium Risk (12/22/2022)  Social Connections: Unknown (12/09/2021)   Received from [facility], [facility]  Tobacco Use: Medium Risk (04/24/2023)    Readmission Risk Interventions     No data to display

## 2023-04-26 NOTE — Progress Notes (Signed)
   Called emergently to bedside by RN. Pt is obtunded. Code stroke called last night. Pt with bilateral flaccid UE/LE. CT head negative. Telestroke consulted.  NH3 elevated to 213 today. Was 147 on admission. Has been on lactulose and xifaxin.  Pupils reactive bilaterally.  Palliative care suppose to see pt today and call family. Pt's wife(separated) lives in [place].  Pt still a full code at this point.  Will transfer to SDU.  Plan on bedside paracentesis to r/o SBP.  Check CBC, procal and INR  Make NPO. Will get GI involved.  Previously seen by [facility]. He left [facility] and started seeing [facility].  [facility].  Carollee Herter, DO [facility]

## 2023-04-26 NOTE — Progress Notes (Addendum)
Patient Name: Nathan Roberts           DOB: Aug 23, 1960  MRN: [id number]      Admission Date: 04/22/2023  Attending Provider: Carollee Herter, DO  Primary Diagnosis: Hepatic encephalopathy [facility])   Level of care: Med-Surg    CROSS COVER NOTE   Date of Service   04/26/2023   [name], 62 y.o. male, was admitted on 04/03/2023 for Hepatic encephalopathy (HCC).    HPI/Events of Note   Bedside RN reporting patient is nonverbal, but awake.  Poorly responding to painful stimulation in all 4 extremities.  Unable to follow commands.  CBG 98.  LKW 2100.Given change in neurostatus, code stroke was activated at 0547.   During bedside evaluation, exam is limited.  Patient's eyes open.  PERRLA.  No gaze deviation.  Blink reflex intact.  Attempts to track voice with eyes. Nonverbal.  Facial symmetry intact.  Moves face away when an attempt was made to examine oral cavity. Bilateral arms flaccid, not responsive to painful stimulation. Some tone to bilateral lower extremities.  Patient is able to withdraw from pain on left lower extremity.  small movement noted to right lower extremity with pain stimuli.  VSS on room air.  Protecting airway at this time.   Results:  Head CT without contrast-no acute infarct or intracranial hemorrhage. Ammonia level-147 --> 213   Spoke with Dr. Buren Kos (neurology) regarding case and CT scan.  CT head showed no acute hemorrhage or acute core infarct.  Current neuro presentation could be secondary to hepatic encephalopathy and worsening ammonia.  Recommendation made to treat ammonia level now.  If no improvement is noted within 12 to 24 hours, recommendation was made to repeat head CT to reassess.    Interventions/ Plan   Lactulose enema Recommend repeat CT brain in 12-24 hours       Anthoney Harada, DNP, [facility]

## 2023-04-26 NOTE — Consult Note (Signed)
Consultation Note Date: 04/26/2023   Patient Name: Nathan Roberts  DOB: [birth year]  MRN: [id number]  Age / Sex: 62 y.o., male   PCP: Clayborne Dana, NP Referring Physician: Oretha Milch, MD  Reason for Consultation: Establishing goals of care     Chief Complaint/History of Present Illness:   Patient is a 62 year old male with a past medical history of alcohol related hepatic cirrhosis complicated by ascites refractory to multiple paracenteses and diuretics and esophageal varices and hepatic encephalopathy and arthritis who was admitted on 04/18/2023 for management of altered mental status with unusual behavior at a grocery store.  During admission he was noted to have elevated ammonia levels.  Patient initially admitted for management of hepatic encephalopathy though patient had worsening lethargy and ammonia overnight despite aggressive medical interventions and so required transfer to the ICU.  Patient vomited and ultimately required intubation for protection of his airway.  PCCM consulted and now managing care.  GI consulted for management of decompensated cirrhosis.  Palliative medicine team consulted to assist with complex medical decision making. Of note, patient seen by palliative medicine team on 03/24/2023 during that admission.  Extensive review of EMR prior to presenting to bedside.  When presenting to bedside, patient had recently been intubated this morning.  Discussed care with PCCM provider and RN for medical updates.  Noted patient vomited prior to intubation. Patient seen laying intubated and sedated in bed.  Patient appears chronically ill with multiple ecchymoses on upper extremities bilaterally. GI evaluated patient today and noted recommendation of continuing lactulose enemas and supportive care. Noted patient is not a TIPS candidate currently due to encephalopathy.  Patient noted to not previously be a transplant candidate due to social issues.  Reportedly patient quit  drinking alcohol 4 months ago. No family or friends present at bedside during visit today.  Provider(s) has been unable to reach patient's (ex-)wife despite multiple phone call attempts.  Primary Diagnoses  Present on Admission:  Hepatic encephalopathy (HCC)  Ascites due to alcoholic cirrhosis (HCC)  Hypertension  Alcoholic cirrhosis of liver with ascites (HCC)   Past Medical History:  Diagnosis Date   Allergy    Arthritis    Carpal tunnel syndrome    Diabetes mellitus without complication (HCC)    Esophageal varices (HCC)    GERD (gastroesophageal reflux disease)    GI bleed    Hyperlipidemia    Hypertension    Pancreas divisum 03/22/2023   Portal hypertensive gastropathy (HCC)    Pre-diabetes    Social History   Socioeconomic History   Marital status: Divorced    Spouse name: Not on file   Number of children: Not on file   Years of education: Not on file   Highest education level: Not on file  Occupational History   Not on file  Tobacco Use   Smoking status: Former    Current packs/day: 0.00    Types: Cigarettes    Quit date: 12/08/1996    Years since quitting: 26.3   Smokeless tobacco: Never  Vaping Use   Vaping status: Never Used  Substance and Sexual Activity   Alcohol use: Yes    Alcohol/week: 9.0 standard drinks of alcohol    Types: 2 Glasses of wine, 5 Cans of beer, 1 Shots of liquor, 1 Standard drinks or equivalent per week   Drug use: No   Sexual activity: Not Currently  Other Topics Concern   Not on file  Social History Narrative   Not on file  Social Determinants of Health   Financial Resource Strain: Not on file  Food Insecurity: Food Insecurity Present (03/21/2023)   Hunger Vital Sign    Worried About Running Out of Food in the Last Year: Sometimes true    Ran Out of Food in the Last Year: Sometimes true  Transportation Needs: Unmet Transportation Needs (03/21/2023)   PRAPARE - Administrator, Civil Service (Medical): Yes     Lack of Transportation (Non-Medical): Yes  Physical Activity: Not on file  Stress: Not on file  Social Connections: Unknown (12/09/2021)   Received from [employer], [facility]   Social Network    Social Network: Not on file   Family History  Problem Relation Age of Onset   Early death Mother    Lung cancer Mother 25       lung--smoker   Alcohol abuse Mother    Atrial fibrillation Father    Heart disease Father    COPD Father    Hyperlipidemia Father    Hypertension Father    Arthritis Paternal Grandmother    Cancer Paternal Grandfather    Colon cancer Neg Hx    Esophageal cancer Neg Hx    Stomach cancer Neg Hx    Rectal cancer Neg Hx    Scheduled Meds:  Chlorhexidine Gluconate Cloth  6 each Topical Daily   ciprofloxacin  500 mg Oral Q breakfast   furosemide  20 mg Oral BID   insulin aspart  0-15 Units Subcutaneous TID WC   insulin aspart  0-5 Units Subcutaneous QHS   lactulose  20 g Oral TID   midodrine  10 mg Oral TID PC   pantoprazole  40 mg Oral BID AC   rifaximin  550 mg Oral BID   spironolactone  25 mg Oral Daily   sucralfate  1 g Oral BID   Continuous Infusions:  sodium chloride 100 mL/hr at 04/26/23 0652   PRN Meds:.albuterol, ibuprofen, ondansetron **OR** ondansetron (ZOFRAN) IV Allergies  Allergen Reactions   Cephalexin Rash   Doxycycline Rash   Latex Hives and Rash   CBC:    Component Value Date/Time   WBC 4.7 04/25/2023 0542   HGB 10.1 (L) 04/25/2023 0542   HGB 12.2 (L) 05/27/2018 1649   HCT 30.8 (L) 04/25/2023 0542   HCT 36.2 (L) 05/27/2018 1649   PLT 91 (L) 04/25/2023 0542   PLT 116 (L) 05/27/2018 1649   MCV 97.8 04/25/2023 0542   MCV 89 05/27/2018 1649   NEUTROABS 5.3 03/28/2023 0413   LYMPHSABS 0.9 03/28/2023 0413   MONOABS 1.4 (H) 03/28/2023 0413   EOSABS 0.6 (H) 03/28/2023 0413   BASOSABS 0.1 03/28/2023 0413   Comprehensive Metabolic Panel:    Component Value Date/Time   NA 132 (L) 04/26/2023 0519   NA 133 (L) 05/27/2018  1649   K 4.6 04/26/2023 0519   CL 101 04/26/2023 0519   CO2 21 (L) 04/26/2023 0519   BUN 23 04/26/2023 0519   BUN 13 05/27/2018 1649   CREATININE 0.85 04/26/2023 0519   CREATININE 0.83 01/07/2014 1308   GLUCOSE 98 04/26/2023 0519   CALCIUM 9.0 04/26/2023 0519   AST 51 (H) 04/26/2023 0519   ALT 23 04/26/2023 0519   ALKPHOS 82 04/26/2023 0519   BILITOT 4.7 (H) 04/26/2023 0519   BILITOT 0.7 05/27/2018 1649   PROT 6.7 04/26/2023 0519   PROT 7.6 05/27/2018 1649   ALBUMIN 3.0 (L) 04/26/2023 0519   ALBUMIN 4.2 05/27/2018 1649  Physical Exam: Vital Signs: BP 136/82 (BP Location: Left Arm)   Pulse 70   Temp (!) 97.5 F (36.4 C) (Axillary)   Resp 18   Ht 5\' 8"  (1.727 m)   Wt 79.2 kg   SpO2 100%   BMI 26.55 kg/m  SpO2: SpO2: 100 % O2 Device: O2 Device: Room Air O2 Flow Rate:   Intake/output summary:  Intake/Output Summary (Last 24 hours) at 04/26/2023 0914 Last data filed at 04/26/2023 0500 Gross per 24 hour  Intake --  Output 605 ml  Net -605 ml   LBM: Last BM Date : 05/07/23 Baseline Weight: Weight: 79.2 kg Most recent weight: Weight: 79.2 kg  General: intubated, sedated, chronically ill appearing  Eyes: scleral jaundice  HENT: ET tube in place Cardiovascular: RRR Respiratory: intubated on ventilator support  Abdomen: distended Skin: multiple ecchymosis on UEs b/l Neuro: sedated          Palliative Performance Scale: 10%              Additional Data Reviewed: Recent Labs    2023/05/07 1600 04/25/23 0542 04/26/23 0519  WBC 4.7 4.7  --   HGB 10.0* 10.1*  --   PLT 101* 91*  --   NA 131* 129* 132*  BUN 18 18 23   CREATININE 0.65 0.62 0.85    Imaging: CT HEAD CODE STROKE WO CONTRAST Addendum: ADDENDUM REPORT: 04/26/2023 06:27   ADDENDUM:  Study discussed by telephone with NP ABIGAIL CHAVEZ on 04/26/2023 at  0621 hours.   Electronically Signed    By: Odessa Fleming M.D.    On: 04/26/2023 06:27 Narrative: CLINICAL DATA:  Code stroke.  62 year old  male.  EXAM: CT HEAD WITHOUT CONTRAST  TECHNIQUE: Contiguous axial images were obtained from the base of the skull through the vertex without intravenous contrast.  RADIATION DOSE REDUCTION: This exam was performed according to the departmental dose-optimization program which includes automated exposure control, adjustment of the mA and/or kV according to patient size and/or use of iterative reconstruction technique.  COMPARISON:  Brain MRI 12/27/2011.  Head CT 03/21/2023.  FINDINGS: Brain: Cerebral volume is normal for age. No midline shift, ventriculomegaly, mass effect, evidence of mass lesion, intracranial hemorrhage or evidence of cortically based acute infarction.  Mild for age cerebral white matter hypodensity, most pronounced in the periatrial white matter, is stable. Otherwise normal gray-white differentiation.  Vascular: No suspicious intracranial vascular hyperdensity. Calcified atherosclerosis at the skull base.  Skull: No acute osseous abnormality identified.  Sinuses/Orbits: Sinuses are stable and well aerated. Small left maxillary and right sphenoid sinus retention cysts.  Other: No gaze deviation. Visualized orbits and scalp soft tissues are within normal limits.  ASPECTS [facility] Stroke Program Early CT Score)  Total score (0-10 with 10 being normal): 10  IMPRESSION: No acute cortically based infarct or acute intracranial hemorrhage identified. ASPECTS 10.  Electronically Signed: By: Odessa Fleming M.D. On: 04/26/2023 06:16    I personally reviewed recent imaging.   Palliative Care Assessment and Plan Summary of Established Goals of Care and Medical Treatment Preferences   Patient is a 62 year old male with a past medical history of alcohol related hepatic cirrhosis complicated by ascites refractory to multiple paracenteses and diuretics and esophageal varices and hepatic encephalopathy and arthritis who was admitted on 07-May-2023 for management of  altered mental status with unusual behavior at a grocery store.  During admission he was noted to have elevated ammonia levels.  Patient initially admitted for management of hepatic encephalopathy though patient  had worsening lethargy and ammonia overnight despite aggressive medical interventions and so required transfer to the ICU.  Patient vomited and ultimately required intubation for protection of his airway.  PCCM consulted and now managing care.  GI consulted for management of decompensated cirrhosis.  Palliative medicine team consulted to assist with complex medical decision making. Of note, patient seen by palliative medicine team on 03/24/2023 during that admission.  # Complex medical decision making/goals of care  -Patient unable to participate in complex medical decision due to current medical status.   -Currently attempting to determine patient's appropraite NOK. Patient has spouse listed in contact information though EMR review also notes this is patient's ex wife.  Patient also has brother and sister listed in EMR as contacts. Have been unable to reach patient's spouse/ex-wife as of now.   -  Code Status: Full Code   # Symptom management  -As per PCCM team  # Psycho-social/Spiritual Support:  - Support System: Currently attempting to determine patient's appropraite NOK. Patient has spouse listed in contact information though EMR review also notes this is patient's ex wife.  Patient also has brother and sister listed in EMR as contacts.  # Discharge Planning:  To Be Determined  Thank you for allowing the palliative care team to participate in the care Dineen Kid.  Alvester Morin, DO Palliative Care Provider PMT # [phone]  If patient remains symptomatic despite maximum doses, please call PMT at [phone] between 0700 and 1900. Outside of these hours, please call attending, as PMT does not have night coverage.  This provider spent a total of 60 minutes providing patient's  care.  Includes review of EMR, discussing care with other staff members involved in patient's medical care, obtaining relevant history and information from patient and/or patient's family, and personal review of imaging and lab work. Greater than 50% of the time was spent counseling and coordinating care related to the above assessment and plan.    *Please note that this is a verbal dictation therefore any spelling or grammatical errors are due to the "Dragon Medical One" system interpretation.

## 2023-04-26 NOTE — Progress Notes (Signed)
Per CCM order, advanced ETT 2cm from 24 to 26.

## 2023-04-26 NOTE — Significant Event (Signed)
Rapid Response Event Note   Reason for Call :  Change in LOC  Initial Focused Assessment:  Last known well with med pass by RN at 2202. NT staff alerted RN of change in condition with their morning rounds reportedly at [phone].  Patient awake with blank stare, no significant response to voice, occasionally withdraws to pain stimuli, not following commands, not verbal, flaccid arms, moving head side/side with occasional change in gaze/stare. Pupils 3, round, equal, and reactive. Patient is protecting airway and vital signs: 97.5 f axillary temp, BP 136/82, pulse 70, respirations 18, 100% saturation on room air. CBG 98.  Interventions:  CODE STROKE HEAD CT non-contrast. Labs done just prior to arrival.  Plan of Care:  Neurologist on tele cart, Dr. Buren Kos, determined that contrast/perfusion study not warranted at this time, but recommended repeat CT and medical management of elevated ammonia level. Patient will remain in current level of care at this time per provider. (Please also refer to physician and provider notes.)  Event Summary:   MD Notified: A. Virgel Manifold, NP at bedside with RR RN. Call Time: 0530 Arrival Time: 0535 End Time: 4782  Lamona Curl, RN

## 2023-04-26 NOTE — Progress Notes (Signed)
Responded to CODE STROKE. Per unit staff at bedside pt has patent PIV access.

## 2023-04-26 NOTE — Progress Notes (Signed)
   Pt now intubated and transferred to [facility] service.  TRH will sign off.  Carollee Herter, DO [facility]

## 2023-04-26 NOTE — Consult Note (Signed)
[facility] Tele[facility]urology Consult Services   Patient Name:   Nathan Roberts, Nathan Roberts Date of Birth:   [birth year] Date of Service:   04/26/2023 06:01:48  Diagnosis:       G93.41 - Encephalopathy Metabolic  Impression:      62 year old male with worsening mental status changes. Presentation likely secondary to worsening hepatic encephalopathy and increased ammonia.  Our recommendations are outlined below.  Recommendations:        Neuro Checks       DVT Prophylaxis       IV Fluids, Normal Saline       Euglycemia and Avoid Hyperthermia (PRN Acetaminophen)       metabolic encephalopathy management per primary team       Recommend repeat CT brain in 12-24 hours  Sign Out:       Discussed with Rapid Response Team    ------------------------------------------------------------------------------  Advanced Imaging: Advanced Imaging Deferred because:  Stroke not suspected with clinical presentation and exam   Metrics: Last Known Well: 04/25/2023 21:00:00 [facility] Notification Time: 04/26/2023 06:01:48 Stamp Time: 04/26/2023 06:01:48 Initial Response Time: 04/26/2023 06:09:20 Symptoms: altered mental status. Initial patient interaction: 04/26/2023 06:15:00 NIHSS Assessment Completed: 04/26/2023 06:20:00 Patient is not a candidate for Thrombolytic. Thrombolytic Medical Decision: 04/26/2023 06:10:00 Patient was not deemed candidate for Thrombolytic because of following reasons: Last Well Known Above 4.5 Hours.  CT head showed no acute hemorrhage or acute core infarct.  Primary Provider Notified of Diagnostic Impression and Management Plan on: 04/26/2023 06:21:00 Spoke With: Anthoney Harada NP Able to Reach 04/26/2023 06:21:00    ------------------------------------------------------------------------------  History of Present Illness: Patient is a 62 year old Male.  Inpatient stroke alert was called for symptoms of altered mental status. 62 year  old male with a history of GI bleed, and alcohol liver cirrhosis who is admitted for altered mental status secondary to hepatic encephalopathy. At 21:00 patient was confused but able to take his medications. This morning he was noted to be unresponsive and flaccid in his arms. On exam patient had his eyes open and fixed midline. He was flaccid in his arms but had tone in both legs. He was unable to follow commands but would spontaneously look around. There was no gaze deviation noted. Patient's ammonia increased from 147 to 213 this morning.   Past Medical History:      Hypertension      Diabetes Mellitus  Medications:  No Anticoagulant use  No Antiplatelet use Reviewed EMR for current medications  Allergies:  Reviewed  Social History: Alcohol Use: Yes Drug Use: No  Family History:  There is no family history of premature cerebrovascular disease pertinent to this consultation  ROS : 14 Points Review of Systems was performed and was negative except mentioned in HPI.  Past Surgical History: There Is No Surgical History Contributory To Today's Visit    Examination: BP(139/82), Pulse(70), 1A: Level of Consciousness - Movements to Pain + 2 1B: Ask Month and Age - Could Not Answer Either Question Correctly + 2 1C: Blink Eyes & Squeeze Hands - Performs 0 Tasks + 2 2: Test Horizontal Extraocular Movements - Normal + 0 3: Test Visual Fields - No Visual Loss + 0 4: Test Facial Palsy (Use Grimace if Obtunded) - Normal symmetry + 0 5A: Test Left Arm Motor Drift - No Effort Against Gravity + 3 5B: Test Right Arm Motor Drift - No Effort Against Gravity + 3 6A: Test Left Leg Motor Drift - Some Effort Against Gravity +  2 6B: Test Right Leg Motor Drift - Some Effort Against Gravity + 2 7: Test Limb Ataxia (FNF/Heel-Shin) - No Ataxia + 0 8: Test Sensation - Complete Loss: Cannot Sense Being Touched At All + 2 9: Test Language/Aphasia - Coma/Unresponsive + 3 10: Test Dysarthria -  Mute/Anarthric + 2 11: Test Extinction/Inattention - No abnormality + 0  NIHSS Score: 23   Pre-Morbid Modified Rankin Scale: 3 Points = Moderate disability; requiring some help, but able to walk without assistance  Spoke with : Anthoney Harada NP  This consult was conducted in real time using interactive audio and Immunologist. Patient was informed of the technology being used for this visit and agreed to proceed. Patient located in hospital and provider located at home/office setting.   Patient is being evaluated for possible acute neurologic impairment and high probability of imminent or life-threatening deterioration. I spent total of 30 minutes providing care to this patient, including time for face to face visit via telemedicine, review of medical records, imaging studies and discussion of findings with providers, the patient and/or family.   Dr Joice Lofts   [facility] For Inpatient follow-up with [facility] physician please call [facility] [phone]. This is not an outpatient service. Post hospital discharge, please contact hospital directly.  Please do not communicate with [facility] physicians via secure chat. If you have any questions, Please contact [facility]. Please call or reconsult our service if there are any clinical or diagnostic changes.

## 2023-04-26 NOTE — Procedures (Signed)
Intubation Procedure Note  BROADUS POUND  [id number]  30-Jun-1961  Date:04/26/23  Time:9:55 AM   Provider Performing:Phillip Maffei V. Altan Kraai    Procedure: Intubation (31500)  Indication(s) Respiratory Failure  Consent Unable to obtain consent due to emergent nature of procedure.   Anesthesia Etomidate, Versed, Fentanyl, and Rocuronium   Time Out Verified patient identification, verified procedure, site/side was marked, verified correct patient position, special equipment/implants available, medications/allergies/relevant history reviewed, required imaging and test results available.   Sterile Technique Usual hand hygeine, masks, and gloves were used   Procedure Description Patient positioned in bed supine.  Sedation given as noted above.  Patient was intubated with endotracheal tube using Glidescope.  View was Grade 1 full glottis .  Number of attempts was 1.  Colorimetric CO2 detector was consistent with tracheal placement.   Complications/Tolerance None; patient tolerated the procedure well. Chest X-ray is ordered to verify placement.   EBL Minimal   Specimen(s) None  Anhar Mcdermott V. Vassie Loll MD

## 2023-04-26 NOTE — Consult Note (Signed)
Referring Provider: Dr. Vassie Loll Primary Care Physician:  Clayborne Dana, NP Primary Gastroenterologist:  [facility]  Reason for Consultation:  Hepatic Encephalopathy  HPI: Nathan Roberts is a 62 y.o. male with alcoholic cirrhosis recently admitted with ascites and anemia and seen by my partners in the hospital during that admission in late August being seen for a consult today due to hepatic encephalopathy. History obtained by chart review. Patient intubated this morning for airway protection. Nurse reports yellow vomiting prior to intubation and small amount of red vomitus after intubation. No report or melena, hematochezia, or hematemesis on admit H/P or ER note. Large volume paracentesis at [facility] on 9/20 with 13 L removed. Patient confused on admit that worsened yesterday. Reportedly on Lactulose but noncompliant. EGD 03/29/23 (Dr. Lorenso Quarry) showed Grade I esophageal varices and moderate portal gastropathy.  Past Medical History:  Diagnosis Date   Allergy    Arthritis    Carpal tunnel syndrome    Diabetes mellitus without complication (HCC)    Esophageal varices (HCC)    GERD (gastroesophageal reflux disease)    GI bleed    Hyperlipidemia    Hypertension    Pancreas divisum 03/22/2023   Portal hypertensive gastropathy (HCC)    Pre-diabetes     Past Surgical History:  Procedure Laterality Date   abscess     rectal area   CARPAL TUNNEL RELEASE     left   CARPAL TUNNEL RELEASE Right 07/07/2021   Procedure: RIGHT CARPAL TUNNEL RELEASE;  Surgeon: Cindee Salt, MD;  Location: [facility];  Service: Orthopedics;  Laterality: Right;   CERVICAL FUSION     COLONOSCOPY WITH PROPOFOL N/A 05/29/2022   Procedure: COLONOSCOPY WITH PROPOFOL;  Surgeon: Napoleon Form, MD;  Location: [facility];  Service: Gastroenterology;  Laterality: N/A;   ESOPHAGEAL BANDING  10/07/2020   Procedure: ESOPHAGEAL BANDING;  Surgeon: Napoleon Form, MD;  Location: [facility];  Service:  Endoscopy;;   ESOPHAGOGASTRODUODENOSCOPY N/A 03/29/2023   Procedure: ESOPHAGOGASTRODUODENOSCOPY (EGD);  Surgeon: Lynann Bologna, DO;  Location: [facility];  Service: Gastroenterology;  Laterality: N/A;   ESOPHAGOGASTRODUODENOSCOPY (EGD) WITH PROPOFOL N/A 10/07/2020   Procedure: ESOPHAGOGASTRODUODENOSCOPY (EGD) WITH PROPOFOL;  Surgeon: Napoleon Form, MD;  Location: [facility];  Service: Endoscopy;  Laterality: N/A;   ESOPHAGOGASTRODUODENOSCOPY (EGD) WITH PROPOFOL N/A 01/18/2021   Procedure: ESOPHAGOGASTRODUODENOSCOPY (EGD) WITH PROPOFOL;  Surgeon: Napoleon Form, MD;  Location: [facility];  Service: Endoscopy;  Laterality: N/A;   ESOPHAGOGASTRODUODENOSCOPY (EGD) WITH PROPOFOL N/A 05/29/2022   Procedure: ESOPHAGOGASTRODUODENOSCOPY (EGD) WITH PROPOFOL;  Surgeon: Napoleon Form, MD;  Location: [facility];  Service: Gastroenterology;  Laterality: N/A;   FASCIECTOMY Left 02/10/2021   Procedure: FASCIECTOMY RING FINGER AND SMALL FINGER OF LEFT HAND;  Surgeon: Cindee Salt, MD;  Location: [facility];  Service: Orthopedics;  Laterality: Left;   FASCIECTOMY Right 07/07/2021   Procedure: FASCIECTOMY RIGHT 1ST WEB, RIGHT RING FINGER, RIGHT SMALL FINGER;  Surgeon: Cindee Salt, MD;  Location: [facility];  Service: Orthopedics;  Laterality: Right;   SPINE SURGERY      Prior to Admission medications   Medication Sig Start Date End Date Taking? Authorizing Provider  ciprofloxacin (CIPRO) 500 MG tablet Take 1 tablet (500 mg total) by mouth daily with breakfast. 03/31/23 04/30/23  Willeen Niece, MD  furosemide (LASIX) 40 MG tablet Take 1 tablet (40 mg total) by mouth 2 (two) times daily at 10 am and 4 pm. 02/20/23 04/25/23  Clayborne Dana, NP  lactulose (CHRONULAC) 10 GM/15ML solution Take 30 mLs (20 g total) by mouth 3 (three) times daily. 03/30/23   Willeen Niece, MD  metFORMIN (GLUCOPHAGE) 500 MG tablet Take 500 mg by mouth daily.    [provider]   midodrine (PROAMATINE) 10 MG tablet Take 1 tablet (10 mg total) by mouth 3 (three) times daily after meals. 03/30/23 04/29/23  Willeen Niece, MD  pantoprazole (PROTONIX) 40 MG tablet Take 1 tablet (40 mg total) by mouth 2 (two) times daily before a meal. 02/20/23   Clayborne Dana, NP  spironolactone (ALDACTONE) 25 MG tablet Take 1 tablet (25 mg total) by mouth daily. 02/20/23 04/25/23  Clayborne Dana, NP  sucralfate (CARAFATE) 1 GM/10ML suspension Take 10 mLs (1 g total) by mouth 2 (two) times daily. 03/30/23   Willeen Niece, MD    Scheduled Meds:  Chlorhexidine Gluconate Cloth  6 each Topical Daily   ciprofloxacin  500 mg Oral Q breakfast   docusate  100 mg Per Tube BID   furosemide  20 mg Per Tube BID   insulin aspart  0-15 Units Subcutaneous TID WC   insulin aspart  0-5 Units Subcutaneous QHS   lactulose  30 g Per Tube TID   mouth rinse  15 mL Mouth Rinse Q2H   pantoprazole (PROTONIX) IV  40 mg Intravenous Daily   polyethylene glycol  17 g Per Tube Daily   rifaximin  550 mg Per Tube BID   [START ON 04/27/2023] spironolactone  25 mg Per Tube Daily   sucralfate  1 g Per Tube BID   Continuous Infusions:  sodium chloride 100 mL/hr at 04/26/23 0652   PRN Meds:.albuterol, fentaNYL (SUBLIMAZE) injection, fentaNYL (SUBLIMAZE) injection, ibuprofen, midazolam, ondansetron **OR** ondansetron (ZOFRAN) IV, mouth rinse  Allergies as of 04/24/2023 - Review Complete 04/24/2023  Allergen Reaction Noted   Cephalexin Rash 08/12/2017   Doxycycline Rash 10/06/2020   Latex Hives and Rash 11/01/2011    Family History  Problem Relation Age of Onset   Early death Mother    Lung cancer Mother 83       lung--smoker   Alcohol abuse Mother    Atrial fibrillation Father    Heart disease Father    COPD Father    Hyperlipidemia Father    Hypertension Father    Arthritis Paternal Grandmother    Cancer Paternal Grandfather    Colon cancer Neg Hx    Esophageal cancer Neg Hx    Stomach cancer Neg Hx     Rectal cancer Neg Hx     Social History   Socioeconomic History   Marital status: Divorced    Spouse name: Not on file   Number of children: Not on file   Years of education: Not on file   Highest education level: Not on file  Occupational History   Not on file  Tobacco Use   Smoking status: Former    Current packs/day: 0.00    Types: Cigarettes    Quit date: 12/08/1996    Years since quitting: 26.3   Smokeless tobacco: Never  Vaping Use   Vaping status: Never Used  Substance and Sexual Activity   Alcohol use: Yes    Alcohol/week: 9.0 standard drinks of alcohol    Types: 2 Glasses of wine, 5 Cans of beer, 1 Shots of liquor, 1 Standard drinks or equivalent per week   Drug use: No   Sexual activity: Not Currently  Other Topics Concern   Not on file  Social  History Narrative   Not on file   Social Determinants of Health   Financial Resource Strain: Not on file  Food Insecurity: Food Insecurity Present (03/21/2023)   Hunger Vital Sign    Worried About Running Out of Food in the Last Year: Sometimes true    Ran Out of Food in the Last Year: Sometimes true  Transportation Needs: Unmet Transportation Needs (03/21/2023)   PRAPARE - Administrator, Civil Service (Medical): Yes    Lack of Transportation (Non-Medical): Yes  Physical Activity: Not on file  Stress: Not on file  Social Connections: Unknown (12/09/2021)   Received from [facility]   Social Network    Social Network: Not on file  Intimate Partner Violence: Not At Risk (03/21/2023)   Humiliation, Afraid, Rape, and Kick questionnaire    Fear of Current or Ex-Partner: No    Emotionally Abused: No    Physically Abused: No    Sexually Abused: No    Review of Systems: All negative except as stated above in HPI.  Physical Exam: Vital signs: Vitals:   04/26/23 1130 04/26/23 1145  BP: 136/79 136/84  Pulse: 72 76  Resp: 20 20  Temp:    SpO2: 100% 100%  T 96.2  Last BM Date :  04/24/23 General:  Intubated; sedated; thin; chronically ill-appearing   Head: normocephalic, atraumatic Eyes: +icteric sclera ENT: oropharynx clear Neck: supple, nontender Lungs:  Clear throughout to auscultation.   No wheezes, crackles, or rhonchi. No acute distress. Heart:  Regular rate and rhythm; no murmurs, clicks, rubs,  or gallops. Abdomen: nontender (no facial grimace); +distention; +BS  Rectal:  Deferred Ext: no edema  GI:  Lab Results: Recent Labs    04/24/23 1600 04/25/23 0542 04/26/23 0913  WBC 4.7 4.7 8.4  HGB 10.0* 10.1* 11.5*  HCT 31.1* 30.8* 35.5*  PLT 101* 91* 109*   BMET Recent Labs    04/24/23 1600 04/25/23 0542 04/26/23 0519  NA 131* 129* 132*  K 4.1 4.0 4.6  CL 99 100 101  CO2 24 22 21*  GLUCOSE 126* 97 98  BUN 18 18 23   CREATININE 0.65 0.62 0.85  CALCIUM 8.5* 8.5* 9.0   LFT Recent Labs    04/26/23 0519  PROT 6.7  ALBUMIN 3.0*  AST 51*  ALT 23  ALKPHOS 82  BILITOT 4.7*   PT/INR Recent Labs    04/24/23 1600 04/26/23 0913  LABPROT 19.8* 19.6*  INR 1.7* 1.6*     Studies/Results: CT HEAD CODE STROKE WO CONTRAST  Addendum Date: 04/26/2023   ADDENDUM REPORT: 04/26/2023 06:27 ADDENDUM: Study discussed by telephone with NP ABIGAIL CHAVEZ on 04/26/2023 at 0621 hours. Electronically Signed   By: Odessa Fleming M.D.   On: 04/26/2023 06:27   Result Date: 04/26/2023 CLINICAL DATA:  Code stroke.  62 year old male. EXAM: CT HEAD WITHOUT CONTRAST TECHNIQUE: Contiguous axial images were obtained from the base of the skull through the vertex without intravenous contrast. RADIATION DOSE REDUCTION: This exam was performed according to the departmental dose-optimization program which includes automated exposure control, adjustment of the mA and/or kV according to patient size and/or use of iterative reconstruction technique. COMPARISON:  Brain MRI 12/27/2011.  Head CT 03/21/2023. FINDINGS: Brain: Cerebral volume is normal for age. No midline shift,  ventriculomegaly, mass effect, evidence of mass lesion, intracranial hemorrhage or evidence of cortically based acute infarction. Mild for age cerebral white matter hypodensity, most pronounced in the periatrial white matter, is stable. Otherwise normal  gray-white differentiation. Vascular: No suspicious intracranial vascular hyperdensity. Calcified atherosclerosis at the skull base. Skull: No acute osseous abnormality identified. Sinuses/Orbits: Sinuses are stable and well aerated. Small left maxillary and right sphenoid sinus retention cysts. Other: No gaze deviation. Visualized orbits and scalp soft tissues are within normal limits. ASPECTS [facility] Stroke Program Early CT Score) Total score (0-10 with 10 being normal): 10 IMPRESSION: No acute cortically based infarct or acute intracranial hemorrhage identified. ASPECTS 10. Electronically Signed: By: Odessa Fleming M.D. On: 04/26/2023 06:16    Impression/Plan: Decompensated alcoholic cirrhosis with hepatic encephalopathy and ascites - continue Lactulose enemas and Lactulose and Rifaximin via OG-tube. Supportive care. Small of bleeding following intubation likely due to mucosal trauma in setting of cirrhosis. Hgb 11.5. Will follow.    LOS: 2 days   [name]  04/26/2023, 12:02 PM  Questions please call [phone]

## 2023-04-26 NOTE — Progress Notes (Signed)
95: Tele stroke cart activated at this time. Inpatient code stroke. LKW 2100. ROS: 0530.   0550: Inpatient provider at bedside to assess patient.   4696: Pt to CT at this time.   0601: TSP paged.   2952: TSP on cart at this time.   8413: Results of NCCT provided to TSP at this time.   0625:TSP and TSRN off cart at this time. TSP to provide plan of care to inpatient provider.

## 2023-04-26 NOTE — Progress Notes (Signed)
eLink Physician-Brief Progress Note Patient Name: Nathan Roberts DOB: [birth year] MRN: [id number]   Date of Service  04/26/2023  HPI/Events of Note  KUB reviewed, OG tube in good position.  eICU Interventions  Okay to use OG tube.        Thomasene Lot [name] 04/26/2023, 10:15 PM

## 2023-04-27 DIAGNOSIS — J9600 Acute respiratory failure, unspecified whether with hypoxia or hypercapnia: Secondary | ICD-10-CM | POA: Diagnosis present

## 2023-04-27 DIAGNOSIS — N179 Acute kidney failure, unspecified: Secondary | ICD-10-CM | POA: Diagnosis not present

## 2023-04-27 DIAGNOSIS — Z515 Encounter for palliative care: Secondary | ICD-10-CM | POA: Diagnosis not present

## 2023-04-27 DIAGNOSIS — K7031 Alcoholic cirrhosis of liver with ascites: Secondary | ICD-10-CM | POA: Diagnosis not present

## 2023-04-27 DIAGNOSIS — K7682 Hepatic encephalopathy: Secondary | ICD-10-CM | POA: Diagnosis not present

## 2023-04-27 DIAGNOSIS — Z7189 Other specified counseling: Secondary | ICD-10-CM

## 2023-04-27 DIAGNOSIS — Z9911 Dependence on respirator [ventilator] status: Secondary | ICD-10-CM | POA: Diagnosis not present

## 2023-04-27 DIAGNOSIS — R4589 Other symptoms and signs involving emotional state: Secondary | ICD-10-CM | POA: Diagnosis present

## 2023-04-27 LAB — CBC WITH DIFFERENTIAL/PLATELET
Abs Immature Granulocytes: 0.03 10*3/uL (ref 0.00–0.07)
Basophils Absolute: 0.1 10*3/uL (ref 0.0–0.1)
Basophils Relative: 1 %
Eosinophils Absolute: 0.2 10*3/uL (ref 0.0–0.5)
Eosinophils Relative: 3 %
HCT: 32.4 % — ABNORMAL LOW (ref 39.0–52.0)
Hemoglobin: 10.3 g/dL — ABNORMAL LOW (ref 13.0–17.0)
Immature Granulocytes: 0 %
Lymphocytes Relative: 16 %
Lymphs Abs: 1.1 10*3/uL (ref 0.7–4.0)
MCH: 32.1 pg (ref 26.0–34.0)
MCHC: 31.8 g/dL (ref 30.0–36.0)
MCV: 100.9 fL — ABNORMAL HIGH (ref 80.0–100.0)
Monocytes Absolute: 1.6 10*3/uL — ABNORMAL HIGH (ref 0.1–1.0)
Monocytes Relative: 23 %
Neutro Abs: 3.9 10*3/uL (ref 1.7–7.7)
Neutrophils Relative %: 57 %
Platelets: 107 10*3/uL — ABNORMAL LOW (ref 150–400)
RBC: 3.21 MIL/uL — ABNORMAL LOW (ref 4.22–5.81)
RDW: 15.8 % — ABNORMAL HIGH (ref 11.5–15.5)
WBC: 6.9 10*3/uL (ref 4.0–10.5)
nRBC: 0 % (ref 0.0–0.2)

## 2023-04-27 LAB — MAGNESIUM
Magnesium: 2.2 mg/dL (ref 1.7–2.4)
Magnesium: 2.4 mg/dL (ref 1.7–2.4)

## 2023-04-27 LAB — BLOOD GAS, ARTERIAL
Acid-base deficit: 2.1 mmol/L — ABNORMAL HIGH (ref 0.0–2.0)
Bicarbonate: 20.6 mmol/L (ref 20.0–28.0)
Drawn by: 23281
FIO2: 40 %
MECHVT: 550 mL
O2 Saturation: 100 %
PEEP: 5 cmH2O
Patient temperature: 36.6
RATE: 16 {breaths}/min
pCO2 arterial: 29 mm[Hg] — ABNORMAL LOW (ref 32–48)
pH, Arterial: 7.47 — ABNORMAL HIGH (ref 7.35–7.45)
pO2, Arterial: 161 mm[Hg] — ABNORMAL HIGH (ref 83–108)

## 2023-04-27 LAB — COMPREHENSIVE METABOLIC PANEL
ALT: 21 U/L (ref 0–44)
AST: 44 U/L — ABNORMAL HIGH (ref 15–41)
Albumin: 2.8 g/dL — ABNORMAL LOW (ref 3.5–5.0)
Alkaline Phosphatase: 79 U/L (ref 38–126)
Anion gap: 11 (ref 5–15)
BUN: 32 mg/dL — ABNORMAL HIGH (ref 8–23)
CO2: 19 mmol/L — ABNORMAL LOW (ref 22–32)
Calcium: 8.8 mg/dL — ABNORMAL LOW (ref 8.9–10.3)
Chloride: 103 mmol/L (ref 98–111)
Creatinine, Ser: 1.21 mg/dL (ref 0.61–1.24)
GFR, Estimated: >60 mL/min (ref >60)
Glucose, Bld: 128 mg/dL — ABNORMAL HIGH (ref 70–99)
Potassium: 4.8 mmol/L (ref 3.5–5.1)
Sodium: 133 mmol/L — ABNORMAL LOW (ref 135–145)
Total Bilirubin: 4.4 mg/dL — ABNORMAL HIGH (ref 0.3–1.2)
Total Protein: 6.3 g/dL — ABNORMAL LOW (ref 6.5–8.1)

## 2023-04-27 LAB — GLUCOSE, CAPILLARY
Glucose-Capillary: 134 mg/dL — ABNORMAL HIGH (ref 70–99)
Glucose-Capillary: 125 mg/dL — ABNORMAL HIGH (ref 70–99)
Glucose-Capillary: 144 mg/dL — ABNORMAL HIGH (ref 70–99)
Glucose-Capillary: 153 mg/dL — ABNORMAL HIGH (ref 70–99)
Glucose-Capillary: 156 mg/dL — ABNORMAL HIGH (ref 70–99)
Glucose-Capillary: 135 mg/dL — ABNORMAL HIGH (ref 70–99)

## 2023-04-27 LAB — PHOSPHORUS
Phosphorus: 6 mg/dL — ABNORMAL HIGH (ref 2.5–4.6)
Phosphorus: 4.7 mg/dL — ABNORMAL HIGH (ref 2.5–4.6)

## 2023-04-27 LAB — AMMONIA: Ammonia: 149 umol/L — ABNORMAL HIGH (ref 9–35)

## 2023-04-27 MED ORDER — PROPRANOLOL HCL 20 MG PO TABS
20.0000 mg | ORAL_TABLET | Freq: Three times a day (TID) | ORAL | Status: DC
  Administered 2023-04-27 – 2023-04-28 (×5): 20 mg
  Filled 2023-04-27 (×4): qty 1

## 2023-04-27 MED ORDER — VITAL 1.5 CAL PO LIQD
1000.0000 mL | ORAL | Status: DC
  Administered 2023-04-27 – 2023-05-03 (×7): 1000 mL
  Filled 2023-04-27 (×9): qty 1000

## 2023-04-27 MED ORDER — LACTULOSE 10 GM/15ML PO SOLN
30.0000 g | ORAL | Status: DC
  Administered 2023-04-27 – 2023-04-29 (×25): 30 g
  Filled 2023-04-27 (×19): qty 45

## 2023-04-27 MED ORDER — PROPRANOLOL HCL 20 MG/5ML PO SOLN
20.0000 mg | Freq: Three times a day (TID) | ORAL | Status: DC
  Filled 2023-04-27: qty 5

## 2023-04-27 MED ORDER — LABETALOL HCL 5 MG/ML IV SOLN
10.0000 mg | INTRAVENOUS | Status: DC | PRN
  Administered 2023-04-27: 10 mg via INTRAVENOUS
  Filled 2023-04-27: qty 4

## 2023-04-27 MED ORDER — PROSOURCE TF20 ENFIT COMPATIBL EN LIQD
60.0000 mL | Freq: Two times a day (BID) | ENTERAL | Status: DC
  Administered 2023-04-27 – 2023-05-03 (×13): 60 mL
  Filled 2023-04-27 (×13): qty 60

## 2023-04-27 MED ORDER — CIPROFLOXACIN HCL 500 MG PO TABS
500.0000 mg | ORAL_TABLET | Freq: Every day | ORAL | Status: DC
  Administered 2023-04-27 – 2023-05-02 (×6): 500 mg
  Filled 2023-04-27 (×7): qty 1

## 2023-04-27 MED ORDER — PHYTONADIONE 5 MG PO TABS
10.0000 mg | ORAL_TABLET | Freq: Once | ORAL | Status: AC
  Administered 2023-04-27: 10 mg
  Filled 2023-04-27: qty 2

## 2023-04-27 NOTE — Progress Notes (Signed)
Patient's brother, Harvie Heck at bedside and took patient's keys in order to help with care of patient's dogs and to find the patient's car.

## 2023-04-27 NOTE — Progress Notes (Signed)
Patient trialed on CPAP settings by P. Hoffman, NP for 10 minutes; patient began breathing at 7-8 breaths per minute and this nurse noted cardiac monitoring changes of [place]. Patient switched back over to ventilator support settings.

## 2023-04-27 NOTE — Progress Notes (Signed)
Patient trialed on CPAP settings by P. Hoffman, NP for approximately 10 minutes; patient began breathing at 5 breaths per minute; RT at bedside and patient switched back over to ventilator support settings.

## 2023-04-27 NOTE — Progress Notes (Signed)
[facility]enterology Progress Note  Nathan Roberts 62 y.o. Jan 20, 1961   Subjective: Sedated and intubated.   Objective: Vital signs: Vitals:   04/27/23 1111 04/27/23 1200  BP: (!) 160/80 (!) 153/74  Pulse: 90 90  Resp: 11 14  Temp: 97.9 F (36.6 C) 98.1 F (36.7 C)  SpO2: 99% 99%    Physical Exam: Gen: sedated, intubated, chronically ill-appearing, no acute distress, thin HEENT: +icteric sclera CV: RRR Chest: CTA B Abd: distended, nontender (no facial grimace), +BS Ext: no edema  Lab Results: Recent Labs    04/26/23 0519 04/27/23 0319  NA 132* 133*  K 4.6 4.8  CL 101 103  CO2 21* 19*  GLUCOSE 98 128*  BUN 23 32*  CREATININE 0.85 1.21  CALCIUM 9.0 8.8*  MG  --  2.2  PHOS  --  6.0*   Recent Labs    04/26/23 0519 04/27/23 0319  AST 51* 44*  ALT 23 21  ALKPHOS 82 79  BILITOT 4.7* 4.4*  PROT 6.7 6.3*  ALBUMIN 3.0* 2.8*   Recent Labs    04/26/23 0913 04/27/23 0319  WBC 8.4 6.9  NEUTROABS 6.0 3.9  HGB 11.5* 10.3*  HCT 35.5* 32.4*  MCV 99.7 100.9*  PLT 109* 107*      Assessment/Plan: Decompensated cirrhosis with hepatic encephalopathy and ascites - Lactulose and Rifaximin via OG tube. No stool in rectal pouch. May need to revert back to Lactulose enemas if no mental status improvement. Supportive care. Dr. Lorenso Quarry will f/u this weekend.   Nathan Roberts 04/27/2023, 2:01 PM  Questions please call [phone] ID: Nathan Roberts, male   DOB: [birth year], 62 y.o.   MRN: [id number]

## 2023-04-27 NOTE — Progress Notes (Addendum)
NAME:  Nathan Roberts, MRN:  [id number], DOB:  01/05/1961, LOS: 3 ADMISSION DATE:  May 10, 2023, CONSULTATION DATE: 9/26 REFERRING MD: Dr. Imogene Burn, CHIEF COMPLAINT: Hepatic encephalopathy  History of Present Illness:  Patient is encephalopathic and/or intubated; therefore, history has been obtained from chart review.  62 year old male with past medical history as below, which is significant for alcohol-related hepatic cirrhosis, ascites refractory to multiple paracentesis and diuretics, medical noncompliance, esophageal varices, and hepatic encephalopathy which had been well-controlled with medications.  Recently admitted to [facility] in late August for ascites.  He had a endoscopy at that time showing grade 1 esophageal varices.  He has been followed by both [facility], and [facility]. Last note from [facility] recommended Lasix and possibly spiro for volume. Cipro 500 daily for SBP ppx, and sodium restricted diet. He was felt to be a poor candidate for transplant due to social issues, history of non-compliance, and functional status. TIPs, however, was not considered to be off the table as his HE was able to be controlled with medications. Patient has declined TIPS in the past. Most recent paracentesis 9/20 during which 13L was removed.   He presented to [facility] ED 10-May-2023 via EMS after he was noted to exhibit unusual behavior at the grocery store. He was oriented to self in the ED, but was otherwise confused. Ammonia level was noted to be high in correlation to his history of hepatic encephalopathy. He received a dose of lactulose in the ED and was admitted to the hospitalists for treatment of HE with lactulose and decompensated cirrhosis with diuretics and SBP ppx. However, in the overnight hours 9/25-26 he became increasingly lethargic and ammonia level climbed despite treatment. He was transferred to the ICU minimally responsive with poor protection of his airway and ultimately vomited. PCCM consulted  for consideration of intubation.   Pertinent  Medical History   has a past medical history of Allergy, Arthritis, Carpal tunnel syndrome, Diabetes mellitus without complication (HCC), Esophageal varices (HCC), GERD (gastroesophageal reflux disease), GI bleed, Hyperlipidemia, Hypertension, Pancreas divisum (03/22/2023), Portal hypertensive gastropathy (HCC), and Pre-diabetes.   Significant Hospital Events: Including procedures, antibiotic start and stop dates in addition to other pertinent events   05/10/23 presented with confusion. Admitted for treatment of HE 9/26 tx to ICU for lethargy, vomiting, aspiration concern. Intubated.   Interim History / Subjective:  Remains intubated. No bowel movements overnight.  On PSV and tolerating well.   Objective   Blood pressure 103/60, pulse 71, temperature 97.9 F (36.6 C), resp. rate 17, height 5\' 8"  (1.727 m), weight 78.6 kg, SpO2 99%.    Vent Mode: PRVC FiO2 (%):  [40 %-100 %] 40 % Set Rate:  [16 bmp] 16 bmp Vt Set:  [550 mL] 550 mL PEEP:  [5 cmH20] 5 cmH20 Plateau Pressure:  [13 cmH20-16 cmH20] 13 cmH20   Intake/Output Summary (Last 24 hours) at 04/27/2023 4098 Last data filed at 04/27/2023 0720 Gross per 24 hour  Intake 1611.09 ml  Output 725 ml  Net 886.09 ml   Filed Weights   May 10, 2023 1549 04/26/23 0900  Weight: 79.2 kg 78.6 kg    Examination: General: cachectic middle aged male in NAD on vent HENT: [place]/AT, PERRL, no JVD Lungs:Clear bilateral breath sounds Cardiovascular: RRR, no MRG Abdomen: Protuberant, non-tender Extremities: No acute deformity, no edema Neuro: Eyes open spontaneously. No pain response. Cough/gag/corneal intact.   Resolved Hospital Problem list     Assessment & Plan:   Hepatic encephalopathy: secondary  to alcoholic cirrhosis. History of same. Reportedly compliant with home lactulose. CT 9/26 negative. - ICU monitoring - Gastric tube in place for lactulose and rifaximin administration - Will increase  frequency of lactulose until we have some clinical improvement.  - PRN sedation only for vent RASS goal 0 to -1.  - Trend ammonia  Acute respiratory failure Inability to protect airway secondary to above.  Possible aspiration of emesis - Full vent support - PSV as tolerated - VAP bundle  Alcoholic cirrhosis: 9/26 MELD 18, MELD-Na 22, CP grade C Refractory ascites Portal HTN - diuresis with lasix, spironolactone as BP will tolerate - can consider paracentesis when more stable, no infectious indication tap (PCT pending) - Not currently at TIPs candidate with active HE, although hasn't been ruled out long term by primary GI.  - Continue cipro for SBP ppx - not a transplant candidate - Trend LFT/ammonia/bili/coags - holding propranolol until more stable - Appreciate GI recs - Palliative medicine consult pending  Chronic hypotension:  - holding midodrine, not needed currently.   DM - CBG monitoring and SSI  Hyponatremia: chronic secondary to cirrhosis, stable - Trend chemistry   Goals of care: Dr. Imogene Burn was able to engage the patient's wife (separated) overnight who endorsed full code, however, I have been unable to contact her this morning. I will continue to attempt to get in touch with her for an update. Palliative medicine was consulted 9/25. Evaluation is pending.  Attempted to contact the patient's wife [name] today. Left a message for her to call back.    Best Practice (right click and "Reselect all SmartList Selections" daily)   Diet/type: NPO DVT prophylaxis: not indicated GI prophylaxis: PPI Lines: N/A Foley:  N/A Code Status:  full code Last date of multidisciplinary goals of care discussion [  ]  Labs   CBC: Recent Labs  Lab 05-12-2023 1600 04/25/23 0542 04/26/23 0913 04/27/23 0319  WBC 4.7 4.7 8.4 6.9  NEUTROABS  --   --  6.0 3.9  HGB 10.0* 10.1* 11.5* 10.3*  HCT 31.1* 30.8* 35.5* 32.4*  MCV 99.4 97.8 99.7 100.9*  PLT 101* 91* 109* 107*    Basic  Metabolic Panel: Recent Labs  Lab 2023-05-12 1600 04/25/23 0542 04/26/23 0519 04/27/23 0319  NA 131* 129* 132* 133*  K 4.1 4.0 4.6 4.8  CL 99 100 101 103  CO2 24 22 21* 19*  GLUCOSE 126* 97 98 128*  BUN 18 18 23  32*  CREATININE 0.65 0.62 0.85 1.21  CALCIUM 8.5* 8.5* 9.0 8.8*  MG  --   --   --  2.2  PHOS  --   --   --  6.0*   GFR: Estimated Creatinine Clearance: 59.7 mL/min (by C-G formula based on SCr of 1.21 mg/dL). Recent Labs  Lab 2023-05-12 1600 04/25/23 0542 04/26/23 0913 04/27/23 0319  PROCALCITON  --   --  0.28  --   WBC 4.7 4.7 8.4 6.9    Liver Function Tests: Recent Labs  Lab 05-12-2023 1600 04/25/23 0542 04/26/23 0519 04/27/23 0319  AST 54* 48* 51* 44*  ALT 22 20 23 21   ALKPHOS 99 88 82 79  BILITOT 2.9* 3.0* 4.7* 4.4*  PROT 6.5 6.0* 6.7 6.3*  ALBUMIN 2.9* 2.8* 3.0* 2.8*   No results for input(s): "LIPASE", "AMYLASE" in the last 168 hours. Recent Labs  Lab May 12, 2023 1600 04/26/23 0519  AMMONIA 147* 213*    ABG    Component Value Date/Time   PHART 7.47 (H)  04/27/2023 0420   PCO2ART 29 (L) 04/27/2023 0420   PO2ART 161 (H) 04/27/2023 0420   HCO3 20.6 04/27/2023 0420   ACIDBASEDEF 2.1 (H) 04/27/2023 0420   O2SAT 100 04/27/2023 0420     Coagulation Profile: Recent Labs  Lab 04/19/2023 1600 04/26/23 0913  INR 1.7* 1.6*    Cardiac Enzymes: No results for input(s): "CKTOTAL", "CKMB", "CKMBINDEX", "TROPONINI" in the last 168 hours.  HbA1C: Hgb A1c MFr Bld  Date/Time Value Ref Range Status  03/22/2023 03:06 AM 4.6 (L) 4.8 - 5.6 % Final    Comment:    (NOTE) Pre diabetes:          5.7%-6.4%  Diabetes:              >6.4%  Glycemic control for   <7.0% adults with diabetes   12/22/2022 11:01 AM 4.8 4.6 - 6.5 % Final    Comment:    Glycemic Control Guidelines for People with Diabetes:Non Diabetic:  <6%Goal of Therapy: <7%Additional Action Suggested:  >8%     CBG: Recent Labs  Lab 04/26/23 1145 04/26/23 1602 04/26/23 1938 04/26/23 2348  04/27/23 0340  GLUCAP 100* 118* 122* 113* 134*    Review of Systems:   Patient is encephalopathic and/or intubated; therefore, history has been obtained from chart review.    Past Medical History:  He,  has a past medical history of Allergy, Arthritis, Carpal tunnel syndrome, Diabetes mellitus without complication (HCC), Esophageal varices (HCC), GERD (gastroesophageal reflux disease), GI bleed, Hyperlipidemia, Hypertension, Pancreas divisum (03/22/2023), Portal hypertensive gastropathy (HCC), and Pre-diabetes.   Surgical History:   Past Surgical History:  Procedure Laterality Date   abscess     rectal area   CARPAL TUNNEL RELEASE     left   CARPAL TUNNEL RELEASE Right 07/07/2021   Procedure: RIGHT CARPAL TUNNEL RELEASE;  Surgeon: Cindee Salt, MD;  Location: [facility];  Service: Orthopedics;  Laterality: Right;   CERVICAL FUSION     COLONOSCOPY WITH PROPOFOL N/A 05/29/2022   Procedure: COLONOSCOPY WITH PROPOFOL;  Surgeon: Napoleon Form, MD;  Location: [facility];  Service: Gastroenterology;  Laterality: N/A;   ESOPHAGEAL BANDING  10/07/2020   Procedure: ESOPHAGEAL BANDING;  Surgeon: Napoleon Form, MD;  Location: [facility];  Service: Endoscopy;;   ESOPHAGOGASTRODUODENOSCOPY N/A 03/29/2023   Procedure: ESOPHAGOGASTRODUODENOSCOPY (EGD);  Surgeon: Lynann Bologna, DO;  Location: [facility];  Service: Gastroenterology;  Laterality: N/A;   ESOPHAGOGASTRODUODENOSCOPY (EGD) WITH PROPOFOL N/A 10/07/2020   Procedure: ESOPHAGOGASTRODUODENOSCOPY (EGD) WITH PROPOFOL;  Surgeon: Napoleon Form, MD;  Location: [facility];  Service: Endoscopy;  Laterality: N/A;   ESOPHAGOGASTRODUODENOSCOPY (EGD) WITH PROPOFOL N/A 01/18/2021   Procedure: ESOPHAGOGASTRODUODENOSCOPY (EGD) WITH PROPOFOL;  Surgeon: Napoleon Form, MD;  Location: [facility];  Service: Endoscopy;  Laterality: N/A;   ESOPHAGOGASTRODUODENOSCOPY (EGD) WITH PROPOFOL N/A 05/29/2022   Procedure:  ESOPHAGOGASTRODUODENOSCOPY (EGD) WITH PROPOFOL;  Surgeon: Napoleon Form, MD;  Location: [facility];  Service: Gastroenterology;  Laterality: N/A;   FASCIECTOMY Left 02/10/2021   Procedure: FASCIECTOMY RING FINGER AND SMALL FINGER OF LEFT HAND;  Surgeon: Cindee Salt, MD;  Location: [facility];  Service: Orthopedics;  Laterality: Left;   FASCIECTOMY Right 07/07/2021   Procedure: FASCIECTOMY RIGHT 1ST WEB, RIGHT RING FINGER, RIGHT SMALL FINGER;  Surgeon: Cindee Salt, MD;  Location: [facility];  Service: Orthopedics;  Laterality: Right;   SPINE SURGERY       Social History:   reports that he quit smoking about 83  years ago. His smoking use included cigarettes. He has never used smokeless tobacco. He reports current alcohol use of about 9.0 standard drinks of alcohol per week. He reports that he does not use drugs.   Family History:  His family history includes Alcohol abuse in his mother; Arthritis in his paternal grandmother; Atrial fibrillation in his father; COPD in his father; Cancer in his paternal grandfather; Early death in his mother; Heart disease in his father; Hyperlipidemia in his father; Hypertension in his father; Lung cancer (age of onset: 3) in his mother. There is no history of Colon cancer, Esophageal cancer, Stomach cancer, or Rectal cancer.   Allergies Allergies  Allergen Reactions   Cephalexin Rash   Doxycycline Rash   Latex Hives and Rash     Home Medications  Prior to Admission medications   Medication Sig Start Date End Date Taking? Authorizing Provider  ciprofloxacin (CIPRO) 500 MG tablet Take 1 tablet (500 mg total) by mouth daily with breakfast. 03/31/23 04/30/23  Willeen Niece, MD  furosemide (LASIX) 40 MG tablet Take 1 tablet (40 mg total) by mouth 2 (two) times daily at 10 am and 4 pm. 02/20/23 04/25/23  Clayborne Dana, NP  lactulose (CHRONULAC) 10 GM/15ML solution Take 30 mLs (20 g total) by mouth 3 (three) times daily. 03/30/23    Willeen Niece, MD  metFORMIN (GLUCOPHAGE) 500 MG tablet Take 500 mg by mouth daily.    [provider]  midodrine (PROAMATINE) 10 MG tablet Take 1 tablet (10 mg total) by mouth 3 (three) times daily after meals. 03/30/23 04/29/23  Willeen Niece, MD  pantoprazole (PROTONIX) 40 MG tablet Take 1 tablet (40 mg total) by mouth 2 (two) times daily before a meal. 02/20/23   Clayborne Dana, NP  spironolactone (ALDACTONE) 25 MG tablet Take 1 tablet (25 mg total) by mouth daily. 02/20/23 04/25/23  Clayborne Dana, NP  sucralfate (CARAFATE) 1 GM/10ML suspension Take 10 mLs (1 g total) by mouth 2 (two) times daily. 03/30/23   Willeen Niece, MD     Critical care time: 46 minutes     Joneen Roach, AGACNP-BC [facility]  See Amion for personal pager PCCM on call pager [phone] until 7pm. Please call Elink 7p-7a. [phone]  04/27/2023 7:22 AM

## 2023-04-27 NOTE — Plan of Care (Signed)
  Problem: Clinical Measurements: Goal: Ability to maintain clinical measurements within normal limits will improve Outcome: Progressing   Problem: Education: Goal: Knowledge of General Education information will improve Description: Including pain rating scale, medication(s)/side effects and non-pharmacologic comfort measures Outcome: Not Progressing   Problem: Health Behavior/Discharge Planning: Goal: Ability to manage health-related needs will improve Outcome: Not Progressing   Problem: Clinical Measurements: Goal: Respiratory complications will improve Outcome: Not Progressing   Problem: Pain Managment: Goal: General experience of comfort will improve Outcome: Not Progressing   Problem: Respiratory: Goal: Ability to maintain a clear airway and adequate ventilation will improve Outcome: Not Progressing

## 2023-04-27 NOTE — Progress Notes (Signed)
Chaplains have been working to obtain [place] at [employer]. Nathan Roberts has not been able to reach a Priest at this time but will continue to work on this matter to honor request.    04/27/23 1300  Spiritual Encounters  Type of Visit Initial;Attempt (pt unavailable)  Care provided to: Pt not available

## 2023-04-27 NOTE — Progress Notes (Signed)
Daily Progress Note   Patient Name: Nathan Roberts       Date: 04/27/2023 DOB: Mar 31, 1961  Age: 62 y.o. MRN#: [id number] Attending Physician: Steffanie Dunn, DO Primary Care Physician: Clayborne Dana, NP Admit Date: 04/15/2023 Length of Stay: 3 days  Reason for Consultation/Follow-up: Establishing goals of care  Subjective:   CC: Patient remains intubated and sedated on ventilator support. Following up regarding complex medical decision making.   Subjective:  EMR prior to presenting to bedside.  Discussed care with PCCM providers as well to coordinate patient's care.  It has been determined that patient is still married officially though is not living with wife.  Patient's brother has called and establish contact.  Those listed in patient's chart under next of kin contacts.  Multiple providers have attempted to contact wife and left voicemails without return of call.  Presented to bedside to check on patient.  Patient remains intubated and sedated.  No family present at bedside.  Later in afternoon, was able to call patient's s, Nathan Roberts, and she did answer the phone.  Able to introduce myself as a member of the palliative medicine team and my role in patient's care.  Nathan Roberts did acknowledge that she and patient are still married though they live separately.  Nathan Roberts noted she is unable to travel here due to her own medical conditions. Inquired if Nathan Roberts would want to assist in making medical decisions on patient's behalf since he is currently unable to and she noted that she would want to be involved in patient's care as she still has a good relationship with patient and patient's family.  Nathan Roberts also noted she would always consult patient's family including his brother before making any medical decisions so they could discuss patient's care as a family.  Acknowledged her thoughts regarding this.  Permission, able to share patient's medical status at this time.  Nathan Roberts knew patient was intubated  because she had discussed this with patient's brother yesterday evening.  Able to spend time explaining patient's underlying medical condition from alcoholic liver cirrhosis.  Nathan Roberts tearful during conversation.  Nathan Roberts described how patient had chronic neck pain and when he had surgery and that did not fix neck pain, he turned to drinking to manage his pain.  So he hates the patient is now in this position because he was trying to manage his own pain.  Acknowledged difficulties with situation. Discussed continuing appropriate medical management at this time and attempts to improve patient's mental status so he can be extubated off the ventilator.  Noted importance of discussion between family and patient moving forward if patient develops ability to participate due to the chronic nature of his medical illness and multiple hospitalizations.  Discussed without liver transplant, patient has been informed he is not a candidate for, patient will continue to have this liver disease.  Discussed that based on [place] law since patient is still technically married, decision making falls to her.  Explained considerations for generalized medical interventions moving forward.  Discussed CODE STATUS and introduced idea that if patient were sick enough his heart were to stop, concerned intervention such as cardiac resuscitation with CPR would not be beneficial for patient to add quality time to his life.  Noted importance of these type of conversations with patient's family and her.  Spent time providing emotional support and answering medical questions to the best of my ability.  Provided emotional support via active listening.  Wife noted she would be reaching out to  patient's brother to discuss patient's care.  She also said that patient's brother would be coming here from [place] because patient have dogs at home and so these need to be checked on him.  All questions answered at that time.  Noted palliative medicine team  will continue to follow along with patient's medical journey.  Discussed care with IDT after phone call with wife.  PCCM provider did note that patient's brother had arrived at bedside and was planning to go check on patient's home.  Also informed patient has an adult son by brother; his brother trying to contact him.  Noted could involved wife in conversations via speaker phone moving forward.   Objective:   Vital Signs:  BP 103/60   Pulse 71   Temp 97.9 F (36.6 C)   Resp 17   Ht 5\' 8"  (1.727 m)   Wt 78.6 kg   SpO2 99%   BMI 26.35 kg/m   Physical Exam: General: intubated, sedated, chronically ill appearing  Eyes: scleral jaundice  HENT: ET tube in place Cardiovascular: RRR Respiratory: intubated on ventilator support  Abdomen: distended Skin: multiple ecchymosis on UEs b/l Neuro: sedated  Imaging: I personally reviewed recent imaging.   Assessment & Plan:   Assessment: Patient is a 62 year old male with a past medical history of alcohol related hepatic cirrhosis complicated by ascites refractory to multiple paracenteses and diuretics and esophageal varices and hepatic encephalopathy and arthritis who was admitted on 2023-05-22 for management of altered mental status with unusual behavior at a grocery store.  During admission he was noted to have elevated ammonia levels.  Patient initially admitted for management of hepatic encephalopathy though patient had worsening lethargy and ammonia overnight despite aggressive medical interventions and so required transfer to the ICU.  Patient vomited and ultimately required intubation for protection of his airway.  PCCM consulted and now managing care.  GI consulted for management of decompensated cirrhosis.  Palliative medicine team consulted to assist with complex medical decision making. Of note, patient seen by palliative medicine team on 03/24/2023 during that admission.  Recommendations/Plan: # Complex medical decision making/goals  of care:  -Patient unable to participate in complex medical decision due to current medical status.                 -Discussed care with patient's wife via phone as detailed above in HPI.  Spent time discussing patient's overall medical condition and disease process.  Explained continuing appropriate medical interventions at this time and attempts to improve patient's mental status so he he can be appropriately off of ventilator support.  Until patient is able to participate, patient's spouse willing to be involved in making medical decisions for him.  Also informed patient has an adult son who family is trying to contact.  Emphasized importance of continued conversations between family and medical providers moving forward.                -  Code Status: Full Code   # Symptom management:  -As per PCCM   # Psychosocial Support:  -wife (separated though not divorced), brother   # Discharge Planning: To Be Determined  Discussed with: PCCM provider, RN  Thank you for allowing the palliative care team to participate in the care Dineen Kid.  Alvester Morin, DO Palliative Care Provider PMT # [phone]  If patient remains symptomatic despite maximum doses, please call PMT at [phone] between 0700 and 1900. Outside of these hours, please call attending, as PMT does  not have night coverage.  This provider spent a total of 53 minutes providing patient's care.  Includes review of EMR, discussing care with other staff members involved in patient's medical care, obtaining relevant history and information from patient and/or patient's family, and personal review of imaging and lab work. Greater than 50% of the time was spent counseling and coordinating care related to the above assessment and plan.    *Please note that this is a verbal dictation therefore any spelling or grammatical errors are due to the "[employer]agon [employer] One" system interpretation.

## 2023-04-28 ENCOUNTER — Inpatient Hospital Stay (HOSPITAL_COMMUNITY): Payer: 59

## 2023-04-28 DIAGNOSIS — Z7189 Other specified counseling: Secondary | ICD-10-CM

## 2023-04-28 DIAGNOSIS — K7031 Alcoholic cirrhosis of liver with ascites: Secondary | ICD-10-CM | POA: Diagnosis not present

## 2023-04-28 DIAGNOSIS — Z515 Encounter for palliative care: Secondary | ICD-10-CM | POA: Diagnosis not present

## 2023-04-28 DIAGNOSIS — K7682 Hepatic encephalopathy: Secondary | ICD-10-CM | POA: Diagnosis not present

## 2023-04-28 LAB — BODY FLUID CELL COUNT WITH DIFFERENTIAL
Lymphs, Fluid: 65 %
Monocyte-Macrophage-Serous Fluid: 28 % — ABNORMAL LOW (ref 50–90)
Neutrophil Count, Fluid: 7 % (ref 0–25)
Total Nucleated Cell Count, Fluid: 108 mm3 (ref 0–1000)

## 2023-04-28 LAB — COMPREHENSIVE METABOLIC PANEL
ALT: 24 U/L (ref 0–44)
AST: 57 U/L — ABNORMAL HIGH (ref 15–41)
Albumin: 2.7 g/dL — ABNORMAL LOW (ref 3.5–5.0)
Alkaline Phosphatase: 90 U/L (ref 38–126)
Anion gap: 8 (ref 5–15)
BUN: 33 mg/dL — ABNORMAL HIGH (ref 8–23)
CO2: 20 mmol/L — ABNORMAL LOW (ref 22–32)
Calcium: 8.4 mg/dL — ABNORMAL LOW (ref 8.9–10.3)
Chloride: 108 mmol/L (ref 98–111)
Creatinine, Ser: 0.88 mg/dL (ref 0.61–1.24)
GFR, Estimated: >60 mL/min (ref >60)
Glucose, Bld: 135 mg/dL — ABNORMAL HIGH (ref 70–99)
Potassium: 3.9 mmol/L (ref 3.5–5.1)
Sodium: 136 mmol/L (ref 135–145)
Total Bilirubin: 3.2 mg/dL — ABNORMAL HIGH (ref 0.3–1.2)
Total Protein: 6.4 g/dL — ABNORMAL LOW (ref 6.5–8.1)

## 2023-04-28 LAB — CBC
HCT: 31.5 % — ABNORMAL LOW (ref 39.0–52.0)
Hemoglobin: 10 g/dL — ABNORMAL LOW (ref 13.0–17.0)
MCH: 31.7 pg (ref 26.0–34.0)
MCHC: 31.7 g/dL (ref 30.0–36.0)
MCV: 100 fL (ref 80.0–100.0)
Platelets: 119 10*3/uL — ABNORMAL LOW (ref 150–400)
RBC: 3.15 MIL/uL — ABNORMAL LOW (ref 4.22–5.81)
RDW: 15.8 % — ABNORMAL HIGH (ref 11.5–15.5)
WBC: 8.9 10*3/uL (ref 4.0–10.5)
nRBC: 0 % (ref 0.0–0.2)

## 2023-04-28 LAB — GLUCOSE, CAPILLARY
Glucose-Capillary: 140 mg/dL — ABNORMAL HIGH (ref 70–99)
Glucose-Capillary: 141 mg/dL — ABNORMAL HIGH (ref 70–99)
Glucose-Capillary: 152 mg/dL — ABNORMAL HIGH (ref 70–99)
Glucose-Capillary: 141 mg/dL — ABNORMAL HIGH (ref 70–99)
Glucose-Capillary: 108 mg/dL — ABNORMAL HIGH (ref 70–99)
Glucose-Capillary: 118 mg/dL — ABNORMAL HIGH (ref 70–99)

## 2023-04-28 LAB — MAGNESIUM
Magnesium: 2.2 mg/dL (ref 1.7–2.4)
Magnesium: 2.2 mg/dL (ref 1.7–2.4)

## 2023-04-28 LAB — ALBUMIN, PLEURAL OR PERITONEAL FLUID: Albumin, Fluid: <1.5 g/dL

## 2023-04-28 LAB — FOLATE: Folate: 31.7 ng/mL (ref >5.9)

## 2023-04-28 LAB — PHOSPHORUS
Phosphorus: 3 mg/dL (ref 2.5–4.6)
Phosphorus: 2.5 mg/dL (ref 2.5–4.6)

## 2023-04-28 LAB — PROTEIN, PLEURAL OR PERITONEAL FLUID: Total protein, fluid: <3 g/dL

## 2023-04-28 LAB — PROTIME-INR
INR: 1.8 — ABNORMAL HIGH (ref 0.8–1.2)
Prothrombin Time: 21.5 s — ABNORMAL HIGH (ref 11.4–15.2)

## 2023-04-28 LAB — AMMONIA: Ammonia: 110 umol/L — ABNORMAL HIGH (ref 9–35)

## 2023-04-28 MED ORDER — BISACODYL 10 MG RE SUPP: 10.0000 mg | Freq: Every day | RECTAL | Status: DC

## 2023-04-28 MED ORDER — LIDOCAINE HCL 1 % IJ SOLN
INTRAMUSCULAR | Status: AC
  Filled 2023-04-28: qty 20

## 2023-04-28 MED ORDER — LACTULOSE ENEMA
300.0000 mL | Freq: Two times a day (BID) | ORAL | Status: DC
  Filled 2023-04-28 (×2): qty 300

## 2023-04-28 MED ORDER — ALBUMIN HUMAN 25 % IV SOLN
75.0000 g | Freq: Once | INTRAVENOUS | Status: AC
  Administered 2023-04-28: 75 g via INTRAVENOUS
  Filled 2023-04-28: qty 300

## 2023-04-28 NOTE — Procedures (Signed)
PROCEDURE SUMMARY:  Successful image-guided paracentesis from the right lower abdomen.  Yielded 9.2 liters of golden yellow fluid.  No immediate complications.  EBL = trace. Patient tolerated well.   Specimen was sent for labs.  Please see imaging section of Epic for full dictation.   Kennieth Francois PA-C 04/28/2023 2:33 PM

## 2023-04-28 NOTE — Progress Notes (Signed)
Called to discuss/update spouse  Micah Flesher to generic voicemail box -Left message for her to call us back

## 2023-04-28 NOTE — Plan of Care (Signed)
Attempted to reach patient's wife and patient's brother at phone numbers listed in chart to obtain consent for requested paracentesis. No answer, will attempt again. Paracentesis on hold pending consent at this time. Kennieth Francois, PA-C 04/28/2023 12:37 PM

## 2023-04-28 NOTE — Progress Notes (Signed)
eLink Physician-Brief Progress Note Patient Name: Nathan Roberts DOB: 1961-04-25 MRN: [id number]   Date of Service  04/28/2023  HPI/Events of Note  Patient with alcoholic cirrhosis who presented with hepatic encephalopathy.  On propranolol and spironolactone.  Also requiring intermittent Versed for vent synchrony.  Borderline hypotension following midazolam administration.  MAP of 61 at the time.  Current MAP is 64.  eICU Interventions  Patient's chart reviewed.  Pertinent labs and imaging studies reviewed.  At this time, there is not an indication for vasopressor administration.  Will hold propranolol which can drop blood pressure for now.  Will start a pressor agent if MAP remains persistently below 60.  Discussed with bedside nurse.     Intervention Category Major Interventions: Hypotension - evaluation and management  Carilyn Goodpasture 04/28/2023, 9:47 PM

## 2023-04-28 NOTE — Plan of Care (Signed)
  Problem: Education: Goal: Knowledge of General Education information will improve Description: Including pain rating scale, medication(s)/side effects and non-pharmacologic comfort measures Outcome: Not Progressing   Problem: Health Behavior/Discharge Planning: Goal: Ability to manage health-related needs will improve Outcome: Not Progressing   Problem: Clinical Measurements: Goal: Will remain free from infection Outcome: Not Progressing Goal: Diagnostic test results will improve Outcome: Not Progressing Goal: Respiratory complications will improve Outcome: Not Progressing   Problem: Activity: Goal: Risk for activity intolerance will decrease Outcome: Not Progressing

## 2023-04-28 NOTE — Progress Notes (Signed)
NAME:  Nathan Roberts, MRN:  [id number], DOB:  March 09, 1961, LOS: 4 ADMISSION DATE:  05-23-23, CONSULTATION DATE: 9/26 REFERRING MD: Dr. Imogene Burn, CHIEF COMPLAINT: Hepatic encephalopathy  History of Present Illness:  Patient is encephalopathic and/or intubated; therefore, history has been obtained from chart review.  62 year old male with past medical history as below, which is significant for alcohol-related hepatic cirrhosis, ascites refractory to multiple paracentesis and diuretics, medical noncompliance, esophageal varices, and hepatic encephalopathy which had been well-controlled with medications.  Recently admitted to [facility] in late August for ascites.  He had a endoscopy at that time showing grade 1 esophageal varices.  He has been followed by both [facility], and [facility]. Last note from [facility] recommended Lasix and possibly spiro for volume. Cipro 500 daily for SBP ppx, and sodium restricted diet. He was felt to be a poor candidate for transplant due to social issues, history of non-compliance, and functional status. TIPs, however, was not considered to be off the table as his HE was able to be controlled with medications. Patient has declined TIPS in the past. Most recent paracentesis 9/20 during which 13L was removed.   He presented to [facility] ED 2023-05-23 via EMS after he was noted to exhibit unusual behavior at the grocery store. He was oriented to self in the ED, but was otherwise confused. Ammonia level was noted to be high in correlation to his history of hepatic encephalopathy. He received a dose of lactulose in the ED and was admitted to the hospitalists for treatment of HE with lactulose and decompensated cirrhosis with diuretics and SBP ppx. However, in the overnight hours 9/25-26 he became increasingly lethargic and ammonia level climbed despite treatment. He was transferred to the ICU minimally responsive with poor protection of his airway and ultimately vomited. PCCM consulted  for consideration of intubation.   Pertinent  Medical History   has a past medical history of Allergy, Arthritis, Carpal tunnel syndrome, Diabetes mellitus without complication (HCC), Esophageal varices (HCC), GERD (gastroesophageal reflux disease), GI bleed, Hyperlipidemia, Hypertension, Pancreas divisum (03/22/2023), Portal hypertensive gastropathy (HCC), and Pre-diabetes.   Significant Hospital Events: Including procedures, antibiotic start and stop dates in addition to other pertinent events   May 23, 2023 presented with confusion. Admitted for treatment of HE 9/26 tx to ICU for lethargy, vomiting, aspiration concern. Intubated.  9/28 failed weaning-  Interim History / Subjective:  Remains intubated.  No bowel motions yet Remains on lactulose  Objective   Blood pressure 137/83, pulse 74, temperature 99.7 F (37.6 C), resp. rate 17, height 5\' 8"  (1.727 m), weight 78.6 kg, SpO2 99%.    Vent Mode: PRVC FiO2 (%):  [40 %] 40 % Set Rate:  [12 bmp] 12 bmp Vt Set:  [550 mL] 550 mL PEEP:  [5 cmH20] 5 cmH20 Plateau Pressure:  [14 cmH20] 14 cmH20   Intake/Output Summary (Last 24 hours) at 04/28/2023 1100 Last data filed at 04/28/2023 0900 Gross per 24 hour  Intake 3370.26 ml  Output 750 ml  Net 2620.26 ml   Filed Weights   05/23/23 1549 04/26/23 0900  Weight: 79.2 kg 78.6 kg    Examination: General: Cachectic, middle-aged HENT: Moist oral mucosa, endotracheal tube in place Lungs: Clear breath sounds Cardiovascular: S1-S2 appreciated Abdomen: Protuberant, nontender Extremities: No edema Neuro: Eyes open spontaneously. No pain response. Cough/gag/corneal intact.   Resolved Hospital Problem list     Assessment & Plan:   Acute hypoxemic respiratory failure Inability to protect his airway Possible aspiration -Continue  mechanical ventilation -Target TVol 6-8cc/kgIBW -Target Plateau Pressure < 30cm H20 -Target driving pressure less than 15 cm of water -Target PaO2 55-65: titrate  PEEP/FiO2 per protocol -Ventilator associated pneumonia prevention protocol  Mental status precludes being able to wean at present -On as needed sedatives and has not been receiving much  Hepatic encephalopathy secondary to alcoholic cirrhosis Apparently was compliant with lactulose at home -Continue lactulose and rifaximin -Dose of lactulose increased -Continue to trend ammonia level  Alcoholic cirrhosis with MELD of 18, MELD sodium of 22 Refractory ascites Portal hypertension -Continue diuresis with Lasix spironolactone as BP tolerates -Continue Cipro for SBP prophylaxis -Not a transplant candidate -Continue to trend ammonia and LFTs -Appreciate GI recommendations -Appreciate palliative care involvement -Add Dulcolax  Acute kidney injury -Maintain renal perfusion -Hold spironolactone, monitor closely  Hypertension -Continue to monitor -Propranolol started  Diabetes Continue CBG monitoring -Continue SSI  Hyponatremia -Related to cirrhosis  Goals of care: Dr. Imogene Burn was able to engage the patient's wife (separated) overnight who endorsed full code, however, Joneen Roach unable to contact spouse. Palliative medicine was consulted 9/25.   Attempted to contact the patient's wife [name] 9/28. Left a message for her to call back.   Will contact the spouse again today  Best Practice (right click and "Reselect all SmartList Selections" daily)   Diet/type: NPO DVT prophylaxis: not indicated GI prophylaxis: PPI Lines: N/A Foley:  N/A Code Status:  full code Last date of multidisciplinary goals of care discussion [  ]  Labs   CBC: Recent Labs  Lab Apr 25, 2023 1600 04/25/23 0542 04/26/23 0913 04/27/23 0319 04/28/23 0522  WBC 4.7 4.7 8.4 6.9 8.9  NEUTROABS  --   --  6.0 3.9  --   HGB 10.0* 10.1* 11.5* 10.3* 10.0*  HCT 31.1* 30.8* 35.5* 32.4* 31.5*  MCV 99.4 97.8 99.7 100.9* 100.0  PLT 101* 91* 109* 107* 119*    Basic Metabolic Panel: Recent Labs  Lab  04-25-2023 1600 04/25/23 0542 04/26/23 0519 04/27/23 0319 04/27/23 1636 04/28/23 0522  NA 131* 129* 132* 133*  --  136  K 4.1 4.0 4.6 4.8  --  3.9  CL 99 100 101 103  --  108  CO2 24 22 21* 19*  --  20*  GLUCOSE 126* 97 98 128*  --  135*  BUN 18 18 23  32*  --  33*  CREATININE 0.65 0.62 0.85 1.21  --  0.88  CALCIUM 8.5* 8.5* 9.0 8.8*  --  8.4*  MG  --   --   --  2.2 2.4 2.2  PHOS  --   --   --  6.0* 4.7* 3.0   GFR: Estimated Creatinine Clearance: 82 mL/min (by C-G formula based on SCr of 0.88 mg/dL). Recent Labs  Lab 04/25/23 0542 04/26/23 0913 04/27/23 0319 04/28/23 0522  PROCALCITON  --  0.28  --   --   WBC 4.7 8.4 6.9 8.9    Liver Function Tests: Recent Labs  Lab 25-Apr-2023 1600 04/25/23 0542 04/26/23 0519 04/27/23 0319 04/28/23 0522  AST 54* 48* 51* 44* 57*  ALT 22 20 23 21 24   ALKPHOS 99 88 82 79 90  BILITOT 2.9* 3.0* 4.7* 4.4* 3.2*  PROT 6.5 6.0* 6.7 6.3* 6.4*  ALBUMIN 2.9* 2.8* 3.0* 2.8* 2.7*   No results for input(s): "LIPASE", "AMYLASE" in the last 168 hours. Recent Labs  Lab April 25, 2023 1600 04/26/23 0519 04/27/23 0812 04/28/23 0522  AMMONIA 147* 213* 149* 110*    ABG  Component Value Date/Time   PHART 7.47 (H) 04/27/2023 0420   PCO2ART 29 (L) 04/27/2023 0420   PO2ART 161 (H) 04/27/2023 0420   HCO3 20.6 04/27/2023 0420   ACIDBASEDEF 2.1 (H) 04/27/2023 0420   O2SAT 100 04/27/2023 0420     Coagulation Profile: Recent Labs  Lab May 16, 2023 1600 04/26/23 0913  INR 1.7* 1.6*    Cardiac Enzymes: No results for input(s): "CKTOTAL", "CKMB", "CKMBINDEX", "TROPONINI" in the last 168 hours.  HbA1C: Hgb A1c MFr Bld  Date/Time Value Ref Range Status  03/22/2023 03:06 AM 4.6 (L) 4.8 - 5.6 % Final    Comment:    (NOTE) Pre diabetes:          5.7%-6.4%  Diabetes:              >6.4%  Glycemic control for   <7.0% adults with diabetes   12/22/2022 11:01 AM 4.8 4.6 - 6.5 % Final    Comment:    Glycemic Control Guidelines for People with  Diabetes:Non Diabetic:  <6%Goal of Therapy: <7%Additional Action Suggested:  >8%     CBG: Recent Labs  Lab 04/27/23 1546 04/27/23 1934 04/27/23 2314 04/28/23 0315 04/28/23 0817  GLUCAP 153* 156* 135* 140* 141*    Review of Systems:   Patient is encephalopathic and/or intubated; therefore, history has been obtained from chart review.    Past Medical History:  He,  has a past medical history of Allergy, Arthritis, Carpal tunnel syndrome, Diabetes mellitus without complication (HCC), Esophageal varices (HCC), GERD (gastroesophageal reflux disease), GI bleed, Hyperlipidemia, Hypertension, Pancreas divisum (03/22/2023), Portal hypertensive gastropathy (HCC), and Pre-diabetes.   Surgical History:   Past Surgical History:  Procedure Laterality Date   abscess     rectal area   CARPAL TUNNEL RELEASE     left   CARPAL TUNNEL RELEASE Right 07/07/2021   Procedure: RIGHT CARPAL TUNNEL RELEASE;  Surgeon: Cindee Salt, MD;  Location: [facility];  Service: Orthopedics;  Laterality: Right;   CERVICAL FUSION     COLONOSCOPY WITH PROPOFOL N/A 05/29/2022   Procedure: COLONOSCOPY WITH PROPOFOL;  Surgeon: Napoleon Form, MD;  Location: [facility];  Service: Gastroenterology;  Laterality: N/A;   ESOPHAGEAL BANDING  10/07/2020   Procedure: ESOPHAGEAL BANDING;  Surgeon: Napoleon Form, MD;  Location: [facility];  Service: Endoscopy;;   ESOPHAGOGASTRODUODENOSCOPY N/A 03/29/2023   Procedure: ESOPHAGOGASTRODUODENOSCOPY (EGD);  Surgeon: Lynann Bologna, DO;  Location: [facility];  Service: Gastroenterology;  Laterality: N/A;   ESOPHAGOGASTRODUODENOSCOPY (EGD) WITH PROPOFOL N/A 10/07/2020   Procedure: ESOPHAGOGASTRODUODENOSCOPY (EGD) WITH PROPOFOL;  Surgeon: Napoleon Form, MD;  Location: [facility];  Service: Endoscopy;  Laterality: N/A;   ESOPHAGOGASTRODUODENOSCOPY (EGD) WITH PROPOFOL N/A 01/18/2021   Procedure: ESOPHAGOGASTRODUODENOSCOPY (EGD) WITH PROPOFOL;  Surgeon:  Napoleon Form, MD;  Location: [facility];  Service: Endoscopy;  Laterality: N/A;   ESOPHAGOGASTRODUODENOSCOPY (EGD) WITH PROPOFOL N/A 05/29/2022   Procedure: ESOPHAGOGASTRODUODENOSCOPY (EGD) WITH PROPOFOL;  Surgeon: Napoleon Form, MD;  Location: [facility];  Service: Gastroenterology;  Laterality: N/A;   FASCIECTOMY Left 02/10/2021   Procedure: FASCIECTOMY RING FINGER AND SMALL FINGER OF LEFT HAND;  Surgeon: Cindee Salt, MD;  Location: [facility];  Service: Orthopedics;  Laterality: Left;   FASCIECTOMY Right 07/07/2021   Procedure: FASCIECTOMY RIGHT 1ST WEB, RIGHT RING FINGER, RIGHT SMALL FINGER;  Surgeon: Cindee Salt, MD;  Location: [facility];  Service: Orthopedics;  Laterality: Right;   SPINE SURGERY       Social History:  reports that he quit smoking about 26 years ago. His smoking use included cigarettes. He has never used smokeless tobacco. He reports current alcohol use of about 9.0 standard drinks of alcohol per week. He reports that he does not use drugs.   Family History:  His family history includes Alcohol abuse in his mother; Arthritis in his paternal grandmother; Atrial fibrillation in his father; COPD in his father; Cancer in his paternal grandfather; Early death in his mother; Heart disease in his father; Hyperlipidemia in his father; Hypertension in his father; Lung cancer (age of onset: 56) in his mother. There is no history of Colon cancer, Esophageal cancer, Stomach cancer, or Rectal cancer.   Allergies Allergies  Allergen Reactions   Cephalexin Rash   Doxycycline Rash   Latex Hives and Rash     The patient is critically ill with multiple organ systems failure and requires high complexity decision making for assessment and support, frequent evaluation and titration of therapies, application of advanced monitoring technologies and extensive interpretation of multiple databases. Critical Care Time devoted to patient care services  described in this note independent of APP/resident time (if applicable)  is 31 minutes.   Virl Diamond MD [facility] Personal pager: See Amion If unanswered, please page CCM On-call: #[phone]

## 2023-04-28 NOTE — Progress Notes (Signed)
Daily Progress Note   Patient Name: Nathan Roberts       Date: 04/28/2023 DOB: 12-19-1960  Age: 62 y.o. MRN#: [id number] Attending Physician: Tomma Lightning, MD Primary Care Physician: Clayborne Dana, NP Admit Date: 04/29/2023 Length of Stay: 4 days  Reason for Consultation/Follow-up: Establishing goals of care  Subjective:   CC: Patient remains intubated and sedated on ventilator support. Following up regarding complex medical decision making.   Subjective:  EMR prior to presenting to bedside. Also discussed care with PCCM and RN for updates. Patient remains unresponsive on the ventilator off of sedation. Patient did not tolerate CPAP setting on vent yesterday afternoon, developed cardiac arrhythmia and needed to be put back on ventilator support settings.   Seeing patient today, remains intubated and unresponsive off of sedation medication.  No family present at bedside during visit.  Able to call patient's wife, Nathan Roberts, in the afternoon.  Updated her on patient's current medical status.  Expressed concern that patient is unresponsive despite being off of sedation.  Nathan Roberts inquiring how long patient can be on ventilator support.  Discussed concern when patient on ventilator over 14 days and need to consider other options for support.  Also expressed concern that with patient's multiple medical conditions, may become a question of if patient's body further deteriorates, he is showing as his body is tired.  Nathan Roberts inquiring what would happen if patient cannot tolerate being off ventilator, discussed aggressive medical pathway versus transition to comfort focused care.  Nathan Roberts acknowledges that she would not want patient to "suffer".  Spent time providing emotional support as able. With permission, again able to address idea of CODE STATUS.  Encouraged to [place] discussed with patient's other family including son and brother about change of CODE STATUS to DO NOT RESUSCITATE.  Expressed again  concerned that if patient was sick and if his heart were to stop, cardiac resuscitation would not allow for quality outcomes for patient.  Nathan Roberts acknowledges and noted she would discuss with other family members about patient's CODE STATUS.  Discussed that IR is planning for paracentesis and so may reach out to wife for consent.  All questions answered at that time.  Thanked patient's wife for allowing me to speak with her today.  Updated IDT after discussion with wife.   Objective:   Vital Signs:  BP (!) 142/93   Pulse 77   Temp 99.7 F (37.6 C)   Resp 17   Ht 5\' 8"  (1.727 m)   Wt 78.6 kg   SpO2 99%   BMI 26.35 kg/m   Physical Exam: General: intubated, chronically ill appearing  Eyes: scleral jaundice, slight drainage noted HENT: ET tube in place Cardiovascular: RRR Respiratory: intubated on ventilator support  Abdomen: distended Skin: multiple ecchymosis on UEs b/l Neuro: unresponsive off of sedation  Imaging: I personally reviewed recent imaging.   Assessment & Plan:   Assessment: Patient is a 62 year old male with a past medical history of alcohol related hepatic cirrhosis complicated by ascites refractory to multiple paracenteses and diuretics and esophageal varices and hepatic encephalopathy and arthritis who was admitted on 04/04/2023 for management of altered mental status with unusual behavior at a grocery store.  During admission he was noted to have elevated ammonia levels.  Patient initially admitted for management of hepatic encephalopathy though patient had worsening lethargy and ammonia overnight despite aggressive medical interventions and so required transfer to the ICU.  Patient vomited and ultimately required intubation for protection of his airway.  PCCM consulted and now managing care.  GI consulted for management of decompensated cirrhosis.  Palliative medicine team consulted to assist with complex medical decision making. Of note, patient seen by palliative  medicine team on 03/24/2023 during that admission.  Recommendations/Plan: # Complex medical decision making/goals of care:  -Patient unable to participate in complex medical decision due to current medical status.                 -Discussed care with patient's wife via phone as detailed above in HPI.  Again reviewed patient's overall medical condition and disease process.  Continuing appropriate medical interventions at this time in attempts to improve patient's mental status.Multiple family members involve din patient's care.  See: [facility] Statutes Chapter 90. Medicine and Allied Occupations  90-21.13. Informed consent to health care treatment or procedure. In brief summary, this statute outlines that the following persons, in order indicated, are authorized to consent to medical treatment on behalf of the patient who does not demonstrate capacity to do so: Legally assigned guardian appointed by the court > healthcare agent appointed by legal healthcare power of attorney > healthcare agent appointed by the patient > legal spouse > majority of the patient's reasonably available parents and children who are at least 69 years of age > individual who has an established relationship with the patient, who is acting in good faith on behalf of the patient, and who can reliably convey the patient's wishes > attending physician with confirmation by another physician of the patient's condition and the necessity for treatment (unless in urgent/emergent situation)                  -  Code Status: Full Code    -Encouraged Nathan Roberts to discuss with family change of code status to DNR as concern interventions such as cardiac resuscitation would not bring quality time of patient with his underlying medical conditions.   # Symptom management:  -As per PCCM   # Psychosocial Support:  -wife (separated though not divorced), brother   # Discharge Planning: To Be Determined  Discussed with: PCCM provider,  RN, patient's wife, IR  Thank you for allowing the palliative care team to participate in the care Dineen Kid.  Alvester Morin, DO Palliative Care Provider PMT # [phone]  If patient remains symptomatic despite maximum doses, please call PMT at [phone] between 0700 and 1900. Outside of these hours, please call attending, as PMT does not have night coverage.  This provider spent a total of 41 minutes providing patient's care.  Includes review of EMR, discussing care with other staff members involved in patient's medical care, obtaining relevant history and information from patient and/or patient's family, and personal review of imaging and lab work. Greater than 50% of the time was spent counseling and coordinating care related to the above assessment and plan.    *Please note that this is a verbal dictation therefore any spelling or grammatical errors are due to the "[employer]" system interpretation.

## 2023-04-28 NOTE — Progress Notes (Signed)
[facility] Progress Note  SUBJECTIVE:   Interval history: Nathan Roberts was seen and evaluated today at bedside. Resting in bed, intubated, not interactive with exam, did not respond to sternal rub. Per nursing, no sedation this AM. No family at bedside.   Presented 04/21/2023 with altered mental status and weakness. CT imaging of head negative for acute infarct or hemorrhage. 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 He has previously been recommended to have TIPS evaluation. Had EGD during previous hospitalization given concern for melena on 03/29/23 with findings of grade I esophageal varices, portal hypertensive gastropathy, normal appearing duodenum.   Per review of intake/output, no bowel movement since admission. Is on tube feeds, is receiving lactulose and rifaximin via OG tube. Had paracentesis on 03/28/23 with removal 6 liters fluid.   He follows with GI, Dr. Carrie Mew, through [facility]. Last seen in office on 04/17/23, at that time patient reported being adherent to lactulose, lasix. Not adherent to spironolactone or ciprofloxacin prophylaxis. He noted having re accumulation of ascites. Consideration was given to restarting propranolol 10 mg BID. "There are several potential barriers to his transplant candidacy including functional status, poor adherence to recommendations, and lack of social support". Underwent paracentesis on 04/20/23 with removal 13 liters (PMN count 7, no malignant cells identified). He was recommended to follow up with [facility] IR for potential TIPS, "patient currently declining TIPS at this time due to procedure risks".  Labs 04/17/23 showed hepatitis A Ab total (+), hepatitis A Ab IgM (-), hepatitis B surface Ab (+) indicating prior immunization.   Past Medical History:  Diagnosis Date   Allergy    Arthritis    Carpal tunnel syndrome    Diabetes mellitus without  complication (HCC)    Esophageal varices (HCC)    GERD (gastroesophageal reflux disease)    GI bleed    Hyperlipidemia    Hypertension    Pancreas divisum 03/22/2023   Portal hypertensive gastropathy (HCC)    Pre-diabetes    Past Surgical History:  Procedure Laterality Date   abscess     rectal area   CARPAL TUNNEL RELEASE     left   CARPAL TUNNEL RELEASE Right 07/07/2021   Procedure: RIGHT CARPAL TUNNEL RELEASE;  Surgeon: Cindee Salt, MD;  Location: [facility];  Service: Orthopedics;  Laterality: Right;   CERVICAL FUSION     COLONOSCOPY WITH PROPOFOL N/A 05/29/2022   Procedure: COLONOSCOPY WITH PROPOFOL;  Surgeon: Napoleon Form, MD;  Location: [facility];  Service: Gastroenterology;  Laterality: N/A;   ESOPHAGEAL BANDING  10/07/2020   Procedure: ESOPHAGEAL BANDING;  Surgeon: Napoleon Form, MD;  Location: [facility];  Service: Endoscopy;;   ESOPHAGOGASTRODUODENOSCOPY N/A 03/29/2023   Procedure: ESOPHAGOGASTRODUODENOSCOPY (EGD);  Surgeon: Lynann Bologna, DO;  Location: [facility];  Service: Gastroenterology;  Laterality: N/A;   ESOPHAGOGASTRODUODENOSCOPY (EGD) WITH PROPOFOL N/A 10/07/2020   Procedure: ESOPHAGOGASTRODUODENOSCOPY (EGD) WITH PROPOFOL;  Surgeon: Napoleon Form, MD;  Location: [facility];  Service: Endoscopy;  Laterality: N/A;   ESOPHAGOGASTRODUODENOSCOPY (EGD) WITH PROPOFOL N/A 01/18/2021   Procedure: ESOPHAGOGASTRODUODENOSCOPY (EGD) WITH PROPOFOL;  Surgeon: Napoleon Form, MD;  Location: [facility];  Service: Endoscopy;  Laterality: N/A;   ESOPHAGOGASTRODUODENOSCOPY (EGD) WITH PROPOFOL N/A 05/29/2022   Procedure: ESOPHAGOGASTRODUODENOSCOPY (EGD) WITH PROPOFOL;  Surgeon: Napoleon Form, MD;  Location: [facility];  Service: Gastroenterology;  Laterality: N/A;   FASCIECTOMY Left 02/10/2021   Procedure: FASCIECTOMY RING FINGER  AND SMALL FINGER OF LEFT HAND;  Surgeon: Cindee Salt, MD;  Location: [facility];   Service: Orthopedics;  Laterality: Left;   FASCIECTOMY Right 07/07/2021   Procedure: FASCIECTOMY RIGHT 1ST WEB, RIGHT RING FINGER, RIGHT SMALL FINGER;  Surgeon: Cindee Salt, MD;  Location: [facility];  Service: Orthopedics;  Laterality: Right;   SPINE SURGERY     Current Facility-Administered Medications  Medication Dose Route Frequency Provider Last Rate Last Admin   0.9 %  sodium chloride infusion   Intravenous Continuous Anthoney Harada, NP 100 mL/hr at 04/28/23 4098 Infusion Verify at 04/28/23 0808   albuterol (PROVENTIL) (2.5 MG/3ML) 0.083% nebulizer solution 2.5 mg  2.5 mg Nebulization Q2H PRN Kirby Crigler, Mir M, MD       Chlorhexidine Gluconate Cloth 2 % PADS 6 each  6 each Topical Daily Carollee Herter, DO   6 each at 04/27/23 1230   ciprofloxacin (CIPRO) tablet 500 mg  500 mg Per Tube Q breakfast Karie Fetch P, DO   500 mg at 04/28/23 0756   docusate (COLACE) 50 MG/5ML liquid 100 mg  100 mg Per Tube BID Duayne Cal, NP   100 mg at 04/28/23 1028   feeding supplement (PROSource TF20) liquid 60 mL  60 mL Per Tube BID Duayne Cal, NP   60 mL at 04/28/23 1029   feeding supplement (VITAL 1.5 CAL) liquid 1,000 mL  1,000 mL Per Tube Continuous Duayne Cal, NP 20 mL/hr at 04/28/23 0808 Infusion Verify at 04/28/23 0808   fentaNYL (SUBLIMAZE) injection 25 mcg  25 mcg Intravenous Q15 min PRN Duayne Cal, NP       fentaNYL (SUBLIMAZE) injection 25-100 mcg  25-100 mcg Intravenous Q30 min PRN Duayne Cal, NP   50 mcg at 04/27/23 2057   furosemide (LASIX) tablet 20 mg  20 mg Per Tube BID Duayne Cal, NP   20 mg at 04/28/23 1028   influenza vac split trivalent PF (FLULAVAL) injection 0.5 mL  0.5 mL Intramuscular Prior to discharge Oretha Milch, MD       insulin aspart (novoLOG) injection 0-15 Units  0-15 Units Subcutaneous Q4H Duayne Cal, NP   2 Units at 04/28/23 0858   labetalol (NORMODYNE) injection 10 mg  10 mg Intravenous Q10 min PRN Duayne Cal, NP   10 mg  at 04/27/23 1606   lactulose (CHRONULAC) 10 GM/15ML solution 30 g  30 g Per Tube Q2H Duayne Cal, NP   30 g at 04/28/23 1027   midazolam (VERSED) injection 1-2 mg  1-2 mg Intravenous Q1H PRN Duayne Cal, NP   2 mg at 04/27/23 2246   ondansetron (ZOFRAN) tablet 4 mg  4 mg Oral Q6H PRN Kirby Crigler, Mir M, MD       Or   ondansetron [facility]) injection 4 mg  4 mg Intravenous Q6H PRN Maryln Gottron, MD   4 mg at 04/26/23 1191   Oral care mouth rinse  15 mL Mouth Rinse Q2H Duayne Cal, NP   15 mL at 04/28/23 1042   Oral care mouth rinse  15 mL Mouth Rinse PRN Duayne Cal, NP       pantoprazole (PROTONIX) injection 40 mg  40 mg Intravenous Daily Duayne Cal, NP   40 mg at 04/28/23 1029   pneumococcal 20-valent conjugate vaccine (PREVNAR 20) injection 0.5 mL  0.5 mL Intramuscular Prior to discharge Oretha Milch, MD  polyethylene glycol (MIRALAX / GLYCOLAX) packet 17 g  17 g Per Tube Daily Duayne Cal, NP   17 g at 04/28/23 1029   propranolol (INDERAL) tablet 20 mg  20 mg Per Tube TID Karie Fetch P, DO   20 mg at 04/28/23 1036   rifaximin (XIFAXAN) tablet 550 mg  550 mg Per Tube BID Duayne Cal, NP   550 mg at 04/28/23 1028   spironolactone (ALDACTONE) tablet 25 mg  25 mg Per Tube Daily Duayne Cal, NP   25 mg at 04/28/23 1028   sucralfate (CARAFATE) 1 GM/10ML suspension 1 g  1 g Per Tube BID Duayne Cal, NP   1 g at 04/28/23 1027   Allergies as of 05-21-23 - Review Complete 21-May-2023  Allergen Reaction Noted   Cephalexin Rash 08/12/2017   Doxycycline Rash 10/06/2020   Latex Hives and Rash 11/01/2011   Review of Systems:  Review of Systems  Reason unable to perform ROS: Intubated, non-interactive with exam.    OBJECTIVE:   Temp:  [97.9 F (36.6 C)-99.9 F (37.7 C)] 99.7 F (37.6 C) (09/28 0900) Pulse Rate:  [67-114] 74 (09/28 1036) Resp:  [11-19] 17 (09/28 0900) BP: (105-179)/(62-102) 137/83 (09/28 1036) SpO2:  [99 %] 99 % (09/28 0900) FiO2  (%):  [40 %] 40 % (09/28 0753) Last BM Date : 21-May-2023 Physical Exam Constitutional:      Appearance: He is cachectic. He is ill-appearing.     Interventions: He is intubated.  HENT:     Head:     Comments: Temporal wasting. Cardiovascular:     Rate and Rhythm: Normal rate and regular rhythm.  Pulmonary:     Effort: No respiratory distress. He is intubated.     Breath sounds: Normal breath sounds.  Abdominal:     General: Bowel sounds are normal. There is distension.     Palpations: Abdomen is soft.     Comments: Positive fluid wave.  Musculoskeletal:     Right lower leg: No edema.     Left lower leg: No edema.  Skin:    General: Skin is warm and dry.     Labs: Recent Labs    04/26/23 0913 04/27/23 0319 04/28/23 0522  WBC 8.4 6.9 8.9  HGB 11.5* 10.3* 10.0*  HCT 35.5* 32.4* 31.5*  PLT 109* 107* 119*   BMET Recent Labs    04/26/23 0519 04/27/23 0319 04/28/23 0522  NA 132* 133* 136  K 4.6 4.8 3.9  CL 101 103 108  CO2 21* 19* 20*  GLUCOSE 98 128* 135*  BUN 23 32* 33*  CREATININE 0.85 1.21 0.88  CALCIUM 9.0 8.8* 8.4*   LFT Recent Labs    04/28/23 0522  PROT 6.4*  ALBUMIN 2.7*  AST 57*  ALT 24  ALKPHOS 90  BILITOT 3.2*   PT/INR Recent Labs    04/26/23 0913  LABPROT 19.6*  INR 1.6*   Diagnostic imaging: DG Abd 1 View  Result Date: 04/26/2023 CLINICAL DATA:  OG tube reposition EXAM: ABDOMEN - 1 VIEW COMPARISON:  04/26/2023 FINDINGS: Esophageal tube tip overlies the mid to distal stomach. Mild gas-filled transverse colon IMPRESSION: Esophageal tube tip overlies the mid to distal stomach. Electronically Signed   By: Jasmine Pang M.D.   On: 04/26/2023 23:18   DG Abd 1 View  Result Date: 04/26/2023 CLINICAL DATA:  OG tube placement EXAM: ABDOMEN - 1 VIEW COMPARISON:  04/26/2023 FINDINGS: Esophageal tube tip overlies distal stomach. Metallic density in  the left upper quadrant. IMPRESSION: Esophageal tube tip overlies distal stomach. Electronically  Signed   By: Jasmine Pang M.D.   On: 04/26/2023 23:17    IMPRESSION: Hepatic encephalopathy  EtOH cirrhosis decompensated by esophageal varices, treatment resistant ascites, hepatic encephalopathy             -Last EGD 03/29/23 with small varices              -Paracentesis 04/20/23 removal 13 L, PMN count 7, not adherent to cipro ppx             -Previously recommended to have outpatient TIPs evaluation, patient declining given procedural risks Normocytic anemia Thrombocytopenia   Coagulopathy   PLAN: -Continue lactulose (30 gm per OG every 2 hours) and rifaximin (550 mg per OG Q12Hr), add lactulose enemas today as no bowel movement    -Needs repeat diagnostic/therapeutic paracentesis to rule out SBP in setting of AMS, is on prophylactic ciprofloxacin daily, will order IR consult for paracentesis with fluid studies  -Continue lasix and aldactone, trend urine output -Check thiamine, folate  -Your ICU care, [facility] will follow   LOS: 4 days   Liliane Shi, [facility]

## 2023-04-29 ENCOUNTER — Inpatient Hospital Stay (HOSPITAL_COMMUNITY): Payer: 59

## 2023-04-29 DIAGNOSIS — J9600 Acute respiratory failure, unspecified whether with hypoxia or hypercapnia: Secondary | ICD-10-CM | POA: Diagnosis not present

## 2023-04-29 DIAGNOSIS — Z515 Encounter for palliative care: Secondary | ICD-10-CM | POA: Diagnosis not present

## 2023-04-29 DIAGNOSIS — K7682 Hepatic encephalopathy: Secondary | ICD-10-CM | POA: Diagnosis not present

## 2023-04-29 DIAGNOSIS — K7031 Alcoholic cirrhosis of liver with ascites: Secondary | ICD-10-CM | POA: Diagnosis not present

## 2023-04-29 LAB — CBC WITH DIFFERENTIAL/PLATELET
Abs Immature Granulocytes: 0.03 10*3/uL (ref 0.00–0.07)
Basophils Absolute: 0 10*3/uL (ref 0.0–0.1)
Basophils Relative: 1 %
Eosinophils Absolute: 0.4 10*3/uL (ref 0.0–0.5)
Eosinophils Relative: 5 %
HCT: 33.8 % — ABNORMAL LOW (ref 39.0–52.0)
Hemoglobin: 10.7 g/dL — ABNORMAL LOW (ref 13.0–17.0)
Immature Granulocytes: 0 %
Lymphocytes Relative: 16 %
Lymphs Abs: 1.3 10*3/uL (ref 0.7–4.0)
MCH: 32.3 pg (ref 26.0–34.0)
MCHC: 31.7 g/dL (ref 30.0–36.0)
MCV: 102.1 fL — ABNORMAL HIGH (ref 80.0–100.0)
Monocytes Absolute: 1.5 10*3/uL — ABNORMAL HIGH (ref 0.1–1.0)
Monocytes Relative: 19 %
Neutro Abs: 4.6 10*3/uL (ref 1.7–7.7)
Neutrophils Relative %: 59 %
Platelets: 99 10*3/uL — ABNORMAL LOW (ref 150–400)
RBC: 3.31 MIL/uL — ABNORMAL LOW (ref 4.22–5.81)
RDW: 15.6 % — ABNORMAL HIGH (ref 11.5–15.5)
WBC: 7.8 10*3/uL (ref 4.0–10.5)
nRBC: 0 % (ref 0.0–0.2)

## 2023-04-29 LAB — COMPREHENSIVE METABOLIC PANEL
ALT: 21 U/L (ref 0–44)
AST: 45 U/L — ABNORMAL HIGH (ref 15–41)
Albumin: 3.1 g/dL — ABNORMAL LOW (ref 3.5–5.0)
Alkaline Phosphatase: 74 U/L (ref 38–126)
Anion gap: 6 (ref 5–15)
BUN: 23 mg/dL (ref 8–23)
CO2: 21 mmol/L — ABNORMAL LOW (ref 22–32)
Calcium: 8.5 mg/dL — ABNORMAL LOW (ref 8.9–10.3)
Chloride: 111 mmol/L (ref 98–111)
Creatinine, Ser: 0.52 mg/dL — ABNORMAL LOW (ref 0.61–1.24)
GFR, Estimated: >60 mL/min (ref >60)
Glucose, Bld: 135 mg/dL — ABNORMAL HIGH (ref 70–99)
Potassium: 3.6 mmol/L (ref 3.5–5.1)
Sodium: 138 mmol/L (ref 135–145)
Total Bilirubin: 3.2 mg/dL — ABNORMAL HIGH (ref 0.3–1.2)
Total Protein: 5.9 g/dL — ABNORMAL LOW (ref 6.5–8.1)

## 2023-04-29 LAB — GRAM STAIN: Gram Stain: NONE SEEN

## 2023-04-29 LAB — GLUCOSE, CAPILLARY
Glucose-Capillary: 131 mg/dL — ABNORMAL HIGH (ref 70–99)
Glucose-Capillary: 107 mg/dL — ABNORMAL HIGH (ref 70–99)
Glucose-Capillary: 121 mg/dL — ABNORMAL HIGH (ref 70–99)
Glucose-Capillary: 137 mg/dL — ABNORMAL HIGH (ref 70–99)
Glucose-Capillary: 110 mg/dL — ABNORMAL HIGH (ref 70–99)

## 2023-04-29 LAB — AMMONIA
Ammonia: 142 umol/L — ABNORMAL HIGH (ref 9–35)
Ammonia: 89 umol/L — ABNORMAL HIGH (ref 9–35)

## 2023-04-29 LAB — PHOSPHORUS: Phosphorus: 1.9 mg/dL — ABNORMAL LOW (ref 2.5–4.6)

## 2023-04-29 LAB — MAGNESIUM: Magnesium: 2.3 mg/dL (ref 1.7–2.4)

## 2023-04-29 MED ORDER — LACTULOSE 10 GM/15ML PO SOLN
30.0000 g | Freq: Three times a day (TID) | ORAL | Status: DC
  Administered 2023-04-29 – 2023-04-30 (×2): 30 g
  Filled 2023-04-29: qty 45

## 2023-04-29 MED ORDER — ARTIFICIAL TEARS OPHTHALMIC OINT
TOPICAL_OINTMENT | OPHTHALMIC | Status: DC | PRN
  Filled 2023-04-29: qty 3.5

## 2023-04-29 MED ORDER — POTASSIUM PHOSPHATES 15 MMOLE/5ML IV SOLN
30.0000 mmol | Freq: Once | INTRAVENOUS | Status: AC
  Administered 2023-04-29: 30 mmol via INTRAVENOUS
  Filled 2023-04-29: qty 10

## 2023-04-29 NOTE — Progress Notes (Signed)
Daily Progress Note   Patient Name: Nathan Roberts       Date: 04/29/2023 DOB: 03-04-1961  Age: 62 y.o. MRN#: [id number] Attending Physician: Tomma Lightning, MD Primary Care Physician: Clayborne Dana, NP Admit Date: May 21, 2023 Length of Stay: 5 days  Reason for Consultation/Follow-up: Establishing goals of care  Subjective:   CC: Patient remains intubated on ventilator support. Following up regarding complex medical decision making.   Subjective:  EMR prior to presenting to bedside. At time of EMR review, patient has received prn versed 2mg  x 5 doses and 1mg  x1 dose. Discussed care with bedside RN and PCCM provider for updates. Planning to obtain CT head today. Patient's ammonia remains elevated at 142 despite lactulose management. Patient has been having bloody bowel movement. Patient had 9.2L removed during paracentesis yesterday.  Discussed with care team. Patient's son is at bedside. Patient's technical NOK though is patient's wife since they are not divorced. Technically medical decision making falls to patient's wife if patient is unable to voice for himself under [place] law since wife did not defer medical decision making. If patient's wife cannot be reached, then based on [place] law decision making would fall to patient's reasonable available parents and children over the age of 1. After this would be majority of patient's reasonably available siblings over 18. If wife cannot be reached, should be documented so know why reaching out to other [facility] regarding decision making.   Did try to call patient's wife today without answer.   Objective:   Vital Signs:  BP 111/69 (BP Location: Right Arm)   Pulse 64   Temp 97.9 F (36.6 C) (Bladder)   Resp 15   Ht 5\' 8"  (1.727 m)   Wt 78.6 kg   SpO2 100%   BMI 26.35 kg/m   Physical Exam: General: intubated, chronically ill appearing  Eyes: scleral jaundice, slight drainage noted HENT: ET tube in place Cardiovascular: RRR Respiratory:  intubated on ventilator support  Abdomen: distended Skin: multiple ecchymosis on UEs b/l Neuro: unresponsive off of sedation  Imaging: I personally reviewed recent imaging.   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  During admission he was noted to have elevated ammonia levels.  Patient initially admitted for management of hepatic encephalopathy though patient had worsening lethargy and ammonia overnight despite aggressive medical interventions and so required transfer to the ICU.  Patient vomited and ultimately required intubation for protection of his airway.  PCCM consulted and now managing care.  GI consulted for management of decompensated cirrhosis.  Palliative medicine team consulted to assist with complex medical decision making. Of note, patient seen by palliative medicine team on 03/24/2023 during that admission.  Recommendations/Plan: # Complex medical decision making/goals of care:  -Patient unable to participate in complex medical decision due to current medical status.                 -Have been discussing care with patient's wife though unable to reach her via phone today.  Wife had previously stated she was willing to be decision maker for patient and would involved patient's brothers in decision making as well since they all have a good relationship. Have been reviewing patient's overall medical condition  and disease process.  Continuing appropriate medical interventions at this time in attempts to improve patient's mental status.  See: [facility] Statutes Chapter 90. Medicine and Allied Occupations  90-21.13. Informed consent to health  care treatment or procedure. In brief summary, this statute outlines that the following persons, in order indicated, are authorized to consent to medical treatment on behalf of the patient who does not demonstrate capacity to do so: Legally assigned guardian appointed by the court > healthcare agent appointed by legal healthcare power of attorney > healthcare agent appointed by the patient > legal spouse > majority of the patient's reasonably available parents and children who are at least 63 years of age > majority of reasonably available siblings over 61 ears of age > individual who has an established relationship with the patient, who is acting in good faith on behalf of the patient, and who can reliably convey the patient's wishes > attending physician with confirmation by another physician of the patient's condition and the necessity for treatment (unless in urgent/emergent situation).                 -  Code Status: Full Code    -Have encouraged [name] to discuss with family change of code status to DNR as concern interventions such as cardiac resuscitation would not bring quality time of patient with his underlying medical conditions.   # Symptom management:  -As per PCCM   # Psychosocial Support:  -wife (separated though not divorced), brother, son   # Discharge Planning: To Be Determined  Discussed with: PCCM provider, RN  Thank you for allowing the palliative care team to participate in the care Dineen Kid.  Alvester Morin, DO Palliative Care Provider PMT # [phone]  If patient remains symptomatic despite maximum doses, please call PMT at [phone] between 0700 and 1900. Outside of these hours, please call attending, as PMT does not have night coverage.  This provider spent a total of 27 minutes providing patient's care.  Includes review of EMR, discussing care with other staff members involved in patient's medical care, obtaining relevant history and information from  patient and/or patient's family, and personal review of imaging and lab work. Greater than 50% of the time was spent counseling and coordinating care related to the above assessment and plan.    *Please note that this is a verbal dictation therefore any spelling or grammatical errors are due to the "[employer]" system interpretation.

## 2023-04-29 NOTE — Plan of Care (Signed)
  Problem: Clinical Measurements: Goal: Ability to maintain clinical measurements within normal limits will improve Outcome: Progressing Goal: Will remain free from infection Outcome: Progressing Goal: Diagnostic test results will improve Outcome: Progressing Goal: Cardiovascular complication will be avoided Outcome: Progressing   Problem: Nutrition: Goal: Adequate nutrition will be maintained Outcome: Progressing   Problem: Elimination: Goal: Will not experience complications related to bowel motility Outcome: Progressing Goal: Will not experience complications related to urinary retention Outcome: Progressing   Problem: Education: Goal: Knowledge of General Education information will improve Description: Including pain rating scale, medication(s)/side effects and non-pharmacologic comfort measures Outcome: Not Progressing   Problem: Health Behavior/Discharge Planning: Goal: Ability to manage health-related needs will improve Outcome: Not Progressing   Problem: Clinical Measurements: Goal: Respiratory complications will improve Outcome: Not Progressing   Problem: Activity: Goal: Risk for activity intolerance will decrease Outcome: Not Progressing   Problem: Coping: Goal: Level of anxiety will decrease Outcome: Not Progressing

## 2023-04-29 NOTE — Progress Notes (Signed)
[facility] Progress Note  SUBJECTIVE:   Interval history: Nathan Roberts was seen and evaluated today at bedside. Son, [name] (visiting from [place]), at bedside, had in-depth discussion about patient's current disease state, appears that prior to this visit he was unaware of his fathers liver disease. Discussed treatment plans currently, plans to continue lactulose/rifaximin to improve mentation, repeat CT imaging. He noted that long term, hoping to move patient to [place] to be near family members.   Patient remains intubated, minimal sedation, not responsive to noxious stimuli (sternal rub), unable to obtain review of systems. Nursing noted copious, non-bloody bowel movements, extending beyond fecal collection system.   Past Medical History:  Diagnosis Date   Allergy    Arthritis    Carpal tunnel syndrome    Diabetes mellitus without complication (HCC)    Esophageal varices (HCC)    GERD (gastroesophageal reflux disease)    GI bleed    Hyperlipidemia    Hypertension    Pancreas divisum 03/22/2023   Portal hypertensive gastropathy (HCC)    Pre-diabetes    Past Surgical History:  Procedure Laterality Date   abscess     rectal area   CARPAL TUNNEL RELEASE     left   CARPAL TUNNEL RELEASE Right 07/07/2021   Procedure: RIGHT CARPAL TUNNEL RELEASE;  Surgeon: Cindee Salt, MD;  Location: [facility];  Service: Orthopedics;  Laterality: Right;   CERVICAL FUSION     COLONOSCOPY WITH PROPOFOL N/A 05/29/2022   Procedure: COLONOSCOPY WITH PROPOFOL;  Surgeon: Napoleon Form, MD;  Location: [facility];  Service: Gastroenterology;  Laterality: N/A;   ESOPHAGEAL BANDING  10/07/2020   Procedure: ESOPHAGEAL BANDING;  Surgeon: Napoleon Form, MD;  Location: [facility];  Service: Endoscopy;;   ESOPHAGOGASTRODUODENOSCOPY N/A 03/29/2023   Procedure: ESOPHAGOGASTRODUODENOSCOPY (EGD);  Surgeon: Lynann Bologna, DO;  Location: [facility];  Service:  Gastroenterology;  Laterality: N/A;   ESOPHAGOGASTRODUODENOSCOPY (EGD) WITH PROPOFOL N/A 10/07/2020   Procedure: ESOPHAGOGASTRODUODENOSCOPY (EGD) WITH PROPOFOL;  Surgeon: Napoleon Form, MD;  Location: [facility];  Service: Endoscopy;  Laterality: N/A;   ESOPHAGOGASTRODUODENOSCOPY (EGD) WITH PROPOFOL N/A 01/18/2021   Procedure: ESOPHAGOGASTRODUODENOSCOPY (EGD) WITH PROPOFOL;  Surgeon: Napoleon Form, MD;  Location: [facility];  Service: Endoscopy;  Laterality: N/A;   ESOPHAGOGASTRODUODENOSCOPY (EGD) WITH PROPOFOL N/A 05/29/2022   Procedure: ESOPHAGOGASTRODUODENOSCOPY (EGD) WITH PROPOFOL;  Surgeon: Napoleon Form, MD;  Location: [facility];  Service: Gastroenterology;  Laterality: N/A;   FASCIECTOMY Left 02/10/2021   Procedure: FASCIECTOMY RING FINGER AND SMALL FINGER OF LEFT HAND;  Surgeon: Cindee Salt, MD;  Location: [facility];  Service: Orthopedics;  Laterality: Left;   FASCIECTOMY Right 07/07/2021   Procedure: FASCIECTOMY RIGHT 1ST WEB, RIGHT RING FINGER, RIGHT SMALL FINGER;  Surgeon: Cindee Salt, MD;  Location: [facility];  Service: Orthopedics;  Laterality: Right;   SPINE SURGERY     Current Facility-Administered Medications  Medication Dose Route Frequency Provider Last Rate Last Admin   0.9 %  sodium chloride infusion   Intravenous Continuous Anthoney Harada, NP 100 mL/hr at 04/29/23 0741 Infusion Verify at 04/29/23 0741   albuterol (PROVENTIL) (2.5 MG/3ML) 0.083% nebulizer solution 2.5 mg  2.5 mg Nebulization Q2H PRN Maryln Gottron, MD       Chlorhexidine Gluconate Cloth 2 % PADS 6 each  6 each Topical Daily Carollee Herter, DO   6 each at 04/28/23 1253   ciprofloxacin (CIPRO) tablet 500 mg  500 mg Per Tube Q breakfast Chestine Spore,  Virl Axe, DO   500 mg at 04/29/23 0804   feeding supplement (PROSource TF20) liquid 60 mL  60 mL Per Tube BID Duayne Cal, NP   60 mL at 04/29/23 0957   feeding supplement (VITAL 1.5 CAL) liquid 1,000 mL  1,000 mL Per  Tube Continuous Duayne Cal, NP 30 mL/hr at 04/29/23 0741 Infusion Verify at 04/29/23 0741   fentaNYL (SUBLIMAZE) injection 25 mcg  25 mcg Intravenous Q15 min PRN Duayne Cal, NP       fentaNYL (SUBLIMAZE) injection 25-100 mcg  25-100 mcg Intravenous Q30 min PRN Duayne Cal, NP   50 mcg at 04/27/23 2057   furosemide (LASIX) tablet 20 mg  20 mg Per Tube BID Duayne Cal, NP   20 mg at 04/29/23 0959   influenza vac split trivalent PF (FLULAVAL) injection 0.5 mL  0.5 mL Intramuscular Prior to discharge Oretha Milch, MD       insulin aspart (novoLOG) injection 0-15 Units  0-15 Units Subcutaneous Q4H Duayne Cal, NP   2 Units at 04/29/23 0400   labetalol (NORMODYNE) injection 10 mg  10 mg Intravenous Q10 min PRN Duayne Cal, NP   10 mg at 04/27/23 1606   lactulose (CHRONULAC) 10 GM/15ML solution 30 g  30 g Per Tube Q2H Duayne Cal, NP   30 g at 04/29/23 0956   ondansetron (ZOFRAN) tablet 4 mg  4 mg Oral Q6H PRN Maryln Gottron, MD       Or   ondansetron [facility]) injection 4 mg  4 mg Intravenous Q6H PRN Maryln Gottron, MD   4 mg at 04/26/23 1610   Oral care mouth rinse  15 mL Mouth Rinse Q2H Duayne Cal, NP   15 mL at 04/29/23 1001   Oral care mouth rinse  15 mL Mouth Rinse PRN Duayne Cal, NP       pantoprazole (PROTONIX) injection 40 mg  40 mg Intravenous Daily Duayne Cal, NP   40 mg at 04/29/23 1000   pneumococcal 20-valent conjugate vaccine (PREVNAR 20) injection 0.5 mL  0.5 mL Intramuscular Prior to discharge Oretha Milch, MD       potassium PHOSPHATE 30 mmol in dextrose 5 % 500 mL infusion  30 mmol Intravenous Once Len Childs T, RPH       rifaximin [facility]) tablet 550 mg  550 mg Per Tube BID Duayne Cal, NP   550 mg at 04/29/23 9604   spironolactone (ALDACTONE) tablet 25 mg  25 mg Per Tube Daily Duayne Cal, NP   25 mg at 04/29/23 0958   sucralfate (CARAFATE) 1 GM/10ML suspension 1 g  1 g Per Tube BID Duayne Cal, NP   1 g at  04/29/23 5409   Allergies as of 04/21/2023 - Review Complete 04/02/2023  Allergen Reaction Noted   Cephalexin Rash 08/12/2017   Doxycycline Rash 10/06/2020   Latex Hives and Rash 11/01/2011   Review of Systems:  Review of Systems  Reason unable to perform ROS: Not responsive during examination.    OBJECTIVE:   Temp:  [97.7 F (36.5 C)-100.2 F (37.9 C)] 97.7 F (36.5 C) (09/29 0900) Pulse Rate:  [60-78] 65 (09/29 0900) Resp:  [14-29] 14 (09/29 0900) BP: (89-129)/(48-94) 110/72 (09/29 0900) SpO2:  [94 %-100 %] 100 % (09/29 0900) FiO2 (%):  [40 %] 40 % (09/29 0823) Last BM Date : 04/29/23 Physical Exam Constitutional:  General: He is not in acute distress.    Appearance: He is underweight. He is ill-appearing.     Interventions: He is intubated.  Cardiovascular:     Rate and Rhythm: Normal rate and regular rhythm.  Pulmonary:     Effort: No respiratory distress. He is intubated.     Breath sounds: Normal breath sounds.     Comments: Intubated. Abdominal:     General: Bowel sounds are normal. There is distension.     Palpations: Abdomen is soft.     Comments: Liquid brown stool noted in rectal tube.  Musculoskeletal:     Right lower leg: No edema.     Left lower leg: No edema.  Skin:    General: Skin is warm and dry.     Labs: Recent Labs    04/27/23 0319 04/28/23 0522 04/29/23 0942  WBC 6.9 8.9 7.8  HGB 10.3* 10.0* 10.7*  HCT 32.4* 31.5* 33.8*  PLT 107* 119* 99*   BMET Recent Labs    04/27/23 0319 04/28/23 0522 04/29/23 0942  NA 133* 136 138  K 4.8 3.9 3.6  CL 103 108 111  CO2 19* 20* 21*  GLUCOSE 128* 135* 135*  BUN 32* 33* 23  CREATININE 1.21 0.88 0.52*  CALCIUM 8.8* 8.4* 8.5*   LFT Recent Labs    04/29/23 0942  PROT 5.9*  ALBUMIN 3.1*  AST 45*  ALT 21  ALKPHOS 74  BILITOT 3.2*   PT/INR Recent Labs    04/28/23 1154  LABPROT 21.5*  INR 1.8*   Diagnostic imaging: US Paracentesis  Result Date: 04/28/2023 INDICATION:  Ascites, concern for SBP. Request for diagnostic and therapeutic paracentesis with no maximum. EXAM: ULTRASOUND GUIDED  PARACENTESIS MEDICATIONS: 6 cc 1% lidocaine COMPLICATIONS: None immediate. PROCEDURE: Informed written consent was obtained from the patient after a discussion of the risks, benefits and alternatives to treatment. A timeout was performed prior to the initiation of the procedure. Initial ultrasound scanning demonstrates a large amount of ascites within the right lower abdominal quadrant. The right lower abdomen was prepped and draped in the usual sterile fashion. 1% lidocaine was used for local anesthesia. Following this, a 19 gauge, 10-cm, Yueh catheter was introduced. An ultrasound image was saved for documentation purposes. The paracentesis was performed. The catheter was removed and a dressing was applied. The patient tolerated the procedure well without immediate post procedural complication. Patient received post-procedure intravenous albumin; see nursing notes for details. FINDINGS: A total of approximately 9.2 L of golden yellow fluid was removed. Samples were sent to the laboratory as requested by the clinical team. IMPRESSION: Successful ultrasound-guided paracentesis yielding 9.2 liters of peritoneal fluid. Procedure performed by Mina Marble, PA-C Electronically Signed   By: Malachy Moan M.D.   On: 04/28/2023 15:38    IMPRESSION: Altered mentation, hepatic encephalopathy versus other  -Paracentesis 04/28/23 negative for SBP, UA 9/25 bland  -Pending repeat head CT today EtOH cirrhosis decompensated by esophageal varices, treatment resistant ascites, hepatic encephalopathy             -Last EGD 03/29/23 with small varices              -Paracentesis 04/20/23 removal 13 L, PMN count 7, not adherent to cipro ppx ` -Paracentesis 04/28/23 removal 9.2 L, PMN count 7             -Previously recommended to have outpatient TIPs evaluation, patient declined given procedural risks  -Not  transplant candidate at this time, "there are several  potential barriers to his transplant candidacy including functional status, poor adherence to recommendations, and lack of social support"  Normocytic anemia Thrombocytopenia   Coagulopathy   PLAN: -Copious bowel movements today, will slow lactulose per OG to every 8 hours, monitor bowel movements and mentation -Continue Rifaximin 550 mg per OG Q12Hr -Continue spironolactone 25 mg OG daily and furosemide 20 mg OG BID  -On tube feeds -Follow up head CT results  -[facility] will follow, Dr. Ewing Schlein to see 04/30/23   LOS: 5 days   Liliane Shi, DO [facility]

## 2023-04-29 NOTE — Progress Notes (Addendum)
his head around, responds to voice but does not follow commands  I reviewed nursing notes, Consultant notes, last 24 h vitals and pain scores, last 48 h intake  and output, last 24 h labs and trends, and last 24 h imaging results.  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 -Continue lactulose and rifaximin -Continue to monitor ammonia level, still significantly elevated -On lactulose every 2 -Head CT ordered-pending   Alcoholic cirrhosis with meld of 18, MELD sodium of 22 Refractory ascites -S/p paracentesis for 9.2 L -No organism and fluid -Nucleated cell 105  Portal hypertension -Will continue Lasix, spironolactone as BP tolerates -Not a transplant candidate Appreciate GI recommendations -Appreciate palliative care involvement  Acute kidney injury -Maintain renal perfusion -Continue spironolactone  Hypertension -Continue to monitor -Discontinued propranolol because of hypotension  Diabetes -Continue SSI  Hyponatremia -Related to cirrhosis  Updated patient's son [name] yesterday 9/29 Called spouse telephone #[phone]  10/25-no answer  Discussed sedation with his nurse -Limit sedating medications as best as we can -Order for Versed discontinued -Still has as needed fentanyl in place -Will reinitiate sedatives as needed Hold MiraLAX  Goals of care discussions ongoing  Best Practice (right click and "Reselect all SmartList Selections" daily)   Diet/type: tubefeeds DVT prophylaxis: not indicated GI prophylaxis: PPI Lines: N/A Foley:  N/A Code Status:  full code Last date of multidisciplinary  goals of care discussion [updated patient's son [name] 9/28]  Labs   CBC: Recent Labs  Lab 05-12-2023 1600 04/25/23 0542 04/26/23 0913 04/27/23 0319 04/28/23 0522  WBC 4.7 4.7 8.4 6.9 8.9  NEUTROABS  --   --  6.0 3.9  --   HGB 10.0* 10.1* 11.5* 10.3* 10.0*  HCT 31.1* 30.8* 35.5* 32.4* 31.5*  MCV 99.4 97.8 99.7 100.9* 100.0  PLT 101* 91* 109* 107* 119*    Basic Metabolic Panel: Recent Labs  Lab May 12, 2023 1600 04/25/23 0542 04/26/23 0519 04/27/23 0319 04/27/23 1636 04/28/23 0522 04/28/23 1647 04/29/23 0511  NA 131* 129* 132* 133*  --  136  --   --   K 4.1 4.0 4.6 4.8  --  3.9  --   --   CL 99 100 101 103  --  108  --   --   CO2 24 22 21* 19*  --  20*  --   --   GLUCOSE 126* 97 98 128*  --  135*  --   --   BUN 18 18 23  32*  --  33*  --   --   CREATININE 0.65 0.62 0.85 1.21  --  0.88  --   --   CALCIUM 8.5* 8.5* 9.0 8.8*  --  8.4*  --   --   MG  --   --   --  2.2 2.4 2.2 2.2 2.3  PHOS  --   --   --  6.0* 4.7* 3.0 2.5 1.9*   GFR: Estimated Creatinine Clearance: 82 mL/min (by C-G formula based on SCr of 0.88 mg/dL). Recent Labs  Lab 04/25/23 0542 04/26/23 0913 04/27/23 0319 04/28/23  his head around, responds to voice but does not follow commands  I reviewed nursing notes, Consultant notes, last 24 h vitals and pain scores, last 48 h intake  and output, last 24 h labs and trends, and last 24 h imaging results.  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 -Continue lactulose and rifaximin -Continue to monitor ammonia level, still significantly elevated -On lactulose every 2 -Head CT ordered-pending   Alcoholic cirrhosis with meld of 18, MELD sodium of 22 Refractory ascites -S/p paracentesis for 9.2 L -No organism and fluid -Nucleated cell 105  Portal hypertension -Will continue Lasix, spironolactone as BP tolerates -Not a transplant candidate Appreciate GI recommendations -Appreciate palliative care involvement  Acute kidney injury -Maintain renal perfusion -Continue spironolactone  Hypertension -Continue to monitor -Discontinued propranolol because of hypotension  Diabetes -Continue SSI  Hyponatremia -Related to cirrhosis  Updated patient's son [name] yesterday 9/29 Called spouse telephone #[phone]  10/25-no answer  Discussed sedation with his nurse -Limit sedating medications as best as we can -Order for Versed discontinued -Still has as needed fentanyl in place -Will reinitiate sedatives as needed Hold MiraLAX  Goals of care discussions ongoing  Best Practice (right click and "Reselect all SmartList Selections" daily)   Diet/type: tubefeeds DVT prophylaxis: not indicated GI prophylaxis: PPI Lines: N/A Foley:  N/A Code Status:  full code Last date of multidisciplinary  goals of care discussion [updated patient's son [name] 9/28]  Labs   CBC: Recent Labs  Lab 05-12-2023 1600 04/25/23 0542 04/26/23 0913 04/27/23 0319 04/28/23 0522  WBC 4.7 4.7 8.4 6.9 8.9  NEUTROABS  --   --  6.0 3.9  --   HGB 10.0* 10.1* 11.5* 10.3* 10.0*  HCT 31.1* 30.8* 35.5* 32.4* 31.5*  MCV 99.4 97.8 99.7 100.9* 100.0  PLT 101* 91* 109* 107* 119*    Basic Metabolic Panel: Recent Labs  Lab May 12, 2023 1600 04/25/23 0542 04/26/23 0519 04/27/23 0319 04/27/23 1636 04/28/23 0522 04/28/23 1647 04/29/23 0511  NA 131* 129* 132* 133*  --  136  --   --   K 4.1 4.0 4.6 4.8  --  3.9  --   --   CL 99 100 101 103  --  108  --   --   CO2 24 22 21* 19*  --  20*  --   --   GLUCOSE 126* 97 98 128*  --  135*  --   --   BUN 18 18 23  32*  --  33*  --   --   CREATININE 0.65 0.62 0.85 1.21  --  0.88  --   --   CALCIUM 8.5* 8.5* 9.0 8.8*  --  8.4*  --   --   MG  --   --   --  2.2 2.4 2.2 2.2 2.3  PHOS  --   --   --  6.0* 4.7* 3.0 2.5 1.9*   GFR: Estimated Creatinine Clearance: 82 mL/min (by C-G formula based on SCr of 0.88 mg/dL). Recent Labs  Lab 04/25/23 0542 04/26/23 0913 04/27/23 0319 04/28/23  NAME:  Nathan Roberts, MRN:  [id number], DOB:  [birth year], LOS: 5 ADMISSION DATE:  04/23/2023, CONSULTATION DATE: 9/26 REFERRING MD: Dr. Imogene Burn, CHIEF COMPLAINT: Hepatic encephalopathy  History of Present Illness:  Patient is encephalopathic and/or intubated; therefore, history has been obtained from chart review.  62 year old male with past medical history as below, which is significant for alcohol-related hepatic cirrhosis, ascites refractory to multiple paracentesis and diuretics, medical noncompliance, esophageal varices, and hepatic encephalopathy which had been well-controlled with medications.  Recently admitted to [facility] in late August for ascites.  He had a endoscopy at that time showing grade 1 esophageal varices.  He has been followed by both [facility], and [facility]. Last note from [facility] recommended Lasix and possibly spiro for volume. Cipro 500 daily for SBP ppx, and sodium restricted diet. He was felt to be a poor candidate for transplant due to social issues, history of non-compliance, and functional status. TIPs, however, was not considered to be off the table as his HE was able to be controlled with medications. Patient has declined TIPS in the past. Most recent paracentesis 9/20 during which 13L was removed.   He presented to [facility] ED 9/24 via EMS after he was noted to exhibit unusual behavior at the grocery store. He was oriented to self in the ED, but was otherwise confused. Ammonia level was noted to be high in correlation to his history of hepatic encephalopathy. He received a dose of lactulose in the ED and was admitted to the hospitalists for treatment of HE with lactulose and decompensated cirrhosis with diuretics and SBP ppx. However, in the overnight hours 9/25-26 he became increasingly lethargic and ammonia level climbed despite treatment. He was transferred to the ICU minimally responsive with poor protection of his airway and ultimately vomited. PCCM consulted  for consideration of intubation.   Pertinent  Medical History   has a past medical history of Allergy, Arthritis, Carpal tunnel syndrome, Diabetes mellitus without complication (HCC), Esophageal varices (HCC), GERD (gastroesophageal reflux disease), GI bleed, Hyperlipidemia, Hypertension, Pancreas divisum (03/22/2023), Portal hypertensive gastropathy (HCC), and Pre-diabetes.   Significant Hospital Events: Including procedures, antibiotic start and stop dates in addition to other pertinent events   9/24 presented with confusion. Admitted for treatment of HE 9/26 tx to ICU for lethargy, vomiting, aspiration concern. Intubated.  9/28 failed weaning 9/28-over 9 L paracentesis-sent for analysis  Interim History / Subjective:  Remains intubated Not following commands but does appear to respond to voice No significant overnight events, did have transient hypotension last evening  Objective   Blood pressure 112/60, pulse 65, temperature 98.1 F (36.7 C), resp. rate 19, height 5\' 8"  (1.727 m), weight 78.6 kg, SpO2 100%.    Vent Mode: PRVC FiO2 (%):  [40 %] 40 % Set Rate:  [12 bmp] 12 bmp Vt Set:  [550 mL] 550 mL PEEP:  [5 cmH20] 5 cmH20 Pressure Support:  [8 cmH20] 8 cmH20 Plateau Pressure:  [8 cmH20-13 cmH20] 13 cmH20   Intake/Output Summary (Last 24 hours) at 04/29/2023 0800 Last data filed at 04/29/2023 0741 Gross per 24 hour  Intake 4406.32 ml  Output 1100 ml  Net 3306.32 ml   Filed Weights   04/04/2023 1549 04/26/23 0900  Weight: 79.2 kg 78.6 kg    Examination: General: Middle-age, does not appear to be in distress HENT: Moist oral mucosa, endotracheal tube in place Lungs: Clear breath sounds protuberans, nontender Cardiovascular: S1-S2 appreciated Abdomen: Protuberant, nontender Extremities: No edema Neuro: Moves  NAME:  Nathan Roberts, MRN:  [id number], DOB:  [birth year], LOS: 5 ADMISSION DATE:  04/23/2023, CONSULTATION DATE: 9/26 REFERRING MD: Dr. Imogene Burn, CHIEF COMPLAINT: Hepatic encephalopathy  History of Present Illness:  Patient is encephalopathic and/or intubated; therefore, history has been obtained from chart review.  62 year old male with past medical history as below, which is significant for alcohol-related hepatic cirrhosis, ascites refractory to multiple paracentesis and diuretics, medical noncompliance, esophageal varices, and hepatic encephalopathy which had been well-controlled with medications.  Recently admitted to [facility] in late August for ascites.  He had a endoscopy at that time showing grade 1 esophageal varices.  He has been followed by both [facility], and [facility]. Last note from [facility] recommended Lasix and possibly spiro for volume. Cipro 500 daily for SBP ppx, and sodium restricted diet. He was felt to be a poor candidate for transplant due to social issues, history of non-compliance, and functional status. TIPs, however, was not considered to be off the table as his HE was able to be controlled with medications. Patient has declined TIPS in the past. Most recent paracentesis 9/20 during which 13L was removed.   He presented to [facility] ED 9/24 via EMS after he was noted to exhibit unusual behavior at the grocery store. He was oriented to self in the ED, but was otherwise confused. Ammonia level was noted to be high in correlation to his history of hepatic encephalopathy. He received a dose of lactulose in the ED and was admitted to the hospitalists for treatment of HE with lactulose and decompensated cirrhosis with diuretics and SBP ppx. However, in the overnight hours 9/25-26 he became increasingly lethargic and ammonia level climbed despite treatment. He was transferred to the ICU minimally responsive with poor protection of his airway and ultimately vomited. PCCM consulted  for consideration of intubation.   Pertinent  Medical History   has a past medical history of Allergy, Arthritis, Carpal tunnel syndrome, Diabetes mellitus without complication (HCC), Esophageal varices (HCC), GERD (gastroesophageal reflux disease), GI bleed, Hyperlipidemia, Hypertension, Pancreas divisum (03/22/2023), Portal hypertensive gastropathy (HCC), and Pre-diabetes.   Significant Hospital Events: Including procedures, antibiotic start and stop dates in addition to other pertinent events   9/24 presented with confusion. Admitted for treatment of HE 9/26 tx to ICU for lethargy, vomiting, aspiration concern. Intubated.  9/28 failed weaning 9/28-over 9 L paracentesis-sent for analysis  Interim History / Subjective:  Remains intubated Not following commands but does appear to respond to voice No significant overnight events, did have transient hypotension last evening  Objective   Blood pressure 112/60, pulse 65, temperature 98.1 F (36.7 C), resp. rate 19, height 5\' 8"  (1.727 m), weight 78.6 kg, SpO2 100%.    Vent Mode: PRVC FiO2 (%):  [40 %] 40 % Set Rate:  [12 bmp] 12 bmp Vt Set:  [550 mL] 550 mL PEEP:  [5 cmH20] 5 cmH20 Pressure Support:  [8 cmH20] 8 cmH20 Plateau Pressure:  [8 cmH20-13 cmH20] 13 cmH20   Intake/Output Summary (Last 24 hours) at 04/29/2023 0800 Last data filed at 04/29/2023 0741 Gross per 24 hour  Intake 4406.32 ml  Output 1100 ml  Net 3306.32 ml   Filed Weights   04/04/2023 1549 04/26/23 0900  Weight: 79.2 kg 78.6 kg    Examination: General: Middle-age, does not appear to be in distress HENT: Moist oral mucosa, endotracheal tube in place Lungs: Clear breath sounds protuberans, nontender Cardiovascular: S1-S2 appreciated Abdomen: Protuberant, nontender Extremities: No edema Neuro: Moves

## 2023-04-29 NOTE — Progress Notes (Signed)
Electrolyte Replacement  K 3.6 Phos 1.9  Plan: Kphos 30 mM per [facility] protocol  Herby Abraham, Pharm.D Use secure chat for questions 04/29/2023 10:23 AM

## 2023-04-30 DIAGNOSIS — J9601 Acute respiratory failure with hypoxia: Secondary | ICD-10-CM

## 2023-04-30 DIAGNOSIS — J9600 Acute respiratory failure, unspecified whether with hypoxia or hypercapnia: Secondary | ICD-10-CM | POA: Diagnosis not present

## 2023-04-30 DIAGNOSIS — Z515 Encounter for palliative care: Secondary | ICD-10-CM | POA: Diagnosis not present

## 2023-04-30 DIAGNOSIS — K7031 Alcoholic cirrhosis of liver with ascites: Secondary | ICD-10-CM | POA: Diagnosis not present

## 2023-04-30 DIAGNOSIS — K7682 Hepatic encephalopathy: Secondary | ICD-10-CM | POA: Diagnosis not present

## 2023-04-30 LAB — GLUCOSE, CAPILLARY
Glucose-Capillary: 115 mg/dL — ABNORMAL HIGH (ref 70–99)
Glucose-Capillary: 121 mg/dL — ABNORMAL HIGH (ref 70–99)
Glucose-Capillary: 124 mg/dL — ABNORMAL HIGH (ref 70–99)
Glucose-Capillary: 130 mg/dL — ABNORMAL HIGH (ref 70–99)
Glucose-Capillary: 154 mg/dL — ABNORMAL HIGH (ref 70–99)
Glucose-Capillary: 157 mg/dL — ABNORMAL HIGH (ref 70–99)
Glucose-Capillary: 93 mg/dL (ref 70–99)

## 2023-04-30 LAB — CBC
HCT: 32.9 % — ABNORMAL LOW (ref 39.0–52.0)
Hemoglobin: 10.4 g/dL — ABNORMAL LOW (ref 13.0–17.0)
MCH: 32.4 pg (ref 26.0–34.0)
MCHC: 31.6 g/dL (ref 30.0–36.0)
MCV: 102.5 fL — ABNORMAL HIGH (ref 80.0–100.0)
Platelets: 106 10*3/uL — ABNORMAL LOW (ref 150–400)
RBC: 3.21 MIL/uL — ABNORMAL LOW (ref 4.22–5.81)
RDW: 15.7 % — ABNORMAL HIGH (ref 11.5–15.5)
WBC: 8.4 10*3/uL (ref 4.0–10.5)
nRBC: 0 % (ref 0.0–0.2)

## 2023-04-30 LAB — BASIC METABOLIC PANEL
Anion gap: 5 (ref 5–15)
BUN: 22 mg/dL (ref 8–23)
CO2: 21 mmol/L — ABNORMAL LOW (ref 22–32)
Calcium: 8.2 mg/dL — ABNORMAL LOW (ref 8.9–10.3)
Chloride: 114 mmol/L — ABNORMAL HIGH (ref 98–111)
Creatinine, Ser: 0.39 mg/dL — ABNORMAL LOW (ref 0.61–1.24)
GFR, Estimated: >60 mL/min (ref >60)
Glucose, Bld: 127 mg/dL — ABNORMAL HIGH (ref 70–99)
Potassium: 3.9 mmol/L (ref 3.5–5.1)
Sodium: 140 mmol/L (ref 135–145)

## 2023-04-30 LAB — PHOSPHORUS: Phosphorus: 2.6 mg/dL (ref 2.5–4.6)

## 2023-04-30 LAB — AMMONIA: Ammonia: 132 umol/L — ABNORMAL HIGH (ref 9–35)

## 2023-04-30 LAB — PATHOLOGIST SMEAR REVIEW

## 2023-04-30 MED ORDER — BANATROL TF EN LIQD
60.0000 mL | Freq: Two times a day (BID) | ENTERAL | Status: DC
  Administered 2023-04-30 – 2023-05-03 (×6): 60 mL
  Filled 2023-04-30 (×6): qty 60

## 2023-04-30 MED ORDER — DEXMEDETOMIDINE HCL IN NACL 200 MCG/50ML IV SOLN
0.0000 ug/kg/h | INTRAVENOUS | Status: DC
  Administered 2023-04-30: 0.4 ug/kg/h via INTRAVENOUS
  Filled 2023-04-30 (×2): qty 50

## 2023-04-30 MED ORDER — SODIUM CHLORIDE 0.9 % IV SOLN: INTRAVENOUS | Status: DC

## 2023-04-30 MED ORDER — LACTULOSE 10 GM/15ML PO SOLN: 30.0000 g | Freq: Four times a day (QID) | ORAL | Status: DC

## 2023-04-30 MED ORDER — LACTULOSE 10 GM/15ML PO SOLN
30.0000 g | Freq: Three times a day (TID) | ORAL | Status: DC
  Administered 2023-04-30 – 2023-05-02 (×7): 30 g
  Filled 2023-04-30 (×7): qty 45

## 2023-04-30 NOTE — Progress Notes (Signed)
Nathan Roberts 1:38 PM  Subjective: Patient seen and examined and case discussed with [name] extensively and all his questions were answered in his hospital computer chart reviewed and it looks like his primary [facility] right [facility] but I cannot tell if he has seen the transplant team there and what their opinion is and currently other than encephalopathy there is no signs of infection GI bleeding or significant ascites and his lactulose was decreased yesterday  Objective: Vital signs stable afebrile no acute distress labs reviewed patient not waking up at all for me or his brother abdomen is soft nontender possibly just a little ascites ammonia level significantly increased lower yesterday only 3 bowel movements noted on the computer each day at CT okay  Assessment: Cirrhosis and encephalopathy  Plan: Will increase lactulose and will check on tomorrow and suggested that brother discuss his possible transplant status with either his primary gastroenterologist or the palliative care doctors who according to him seem to no more than I do or possibly even social worker can help him with his questions and please call me sooner if I can be of any help from a GI standpoint  [facility]  office [phone] After 5PM or if no answer call [phone]

## 2023-04-30 NOTE — Progress Notes (Signed)
Daily Progress Note   Patient Name: Nathan Roberts       Date: 04/30/2023 DOB: 03/13/1961  Age: 62 y.o. MRN#: [id number] Attending Physician: Nathan Milch, MD Primary Care Physician: Nathan Dana, NP Admit Date: 04/18/2023 Length of Stay: 6 days  Reason for Consultation/Follow-up: Establishing goals of care  Subjective:   CC: Patient remains intubated on ventilator support. Following up regarding complex medical decision making.   Subjective:  EMR prior to presenting to bedside.Patient's  AM continues to fluctuate though remaining elevated today at 132. Patient receiving prn fentanyl for pain management; not on continuous sedation as minimizing this per PCCM management. Dicussed care with RN And respiratory therapy. Patient not following commands appropriately. Does respond to painful stimuli. RN noted patient's brother at bedside.   Did to bedside to meet with patient's half brother, Nathan Roberts.  Nathan Roberts is listed on patient's next of kin contact information so able to engage in conversation with him.  Introduced myself and the role of the palliative medicine team in patient's care.  Nathan Roberts was able to clarify all of patient's relations which were appropriately updated in his chart/contacts.  Dad noted that he is a employer and so wanted to discuss decision making for the patient.  Discussed based on [place] law since patient is still married, patient's wife Nathan Roberts is technical next of kin.  If unable to reasonably reach [name], noted majority of patients reasonably available parents and children over the age of 12 would be the next in line for medical decision making.  Dad noted that patient's parents are deceased.  Patient only has 1 son, Nathan Roberts.  Discussed Nathan Roberts would be decision-maker if patient's wife, Nathan Roberts, is unable to be recently reached.  Then discussed in [place] law hierarchy the decision if Nathan Roberts cannot be reasonably reached as the majority of patient's siblings.  Nathan Roberts noted  that he is 1 of 2 brothers for the patient.  Discussed that at any point Nathan Roberts wants to defer medical decision making to next family member, she is welcome to do so though initially when speaking to her on Friday and Saturday she still wanted to be involved in medical decision making for her husband's search their still technically married. Nathan Roberts acknowledged this.   Then spent extensive time discussing patient's overall medical condition.  Discussed severity of patient's underlying medical illness which is related to his decompensated alcoholic cirrhosis complicated by esophageal varices, treatment resistant ascites, and hepatic encephalopathy.  Discussed how patient was following at [facility] for his medical management of his liver disease.  Peers that at most recent visit with gastroenterologist at [facility] on 9/17, patient was noted to not be a transplant candidate due to several potential barriers in his transplant candidacy including his functional status, poor adherence to recommendations, and lack of social support.  Patient had reported to provider at that time not taking his ciprofloxacin prophylaxis or his beta-blocker as was recommended.  Discussed unfortunately patient has had multiple hospitalizations and recent months due to complications from his underlying liver cirrhosis.  Noted that documentation as per [facility] at that time stated " if synthetic function improves, TIPS procedure may be a consideration".  Explained to brother that patient is acutely ill and currently working to manage acute symptoms from cirrhosis.  Noted am not a gastroenterologist and so would be determined by a GI specialist regarding further management with TIPS procedure if patient improves enough to be able to proceed with this.  RN also  informed at bedside that as per notes from GI previously, patient had refused TIPS procedure at 1 point due to procedural risk.  Again discussed while unable to inquire with  patient about it now though need to deal with acute medical illnesses.  Discussed concern regarding patient's fluctuating ammonia level which has been higher today.  Also expressed concern about patient's inability to purposely respond at this time.  Reviewed imaging noting CT head showing chronic vessel changes, no signs of bleeding.  Discussed patient will hopefully have mentation improvement if ammonia level decreases, though it has been fluctuating and is increased today.  Discussed patient will need to be able to purposefully interact, maintain appropriate respiratory rate, and protect his airway to be able to be appropriately extubated.  This is 1 medical pathway for care moving forward.  Continuing on this pathway appropriately.  With permission, also able to discuss worry about patient's underlying liver cirrhosis is for which she is not a liver transplant candidate as per EMR review which is the fix for this medical condition.  Expressed concern about patient's multiple hospitalizations and that when patients become encephalopathic with liver failure, could return to the same situation requiring intubation.  Unfortunately can be a difficult cycle in patients with liver cirrhosis.  I expressed concern that liver cirrhosis patient's are very ill and at times can decompensate.  Should patient acutely decompensate to the point that his heart physically stops, concerned cardiac resuscitation when chest compression would not lead to quality of life outcomes with patient already being on so much medical support.  Noted have attempted to discuss this with patient's wife since she is technically next of kin and medical decision maker.  Spent time reviewing patient's medications, lab work, and imaging from EMR to answer detailed questions regarding patient's medical care.  Encouraged that family, particularly patient's wife if available or patient's son continue discussions with providers regarding patient's medical  illness.  Spent time answering questions as able.  Recommended brother follow-up with gastroenterology regarding specifics of further discussions for liver cirrhosis since they are the experts of this.  Discussed intensivist/pulmonologist  also involved in patient's care.  Noted palliative medicine team would continue to follow along to provide support as able.  All questions answered at that time.    Attempted to call patient's wife though able to reach her.  Appropriate next of kin would be patient's son, Nathan Roberts.  Had already noted he would reach out to Nathan Roberts to discuss care so Nathan Roberts could receive updates regarding patient's management.  Updated PCCM team after discussion with patient's brother, Nathan Roberts.   Objective:   Vital Signs:  BP 108/65   Pulse 68   Temp (!) 97.4 F (36.3 C) (Axillary)   Resp (!) 8   Ht 5\' 8"  (1.727 m)   Wt 74.7 kg   SpO2 92%   BMI 25.04 kg/m   Physical Exam: General: intubated, chronically ill appearing  Eyes: scleral jaundice, slight drainage noted HENT: ET tube in place Cardiovascular: RRR Respiratory: intubated on ventilator support  Abdomen: distended Skin: multiple ecchymosis on UEs b/l Neuro: unresponsive off of sedation  Imaging: I personally reviewed recent imaging.   Assessment & Plan:   Assessment: Patient is a 62 year old male with a past medical history of alcohol related hepatic cirrhosis complicated by ascites refractory to multiple paracenteses and diuretics and esophageal varices and hepatic encephalopathy and arthritis who was admitted on 26-Apr-2023 for management of altered mental status with unusual behavior at a grocery store.  During admission he was noted to have elevated ammonia levels.  Patient initially admitted for management of hepatic encephalopathy though patient had worsening lethargy and ammonia overnight despite aggressive medical interventions and so required transfer to the ICU.  Patient vomited and ultimately required  intubation for protection of his airway.  PCCM consulted and now managing care.  GI consulted for management of decompensated cirrhosis.  Palliative medicine team consulted to assist with complex medical decision making. Of note, patient seen by palliative medicine team on 03/24/2023 during that admission.  Recommendations/Plan: # Complex medical decision making/goals of care:  -Patient unable to participate in complex medical decision due to current medical status.                 -Have been discussing care with patient's wife though unable to reach her via phone again today.  Wife had previously stated she was willing to be decision maker for patient and would involved patient's brothers in decision making as well since they all have a good relationship. Have been reviewing patient's overall medical condition and disease process with her.  Continuing appropriate medical interventions at this time in attempts to improve patient's mental status.   -Discussed care extensively with patient's brother, Nathan Roberts, as bedside today as detailed above in HPI. Nathan Roberts is a Clinical research associate and so reviewed with him [place] law regarding NOK/medical decision maker when patient's unable to speak for himself. Discussed based on [place] law since patient is still married, patient's wife Nathan Roberts is technical next of kin.  If unable to reasonably reach [place], noted majority of patients reasonably available parents and children over the age of 41 would be the next in line for medical decision making.  Dad noted that patient's parents are deceased.  Patient only has 1 son, Nathan Roberts.  Discussed Nathan Roberts would be decision-maker if patient's wife, Nathan Roberts, is unable to be recently reached.  Then discussed in [place] law hierarchy the decision if Nathan Roberts cannot be reasonably reached as the majority of patient's siblings.  Nathan Roberts noted that he is 1 of 2 brothers for the patient.  Discussed that at any point Nathan Roberts wants to defer medical decision  making to next family member, she is welcome to do so though initially when speaking to her on Friday and Saturday she still wanted to be involved in medical decision making for her husband's search their still technically married.   -Also reviewed pathways for medical care with Nathan Roberts who plans to assist in communication with family, particularly patient's son Nathan Roberts. Noted if Nathan Roberts cannot be reasonable reached, then Nathan Roberts is NOK/medical deicion maker since he is patient's only child as per [place].    See: [facility] Statutes Chapter 90. Medicine and Allied Occupations  90-21.13. Informed consent to health care treatment or procedure. In brief summary, this statute outlines that the following persons, in order indicated, are authorized to consent to medical treatment on behalf of the patient who does not demonstrate capacity to do so: Legally assigned guardian appointed by the court > healthcare agent appointed by legal healthcare power of attorney > healthcare agent appointed by the patient > legal spouse > majority of the patient's reasonably available parents and children who are at least 14 years of age > majority of reasonably available siblings over 103 ears of age > individual who has an established relationship with the patient, who is acting in good faith on behalf of the patient, and who can reliably convey the patient's wishes > attending physician  with confirmation by another physician of the patient's condition and the necessity for treatment (unless in urgent/emergent situation).                 -  Code Status: Full Code    -Have encouraged Nathan Roberts and Nathan Roberts to discuss with family change of code status to DNR as concern interventions such as cardiac resuscitation would not bring quality of life to patient with his underlying medical conditions.   # Symptom management:  -As per PCCM   # Psychosocial Support:  -wife (separated though not divorced), brothers, 1 son, uncles   #  Discharge Planning: To Be Determined  Discussed with: PCCM provider, RN, patient's brother Nathan Roberts  Thank you for allowing the palliative care team to participate in the care Nathan Roberts.  Alvester Morin, DO Palliative Care Provider PMT # [phone]  If patient remains symptomatic despite maximum doses, please call PMT at [phone] between 0700 and 1900. Outside of these hours, please call attending, as PMT does not have night coverage.  This provider spent a total of 57 minutes providing patient's care.  Includes review of EMR, discussing care with other staff members involved in patient's medical care, obtaining relevant history and information from patient and/or patient's family, and personal review of imaging and lab work. Greater than 50% of the time was spent counseling and coordinating care related to the above assessment and plan.    *Please note that this is a verbal dictation therefore any spelling or grammatical errors are due to the "[employer]agon [employer] One" system interpretation.

## 2023-04-30 NOTE — Progress Notes (Signed)
Nutrition Follow-up  DOCUMENTATION CODES:   Severe malnutrition in context of chronic illness  INTERVENTION:  - Continue goal TF: Vital 1.5 at 55 ml/h (1320 ml per day) Prosource TF20 60 ml BID Provides 2140 kcal, 129 gm protein, 1008 ml free water daily   - FWF per CCM/MD.   - Add Banatrol BID to aid in bulking of stools.   - Monitor weight trends.     NUTRITION DIAGNOSIS:   Severe Malnutrition related to chronic illness (alcohol-related hepatic cirrhosis) as evidenced by severe fat depletion, severe muscle depletion. *ongoing  GOAL:   Patient will meet greater than or equal to 90% of their needs *met with TF  MONITOR:   Vent status, Labs, Weight trends  REASON FOR ASSESSMENT:   Consult Assessment of nutrition requirement/status  ASSESSMENT:   62 year old male with PMH significant for alcohol-related hepatic cirrhosis, ascites refractory to multiple paracentesis and diuretics, esophageal varices, and hepatic encephalopathy. Had a recent admission in late August for ascites who presented after he was noted to exhibit unusual behavior at the grocery store. Admitted for hepatic encephalopathy.  9/24: Admit  9/26: Intubated 9/27: Vital 1.5 started at 23mL/hr  Patient remains intubated and sedated.  Brother at bedside today but talking with palliative care at time of visit.   TF remains at goal of 25mL/hr, patient appears to be tolerating.  Noted to be having diarrhea although remains on lactulose. Will add Banatrol BID to aid in bulking of stools.  Palliative care following for GOC. Currently full code.    Admit weight: 174# Current weight: 164# I&O's: +8.9L  Medications reviewed and include: Lactulose, Lasix, Insulin  Labs reviewed: -   Diet Order:   Diet Order             Diet NPO time specified  Diet effective now                   EDUCATION NEEDS:  Not appropriate for education at this time  Skin:  Skin Assessment: Reviewed RN  Assessment  Last BM:  9/29 - type 7  Height:  Ht Readings from Last 1 Encounters:  04/26/23 5\' 8"  (1.727 m)   Weight:  Wt Readings from Last 1 Encounters:  04/29/23 74.7 kg    BMI:  Body mass index is 25.04 kg/m.  Estimated Nutritional Needs:  Kcal:  1950-2350 kcals Protein:  115-135 grams Fluid:  >/= 1.9L    Shelle Iron RD, LDN For contact information, refer to [facility].

## 2023-04-30 NOTE — Progress Notes (Signed)
eLink Physician-Brief Progress Note Patient Name: Nathan Roberts DOB: 06-22-1961 MRN: [id number]   Date of Service  04/30/2023  HPI/Events of Note  62 year old male with past medical history as below, which is significant for alcohol-related hepatic cirrhosis, ascites refractory to multiple paracentesis and diuretics, medical noncompliance, esophageal varices, and hepatic encephalopathy which had been well-controlled with medications.  Presented with decompensated cirrhosis and hepatic encephalopathy.  Has progressively become more awake and restless pulling at his ET tube  eICU Interventions  Add restraints as needed  Can initiate Precedex, maintain RASS of -1 to 0 with daily spontaneous awakening and breathing trials     Intervention Category Minor Interventions: Agitation / anxiety - evaluation and management  Miriya Cloer 04/30/2023, 10:50 PM

## 2023-04-30 NOTE — Progress Notes (Signed)
Chaplain attempted to contact Mats's wife related to request from Bryker's uncle Harvie Heck for [employer] priest.  Lunette Stands was unable to reach her and left her a HIPAA compliant voicemail.  [address], Bcc Pager, [phone]

## 2023-04-30 NOTE — Progress Notes (Signed)
Patient experiencing increasing agitation leading to him popping himself off the vent. Notified CCM MD and requested additional sedation. Provider requested to stick with PRN Fentanyl already ordered and to have Elink provider to order precedex if needed. No further orders given.

## 2023-04-30 NOTE — Plan of Care (Signed)
  Problem: Clinical Measurements: Goal: Will remain free from infection Outcome: Progressing Goal: Cardiovascular complication will be avoided Outcome: Progressing   Problem: Nutrition: Goal: Adequate nutrition will be maintained Outcome: Progressing   Problem: Elimination: Goal: Will not experience complications related to bowel motility Outcome: Progressing Goal: Will not experience complications related to urinary retention Outcome: Progressing   Problem: Safety: Goal: Ability to remain free from injury will improve Outcome: Progressing   Problem: Education: Goal: Knowledge of General Education information will improve Description: Including pain rating scale, medication(s)/side effects and non-pharmacologic comfort measures Outcome: Not Progressing   Problem: Health Behavior/Discharge Planning: Goal: Ability to manage health-related needs will improve Outcome: Not Progressing   Problem: Clinical Measurements: Goal: Ability to maintain clinical measurements within normal limits will improve Outcome: Not Progressing Goal: Diagnostic test results will improve Outcome: Not Progressing Goal: Respiratory complications will improve Outcome: Not Progressing   Problem: Activity: Goal: Risk for activity intolerance will decrease Outcome: Not Progressing   Problem: Coping: Goal: Level of anxiety will decrease Outcome: Not Progressing   Problem: Pain Managment: Goal: General experience of comfort will improve Outcome: Not Progressing   Problem: Skin Integrity: Goal: Risk for impaired skin integrity will decrease Outcome: Not Progressing   Problem: Respiratory: Goal: Ability to maintain a clear airway and adequate ventilation will improve Outcome: Not Progressing   Problem: Role Relationship: Goal: Method of communication will improve Outcome: Not Progressing

## 2023-05-01 ENCOUNTER — Inpatient Hospital Stay (HOSPITAL_COMMUNITY): Payer: 59

## 2023-05-01 DIAGNOSIS — J9600 Acute respiratory failure, unspecified whether with hypoxia or hypercapnia: Secondary | ICD-10-CM | POA: Diagnosis not present

## 2023-05-01 DIAGNOSIS — K7682 Hepatic encephalopathy: Secondary | ICD-10-CM | POA: Diagnosis not present

## 2023-05-01 LAB — GLUCOSE, CAPILLARY
Glucose-Capillary: 142 mg/dL — ABNORMAL HIGH (ref 70–99)
Glucose-Capillary: 148 mg/dL — ABNORMAL HIGH (ref 70–99)
Glucose-Capillary: 151 mg/dL — ABNORMAL HIGH (ref 70–99)
Glucose-Capillary: 163 mg/dL — ABNORMAL HIGH (ref 70–99)
Glucose-Capillary: 179 mg/dL — ABNORMAL HIGH (ref 70–99)
Glucose-Capillary: 191 mg/dL — ABNORMAL HIGH (ref 70–99)

## 2023-05-01 LAB — RENAL FUNCTION PANEL
Albumin: 2.4 g/dL — ABNORMAL LOW (ref 3.5–5.0)
Anion gap: 6 (ref 5–15)
BUN: 28 mg/dL — ABNORMAL HIGH (ref 8–23)
CO2: 18 mmol/L — ABNORMAL LOW (ref 22–32)
Calcium: 8 mg/dL — ABNORMAL LOW (ref 8.9–10.3)
Chloride: 112 mmol/L — ABNORMAL HIGH (ref 98–111)
Creatinine, Ser: 0.52 mg/dL — ABNORMAL LOW (ref 0.61–1.24)
GFR, Estimated: >60 mL/min (ref >60)
Glucose, Bld: 157 mg/dL — ABNORMAL HIGH (ref 70–99)
Phosphorus: 2.3 mg/dL — ABNORMAL LOW (ref 2.5–4.6)
Potassium: 4.4 mmol/L (ref 3.5–5.1)
Sodium: 136 mmol/L (ref 135–145)

## 2023-05-01 LAB — CBC
HCT: 34 % — ABNORMAL LOW (ref 39.0–52.0)
Hemoglobin: 10.7 g/dL — ABNORMAL LOW (ref 13.0–17.0)
MCH: 32.9 pg (ref 26.0–34.0)
MCHC: 31.5 g/dL (ref 30.0–36.0)
MCV: 104.6 fL — ABNORMAL HIGH (ref 80.0–100.0)
Platelets: 64 10*3/uL — ABNORMAL LOW (ref 150–400)
RBC: 3.25 MIL/uL — ABNORMAL LOW (ref 4.22–5.81)
RDW: 15.8 % — ABNORMAL HIGH (ref 11.5–15.5)
WBC: 10.3 10*3/uL (ref 4.0–10.5)
nRBC: 0 % (ref 0.0–0.2)

## 2023-05-01 LAB — VITAMIN B1: Vitamin B1 (Thiamine): 95.8 nmol/L (ref 66.5–200.0)

## 2023-05-01 LAB — MAGNESIUM: Magnesium: 2.5 mg/dL — ABNORMAL HIGH (ref 1.7–2.4)

## 2023-05-01 LAB — AMMONIA: Ammonia: 91 umol/L — ABNORMAL HIGH (ref 9–35)

## 2023-05-01 MED ORDER — POTASSIUM & SODIUM PHOSPHATES 280-160-250 MG PO PACK
2.0000 | PACK | Freq: Three times a day (TID) | ORAL | Status: AC
  Administered 2023-05-01 (×2): 2
  Filled 2023-05-01 (×2): qty 2

## 2023-05-01 NOTE — Progress Notes (Signed)
NAME:  Nathan Roberts, MRN:  [id number], DOB:  Nov 16, 1960, LOS: 7 ADMISSION DATE:  04/03/2023, CONSULTATION DATE: 9/26 REFERRING MD: Dr. Imogene Burn, CHIEF COMPLAINT: Hepatic encephalopathy  History of Present Illness:  Patient is encephalopathic and/or intubated; therefore, history has been obtained from chart review.  62 year old male with past medical history as below, which is significant for alcohol-related hepatic cirrhosis, ascites refractory to multiple paracentesis and diuretics, medical noncompliance, esophageal varices, and hepatic encephalopathy which had been well-controlled with medications.  Recently admitted to [facility] in late August for ascites.  He had a endoscopy at that time showing grade 1 esophageal varices.  He has been followed by both [facility], and [facility]. Last note from [facility] recommended Lasix and possibly spiro for volume. Cipro 500 daily for SBP ppx, and sodium restricted diet. He was felt to be a poor candidate for transplant due to social issues, history of non-compliance, and functional status. TIPs, however, was not considered to be off the table as his HE was able to be controlled with medications. Patient has declined TIPS in the past. Most recent paracentesis 9/20 during which 13L was removed.   He presented to [facility] ED 9/24 via EMS after he was noted to exhibit unusual behavior at the grocery store. He was oriented to self in the ED, but was otherwise confused. Ammonia level was noted to be high in correlation to his history of hepatic encephalopathy. He received a dose of lactulose in the ED and was admitted to the hospitalists for treatment of HE with lactulose and decompensated cirrhosis with diuretics and SBP ppx. However, in the overnight hours 9/25-26 he became increasingly lethargic and ammonia level climbed despite treatment. He was transferred to the ICU minimally responsive with poor protection of his airway and ultimately vomited. PCCM consulted  for consideration of intubation.   Pertinent  Medical History   has a past medical history of Allergy, Arthritis, Carpal tunnel syndrome, Diabetes mellitus without complication (HCC), Esophageal varices (HCC), GERD (gastroesophageal reflux disease), GI bleed, Hyperlipidemia, Hypertension, Pancreas divisum (03/22/2023), Portal hypertensive gastropathy (HCC), and Pre-diabetes.   Significant Hospital Events: Including procedures, antibiotic start and stop dates in addition to other pertinent events   9/24 presented with confusion. Admitted for treatment of HE 9/26 tx to ICU for lethargy, vomiting, aspiration concern. Intubated.  9/28 failed weaning 9/28-over 9 L paracentesis-sent for analysis 9/29 head CT negative  Interim History / Subjective:   Afebrile No new events overnight Mild intermittent agitation Diarrhea continues on lactulose  Objective   Blood pressure (!) 89/53, pulse 77, temperature 98.8 F (37.1 C), resp. rate 16, height 5\' 8"  (1.727 m), weight 74.5 kg, SpO2 99%.    Vent Mode: CPAP;PSV FiO2 (%):  [40 %] 40 % Set Rate:  [12 bmp] 12 bmp Vt Set:  [550 mL] 550 mL PEEP:  [5 cmH20] 5 cmH20 Pressure Support:  [8 cmH20] 8 cmH20 Plateau Pressure:  [10 cmH20-18 cmH20] 16 cmH20   Intake/Output Summary (Last 24 hours) at 05/01/2023 0854 Last data filed at 05/01/2023 0556 Gross per 24 hour  Intake 4435.6 ml  Output 1430 ml  Net 3005.6 ml   Filed Weights   04/26/23 0900 04/29/23 0708 05/01/23 0500  Weight: 78.6 kg 74.7 kg 74.5 kg    Examination: General: Middle-age, does not appear to be in distress HENT: Moist oral mucosa, endotracheal tube in place Lungs: Clear breath sounds bilateral, minimal secretions, no accessory muscle use Cardiovascular: S1-S2 appreciated Abdomen: Fluid thrill,  soft nontender Extremities: No edema Neuro: Moves his head around, responds to voice but does not follow commands  Labs show normal electrolytes, no leukocytosis, improved  platelets  Resolved Hospital Problem list     Assessment & Plan:   Acute hypoxemic respiratory failure Aspiration pneumonia -Spontaneous breathing trials but not ready for extubation yet due to poor mental status   Acute hepatic encephalopathy secondary to alcoholic cirrhosis -Continue lactulose and rifaximin -Continue to monitor ammonia level, still significantly elevated -Lactulose every 6 hours - fent int prn goal RASS 0   Alcoholic cirrhosis with meld of 18, MELD sodium of 22 Refractory ascites -S/p paracentesis for 9.2 L -hold further para -Continue spironolactone  Portal hypertension -Will continue Lasix, spironolactone as BP tolerates -Not a transplant candidate Appreciate GI recommendations  Hypertension -Continue to monitor -Discontinued propranolol because of hypotension  Diabetes -Continue SSI  Goals of care discussions ongoing -Appreciate palliative care involvement , updated family  Best Practice (right click and "Reselect all SmartList Selections" daily)   Diet/type: tubefeeds DVT prophylaxis: not indicated GI prophylaxis: PPI Lines: N/A Foley:  N/A Code Status:  full code Last date of multidisciplinary goals of care discussion [  Labs   CBC: Recent Labs  Lab 04/26/23 0913 04/27/23 0319 04/28/23 0522 04/29/23 0942 04/30/23 0301 05/01/23 0323  WBC 8.4 6.9 8.9 7.8 8.4 10.3  NEUTROABS 6.0 3.9  --  4.6  --   --   HGB 11.5* 10.3* 10.0* 10.7* 10.4* 10.7*  HCT 35.5* 32.4* 31.5* 33.8* 32.9* 34.0*  MCV 99.7 100.9* 100.0 102.1* 102.5* 104.6*  PLT 109* 107* 119* 99* 106* 64*    Basic Metabolic Panel: Recent Labs  Lab 04/27/23 0319 04/27/23 1636 04/28/23 0522 04/28/23 1647 04/29/23 0511 04/29/23 0942 04/30/23 0301 05/01/23 0323  NA 133*  --  136  --   --  138 140 136  K 4.8  --  3.9  --   --  3.6 3.9 4.4  CL 103  --  108  --   --  111 114* 112*  CO2 19*  --  20*  --   --  21* 21* 18*  GLUCOSE 128*  --  135*  --   --  135* 127* 157*   BUN 32*  --  33*  --   --  23 22 28*  CREATININE 1.21  --  0.88  --   --  0.52* 0.39* 0.52*  CALCIUM 8.8*  --  8.4*  --   --  8.5* 8.2* 8.0*  MG 2.2 2.4 2.2 2.2 2.3  --   --  2.5*  PHOS 6.0* 4.7* 3.0 2.5 1.9*  --  2.6 2.3*   GFR: Estimated Creatinine Clearance: 90.3 mL/min (A) (by C-G formula based on SCr of 0.52 mg/dL (L)). Recent Labs  Lab 04/26/23 0913 04/27/23 0319 04/28/23 0522 04/29/23 0942 04/30/23 0301 05/01/23 0323  PROCALCITON 0.28  --   --   --   --   --   WBC 8.4   < > 8.9 7.8 8.4 10.3   < > = values in this interval not displayed.    Liver Function Tests: Recent Labs  Lab 04/25/23 0542 04/26/23 0519 04/27/23 0319 04/28/23 0522 04/29/23 0942 05/01/23 0323  AST 48* 51* 44* 57* 45*  --   ALT 20 23 21 24 21   --   ALKPHOS 88 82 79 90 74  --   BILITOT 3.0* 4.7* 4.4* 3.2* 3.2*  --   PROT 6.0* 6.7  6.3* 6.4* 5.9*  --   ALBUMIN 2.8* 3.0* 2.8* 2.7* 3.1* 2.4*   No results for input(s): "LIPASE", "AMYLASE" in the last 168 hours. Recent Labs  Lab 04/28/23 0522 04/29/23 0511 04/29/23 0942 04/30/23 0301 05/01/23 0323  AMMONIA 110* 142* 89* 132* 91*    ABG    Component Value Date/Time   PHART 7.47 (H) 04/27/2023 0420   PCO2ART 29 (L) 04/27/2023 0420   PO2ART 161 (H) 04/27/2023 0420   HCO3 20.6 04/27/2023 0420   ACIDBASEDEF 2.1 (H) 04/27/2023 0420   O2SAT 100 04/27/2023 0420     Coagulation Profile: Recent Labs  Lab 2023/05/17 1600 04/26/23 0913 04/28/23 1154  INR 1.7* 1.6* 1.8*    Cardiac Enzymes: No results for input(s): "CKTOTAL", "CKMB", "CKMBINDEX", "TROPONINI" in the last 168 hours.  HbA1C: Hgb A1c MFr Bld  Date/Time Value Ref Range Status  03/22/2023 03:06 AM 4.6 (L) 4.8 - 5.6 % Final    Comment:    (NOTE) Pre diabetes:          5.7%-6.4%  Diabetes:              >6.4%  Glycemic control for   <7.0% adults with diabetes   12/22/2022 11:01 AM 4.8 4.6 - 6.5 % Final    Comment:    Glycemic Control Guidelines for People with  Diabetes:Non Diabetic:  <6%Goal of Therapy: <7%Additional Action Suggested:  >8%     CBG: Recent Labs  Lab 04/30/23 1545 04/30/23 1929 04/30/23 2337 05/01/23 0422 05/01/23 0755  GLUCAP 124* 154* 157* 142* 148*    My cc time x 35m  Cyril Mourning MD. FCCP. [facility] Pager : [phone]  If no response to pager , please call [phone] until 7 pm After 7:00 pm call Elink  [phone]   05/01/2023

## 2023-05-01 NOTE — Progress Notes (Signed)
Pt placed back on full vent support due to increased RR and restlessness.  Pt suctioned for thick tan frothy secretions.

## 2023-05-01 NOTE — Progress Notes (Signed)
This RN spoke with the patient's wife, Saleh Ulbrich, on the phone. She stated that moving forward all decisions should be made by Isabella Stalling. Randy's phone number is in the patients chart. Message passed along to patient's RN, Lorn Junes and Dr. Vassie Loll.

## 2023-05-01 NOTE — Plan of Care (Signed)
  Problem: Education: Goal: Knowledge of General Education information will improve Description: Including pain rating scale, medication(s)/side effects and non-pharmacologic comfort measures Outcome: Progressing   Problem: Health Behavior/Discharge Planning: Goal: Ability to manage health-related needs will improve Outcome: Progressing   Problem: Clinical Measurements: Goal: Ability to maintain clinical measurements within normal limits will improve Outcome: Progressing Goal: Will remain free from infection Outcome: Progressing Goal: Diagnostic test results will improve Outcome: Progressing Goal: Respiratory complications will improve Outcome: Progressing Goal: Cardiovascular complication will be avoided Outcome: Progressing   Problem: Activity: Goal: Risk for activity intolerance will decrease Outcome: Progressing   Problem: Nutrition: Goal: Adequate nutrition will be maintained Outcome: Progressing   Problem: Coping: Goal: Level of anxiety will decrease Outcome: Progressing   Problem: Elimination: Goal: Will not experience complications related to bowel motility Outcome: Progressing Goal: Will not experience complications related to urinary retention Outcome: Progressing   Problem: Pain Managment: Goal: General experience of comfort will improve Outcome: Progressing   Problem: Safety: Goal: Ability to remain free from injury will improve Outcome: Progressing   Problem: Skin Integrity: Goal: Risk for impaired skin integrity will decrease Outcome: Progressing   Problem: Activity: Goal: Ability to tolerate increased activity will improve Outcome: Progressing   Problem: Respiratory: Goal: Ability to maintain a clear airway and adequate ventilation will improve Outcome: Progressing   Problem: Role Relationship: Goal: Method of communication will improve Outcome: Progressing   Problem: Education: Goal: Ability to describe self-care measures that may  prevent or decrease complications (Diabetes Survival Skills Education) will improve Outcome: Progressing Goal: Individualized Educational Video(s) Outcome: Progressing   Problem: Coping: Goal: Ability to adjust to condition or change in health will improve Outcome: Progressing   Problem: Fluid Volume: Goal: Ability to maintain a balanced intake and output will improve Outcome: Progressing   Problem: Health Behavior/Discharge Planning: Goal: Ability to identify and utilize available resources and services will improve Outcome: Progressing Goal: Ability to manage health-related needs will improve Outcome: Progressing   Problem: Metabolic: Goal: Ability to maintain appropriate glucose levels will improve Outcome: Progressing   Problem: Nutritional: Goal: Maintenance of adequate nutrition will improve Outcome: Progressing Goal: Progress toward achieving an optimal weight will improve Outcome: Progressing   Problem: Skin Integrity: Goal: Risk for impaired skin integrity will decrease Outcome: Progressing   Problem: Tissue Perfusion: Goal: Adequacy of tissue perfusion will improve Outcome: Progressing   Problem: Safety: Goal: Non-violent Restraint(s) Outcome: Progressing   

## 2023-05-01 NOTE — Progress Notes (Signed)
NAME:  Nathan Roberts, MRN:  [id number], DOB:  Jul 24, 1961, LOS: 7 ADMISSION DATE:  04/30/2023, CONSULTATION DATE: 9/26 REFERRING MD: Dr. Imogene Burn, CHIEF COMPLAINT: Hepatic encephalopathy  History of Present Illness:  Patient is encephalopathic and/or intubated; therefore, history has been obtained from chart review.  62 year old male with past medical history as below, which is significant for alcohol-related hepatic cirrhosis, ascites refractory to multiple paracentesis and diuretics, medical noncompliance, esophageal varices, and hepatic encephalopathy which had been well-controlled with medications.  Recently admitted to [facility] in late August for ascites.  He had a endoscopy at that time showing grade 1 esophageal varices.  He has been followed by both [facility], and [facility]. Last note from [facility] recommended Lasix and possibly spiro for volume. Cipro 500 daily for SBP ppx, and sodium restricted diet. He was felt to be a poor candidate for transplant due to social issues, history of non-compliance, and functional status. TIPs, however, was not considered to be off the table as his HE was able to be controlled with medications. Patient has declined TIPS in the past. Most recent paracentesis 9/20 during which 13L was removed.   He presented to [facility] ED 9/24 via EMS after he was noted to exhibit unusual behavior at the grocery store. He was oriented to self in the ED, but was otherwise confused. Ammonia level was noted to be high in correlation to his history of hepatic encephalopathy. He received a dose of lactulose in the ED and was admitted to the hospitalists for treatment of HE with lactulose and decompensated cirrhosis with diuretics and SBP ppx. However, in the overnight hours 9/25-26 he became increasingly lethargic and ammonia level climbed despite treatment. He was transferred to the ICU minimally responsive with poor protection of his airway and ultimately vomited. PCCM consulted  for consideration of intubation.   Pertinent  Medical History   has a past medical history of Allergy, Arthritis, Carpal tunnel syndrome, Diabetes mellitus without complication (HCC), Esophageal varices (HCC), GERD (gastroesophageal reflux disease), GI bleed, Hyperlipidemia, Hypertension, Pancreas divisum (03/22/2023), Portal hypertensive gastropathy (HCC), and Pre-diabetes.   Significant Hospital Events: Including procedures, antibiotic start and stop dates in addition to other pertinent events   9/24 presented with confusion. Admitted for treatment of HE 9/26 tx to ICU for lethargy, vomiting, aspiration concern. Intubated.  9/28 failed weaning 9/28-over 9 L paracentesis-sent for analysis 9/29 head CT negative  Interim History / Subjective:   Agitation in spite of fentanyl requiring addition of Precedex overnight Loose stools continue lactulose Moderate secretions this morning  Objective   Blood pressure (!) 89/53, pulse 77, temperature 98.8 F (37.1 C), resp. rate 16, height 5\' 8"  (1.727 m), weight 74.5 kg, SpO2 99%.    Vent Mode: CPAP;PSV FiO2 (%):  [40 %] 40 % Set Rate:  [12 bmp] 12 bmp Vt Set:  [550 mL] 550 mL PEEP:  [5 cmH20] 5 cmH20 Pressure Support:  [8 cmH20] 8 cmH20 Plateau Pressure:  [10 cmH20-18 cmH20] 16 cmH20   Intake/Output Summary (Last 24 hours) at 05/01/2023 0915 Last data filed at 05/01/2023 0556 Gross per 24 hour  Intake 4435.6 ml  Output 1430 ml  Net 3005.6 ml   Filed Weights   04/26/23 0900 04/29/23 0708 05/01/23 0500  Weight: 78.6 kg 74.7 kg 74.5 kg    Examination: General: Middle-aged man, orally intubated, no distress HENT: Moist oral mucosa, no pallor or icterus Lungs: Clear breath sounds bilateral, moderate secretions, no accessory muscle use Cardiovascular: S1-S2  appreciated Abdomen: Fluid thrill, soft nontender Extremities: No edema Neuro: Follows one-step commands, intermittent agitation  Labs show normal electrolytes, no leukocytosis,  low phosphate  Resolved Hospital Problem list     Assessment & Plan:   Acute hypoxemic respiratory failure Aspiration pneumonia -Spontaneous breathing trials - getting closer to extubation, mental status improved however secretions are an issue, if these clear then we can consider extubation -Obtain respiratory culture   Acute hepatic encephalopathy secondary to alcoholic cirrhosis -Continue lactulose every 8 hours and rifaximin -No need to monitor ammonia levels - fent int prn goal RASS 0   Alcoholic cirrhosis with meld of 18, MELD sodium of 22 Portal hypertension Refractory ascites -S/p paracentesis for 9.2 L -hold further para -Continue spironolactone and Lasix as blood pressure permits -Not a transplant candidate Appreciate GI recommendations  Hypertension -Continue to monitor -Discontinued propranolol because of hypotension  Diabetes -Continue SSI  Hypophosphatemia will be repleted  Goals of care discussions ongoing -Appreciate palliative care involvement   Best Practice (right click and "Reselect all SmartList Selections" daily)   Diet/type: tubefeeds DVT prophylaxis: not indicated GI prophylaxis: PPI Lines: N/A Foley:  N/A Code Status:  full code Last date of multidisciplinary goals of care discussion -updated wife [name] will be our point of contact and she will update other family members  Labs   CBC: Recent Labs  Lab 04/26/23 0913 04/27/23 0319 04/28/23 0522 04/29/23 0942 04/30/23 0301 05/01/23 0323  WBC 8.4 6.9 8.9 7.8 8.4 10.3  NEUTROABS 6.0 3.9  --  4.6  --   --   HGB 11.5* 10.3* 10.0* 10.7* 10.4* 10.7*  HCT 35.5* 32.4* 31.5* 33.8* 32.9* 34.0*  MCV 99.7 100.9* 100.0 102.1* 102.5* 104.6*  PLT 109* 107* 119* 99* 106* 64*    Basic Metabolic Panel: Recent Labs  Lab 04/27/23 0319 04/27/23 1636 04/28/23 0522 04/28/23 1647 04/29/23 0511 04/29/23 0942 04/30/23 0301 05/01/23 0323  NA 133*  --  136  --   --  138 140 136  K 4.8  --   3.9  --   --  3.6 3.9 4.4  CL 103  --  108  --   --  111 114* 112*  CO2 19*  --  20*  --   --  21* 21* 18*  GLUCOSE 128*  --  135*  --   --  135* 127* 157*  BUN 32*  --  33*  --   --  23 22 28*  CREATININE 1.21  --  0.88  --   --  0.52* 0.39* 0.52*  CALCIUM 8.8*  --  8.4*  --   --  8.5* 8.2* 8.0*  MG 2.2 2.4 2.2 2.2 2.3  --   --  2.5*  PHOS 6.0* 4.7* 3.0 2.5 1.9*  --  2.6 2.3*   GFR: Estimated Creatinine Clearance: 90.3 mL/min (A) (by C-G formula based on SCr of 0.52 mg/dL (L)). Recent Labs  Lab 04/26/23 0913 04/27/23 0319 04/28/23 0522 04/29/23 0942 04/30/23 0301 05/01/23 0323  PROCALCITON 0.28  --   --   --   --   --   WBC 8.4   < > 8.9 7.8 8.4 10.3   < > = values in this interval not displayed.    Liver Function Tests: Recent Labs  Lab 04/25/23 0542 04/26/23 0519 04/27/23 0319 04/28/23 0522 04/29/23 0942 05/01/23 0323  AST 48* 51* 44* 57* 45*  --   ALT 20 23 21 24 21   --  ALKPHOS 88 82 79 90 74  --   BILITOT 3.0* 4.7* 4.4* 3.2* 3.2*  --   PROT 6.0* 6.7 6.3* 6.4* 5.9*  --   ALBUMIN 2.8* 3.0* 2.8* 2.7* 3.1* 2.4*   No results for input(s): "LIPASE", "AMYLASE" in the last 168 hours. Recent Labs  Lab 04/28/23 0522 04/29/23 0511 04/29/23 0942 04/30/23 0301 05/01/23 0323  AMMONIA 110* 142* 89* 132* 91*    ABG    Component Value Date/Time   PHART 7.47 (H) 04/27/2023 0420   PCO2ART 29 (L) 04/27/2023 0420   PO2ART 161 (H) 04/27/2023 0420   HCO3 20.6 04/27/2023 0420   ACIDBASEDEF 2.1 (H) 04/27/2023 0420   O2SAT 100 04/27/2023 0420     Coagulation Profile: Recent Labs  Lab 04/04/2023 1600 04/26/23 0913 04/28/23 1154  INR 1.7* 1.6* 1.8*    Cardiac Enzymes: No results for input(s): "CKTOTAL", "CKMB", "CKMBINDEX", "TROPONINI" in the last 168 hours.  HbA1C: Hgb A1c MFr Bld  Date/Time Value Ref Range Status  03/22/2023 03:06 AM 4.6 (L) 4.8 - 5.6 % Final    Comment:    (NOTE) Pre diabetes:          5.7%-6.4%  Diabetes:              >6.4%  Glycemic  control for   <7.0% adults with diabetes   12/22/2022 11:01 AM 4.8 4.6 - 6.5 % Final    Comment:    Glycemic Control Guidelines for People with Diabetes:Non Diabetic:  <6%Goal of Therapy: <7%Additional Action Suggested:  >8%     CBG: Recent Labs  Lab 04/30/23 1545 04/30/23 1929 04/30/23 2337 05/01/23 0422 05/01/23 0755  GLUCAP 124* 154* 157* 142* 148*    My cc time x 17m  Cyril Mourning MD. FCCP. [place] [facility] Pager : [phone]  If no response to pager , please call [phone] until 7 pm After 7:00 pm call Elink  [phone]   05/01/2023

## 2023-05-01 NOTE — Progress Notes (Signed)
Nathan Roberts 1:57 PM  Subjective: Patient seen and examined today and case discussed with his nurse and he was more alert this morning not so much now and he has been having constant diarrhea no new complaints no family at bedside  Objective: Vital signs stable afebrile no acute distress still intubated unable to arouse abdomen is soft nontender probably some ascites labs stable ammonia little lower  Assessment: Cirrhosis and encephalopathy  Plan: Please call me if any specific GI question or problem otherwise we will check on later in the week and hopefully he can be extubated soon  [facility] [place]  office [phone] After 5PM or if no answer call [phone]

## 2023-05-01 DEATH — deceased

## 2023-05-02 DIAGNOSIS — K7682 Hepatic encephalopathy: Secondary | ICD-10-CM | POA: Diagnosis not present

## 2023-05-02 DIAGNOSIS — J9600 Acute respiratory failure, unspecified whether with hypoxia or hypercapnia: Secondary | ICD-10-CM | POA: Diagnosis not present

## 2023-05-02 LAB — CBC WITH DIFFERENTIAL/PLATELET
Abs Immature Granulocytes: 0.05 10*3/uL (ref 0.00–0.07)
Basophils Absolute: 0 10*3/uL (ref 0.0–0.1)
Basophils Relative: 0 %
Eosinophils Absolute: 0.3 10*3/uL (ref 0.0–0.5)
Eosinophils Relative: 3 %
HCT: 33 % — ABNORMAL LOW (ref 39.0–52.0)
Hemoglobin: 10.1 g/dL — ABNORMAL LOW (ref 13.0–17.0)
Immature Granulocytes: 1 %
Lymphocytes Relative: 10 %
Lymphs Abs: 1 10*3/uL (ref 0.7–4.0)
MCH: 32.4 pg (ref 26.0–34.0)
MCHC: 30.6 g/dL (ref 30.0–36.0)
MCV: 105.8 fL — ABNORMAL HIGH (ref 80.0–100.0)
Monocytes Absolute: 1.6 10*3/uL — ABNORMAL HIGH (ref 0.1–1.0)
Monocytes Relative: 17 %
Neutro Abs: 6.5 10*3/uL (ref 1.7–7.7)
Neutrophils Relative %: 69 %
Platelets: 85 10*3/uL — ABNORMAL LOW (ref 150–400)
RBC: 3.12 MIL/uL — ABNORMAL LOW (ref 4.22–5.81)
RDW: 16.1 % — ABNORMAL HIGH (ref 11.5–15.5)
WBC: 9.5 10*3/uL (ref 4.0–10.5)
nRBC: 0 % (ref 0.0–0.2)

## 2023-05-02 LAB — GLUCOSE, CAPILLARY
Glucose-Capillary: 180 mg/dL — ABNORMAL HIGH (ref 70–99)
Glucose-Capillary: 167 mg/dL — ABNORMAL HIGH (ref 70–99)
Glucose-Capillary: 187 mg/dL — ABNORMAL HIGH (ref 70–99)
Glucose-Capillary: 157 mg/dL — ABNORMAL HIGH (ref 70–99)
Glucose-Capillary: 176 mg/dL — ABNORMAL HIGH (ref 70–99)
Glucose-Capillary: 134 mg/dL — ABNORMAL HIGH (ref 70–99)

## 2023-05-02 LAB — RENAL FUNCTION PANEL
Albumin: 2.7 g/dL — ABNORMAL LOW (ref 3.5–5.0)
Anion gap: 8 (ref 5–15)
BUN: 41 mg/dL — ABNORMAL HIGH (ref 8–23)
CO2: 20 mmol/L — ABNORMAL LOW (ref 22–32)
Calcium: 8.2 mg/dL — ABNORMAL LOW (ref 8.9–10.3)
Chloride: 111 mmol/L (ref 98–111)
Creatinine, Ser: 0.6 mg/dL — ABNORMAL LOW (ref 0.61–1.24)
GFR, Estimated: >60 mL/min (ref >60)
Glucose, Bld: 178 mg/dL — ABNORMAL HIGH (ref 70–99)
Phosphorus: 2.4 mg/dL — ABNORMAL LOW (ref 2.5–4.6)
Potassium: 4.7 mmol/L (ref 3.5–5.1)
Sodium: 139 mmol/L (ref 135–145)

## 2023-05-02 LAB — AMMONIA: Ammonia: 228 umol/L — ABNORMAL HIGH (ref 9–35)

## 2023-05-02 LAB — PHOSPHORUS: Phosphorus: 2.5 mg/dL (ref 2.5–4.6)

## 2023-05-02 LAB — MAGNESIUM: Magnesium: 2.6 mg/dL — ABNORMAL HIGH (ref 1.7–2.4)

## 2023-05-02 MED ORDER — LACTULOSE 10 GM/15ML PO SOLN: 30.0000 g | Freq: Four times a day (QID) | ORAL | Status: DC

## 2023-05-02 MED ORDER — ACETAMINOPHEN 325 MG PO TABS: 650.0000 mg | ORAL_TABLET | Freq: Four times a day (QID) | ORAL | Status: DC | PRN

## 2023-05-02 MED ORDER — LACTULOSE 10 GM/15ML PO SOLN
30.0000 g | Freq: Four times a day (QID) | ORAL | Status: DC
  Administered 2023-05-02 – 2023-05-03 (×4): 30 g
  Filled 2023-05-02 (×4): qty 45

## 2023-05-02 MED ORDER — ACETAMINOPHEN 325 MG PO TABS
650.0000 mg | ORAL_TABLET | Freq: Four times a day (QID) | ORAL | Status: DC | PRN
  Administered 2023-05-02 – 2023-05-03 (×2): 650 mg
  Filled 2023-05-02 (×2): qty 2

## 2023-05-02 MED ORDER — PANTOPRAZOLE SODIUM 40 MG PO TBEC: 40.0000 mg | DELAYED_RELEASE_TABLET | Freq: Every day | ORAL | Status: DC

## 2023-05-02 MED ORDER — FUROSEMIDE 40 MG PO TABS
40.0000 mg | ORAL_TABLET | Freq: Every day | ORAL | Status: DC
  Administered 2023-05-02 – 2023-05-04 (×3): 40 mg
  Filled 2023-05-02 (×3): qty 1

## 2023-05-02 MED ORDER — PANTOPRAZOLE SODIUM 40 MG IV SOLR
40.0000 mg | INTRAVENOUS | Status: DC
  Administered 2023-05-02 – 2023-05-03 (×2): 40 mg via INTRAVENOUS
  Filled 2023-05-02 (×2): qty 10

## 2023-05-02 NOTE — Progress Notes (Signed)
NAME:  Nathan Roberts, MRN:  [id number], DOB:  1961/05/03, LOS: 8 ADMISSION DATE:  04/19/2023, CONSULTATION DATE: 9/26 REFERRING MD: Dr. Imogene Burn, CHIEF COMPLAINT: Hepatic encephalopathy  History of Present Illness:  Patient is encephalopathic and/or intubated; therefore, history has been obtained from chart review.  62 year old male with past medical history as below, which is significant for alcohol-related hepatic cirrhosis, ascites refractory to multiple paracentesis and diuretics, medical noncompliance, esophageal varices, and hepatic encephalopathy which had been well-controlled with medications.  Recently admitted to [facility] in late August for ascites.  He had a endoscopy at that time showing grade 1 esophageal varices.  He has been followed by both [facility], and [facility]. Last note from [facility] recommended Lasix and possibly spiro for volume. Cipro 500 daily for SBP ppx, and sodium restricted diet. He was felt to be a poor candidate for transplant due to social issues, history of non-compliance, and functional status. TIPs, however, was not considered to be off the table as his HE was able to be controlled with medications. Patient has declined TIPS in the past. Most recent paracentesis 9/20 during which 13L was removed.   He presented to [facility] ED 9/24 via EMS after he was noted to exhibit unusual behavior at the grocery store. He was oriented to self in the ED, but was otherwise confused. Ammonia level was noted to be high in correlation to his history of hepatic encephalopathy. He received a dose of lactulose in the ED and was admitted to the hospitalists for treatment of HE with lactulose and decompensated cirrhosis with diuretics and SBP ppx. However, in the overnight hours 9/25-26 he became increasingly lethargic and ammonia level climbed despite treatment. He was transferred to the ICU minimally responsive with poor protection of his airway and ultimately vomited. PCCM consulted  for consideration of intubation.   Pertinent  Medical History   has a past medical history of Allergy, Arthritis, Carpal tunnel syndrome, Diabetes mellitus without complication (HCC), Esophageal varices (HCC), GERD (gastroesophageal reflux disease), GI bleed, Hyperlipidemia, Hypertension, Pancreas divisum (03/22/2023), Portal hypertensive gastropathy (HCC), and Pre-diabetes.   Significant Hospital Events: Including procedures, antibiotic start and stop dates in addition to other pertinent events   9/24 presented with confusion. Admitted for treatment of HE 9/26 tx to ICU for lethargy, vomiting, aspiration concern. Intubated.  9/28 failed weaning 9/28-over 9 L paracentesis-sent for analysis 9/29 head CT negative 10/1 awake and following commands, extubation deferred due to increased secretions  Interim History / Subjective:   Low-grade febrile last 24 hours, defervesced now Intermittent agitation, last dose of fentanyl this morning. Secretions decreased   Objective   Blood pressure (!) 152/62, pulse 95, temperature 99.7 F (37.6 C), resp. rate 19, height 5\' 8"  (1.727 m), weight 74.6 kg, SpO2 98%.    Vent Mode: PRVC FiO2 (%):  [30 %-40 %] 30 % Set Rate:  [12 bmp] 12 bmp Vt Set:  [550 mL] 550 mL PEEP:  [5 cmH20] 5 cmH20 Pressure Support:  [5 cmH20-8 cmH20] 5 cmH20 Plateau Pressure:  [15 cmH20-24 cmH20] 19 cmH20   Intake/Output Summary (Last 24 hours) at 05/02/2023 0830 Last data filed at 05/02/2023 0630 Gross per 24 hour  Intake 2384.93 ml  Output 1190 ml  Net 1194.93 ml   Filed Weights   04/29/23 0708 05/01/23 0500 05/02/23 0500  Weight: 74.7 kg 74.5 kg 74.6 kg    Examination: General: Middle-aged man, orally intubated, no distress HENT: Moist oral mucosa, no pallor or icterus  Lungs: No accessory muscle use, clear breath sounds bilateral Cardiovascular: S1-S2  Abdomen: Fluid thrill, soft nontender Extremities: No edema Neuro: Appears calm, right-sided gaze preference,  does not follow commands  Labs show normal electrolytes, no leukocytosis, mild low phosphate Chest x-ray 10/1 shows interstitial edema small right effusion, no  new consolidation  Resolved Hospital Problem list     Assessment & Plan:   Acute hypoxemic respiratory failure Aspiration pneumonia -Spontaneous breathing trials - getting closer to extubation, mental status is fluctuant and secretions have to decrease for [place] to extubate -Follow respiratory culture -GPC and few GNR, Cipro should cover   Acute hepatic encephalopathy secondary to alcoholic cirrhosis -Continue lactulose every 8 hours and rifaximin, stool output has decreased -No need to monitor ammonia levels - fent int prn goal RASS 0 , seems to drop blood pressure with Precedex   Alcoholic cirrhosis with meld of 18, MELD sodium of 22 Portal hypertension Refractory ascites -S/p paracentesis for 9.2 L -hold further para -Continue spironolactone and Lasix as blood pressure permits -Not a transplant candidate Appreciate GI recommendations  Hypertension -Discontinued propranolol because of hypotension  Diabetes -Continue SSI  Hypophosphatemia - repleted  Goals of care discussions ongoing -Appreciate palliative care involvement   Best Practice (right click and "Reselect all SmartList Selections" daily)   Diet/type: tubefeeds DVT prophylaxis: not indicated GI prophylaxis: PPI Lines: N/A Foley:  N/A Code Status:  full code Last date of multidisciplinary goals of care discussion -updated wife [name] 10/1 -she is now deferring to brother Harvie Heck as point of contact  Labs   CBC: Recent Labs  Lab 04/26/23 0913 04/27/23 0319 04/28/23 0522 04/29/23 0942 04/30/23 0301 05/01/23 0323 05/02/23 0316  WBC 8.4 6.9 8.9 7.8 8.4 10.3 9.5  NEUTROABS 6.0 3.9  --  4.6  --   --  6.5  HGB 11.5* 10.3* 10.0* 10.7* 10.4* 10.7* 10.1*  HCT 35.5* 32.4* 31.5* 33.8* 32.9* 34.0* 33.0*  MCV 99.7 100.9* 100.0 102.1* 102.5* 104.6*  105.8*  PLT 109* 107* 119* 99* 106* 64* 85*    Basic Metabolic Panel: Recent Labs  Lab 04/28/23 0522 04/28/23 1647 04/29/23 0511 04/29/23 0942 04/30/23 0301 05/01/23 0323 05/02/23 0316 05/02/23 0317  NA 136  --   --  138 140 136  --  139  K 3.9  --   --  3.6 3.9 4.4  --  4.7  CL 108  --   --  111 114* 112*  --  111  CO2 20*  --   --  21* 21* 18*  --  20*  GLUCOSE 135*  --   --  135* 127* 157*  --  178*  BUN 33*  --   --  23 22 28*  --  41*  CREATININE 0.88  --   --  0.52* 0.39* 0.52*  --  0.60*  CALCIUM 8.4*  --   --  8.5* 8.2* 8.0*  --  8.2*  MG 2.2 2.2 2.3  --   --  2.5* 2.6*  --   PHOS 3.0 2.5 1.9*  --  2.6 2.3* 2.5 2.4*   GFR: Estimated Creatinine Clearance: 90.3 mL/min (A) (by C-G formula based on SCr of 0.6 mg/dL (L)). Recent Labs  Lab 04/26/23 0913 04/27/23 0319 04/29/23 0942 04/30/23 0301 05/01/23 0323 05/02/23 0316  PROCALCITON 0.28  --   --   --   --   --   WBC 8.4   < > 7.8 8.4 10.3 9.5   < > =  values in this interval not displayed.    Liver Function Tests: Recent Labs  Lab 04/26/23 0519 04/27/23 0319 04/28/23 0522 04/29/23 0942 05/01/23 0323 05/02/23 0317  AST 51* 44* 57* 45*  --   --   ALT 23 21 24 21   --   --   ALKPHOS 82 79 90 74  --   --   BILITOT 4.7* 4.4* 3.2* 3.2*  --   --   PROT 6.7 6.3* 6.4* 5.9*  --   --   ALBUMIN 3.0* 2.8* 2.7* 3.1* 2.4* 2.7*   No results for input(s): "LIPASE", "AMYLASE" in the last 168 hours. Recent Labs  Lab 04/28/23 0522 04/29/23 0511 04/29/23 0942 04/30/23 0301 05/01/23 0323  AMMONIA 110* 142* 89* 132* 91*    ABG    Component Value Date/Time   PHART 7.47 (H) 04/27/2023 0420   PCO2ART 29 (L) 04/27/2023 0420   PO2ART 161 (H) 04/27/2023 0420   HCO3 20.6 04/27/2023 0420   ACIDBASEDEF 2.1 (H) 04/27/2023 0420   O2SAT 100 04/27/2023 0420     Coagulation Profile: Recent Labs  Lab 04/26/23 0913 04/28/23 1154  INR 1.6* 1.8*    Cardiac Enzymes: No results for input(s): "CKTOTAL", "CKMB",  "CKMBINDEX", "TROPONINI" in the last 168 hours.  HbA1C: Hgb A1c MFr Bld  Date/Time Value Ref Range Status  03/22/2023 03:06 AM 4.6 (L) 4.8 - 5.6 % Final    Comment:    (NOTE) Pre diabetes:          5.7%-6.4%  Diabetes:              >6.4%  Glycemic control for   <7.0% adults with diabetes   12/22/2022 11:01 AM 4.8 4.6 - 6.5 % Final    Comment:    Glycemic Control Guidelines for People with Diabetes:Non Diabetic:  <6%Goal of Therapy: <7%Additional Action Suggested:  >8%     CBG: Recent Labs  Lab 05/01/23 1600 05/01/23 2032 05/01/23 2337 05/02/23 0339 05/02/23 0754  GLUCAP 179* 163* 191* 180* 167*    My cc time x 32 m  Cyril Mourning MD. FCCP. [facility] Pager : [phone]  If no response to pager , please call [phone] until 7 pm After 7:00 pm call Elink  [phone]   05/02/2023

## 2023-05-02 NOTE — Progress Notes (Signed)
   05/02/23 1257  Vent Select  Invasive or Noninvasive Invasive  Adult Vent Y  Airway 7.5 mm  Placement Date/Time: 04/26/23 0945   Placed By: ICU physician  Airway Device: Oral Pharyngeal Airway  Laryngoscope Blade: 4  ETT Types: Oral  Size (mm): 7.5 mm  Cuffed: Cuffed  Insertion attempts: 1  Airway Equipment: Stylet;Video Laryngoscope  Placem...  Secured at (cm) 26 cm  Measured From Lips  Secured Location Left  Secured By Brewing technologist Repositioned Yes  Prone position No  Head position Left  Cuff Pressure (cm H2O) Green OR 18-26 CmH2O  Site Condition Drainage (Comment) (suctioned orally)  Adult Ventilator Settings  Vent Type Servo i  Humidity HME  Vent Mode PSV;CPAP  FiO2 (%) 30 %  Pressure Support (S)  10 cmH20 (Increased to 10 PS. High VE alarm continued to go off, increased to give pt more support.)  PEEP 5 cmH20  Adult Ventilator Measurements  Peak Airway Pressure 16 L/min  Mean Airway Pressure 8 cmH20  Resp Rate Spontaneous 18 br/min  Resp Rate Total 18 br/min  Spont TV 973 mL  Measured Ve 15.8 L  Total PEEP 5 cmH20  SpO2 99 %  Adult Ventilator Alarms  Alarms On Y  Ve High Alarm 18 L/min  Ve Low Alarm 4 L/min  Resp Rate High Alarm 38 br/min  Resp Rate Low Alarm 10  PEEP Low Alarm 3 cmH2O  Press High Alarm 40 cmH2O  T Apnea 20 sec(s)  VAP Prevention  HOB> 30 Degrees Y  Breath Sounds  Bilateral Breath Sounds Diminished  Vent Respiratory Assessment  Level of Consciousness Alert  Oral Suctioning/Secretions  Suction Type Oral  Suction Device Yankauer  Secretion Amount Small  Secretion Color White;Yellow  Secretion Consistency Thin  Suction Tolerance Tolerated well  Suctioning Adverse Effects None  Airway Suctioning/Secretions  Suction Type ETT  Suction Device  Catheter  Secretion Amount Small  Secretion Color White  Secretion Consistency Thin  Suction Tolerance Tolerated well  Suctioning Adverse Effects None

## 2023-05-02 NOTE — TOC Progression Note (Signed)
Transition of Care [facility]) - Progression Note    Patient Details  Name: Nathan Roberts MRN: [id number] Date of Birth: [birth year]  Transition of Care [facility]) CM/SW Contact  Darleene Cleaver, [place] Phone Number: 05/02/2023, 11:06 AM  Clinical Narrative:     Patient is still intubated.  TOC continuing to follow for any possible needs.        Expected Discharge Plan and Services                                               Social Determinants of Health (SDOH) Interventions SDOH Screenings   Food Insecurity: Patient Unable To Answer (04/26/2023)  Recent Concern: Food Insecurity - Food Insecurity Present (03/21/2023)  Housing: Patient Unable To Answer (04/26/2023)  Transportation Needs: Patient Unable To Answer (04/26/2023)  Recent Concern: Transportation Needs - Unmet Transportation Needs (03/21/2023)  Utilities: Patient Unable To Answer (04/26/2023)  Depression (PHQ2-9): Medium Risk (12/22/2022)  Social Connections: Unknown (12/09/2021)   Received from [facility], [facility]  Tobacco Use: Medium Risk (04/27/2023)    Readmission Risk Interventions     No data to display

## 2023-05-02 NOTE — Plan of Care (Signed)
Pt tolerating tube feeding set at goal; pt maintaining SpO2 >97% this shift and was able to decrease from FiO2 40% to 30% this shift Problem: Nutrition: Goal: Adequate nutrition will be maintained Outcome: Progressing   Problem: Respiratory: Goal: Ability to maintain a clear airway and adequate ventilation will improve Outcome: Progressing

## 2023-05-02 NOTE — Progress Notes (Signed)
eLink Physician-Brief Progress Note Patient Name: Nathan Roberts DOB: 1961/01/15 MRN: [id number]   Date of Service  05/02/2023  HPI/Events of Note  62 year old male with past medical history as below, which is significant for alcohol-related hepatic cirrhosis, ascites refractory to multiple paracentesis and diuretics, medical noncompliance, esophageal varices, and hepatic encephalopathy which had been well-controlled with medications.   Low-grade fever.  Ammonia level 228.  Ongoing lactulose was increased dose today.  eICU Interventions  Add on as needed Tylenol with max dose 3 g daily     Intervention Category Minor Interventions: Routine modifications to care plan (e.g. PRN medications for pain, fever)  Nathan Roberts 05/02/2023, 8:27 PM

## 2023-05-02 NOTE — Progress Notes (Signed)
   05/02/23 0831  Adult Ventilator Settings  Vent Type Servo i  Humidity HME  Vent Mode (S)  PSV;CPAP (Changed to 5/5, 30%.)  FiO2 (%) 30 %  Pressure Support 5 cmH20  PEEP 5 cmH20  Adult Ventilator Measurements  Peak Airway Pressure 11 L/min  Mean Airway Pressure 7 cmH20  Resp Rate Spontaneous 18 br/min  Resp Rate Total 18 br/min  Spont TV 778 mL  Measured Ve 15.6 L  Total PEEP 5 cmH20  SpO2 98 %  Adult Ventilator Alarms  Alarms On Y  Ve High Alarm 18 L/min  Ve Low Alarm 4 L/min  Resp Rate High Alarm 38 br/min  Resp Rate Low Alarm 10  PEEP Low Alarm 3 cmH2O  Press High Alarm 40 cmH2O  T Apnea 20 sec(s)  VAP Prevention  HOB> 30 Degrees Y  Vent Respiratory Assessment  Level of Consciousness Alert  Respiratory Pattern Regular;Unlabored   Goal: Wean as tolerated.

## 2023-05-02 NOTE — Progress Notes (Signed)
PMT no charge note.   Palliative team following peripherally, chart reviewed, discussed with nursing colleague.  Ammonia levels noted, plan for lactulose.  Remains on vent support.  Weaning trials underway.  No new inpatient palliative specific recommendations at this time.  Brother Hasson Gaspard number called unable to reach at this time.  Palliative will continue to follow peripherally and remains available for further goals of care discussions on an as-needed basis. No charge Rosalin Hawking, MD [place] palliative

## 2023-05-02 NOTE — Progress Notes (Signed)
eLink Physician-Brief Progress Note Patient Name: Nathan Roberts DOB: 02/21/1961 MRN: [id number]   Date of Service  05/02/2023  HPI/Events of Note  62 year old male with past medical history as below, which is significant for alcohol-related hepatic cirrhosis, ascites refractory to multiple paracentesis and diuretics, medical noncompliance, esophageal varices, and hepatic encephalopathy which had been well-controlled with medications.   Sputum Gram stain with abundant GPC in pairs and chains, few GNR, abundant WBC.  eICU Interventions  Polymicrobial Gram stain of unclear significance, await cultures.  Maintain p.o. ciprofloxacin.  No persistent fevers.     Intervention Category Minor Interventions: Clinical assessment - ordering diagnostic tests  Nathan Roberts 05/02/2023, 12:29 AM

## 2023-05-02 NOTE — Progress Notes (Signed)
Patient not responsive despite not receiving any sedative medications. PA Stephanie with pulmonary notified with order to check ammonia level. Ammonia level 228. Dr. Vassie Loll notified with new order. Flexiseal functioning properly but patient's diarrhea has decreased. Will continue to monitor.

## 2023-05-02 NOTE — Plan of Care (Signed)
  Problem: Clinical Measurements: Goal: Cardiovascular complication will be avoided Outcome: Progressing   Problem: Nutrition: Goal: Adequate nutrition will be maintained Outcome: Progressing   Problem: Elimination: Goal: Will not experience complications related to bowel motility Outcome: Progressing Goal: Will not experience complications related to urinary retention Outcome: Progressing   Problem: Pain Managment: Goal: General experience of comfort will improve Outcome: Progressing   Problem: Safety: Goal: Ability to remain free from injury will improve Outcome: Progressing

## 2023-05-02 NOTE — Progress Notes (Signed)
   05/02/23 1610  Vent Select  Invasive or Noninvasive Invasive  Adult Vent Y  Airway 7.5 mm  Placement Date/Time: 04/26/23 0945   Placed By: ICU physician  Airway Device: Oral Pharyngeal Airway  Laryngoscope Blade: 4  ETT Types: Oral  Size (mm): 7.5 mm  Cuffed: Cuffed  Insertion attempts: 1  Airway Equipment: Stylet;Video Laryngoscope  Placem...  Secured at (cm) 26 cm  Measured From Lips  Secured Location Center  Secured By Commercial Tube Holder  Tube Holder Repositioned Yes  Prone position No  Head position Left  Cuff Pressure (cm H2O) Green OR 18-26 CmH2O  Site Condition Drainage (Comment) (Suctioned large amts of clear, thin amts of secretions.)  Adult Ventilator Settings  Vent Type Servo i  Humidity HME  Vent Mode (S)  PRVC (Changed back to rest mode due to fatigue, pt weaned total of 8 hrs. on PSV 5-10/5.)  Vt Set 550 mL  Set Rate 12 bmp  FiO2 (%) 30 %  I Time 0.9 Sec(s)  PEEP 5 cmH20  Adult Ventilator Measurements  Peak Airway Pressure 6 L/min  Mean Airway Pressure 5 cmH20  Resp Rate Spontaneous 8 br/min  Resp Rate Total 20 br/min  Exhaled Vt 662 mL  Measured Ve 14.2 L  I:E Ratio Measured 1:2.2  Total PEEP 5 cmH20  SpO2 95 %  Adult Ventilator Alarms  Alarms On Y  Ve High Alarm 22 L/min  Ve Low Alarm 4 L/min  Resp Rate High Alarm 38 br/min  Resp Rate Low Alarm 10  PEEP Low Alarm 3 cmH2O  Press High Alarm 40 cmH2O  T Apnea 20 sec(s)  VAP Prevention  HME changed Yes  HOB> 30 Degrees Y  Breath Sounds  Bilateral Breath Sounds Rhonchi  R Upper  Breath Sounds Rhonchi  L Upper Breath Sounds Rhonchi  R Lower Breath Sounds Diminished  L Lower Breath Sounds Diminished  Vent Respiratory Assessment  Level of Consciousness Alert  Respiratory Pattern Labored (Ended vent wean due to fatigue, increased WOB.)  Respiratory Symptoms Short of breath with rest  Patient Tolerance Tolerated well  Oral Suctioning/Secretions  Suction Type Oral  Suction Device Yankauer   Secretion Amount Copious  Secretion Color Clear  Secretion Consistency Thin  Suction Tolerance Tolerated well  Suctioning Adverse Effects None  Airway Suctioning/Secretions  Suction Type ETT  Suction Device  Catheter  Secretion Amount Moderate  Secretion Color White;Tan  Secretion Consistency Thin;Thick  Suction Tolerance Tolerated well  Suctioning Adverse Effects None

## 2023-05-03 ENCOUNTER — Other Ambulatory Visit: Payer: Self-pay | Admitting: Family Medicine

## 2023-05-03 ENCOUNTER — Inpatient Hospital Stay (HOSPITAL_COMMUNITY): Payer: 59

## 2023-05-03 DIAGNOSIS — K7682 Hepatic encephalopathy: Secondary | ICD-10-CM | POA: Diagnosis not present

## 2023-05-03 DIAGNOSIS — R579 Shock, unspecified: Secondary | ICD-10-CM | POA: Diagnosis present

## 2023-05-03 DIAGNOSIS — K7031 Alcoholic cirrhosis of liver with ascites: Secondary | ICD-10-CM | POA: Diagnosis not present

## 2023-05-03 DIAGNOSIS — J9601 Acute respiratory failure with hypoxia: Secondary | ICD-10-CM | POA: Diagnosis not present

## 2023-05-03 LAB — HEPATIC FUNCTION PANEL
ALT: 29 U/L (ref 0–44)
AST: 56 U/L — ABNORMAL HIGH (ref 15–41)
Albumin: 2.7 g/dL — ABNORMAL LOW (ref 3.5–5.0)
Alkaline Phosphatase: 124 U/L (ref 38–126)
Bilirubin, Direct: 0.8 mg/dL — ABNORMAL HIGH (ref 0.0–0.2)
Indirect Bilirubin: 1.7 mg/dL — ABNORMAL HIGH (ref 0.3–0.9)
Total Bilirubin: 2.5 mg/dL — ABNORMAL HIGH (ref 0.3–1.2)
Total Protein: 6.2 g/dL — ABNORMAL LOW (ref 6.5–8.1)

## 2023-05-03 LAB — CBC WITH DIFFERENTIAL/PLATELET
Abs Immature Granulocytes: 0.06 10*3/uL (ref 0.00–0.07)
Basophils Absolute: 0 10*3/uL (ref 0.0–0.1)
Basophils Relative: 1 %
Eosinophils Absolute: 0.4 10*3/uL (ref 0.0–0.5)
Eosinophils Relative: 4 %
HCT: 32.4 % — ABNORMAL LOW (ref 39.0–52.0)
Hemoglobin: 10.2 g/dL — ABNORMAL LOW (ref 13.0–17.0)
Immature Granulocytes: 1 %
Lymphocytes Relative: 14 %
Lymphs Abs: 1.2 10*3/uL (ref 0.7–4.0)
MCH: 32.4 pg (ref 26.0–34.0)
MCHC: 31.5 g/dL (ref 30.0–36.0)
MCV: 102.9 fL — ABNORMAL HIGH (ref 80.0–100.0)
Monocytes Absolute: 1.7 10*3/uL — ABNORMAL HIGH (ref 0.1–1.0)
Monocytes Relative: 20 %
Neutro Abs: 5.1 10*3/uL (ref 1.7–7.7)
Neutrophils Relative %: 60 %
Platelets: 96 10*3/uL — ABNORMAL LOW (ref 150–400)
RBC: 3.15 MIL/uL — ABNORMAL LOW (ref 4.22–5.81)
RDW: 16.7 % — ABNORMAL HIGH (ref 11.5–15.5)
WBC: 8.5 10*3/uL (ref 4.0–10.5)
nRBC: 0 % (ref 0.0–0.2)

## 2023-05-03 LAB — GLUCOSE, CAPILLARY
Glucose-Capillary: 178 mg/dL — ABNORMAL HIGH (ref 70–99)
Glucose-Capillary: 137 mg/dL — ABNORMAL HIGH (ref 70–99)
Glucose-Capillary: 115 mg/dL — ABNORMAL HIGH (ref 70–99)
Glucose-Capillary: 109 mg/dL — ABNORMAL HIGH (ref 70–99)
Glucose-Capillary: 103 mg/dL — ABNORMAL HIGH (ref 70–99)
Glucose-Capillary: 134 mg/dL — ABNORMAL HIGH (ref 70–99)

## 2023-05-03 LAB — BASIC METABOLIC PANEL
Anion gap: 12 (ref 5–15)
BUN: 47 mg/dL — ABNORMAL HIGH (ref 8–23)
CO2: 17 mmol/L — ABNORMAL LOW (ref 22–32)
Calcium: 8.4 mg/dL — ABNORMAL LOW (ref 8.9–10.3)
Chloride: 114 mmol/L — ABNORMAL HIGH (ref 98–111)
Creatinine, Ser: 1.31 mg/dL — ABNORMAL HIGH (ref 0.61–1.24)
GFR, Estimated: >60 mL/min (ref >60)
Glucose, Bld: 103 mg/dL — ABNORMAL HIGH (ref 70–99)
Potassium: 4.9 mmol/L (ref 3.5–5.1)
Sodium: 143 mmol/L (ref 135–145)

## 2023-05-03 LAB — MAGNESIUM: Magnesium: 2.8 mg/dL — ABNORMAL HIGH (ref 1.7–2.4)

## 2023-05-03 LAB — LACTIC ACID, PLASMA
Lactic Acid, Venous: 8.6 mmol/L (ref 0.5–1.9)
Lactic Acid, Venous: >9 mmol/L (ref 0.5–1.9)

## 2023-05-03 LAB — RENAL FUNCTION PANEL
Albumin: 2.7 g/dL — ABNORMAL LOW (ref 3.5–5.0)
Anion gap: 8 (ref 5–15)
BUN: 43 mg/dL — ABNORMAL HIGH (ref 8–23)
CO2: 20 mmol/L — ABNORMAL LOW (ref 22–32)
Calcium: 8.1 mg/dL — ABNORMAL LOW (ref 8.9–10.3)
Chloride: 111 mmol/L (ref 98–111)
Creatinine, Ser: 0.67 mg/dL (ref 0.61–1.24)
GFR, Estimated: >60 mL/min (ref >60)
Glucose, Bld: 180 mg/dL — ABNORMAL HIGH (ref 70–99)
Phosphorus: 2.2 mg/dL — ABNORMAL LOW (ref 2.5–4.6)
Potassium: 5 mmol/L (ref 3.5–5.1)
Sodium: 139 mmol/L (ref 135–145)

## 2023-05-03 LAB — PHOSPHORUS: Phosphorus: 2.3 mg/dL — ABNORMAL LOW (ref 2.5–4.6)

## 2023-05-03 MED ORDER — SODIUM CHLORIDE 0.9 % IV SOLN
3.0000 g | Freq: Four times a day (QID) | INTRAVENOUS | Status: DC
  Administered 2023-05-03: 3 g via INTRAVENOUS
  Filled 2023-05-03: qty 8

## 2023-05-03 MED ORDER — SODIUM CHLORIDE 0.9 % IV SOLN
250.0000 mL | INTRAVENOUS | Status: DC
  Administered 2023-05-04: 250 mL via INTRAVENOUS

## 2023-05-03 MED ORDER — NOREPINEPHRINE 4 MG/250ML-% IV SOLN
0.0000 ug/min | INTRAVENOUS | Status: DC
  Administered 2023-05-03: 2 ug/min via INTRAVENOUS
  Administered 2023-05-03: 35 ug/min via INTRAVENOUS
  Administered 2023-05-03: 11 ug/min via INTRAVENOUS
  Filled 2023-05-03 (×4): qty 250

## 2023-05-03 MED ORDER — ACETAMINOPHEN 160 MG/5ML PO SOLN
650.0000 mg | Freq: Once | ORAL | Status: AC
  Administered 2023-05-03: 650 mg
  Filled 2023-05-03: qty 20.3

## 2023-05-03 MED ORDER — PIPERACILLIN-TAZOBACTAM 3.375 G IVPB
3.3750 g | Freq: Three times a day (TID) | INTRAVENOUS | Status: DC
  Administered 2023-05-03 – 2023-05-04 (×3): 3.375 g via INTRAVENOUS
  Filled 2023-05-03 (×3): qty 50

## 2023-05-03 MED ORDER — SODIUM BICARBONATE 8.4 % IV SOLN
50.0000 meq | Freq: Once | INTRAVENOUS | Status: AC
  Administered 2023-05-03: 50 meq via INTRAVENOUS
  Filled 2023-05-03: qty 50

## 2023-05-03 MED ORDER — LACTULOSE ENEMA
300.0000 mL | Freq: Three times a day (TID) | ORAL | Status: DC
  Administered 2023-05-03 (×2): 300 mL via RECTAL
  Filled 2023-05-03 (×5): qty 300

## 2023-05-03 MED ORDER — VITAL AF 1.2 CAL PO LIQD
1000.0000 mL | ORAL | Status: DC
  Administered 2023-05-03: 1000 mL

## 2023-05-03 MED ORDER — OXIDIZED CELLULOSE EX PADS
1.0000 | MEDICATED_PAD | Freq: Once | CUTANEOUS | Status: DC
  Filled 2023-05-03: qty 1

## 2023-05-03 MED ORDER — ALBUMIN HUMAN 25 % IV SOLN
12.5000 g | Freq: Once | INTRAVENOUS | Status: AC
  Administered 2023-05-03: 12.5 g via INTRAVENOUS
  Filled 2023-05-03: qty 50

## 2023-05-03 NOTE — Consult Note (Signed)
[facility]   [facility] Inpatient Consult   05/03/2023  TAD FANCHER 1961-02-14 [id number]  [facility] [[facility]]  Accountable Care Organization [ACO] Patient: [name]  Primary Care Provider: Clayborne Dana, NP with [place] [facility] provider is listed for the transition of care follow up appointments and calls   [facility] Liaison screened the patient remotely at [facility]    The patient was screened for 30 day readmission hospitalization with noted extreme risk score for unplanned readmission risk 3 hospital admissions in 6 months. *Reviewed for LOS, patient remains at ICU level of care at this time on ventilator.  The patient was assessed for potential [facility]) Care Management service needs for post hospital transition for care coordination.    Plan: [facility] Liaison will continue to follow progress and disposition to asess for post hospital community care coordination/management needs.  Referral request for community care coordination: pending level of care and  disposition   Community Care Management/Population Health does not replace or interfere with any arrangements made by the Inpatient Transition of Care team.   For questions contact:   Charlesetta Shanks, RN, BSN, CCM [place]  [facility], [facility] Liaison Direct Dial: [phone] or secure chat Website: [email or web address]

## 2023-05-03 NOTE — Plan of Care (Signed)
  Problem: Nutrition: Goal: Adequate nutrition will be maintained Outcome: Progressing   Problem: Elimination: Goal: Will not experience complications related to bowel motility Outcome: Progressing Goal: Will not experience complications related to urinary retention Outcome: Progressing   Problem: Pain Managment: Goal: General experience of comfort will improve Outcome: Progressing   Problem: Safety: Goal: Ability to remain free from injury will improve Outcome: Progressing   Problem: Skin Integrity: Goal: Risk for impaired skin integrity will decrease Outcome: Progressing   Problem: Education: Goal: Knowledge of General Education information will improve Description: Including pain rating scale, medication(s)/side effects and non-pharmacologic comfort measures Outcome: Not Progressing   Problem: Activity: Goal: Risk for activity intolerance will decrease Outcome: Not Progressing

## 2023-05-03 NOTE — Progress Notes (Addendum)
NAME:  Nathan Roberts, MRN:  [id number], DOB:  [birth year], LOS: 9 ADMISSION DATE:  04/26/2023, CONSULTATION DATE: 9/26 REFERRING MD: Dr. Imogene Burn, CHIEF COMPLAINT: Hepatic encephalopathy  History of Present Illness:  Patient is encephalopathic and/or intubated; therefore, history has been obtained from chart review.  62 year old male with past medical history as below, which is significant for alcohol-related hepatic cirrhosis, ascites refractory to multiple paracentesis and diuretics, medical noncompliance, esophageal varices, and hepatic encephalopathy which had been well-controlled with medications.  Recently admitted to [facility] in late August for ascites.  He had a endoscopy at that time showing grade 1 esophageal varices.  He has been followed by both [facility], and [facility]. Last note from [facility] recommended Lasix and possibly spiro for volume. Cipro 500 daily for SBP ppx, and sodium restricted diet. He was felt to be a poor candidate for transplant due to social issues, history of non-compliance, and functional status. TIPs, however, was not considered to be off the table as his HE was able to be controlled with medications. Patient has declined TIPS in the past. Most recent paracentesis 9/20 during which 13L was removed.   He presented to [facility] ED 9/24 via EMS after he was noted to exhibit unusual behavior at the grocery store. He was oriented to self in the ED, but was otherwise confused. Ammonia level was noted to be high in correlation to his history of hepatic encephalopathy. He received a dose of lactulose in the ED and was admitted to the hospitalists for treatment of HE with lactulose and decompensated cirrhosis with diuretics and SBP ppx. However, in the overnight hours 9/25-26 he became increasingly lethargic and ammonia level climbed despite treatment. He was transferred to the ICU minimally responsive with poor protection of his airway and ultimately vomited. PCCM consulted  for consideration of intubation.   Pertinent  Medical History   has a past medical history of Allergy, Arthritis, Carpal tunnel syndrome, Diabetes mellitus without complication (HCC), Esophageal varices (HCC), GERD (gastroesophageal reflux disease), GI bleed, Hyperlipidemia, Hypertension, Pancreas divisum (03/22/2023), Portal hypertensive gastropathy (HCC), and Pre-diabetes.   Significant Hospital Events: Including procedures, antibiotic start and stop dates in addition to other pertinent events   9/24 presented with confusion. Admitted for treatment of HE 9/26 tx to ICU for lethargy, vomiting, aspiration concern. Intubated.  9/28 failed weaning 9/28-over 9 L paracentesis-sent for analysis 9/29 head CT negative 10/1 awake and following commands, extubation deferred due to increased secretions 10/2 low-grade fever, secretions persist, poor mental status  Interim History / Subjective:   Febrile 102 this morning Tachycardic Only 200 cc stool output 1.3 L urine output but remains positive balance 2 L  Objective   Blood pressure (!) 161/83, pulse (!) 107, temperature (!) 102.1 F (38.9 C), temperature source Axillary, resp. rate (!) 24, height 5\' 8"  (1.727 m), weight 81.2 kg, SpO2 100%.    Vent Mode: PRVC FiO2 (%):  [30 %] 30 % Set Rate:  [12 bmp] 12 bmp Vt Set:  [550 mL] 550 mL PEEP:  [5 cmH20] 5 cmH20 Pressure Support:  [5 cmH20-10 cmH20] 10 cmH20 Plateau Pressure:  [17 cmH20-21 cmH20] 17 cmH20   Intake/Output Summary (Last 24 hours) at 05/03/2023 0848 Last data filed at 05/03/2023 0549 Gross per 24 hour  Intake 2807.16 ml  Output 1455 ml  Net 1352.16 ml   Filed Weights   05/01/23 0500 05/02/23 0500 05/03/23 0409  Weight: 74.5 kg 74.6 kg 81.2 kg    Examination:  General: Middle-aged man, orally intubated, no distress HENT: Moist oral mucosa, no pallor or icterus Lungs: Clear breath sounds bilateral, no accessory muscle use, mild secretions Cardiovascular: S1-S2  Abdomen:  Fluid thrill, soft nontender, umbilical hernia Extremities: No edema Neuro: Eyes half open, spontaneous limb movements but does not follow commands  Labs show mild hyperkalemia, no leukocytosis, stable anemia and thrombocytopenia Chest x-ray 10/3 shows no new infiltrates or effusions  Resolved Hospital Problem list     Assessment & Plan:   Acute hypoxemic respiratory failure  -Spontaneous breathing trials -poor mental status and secretions remain barriers to extubation  Aspiration pneumonia -New fever 10/2 -cover for HAP, will change to  unasyn , await ID of GPC chains?  Strep versus Enterococcus -No evidence of SBP   Acute hepatic encephalopathy secondary to alcoholic cirrhosis -Continue rifaximin, stool output has decreased in spite of increasing lactulose to every 6, will add lactulose enema- - fent int prn goal RASS 0 , seems to drop blood pressure with Precedex -Control fevers aggressively with Tylenol up to 2 g/day and cooling blankets   Alcoholic cirrhosis with meld of 18, MELD sodium of 22 Portal hypertension Refractory ascites -S/p paracentesis for 9.2 L -hold further para -Continue  Lasix as blood pressure permits, may have to hold Aldactone if potassium rises further -Not a transplant candidate Appreciate GI recommendations  Hypertension -Discontinued propranolol because of hypotension  Diabetes -Continue SSI   Goals of care discussions ongoing -Appreciate palliative care involvement   Best Practice (right click and "Reselect all SmartList Selections" daily)   Diet/type: tubefeeds DVT prophylaxis: not indicated GI prophylaxis: PPI Lines: N/A Foley:  N/A Code Status:  full code Last date of multidisciplinary goals of care discussion -updated wife [name] 10/1 -she deferred to other family members, son [name] is point of contact now, updated 10/2  Labs   CBC: Recent Labs  Lab 04/26/23 0913 04/27/23 0319 04/28/23 0522 04/29/23 1610 04/30/23 0301  05/01/23 0323 05/02/23 0316 05/03/23 0306  WBC 8.4 6.9   < > 7.8 8.4 10.3 9.5 8.5  NEUTROABS 6.0 3.9  --  4.6  --   --  6.5 5.1  HGB 11.5* 10.3*   < > 10.7* 10.4* 10.7* 10.1* 10.2*  HCT 35.5* 32.4*   < > 33.8* 32.9* 34.0* 33.0* 32.4*  MCV 99.7 100.9*   < > 102.1* 102.5* 104.6* 105.8* 102.9*  PLT 109* 107*   < > 99* 106* 64* 85* 96*   < > = values in this interval not displayed.    Basic Metabolic Panel: Recent Labs  Lab 04/28/23 1647 04/29/23 0511 04/29/23 9604 04/30/23 0301 05/01/23 0323 05/02/23 0316 05/02/23 0317 05/03/23 0306  NA  --   --  138 140 136  --  139 139  K  --   --  3.6 3.9 4.4  --  4.7 5.0  CL  --   --  111 114* 112*  --  111 111  CO2  --   --  21* 21* 18*  --  20* 20*  GLUCOSE  --   --  135* 127* 157*  --  178* 180*  BUN  --   --  23 22 28*  --  41* 43*  CREATININE  --   --  0.52* 0.39* 0.52*  --  0.60* 0.67  CALCIUM  --   --  8.5* 8.2* 8.0*  --  8.2* 8.1*  MG 2.2 2.3  --   --  2.5* 2.6*  --  2.8*  PHOS 2.5 1.9*  --  2.6 2.3* 2.5 2.4* 2.2*  2.3*   GFR: Estimated Creatinine Clearance: 90.3 mL/min (by C-G formula based on SCr of 0.67 mg/dL). Recent Labs  Lab 04/26/23 0913 04/27/23 0319 04/30/23 0301 05/01/23 0323 05/02/23 0316 05/03/23 0306  PROCALCITON 0.28  --   --   --   --   --   WBC 8.4   < > 8.4 10.3 9.5 8.5   < > = values in this interval not displayed.    Liver Function Tests: Recent Labs  Lab 04/27/23 0319 04/28/23 0522 04/29/23 0942 05/01/23 0323 05/02/23 0317 05/03/23 0306 05/03/23 0347  AST 44* 57* 45*  --   --   --  56*  ALT 21 24 21   --   --   --  29  ALKPHOS 79 90 74  --   --   --  124  BILITOT 4.4* 3.2* 3.2*  --   --   --  2.5*  PROT 6.3* 6.4* 5.9*  --   --   --  6.2*  ALBUMIN 2.8* 2.7* 3.1* 2.4* 2.7* 2.7* 2.7*   No results for input(s): "LIPASE", "AMYLASE" in the last 168 hours. Recent Labs  Lab 04/29/23 0511 04/29/23 0942 04/30/23 0301 05/01/23 0323 05/02/23 1133  AMMONIA 142* 89* 132* 91* 228*    ABG     Component Value Date/Time   PHART 7.47 (H) 04/27/2023 0420   PCO2ART 29 (L) 04/27/2023 0420   PO2ART 161 (H) 04/27/2023 0420   HCO3 20.6 04/27/2023 0420   ACIDBASEDEF 2.1 (H) 04/27/2023 0420   O2SAT 100 04/27/2023 0420     Coagulation Profile: Recent Labs  Lab 04/26/23 0913 04/28/23 1154  INR 1.6* 1.8*    Cardiac Enzymes: No results for input(s): "CKTOTAL", "CKMB", "CKMBINDEX", "TROPONINI" in the last 168 hours.  HbA1C: Hgb A1c MFr Bld  Date/Time Value Ref Range Status  03/22/2023 03:06 AM 4.6 (L) 4.8 - 5.6 % Final    Comment:    (NOTE) Pre diabetes:          5.7%-6.4%  Diabetes:              >6.4%  Glycemic control for   <7.0% adults with diabetes   12/22/2022 11:01 AM 4.8 4.6 - 6.5 % Final    Comment:    Glycemic Control Guidelines for People with Diabetes:Non Diabetic:  <6%Goal of Therapy: <7%Additional Action Suggested:  >8%     CBG: Recent Labs  Lab 05/02/23 1517 05/02/23 2001 05/02/23 2317 05/03/23 0347 05/03/23 0747  GLUCAP 157* 176* 134* 178* 137*    My cc time x 34 m  Cyril Mourning MD. FCCP. [place] [facility] Pager : [phone]  If no response to pager , please call [phone] until 7 pm After 7:00 pm call Elink  [phone]   05/03/2023

## 2023-05-03 NOTE — Progress Notes (Signed)
Initial Nutrition Assessment  DOCUMENTATION CODES:   Severe malnutrition in context of chronic illness  INTERVENTION:  - Adjust to new tube feed formula due to formula shortage of Vital 1.5  Vital 1.2 at 70 ml/h (1680 ml per day)  Provides 2016 kcal, 126 gm protein, 1362 ml free water daily   - FWF per CCM/MD.    - Monitor weight trends.    NUTRITION DIAGNOSIS:   Severe Malnutrition related to chronic illness (alcohol-related hepatic cirrhosis) as evidenced by severe fat depletion, severe muscle depletion. *ongoing  GOAL:   Patient will meet greater than or equal to 90% of their needs *met with TF  MONITOR:   Vent status, Labs, Weight trends  REASON FOR ASSESSMENT:   Consult Assessment of nutrition requirement/status  ASSESSMENT:   62 year old male with PMH significant for alcohol-related hepatic cirrhosis, ascites refractory to multiple paracentesis and diuretics, esophageal varices, and hepatic encephalopathy. Had a recent admission in late August for ascites who presented after he was noted to exhibit unusual behavior at the grocery store. Admitted for hepatic encephalopathy.  May 01, 2023: Admit  9/26: Intubated 9/27: Vital 1.5 started at 43mL/hr 9/29: Vital 1.5 increased to goal of 16mL/hr  Patient intubated and sedated at time of visit, no family or visitors at bedside.  TF infusing at goal of 2mL/hr.   He continues to have diarrhea although has remained on lactulose. Continues to receive Banatrol BID. However, Levophed now ordered to start this afternoon. Will discontinue Banatrol for now.  Palliative care continues to follow with patient however family difficult to reach.    Admit weight: 174# Current weight: 179#  Medications reviewed and include: Banatrol BID, Lasix, Insulin  Labs reviewed:  Creatinine 1.31 Phosphorus 2.2 Magnesium 2.8 Levophed @ 2 mcg/min   Diet Order:   Diet Order             Diet NPO time specified  Diet effective now                    EDUCATION NEEDS:  Not appropriate for education at this time  Skin:  Skin Assessment: Skin Integrity Issues: Skin Integrity Issues:: Other (Comment) Other: Several skin tears; Incontinence Associated Dermatitis on Bilateral Buttocks  Last BM:  10/3 - type 7 - rectal tube  Height:  Ht Readings from Last 1 Encounters:  04/26/23 5\' 8"  (1.727 m)   Weight:  Wt Readings from Last 1 Encounters:  05/03/23 81.2 kg    BMI:  Body mass index is 27.22 kg/m.  Estimated Nutritional Needs:  Kcal:  1950-2350 kcals Protein:  115-135 grams Fluid:  >/= 1.9L    Shelle Iron RD, LDN For contact information, refer to [facility].

## 2023-05-03 NOTE — Progress Notes (Signed)
He has an unexplained lactic acidosis today, acidotic breathing. He is on pressors and had a fever early morning which has now defervesced Antibiotics were broadened to Zosyn His belly is distended with fluid thrill but is nontender, we gave him albumin in anticipation and but holding off further paracentesis due to ongoing hepatic encephalopathy will give him more albumin tonight. Chest x-ray does not show any free air but will proceed with CT abdomen/pelvis to rule out acute abdomen which may be masked by ascites His son [name] was updated and critical nature of illness discussed  Additional critical care time was 30 minutes  Lyndel Sarate V. Vassie Loll MD

## 2023-05-03 NOTE — Procedures (Signed)
Central Venous Catheter Insertion Procedure Note  Nathan Roberts  [id number]  [birth year]  Date:05/03/23  Time:2:06 PM   Provider Performing:Nathan Roberts Judie Petit Pecola Leisure   Procedure: Insertion of Non-tunneled Central Venous [id number]) with US guidance (57322)   Indication(s) Medication administration and Difficult access  Consent Risks of the procedure as well as the alternatives and risks of each were explained to the patient and/or caregiver.  Consent for the procedure was obtained and is signed in the bedside chart. Consent obtained verbally via phone from patient's son, Nathan Roberts.  Anesthesia Topical only with 1% lidocaine   Timeout Verified patient identification, verified procedure, site/side was marked, verified correct patient position, special equipment/implants available, medications/allergies/relevant history reviewed, required imaging and test results available.  Sterile Technique Maximal sterile technique including full sterile barrier drape, hand hygiene, sterile gown, sterile gloves, mask, hair covering, sterile ultrasound probe cover (if used).  Procedure Description Area of catheter insertion was cleaned with chlorhexidine and draped in sterile fashion.  With real-time ultrasound guidance a central venous catheter was placed into the left internal jugular vein. Nonpulsatile blood flow and easy flushing noted in all ports.  The catheter was sutured in place and sterile dressing applied.    Complications/Tolerance None; patient tolerated the procedure well. Chest X-ray is ordered to verify placement for internal jugular or subclavian cannulation.   Chest x-ray is not ordered for femoral cannulation.  EBL Minimal  Specimen(s) None  Nathan Roberts, [place] [facility] 05/03/23 2:07 PM  Please see [email or web address] for pager details.  From 7A-7P if no response, please call [phone] After hours, please call ELink [phone]

## 2023-05-03 NOTE — Progress Notes (Signed)
Date and time results received: 05/03/23 1723 Test: Lactic Acid  Critical Value: 8.6  Name of Provider Notified: Dr. Vassie Loll notified  Orders Received? Or Actions Taken?: MD awared. Awaiting orders

## 2023-05-03 NOTE — Progress Notes (Addendum)
Pharmacy Antibiotic Note  Nathan Roberts is a 62 y.o. male admitted on 05/03/23 with pneumonia. Pharmacy has been consulted for Zosyn dosing.  Patient has been on Cipro therapy x6 continuous days (since 9/27) for aspiration pneumonia and SBP prophylaxis. Respiratory cultures from 10/1 growing abundant GPC in pairs/chains, few GNRs. Patient with new fever 10/3 AM (max 103.1). HR also increased to 110s-130s, BP low at 70s-80s/40s-50s--Patient now meeting sepsis criteria. Pharmacy originally consulted to start Unasyn therapy, however, given increased HR, low BP, and presence of GNR in respiratory cultures, will escalate to Zosyn.   Plan: Discontinue Cipro Start Zosyn 3.375g q8hrs (extended infusion) Monitor renal function and overall clinical picture De-escalate antibiotics as able  Height: 5\' 8"  (172.7 cm) Weight: 81.2 kg (179 lb 0.2 oz) IBW/kg (Calculated) : 68.4  Temp (24hrs), Avg:99.6 F (37.6 C), Min:98.6 F (37 C), Max:102.1 F (38.9 C)  Recent Labs  Lab 04/29/23 0942 04/30/23 0301 05/01/23 0323 05/02/23 0316 05/02/23 0317 05/03/23 0306  WBC 7.8 8.4 10.3 9.5  --  8.5  CREATININE 0.52* 0.39* 0.52*  --  0.60* 0.67    Estimated Creatinine Clearance: 90.3 mL/min (by C-G formula based on SCr of 0.67 mg/dL).    Allergies  Allergen Reactions   Cephalexin Rash   Doxycycline Rash   Latex Hives and Rash    Antimicrobials this admission: 9/25 Cipro x1; 9/27 >> 10/3 10/3 Unasyn x1 10/3 Zosyn >>  Dose adjustments this admission: N/A  Microbiology results: 9/26 MRSA PCR: negative 9/28 peritoneal fluid gram stain: no organisms seen 10/1 respiratory Cx: GPC in pairs/chains, few GNRs   Thank you for allowing pharmacy to be a part of this patient's care.  Cherylin Mylar, PharmD Clinical Pharmacist  10/3/20249:24 AM

## 2023-05-04 ENCOUNTER — Inpatient Hospital Stay (HOSPITAL_COMMUNITY): Payer: 59

## 2023-05-04 DIAGNOSIS — K7200 Acute and subacute hepatic failure without coma: Secondary | ICD-10-CM

## 2023-05-04 DIAGNOSIS — K7682 Hepatic encephalopathy: Secondary | ICD-10-CM | POA: Diagnosis not present

## 2023-05-04 DIAGNOSIS — N179 Acute kidney failure, unspecified: Secondary | ICD-10-CM

## 2023-05-04 DIAGNOSIS — R578 Other shock: Secondary | ICD-10-CM

## 2023-05-04 DIAGNOSIS — Z515 Encounter for palliative care: Secondary | ICD-10-CM

## 2023-05-04 LAB — PROTIME-INR
INR: 3.3 — ABNORMAL HIGH (ref 0.8–1.2)
Prothrombin Time: 33.7 s — ABNORMAL HIGH (ref 11.4–15.2)

## 2023-05-04 LAB — BASIC METABOLIC PANEL
Anion gap: 20 — ABNORMAL HIGH (ref 5–15)
Anion gap: >20 — ABNORMAL HIGH (ref 5–15)
BUN: 55 mg/dL — ABNORMAL HIGH (ref 8–23)
BUN: 56 mg/dL — ABNORMAL HIGH (ref 8–23)
CO2: 10 mmol/L — ABNORMAL LOW (ref 22–32)
CO2: 10 mmol/L — ABNORMAL LOW (ref 22–32)
Calcium: 8.8 mg/dL — ABNORMAL LOW (ref 8.9–10.3)
Calcium: 8.6 mg/dL — ABNORMAL LOW (ref 8.9–10.3)
Chloride: 110 mmol/L (ref 98–111)
Chloride: 107 mmol/L (ref 98–111)
Creatinine, Ser: 2.15 mg/dL — ABNORMAL HIGH (ref 0.61–1.24)
Creatinine, Ser: 2.29 mg/dL — ABNORMAL HIGH (ref 0.61–1.24)
GFR, Estimated: 34 mL/min — ABNORMAL LOW (ref >60)
GFR, Estimated: 31 mL/min — ABNORMAL LOW (ref >60)
Glucose, Bld: 168 mg/dL — ABNORMAL HIGH (ref 70–99)
Glucose, Bld: 52 mg/dL — ABNORMAL LOW (ref 70–99)
Potassium: 7.3 mmol/L (ref 3.5–5.1)
Potassium: 7.4 mmol/L (ref 3.5–5.1)
Sodium: 140 mmol/L (ref 135–145)
Sodium: 143 mmol/L (ref 135–145)

## 2023-05-04 LAB — CBC WITH DIFFERENTIAL/PLATELET
Abs Immature Granulocytes: 0.33 10*3/uL — ABNORMAL HIGH (ref 0.00–0.07)
Basophils Absolute: 0 10*3/uL (ref 0.0–0.1)
Basophils Relative: 0 %
Eosinophils Absolute: 0 10*3/uL (ref 0.0–0.5)
Eosinophils Relative: 0 %
HCT: 43.1 % (ref 39.0–52.0)
Hemoglobin: 13.1 g/dL (ref 13.0–17.0)
Immature Granulocytes: 1 %
Lymphocytes Relative: 6 %
Lymphs Abs: 1.6 10*3/uL (ref 0.7–4.0)
MCH: 32.8 pg (ref 26.0–34.0)
MCHC: 30.4 g/dL (ref 30.0–36.0)
MCV: 108 fL — ABNORMAL HIGH (ref 80.0–100.0)
Monocytes Absolute: 3.6 10*3/uL — ABNORMAL HIGH (ref 0.1–1.0)
Monocytes Relative: 15 %
Neutro Abs: 18.5 10*3/uL — ABNORMAL HIGH (ref 1.7–7.7)
Neutrophils Relative %: 78 %
Platelets: 94 10*3/uL — ABNORMAL LOW (ref 150–400)
RBC: 3.99 MIL/uL — ABNORMAL LOW (ref 4.22–5.81)
RDW: 19.5 % — ABNORMAL HIGH (ref 11.5–15.5)
WBC: 24.1 10*3/uL — ABNORMAL HIGH (ref 4.0–10.5)
nRBC: 0.5 % — ABNORMAL HIGH (ref 0.0–0.2)

## 2023-05-04 LAB — CBC
HCT: 39.2 % (ref 39.0–52.0)
Hemoglobin: 11.4 g/dL — ABNORMAL LOW (ref 13.0–17.0)
MCH: 32.9 pg (ref 26.0–34.0)
MCHC: 29.1 g/dL — ABNORMAL LOW (ref 30.0–36.0)
MCV: 113 fL — ABNORMAL HIGH (ref 80.0–100.0)
Platelets: 115 10*3/uL — ABNORMAL LOW (ref 150–400)
RBC: 3.47 MIL/uL — ABNORMAL LOW (ref 4.22–5.81)
RDW: 18 % — ABNORMAL HIGH (ref 11.5–15.5)
WBC: 24.5 10*3/uL — ABNORMAL HIGH (ref 4.0–10.5)
nRBC: 0.2 % (ref 0.0–0.2)

## 2023-05-04 LAB — GLUCOSE, CAPILLARY
Glucose-Capillary: 138 mg/dL — ABNORMAL HIGH (ref 70–99)
Glucose-Capillary: 35 mg/dL — CL (ref 70–99)
Glucose-Capillary: 38 mg/dL — CL (ref 70–99)
Glucose-Capillary: 74 mg/dL (ref 70–99)

## 2023-05-04 LAB — BLOOD GAS, ARTERIAL
Acid-base deficit: 20.9 mmol/L — ABNORMAL HIGH (ref 0.0–2.0)
Bicarbonate: 7.7 mmol/L — ABNORMAL LOW (ref 20.0–28.0)
Drawn by: 23281
FIO2: 30 %
MECHVT: 550 mL
O2 Saturation: 99.2 %
PEEP: 5 cmH2O
Patient temperature: 36.3
RATE: 12 {breaths}/min
pCO2 arterial: 25 mm[Hg] — ABNORMAL LOW (ref 32–48)
pH, Arterial: 7.09 — CL (ref 7.35–7.45)
pO2, Arterial: 92 mm[Hg] (ref 83–108)

## 2023-05-04 LAB — PREPARE RBC (CROSSMATCH)

## 2023-05-04 LAB — LACTIC ACID, PLASMA: Lactic Acid, Venous: >9 mmol/L (ref 0.5–1.9)

## 2023-05-04 LAB — PHOSPHORUS: Phosphorus: 9.8 mg/dL — ABNORMAL HIGH (ref 2.5–4.6)

## 2023-05-04 LAB — AMMONIA: Ammonia: 555 umol/L — ABNORMAL HIGH (ref 9–35)

## 2023-05-04 LAB — MAGNESIUM
Magnesium: 3.5 mg/dL — ABNORMAL HIGH (ref 1.7–2.4)
Magnesium: 3.2 mg/dL — ABNORMAL HIGH (ref 1.7–2.4)

## 2023-05-04 LAB — APTT: aPTT: 68 s — ABNORMAL HIGH (ref 24–36)

## 2023-05-04 SURGERY — ESOPHAGOGASTRODUODENOSCOPY (EGD) WITH PROPOFOL
Anesthesia: Monitor Anesthesia Care

## 2023-05-04 SURGERY — EGD (ESOPHAGOGASTRODUODENOSCOPY)
Anesthesia: Moderate Sedation

## 2023-05-04 MED ORDER — MORPHINE 100MG IN NS 100ML (1MG/ML) PREMIX INFUSION
0.0000 mg/h | INTRAVENOUS | Status: DC
  Administered 2023-05-04: 5 mg/h via INTRAVENOUS
  Filled 2023-05-04: qty 100

## 2023-05-04 MED ORDER — MORPHINE BOLUS VIA INFUSION
4.0000 mg | INTRAVENOUS | Status: DC | PRN
  Administered 2023-05-04 (×4): 4 mg via INTRAVENOUS

## 2023-05-04 MED ORDER — FENTANYL BOLUS VIA INFUSION: 100.0000 ug | INTRAVENOUS | Status: DC | PRN

## 2023-05-04 MED ORDER — SODIUM CHLORIDE 0.9% IV SOLUTION: Freq: Once | INTRAVENOUS | Status: AC

## 2023-05-04 MED ORDER — INSULIN ASPART 100 UNIT/ML IV SOLN
10.0000 [IU] | Freq: Once | INTRAVENOUS | Status: AC
  Administered 2023-05-04: 10 [IU] via INTRAVENOUS

## 2023-05-04 MED ORDER — CALCIUM GLUCONATE 10 % IV SOLN
1.0000 g | Freq: Once | INTRAVENOUS | Status: DC
  Filled 2023-05-04: qty 10

## 2023-05-04 MED ORDER — EPINEPHRINE HCL 5 MG/250ML IV SOLN IN NS
INTRAVENOUS | Status: AC
  Filled 2023-05-04: qty 250

## 2023-05-04 MED ORDER — DEXTROSE 50 % IV SOLN: 25.0000 g | INTRAVENOUS | Status: AC

## 2023-05-04 MED ORDER — SODIUM CHLORIDE 0.9% IV SOLUTION: Freq: Once | INTRAVENOUS | Status: DC

## 2023-05-04 MED ORDER — GLYCOPYRROLATE 0.2 MG/ML IJ SOLN: 0.2000 mg | INTRAMUSCULAR | Status: DC | PRN

## 2023-05-04 MED ORDER — SODIUM CHLORIDE 0.9 % IV SOLN: INTRAVENOUS | Status: DC | PRN

## 2023-05-04 MED ORDER — OCTREOTIDE LOAD VIA INFUSION
50.0000 ug | Freq: Once | INTRAVENOUS | Status: AC
  Administered 2023-05-04: 50 ug via INTRAVENOUS
  Filled 2023-05-04: qty 25

## 2023-05-04 MED ORDER — NOREPINEPHRINE 16 MG/250ML-% IV SOLN
0.0000 ug/min | INTRAVENOUS | Status: DC
  Administered 2023-05-04 (×3): 40 ug/min via INTRAVENOUS
  Filled 2023-05-04 (×4): qty 250

## 2023-05-04 MED ORDER — STERILE WATER FOR INJECTION IV SOLN
INTRAVENOUS | Status: DC
  Filled 2023-05-04: qty 1000
  Filled 2023-05-04 (×2): qty 150

## 2023-05-04 MED ORDER — CALCIUM GLUCONATE-NACL 1-0.675 GM/50ML-% IV SOLN
1.0000 g | Freq: Once | INTRAVENOUS | Status: AC
  Administered 2023-05-04: 1000 mg via INTRAVENOUS
  Filled 2023-05-04: qty 50

## 2023-05-04 MED ORDER — MIDAZOLAM HCL 2 MG/2ML IJ SOLN
2.0000 mg | INTRAMUSCULAR | Status: DC | PRN
  Administered 2023-05-04: 2 mg via INTRAVENOUS
  Filled 2023-05-04 (×2): qty 4

## 2023-05-04 MED ORDER — SODIUM CHLORIDE 0.9 % IV SOLN: INTRAVENOUS | Status: DC

## 2023-05-04 MED ORDER — ACETAMINOPHEN 650 MG RE SUPP: 650.0000 mg | Freq: Four times a day (QID) | RECTAL | Status: DC | PRN

## 2023-05-04 MED ORDER — SODIUM BICARBONATE 8.4 % IV SOLN
100.0000 meq | Freq: Once | INTRAVENOUS | Status: AC
  Administered 2023-05-04: 100 meq via INTRAVENOUS
  Filled 2023-05-04: qty 50

## 2023-05-04 MED ORDER — MORPHINE SULFATE (PF) 4 MG/ML IV SOLN: 8.0000 mg | Freq: Once | INTRAVENOUS | Status: DC | PRN

## 2023-05-04 MED ORDER — SODIUM CHLORIDE 0.9 % IV SOLN
50.0000 ug/h | INTRAVENOUS | Status: DC
  Administered 2023-05-04 (×2): 50 ug/h via INTRAVENOUS
  Filled 2023-05-04 (×3): qty 1

## 2023-05-04 MED ORDER — DEXTROSE 50 % IV SOLN
1.0000 | Freq: Once | INTRAVENOUS | Status: AC
  Administered 2023-05-04: 50 mL via INTRAVENOUS
  Filled 2023-05-04: qty 50

## 2023-05-04 MED ORDER — INSULIN ASPART 100 UNIT/ML IV SOLN
5.0000 [IU] | Freq: Once | INTRAVENOUS | Status: AC
  Administered 2023-05-04: 5 [IU] via INTRAVENOUS

## 2023-05-04 MED ORDER — DEXTROSE 10 % IV SOLN: Freq: Once | INTRAVENOUS | Status: DC

## 2023-05-04 MED ORDER — PANTOPRAZOLE SODIUM 40 MG IV SOLR
40.0000 mg | Freq: Two times a day (BID) | INTRAVENOUS | Status: DC
  Administered 2023-05-04 (×2): 40 mg via INTRAVENOUS
  Filled 2023-05-04 (×2): qty 10

## 2023-05-04 MED ORDER — DEXTROSE 50 % IV SOLN
INTRAVENOUS | Status: AC
  Administered 2023-05-04: 25 g via INTRAVENOUS
  Filled 2023-05-04: qty 50

## 2023-05-04 MED ORDER — GLYCOPYRROLATE 1 MG PO TABS: 1.0000 mg | ORAL_TABLET | ORAL | Status: DC | PRN

## 2023-05-04 MED ORDER — PHENYLEPHRINE HCL-NACL 20-0.9 MG/250ML-% IV SOLN
0.0000 ug/min | INTRAVENOUS | Status: DC
  Administered 2023-05-04: 400 ug/min via INTRAVENOUS
  Administered 2023-05-04: 350 ug/min via INTRAVENOUS
  Administered 2023-05-04: 400 ug/min via INTRAVENOUS
  Administered 2023-05-04: 110 ug/min via INTRAVENOUS
  Administered 2023-05-04: 320 ug/min via INTRAVENOUS
  Administered 2023-05-04: 380 ug/min via INTRAVENOUS
  Administered 2023-05-04: 400 ug/min via INTRAVENOUS
  Administered 2023-05-04: 20 ug/min via INTRAVENOUS
  Administered 2023-05-04: 400 ug/min via INTRAVENOUS
  Filled 2023-05-04: qty 500
  Filled 2023-05-04 (×6): qty 250

## 2023-05-04 MED ORDER — SODIUM ZIRCONIUM CYCLOSILICATE 10 G PO PACK
10.0000 g | PACK | Freq: Once | ORAL | Status: AC
  Administered 2023-05-04: 10 g
  Filled 2023-05-04: qty 1

## 2023-05-04 MED ORDER — EPINEPHRINE HCL 5 MG/250ML IV SOLN IN NS
0.5000 ug/min | INTRAVENOUS | Status: DC
  Filled 2023-05-04: qty 250

## 2023-05-04 MED ORDER — CALCIUM GLUCONATE-NACL 1-0.675 GM/50ML-% IV SOLN
1.0000 g | Freq: Once | INTRAVENOUS | Status: AC
  Administered 2023-05-04: 1000 mg via INTRAVENOUS
  Filled 2023-05-04 (×2): qty 50

## 2023-05-04 MED ORDER — MIDAZOLAM HCL 2 MG/2ML IJ SOLN
2.0000 mg | INTRAMUSCULAR | Status: DC | PRN
  Administered 2023-05-04: 4 mg via INTRAVENOUS
  Filled 2023-05-04 (×2): qty 2

## 2023-05-04 MED ORDER — HYDROCORTISONE SOD SUC (PF) 100 MG IJ SOLR
50.0000 mg | Freq: Three times a day (TID) | INTRAMUSCULAR | Status: DC
  Administered 2023-05-04 (×2): 50 mg via INTRAVENOUS
  Filled 2023-05-04 (×2): qty 2

## 2023-05-04 MED ORDER — FENTANYL 2500MCG IN NS 250ML (10MCG/ML) PREMIX INFUSION: 0.0000 ug/h | INTRAVENOUS | Status: DC

## 2023-05-04 MED ORDER — VASOPRESSIN 20 UNITS/100 ML INFUSION FOR SHOCK
0.0000 [IU]/min | INTRAVENOUS | Status: DC
  Administered 2023-05-04 (×2): 0.03 [IU]/min via INTRAVENOUS
  Filled 2023-05-04 (×2): qty 100

## 2023-05-04 MED ORDER — ACETAMINOPHEN 325 MG PO TABS: 650.0000 mg | ORAL_TABLET | Freq: Four times a day (QID) | ORAL | Status: DC | PRN

## 2023-05-04 MED ORDER — VITAMIN K1 10 MG/ML IJ SOLN
5.0000 mg | Freq: Once | INTRAVENOUS | Status: AC
  Administered 2023-05-04: 5 mg via INTRAVENOUS
  Filled 2023-05-04: qty 0.5

## 2023-05-04 MED ORDER — POLYVINYL ALCOHOL 1.4 % OP SOLN: 1.0000 [drp] | Freq: Four times a day (QID) | OPHTHALMIC | Status: DC | PRN

## 2023-05-05 LAB — TYPE AND SCREEN
ABO/RH(D): O NEG
Antibody Screen: POSITIVE
DAT, IgG: NEGATIVE
Donor AG Type: NEGATIVE
Donor AG Type: NEGATIVE
Donor AG Type: NEGATIVE
Donor AG Type: NEGATIVE
Unit division: 0
Unit division: 0
Unit division: 0
Unit division: 0

## 2023-05-05 LAB — BLOOD GAS, ARTERIAL
Acid-base deficit: 8.5 mmol/L — ABNORMAL HIGH (ref 0.0–2.0)
Bicarbonate: 16.3 mmol/L — ABNORMAL LOW (ref 20.0–28.0)
Drawn by: 20012
FIO2: 40 %
MECHVT: 550 mL
O2 Saturation: 99 %
PEEP: 5 cmH2O
Patient temperature: 37.8
RATE: 12 {breaths}/min
pCO2 arterial: 32 mm[Hg] (ref 32–48)
pH, Arterial: 7.32 — ABNORMAL LOW (ref 7.35–7.45)
pO2, Arterial: 92 mm[Hg] (ref 83–108)

## 2023-05-05 LAB — BPAM RBC
Blood Product Expiration Date: 202410242359
Blood Product Expiration Date: 202410292359
Blood Product Expiration Date: 202410302359
Blood Product Expiration Date: 202411102359
ISSUE DATE / TIME: 202410040121
ISSUE DATE / TIME: 202410040749
ISSUE DATE / TIME: 202410040749
ISSUE DATE / TIME: 202410041024
Unit Type and Rh: 9500
Unit Type and Rh: 9500
Unit Type and Rh: 9500
Unit Type and Rh: 9500

## 2023-05-05 LAB — CULTURE, RESPIRATORY W GRAM STAIN

## 2023-05-06 LAB — PREPARE FRESH FROZEN PLASMA: Unit division: 0

## 2023-05-06 LAB — BPAM FFP
Blood Product Expiration Date: 202410050905
Blood Product Expiration Date: 202410050905
Blood Product Expiration Date: 202410092359
Blood Product Expiration Date: 202410092359
ISSUE DATE / TIME: 202410040750
ISSUE DATE / TIME: 202410040750
Unit Type and Rh: 5100
Unit Type and Rh: 5100
Unit Type and Rh: 5100
Unit Type and Rh: 5100

## 2023-05-12 ENCOUNTER — Other Ambulatory Visit: Payer: Self-pay | Admitting: Family Medicine

## 2023-05-12 DIAGNOSIS — K7031 Alcoholic cirrhosis of liver with ascites: Secondary | ICD-10-CM

## 2023-06-01 NOTE — Progress Notes (Signed)
NAME:  Nathan Roberts, MRN:  [id number], DOB:  [birth year], LOS: 10 ADMISSION DATE:  05-10-23, CONSULTATION DATE: 04/26/2023 REFERRING MD: Imogene Burn - [facility] CHIEF COMPLAINT: Hepatic encephalopathy  History of Present Illness:  Patient is encephalopathic and/or intubated; therefore, history has been obtained from chart review.  62 year old male with past medical history as below, which is significant for alcohol-related hepatic cirrhosis, ascites refractory to multiple paracentesis and diuretics, medical noncompliance, esophageal varices, and hepatic encephalopathy which had been well-controlled with medications.  Recently admitted to [facility] in late August for ascites.  He had a endoscopy at that time showing grade 1 esophageal varices.  He has been followed by both [facility], and GI at [facility]. Last note from [facility] recommended Lasix and possibly spiro for volume. Cipro 500 daily for SBP ppx, and sodium restricted diet. He was felt to be a poor candidate for transplant due to social issues, history of non-compliance, and functional status. TIPs, however, was not considered to be off the table as his HE was able to be controlled with medications. Patient has declined TIPS in the past. Most recent paracentesis 9/20 during which 13L was removed.   He presented to [facility] ED 05-10-23 via EMS after he was noted to exhibit unusual behavior at the grocery store. He was oriented to self in the ED, but was otherwise confused. Ammonia level was noted to be high in correlation to his history of hepatic encephalopathy. He received a dose of lactulose in the ED and was admitted to the hospitalists for treatment of HE with lactulose and decompensated cirrhosis with diuretics and SBP ppx. However, in the overnight hours 9/25-26 he became increasingly lethargic and ammonia level climbed despite treatment. He was transferred to the ICU minimally responsive with poor protection of his airway and ultimately vomited. PCCM  consulted for consideration of intubation.   Pertinent Medical History:   Past Medical History:  Diagnosis Date   Allergy    Arthritis    Carpal tunnel syndrome    Diabetes mellitus without complication (HCC)    Esophageal varices (HCC)    GERD (gastroesophageal reflux disease)    GI bleed    Hyperlipidemia    Hypertension    Pancreas divisum 03/22/2023   Portal hypertensive gastropathy (HCC)    Pre-diabetes    Significant Hospital Events: Including procedures, antibiotic start and stop dates in addition to other pertinent events   05/10/23 Presented with confusion. Admitted for treatment of HE 9/26 Tx to ICU for lethargy, vomiting, aspiration concern. Intubated.  9/28 Failed weaning 9/28 Over 9 L paracentesis-sent for analysis 9/29 Head CT negative 10/1 Awake and following commands, extubation deferred due to increased secretions 10/2 Low-grade fever, secretions persist, poor mental status 10/3 Worsening mental status, new fever to 102F, mild acidosis. LA up to 9. Antibiotics broadened to Zosyn. Pressors initiated.  Interim History / Subjective:  Escalating pressor requirements overnight despite broadening antibiotics Oral contrast administered VT for CT A/P Copious vomiting, OGT put to suction with removal of vomitus and subsequent dark blood from stomach Melena noted to Flexi-seal 3U PRBCs + 2U FFP given Max dose on 3 vasopressors GI to bedside for scope, patient unfortunately too unstable to undergo procedure D/w patient's son, who was in agreement with no further escalation of care/DNR code status Plan for transition to comfort care  Objective:  Blood pressure (!) 81/17, pulse (!) 114, temperature 98.2 F (36.8 C), temperature source Bladder, resp. rate (!) 32, height 5\' 8"  (1.727 m),  weight 81.2 kg, SpO2 97%.    Vent Mode: PRVC FiO2 (%):  [30 %-100 %] 100 % Set Rate:  [12 bmp] 12 bmp Vt Set:  [550 mL] 550 mL PEEP:  [5 cmH20] 5 cmH20 Plateau Pressure:  [19 cmH20-27  cmH20] 21 cmH20   Intake/Output Summary (Last 24 hours) at June 02, 2023 0919 Last data filed at June 02, 2023 0908 Gross per 24 hour  Intake 5428.9 ml  Output 2705 ml  Net 2723.9 ml   Filed Weights   05/01/23 0500 05/02/23 0500 05/03/23 0409  Weight: 74.5 kg 74.6 kg 81.2 kg   Physical Examination: General: Acutely ill-appearing middle-aged man, toxic-appearing. HEENT: [place]/AT, +scleral icterus. Pupils 4mm and reactive. Moist mucous membranes. Neuro:  Stuporous, grossly non-responsive to stimuli.  Does not respond to verbal, tactile or noxious stimuli. Does not withdraw to pain. Not following commands. No spontaneous movement of extremities noted. Profound generalized weakness. No cough, gag or corneals. CV: Tachycardic, regular rhythm, no m/g/r. PULM: Breathing tachypneic and shallow on vent (PEEP 5, FiO2 100%). Lung fields diminished throughout. GI: Soft, severely distended, nontender. Large umbilical hernia present, protruding but soft/reducible without signs of strangulation, ~2.5-3cm fascial defect. Hypoactive bowel sounds. Extremities: Bilateral symmetric 1+ LE edema noted. Skin: Warm/dry, scattered ecchymosis to bilateral upper extremities, chest/neck.  Resolved Hospital Problem List:     Assessment & Plan:  Hemorrhagic shock in the setting of GIB, coagulopathy; possible component of septic shock GIB - Goal MAP > 65 - Fluid and blood product resuscitation as tolerated - Received 3U PRBCs + 2U FFP - Levo, Neo, Vaso titrated to goal MAP - Trend H&H, WBC, fever curve, LA - Continue broad-spectrum antibiotics (Zosyn) - Continue PPI BID, octreotide - Appreciate GI consult, patient is unfortunately too unstable to undergo endoscopy; in the setting of his degree of critical illness unsure intervention would change his trajectory at this juncture  Acute hypoxemic respiratory failure Aspiration pneumonia New fever 10/2 - covered for HAP, await ID of GPC chains?  Strep versus Enterococcus.  No evidence of SBP on tap 9/27. - Continue full vent support (4-8cc/kg IBW) - Wean FiO2 for O2 sat > 90% - Daily WUA/SBT, not appropriate at this time due to severe encephalopathy/clinical instability - VAP bundle - Pulmonary hygiene - PAD protocol for sedation: Fentanyl and Versed for goal RASS 0 to -1, PRN only to allow for neurologic assessment  Acute hepatic encephalopathy secondary to alcoholic cirrhosis Hyperammonemia  - Continue Rifaximin - Lactulose held - PAD protocol with RASS goal 0 to -1, sedation held as able - Neuroprotective measures: HOB > 30 degrees, normoglycemia, normothermia, electrolytes WNL  Alcoholic cirrhosis with meld of 18, MELD sodium of 22 Portal hypertension Refractory ascites S/p paracentesis for 9.2 L 9/27, fluid studies not consistent with SBP. - Appreciate GI recommendations - Propranolol held - Holding diuretics given severe hypotension - No further taps at present due to acute kidney injury/developing renal failure - Unfortunately not a transplant candidate at this juncture as he is much too critically ill - MELD-Na: 31 at 06-02-23  7:28 AM Calculated from: Serum Creatinine: 2.29 mg/dL at 71/12/9676  9:38 AM Serum Sodium: 143 mmol/L (Using max of 137 mmol/L) at June 02, 2023  7:28 AM Total Bilirubin: 2.5 mg/dL at 04/30/7509  2:58 AM Serum Albumin: 2.7 g/dL at 52/01/7823  2:35 AM INR(ratio): 3.3 at 02-Jun-2023 12:49 AM Age at listing (hypothetical): 62 years Sex: Male at 06/02/2023  7:28 AM  Acute kidney injury versus early renal failure, likely ATN in the setting of  shock/sepsis Hyperkalemia - Trend BMP - Replete electrolytes as indicated - K medically shifted with bicarb, Ca, insulin/D50 - Monitor I&Os - Avoid nephrotoxic agents as able - Ensure adequate renal perfusion  T2DM - SSI - CBGs Q4H - Goal CBG 140-180  Goals of care discussions ongoing - D/w patient's son, Nathan Roberts; patient despite all available interventions has continued to  decompensate clinically - Initially made DNR 10/4AM with agreement for no further escalation of care - Eventually patient continued to decompensate with ongoing GI bleeding/coagulopathy and worsening hemorrhagic shock despite multiple units of blood products, unable to receive GI intervention due to instability; uptrending pressor requirements - DNR transitioned to comfort-focused care 10/4 afternoon per son's wishes, in-hospital death anticipated  Best Practice: (right click and "Reselect all SmartList Selections" daily)   Diet/type: tubefeeds held, NPO DVT prophylaxis: not indicated GI prophylaxis: PPI Lines: Central line and Arterial Line Foley:  Yes, and it is still needed Code Status:  DNR Last date of multidisciplinary goals of care discussion - 10/4 DNR status/no escalation of care per patient's son, Nathan Roberts; later transitioned to comfort measures  Critical care time:   The patient is critically ill with multiple organ system failure and requires high complexity decision making for assessment and support, frequent evaluation and titration of therapies, advanced monitoring, review of radiographic studies and interpretation of complex data.   Critical Care Time devoted to patient care services, exclusive of separately billable procedures, described in this note is 41 minutes.  Tim Lair, PA-C [facility] 05/11/2023 9:19 AM  Please see [email or web address] for pager details.  From 7A-7P if no response, please call [phone] After hours, please call ELink [phone]

## 2023-06-01 NOTE — Progress Notes (Signed)
Patient made comfort measures by son, Susy Frizzle. Medications titrated for comfort, patient extubated per order. Patient asystole at 1425. No heart/lung sounds auscultated. Verified by Geni Bers, RN and Marcos Eke, RN. Family notified. MD aware.

## 2023-06-01 NOTE — Procedures (Signed)
Extubation Procedure Note  Patient Details:   Name: Nathan Roberts DOB: 12/21/1960 MRN: [id number]   Airway Documentation:    Vent end date: (S) 05-11-23 Vent end time: (S) 1404   Evaluation  O2 sats: currently acceptable Complications: No apparent complications Patient did tolerate procedure well. Bilateral Breath Sounds: Rhonchi, Fine crackles   No pt phonation due to extubation to comfort care, provider @ bedside. Placed pt on 2 L  for comfort.   Nathan Roberts Nathan Roberts 2023-05-11, 2:10 PM

## 2023-06-01 NOTE — Progress Notes (Addendum)
eLink Physician-Brief Progress Note Patient Name: Nathan Roberts DOB: [birth year] MRN: [id number]   Date of Service  05/19/2023  HPI/Events of Note  Pt now with ~2.5L of dark red material being suctioned from OGT. Tube feeds had been held, but was previously running at 29ml/hr.  PT had a BM - reported to look like tube feeds. No melena/hematochezia  As this was happening, BP had dropped, and levophed now maxxed to 68mcg/min.    eICU Interventions  Will check CBC, BMP, coags, lactate, ammonia. Will also check an ABG.  Increased PPI to BID, added octreotide.  Added phenylephrine as 3rd pressor.  Will follow hemodynamics, labs, give blood products as needed.  When stable for imaging, would get abdomen+pelvis with contrast.  Given need for multiple blood draws, close BP monitoring, would have A line placed.            Bertrum Helmstetter M DELA CRUZ 05/10/2023, 12:47 AM  Labs resulted:  Hgb 11, plt 115, INR 3.3   - Received 1 unit PRBC emergent release   - Will transfuse FFP, give vitamin K to try and address coagulopathy   - Will continue to follow serial CBC  Pt also with metabolic acidosis, pH 7.09, Bicarb 10, Lactate >9 K also elevated to 7.3    - Placed order for insulin+glucose    - Will give push of 2 amps bicarb, start on a bicarb drip.     - Calcium gluconate ordered as well.     - Cannot give lokelma, but pt starting to have increased BM now.     - Follow ABG, BMP    - Maintain on continuous cardiac monitoring Overall prognosis poor.    2:27 AM Spoke with GI, pt not a candidate for intervention at this time as he is too unstable.  Will continue present resuscitative measures.   Updated son of this new development as well.

## 2023-06-01 NOTE — IPAL (Signed)
Interdisciplinary Goals of Care Family Meeting   Date carried out: 05/22/2023  Location of the meeting: Phone conference  Member's involved: Physician and Family Member or next of kin son, [name], designated Management consultant  Durable Power of Insurance risk surveyor: son, [name], designated Management consultant    Discussion: We discussed goals of care for Nathan Roberts .  Underlying cirrhosis.  Intubated with encephalopathy.  Massive GI bleed started overnight.  Escalated to multiple pressors now adding a fourth.  Serial transfusions.  2 unstable for EGD overnight.  Hope for EGD this morning but again too hypotensive.  Developing renal failure.  Refractory hyperkalemia.  Too unstable to tolerate dialysis even if was a candidate.  Given is not listed for transplant he is not a candidate for this.  Most recent GI note 04/17/2023 list several barriers to transplant including Poor adherence to prior recommendations, poor functional status, lack of social support.  In summary he is in multisystem organ failure with ongoing hemorrhagic shock without solution or ability to intervene.  Now on 4 vasopressors.  He is essentially actively dying.  There is nothing more we can do to help with this.  Ongoing transfusions are futile.  I offered to continue current care with no escalation in effort to see if family wants to visit.  I suspect he would die prior to this but we can try.  I recommended comfort measures.  He is going to call his family and discuss.  We agreed on DNR and no escalation of care at this time.  Code status:   Code Status: Limited: Do not attempt resuscitation (DNR) -DNR-LIMITED -Do Not Intubate/DNI    Disposition: Continue current acute care, no escalation of care, consider full comfort measures in the coming hours  Time spent for the meeting: 10 minutes    Karren Burly, MD  05/16/2023, 10:47 AM

## 2023-06-01 NOTE — Progress Notes (Signed)
eLink Physician-Brief Progress Note Patient Name: Nathan Roberts DOB: 05-11-1961 MRN: [id number]   Date of Service  05-31-23  HPI/Events of Note  PT had thrown up PO contrast per bedside RN.  OGT had been hooked back up to LIS.   eICU Interventions  Will discuss with radiology.  In the meantime, will check KUB.   Pt also reported to have borderline low BP on near max dose levophed.  Added vasopressin, stress dose hydrocortisone for now.  Will continue to monitor closely.         Mazy Culton M DELA CRUZ 05/31/2023, 12:13 AM

## 2023-06-01 NOTE — Inpatient Diabetes Management (Signed)
Inpatient Diabetes Program Recommendations  AACE/ADA: New Consensus Statement on Inpatient Glycemic Control (2015)  Target Ranges:  Prepandial:   less than 140 mg/dL      Peak postprandial:   less than 180 mg/dL (1-2 hours)      Critically ill patients:  140 - 180 mg/dL   Lab Results  Component Value Date   GLUCAP 38 (LL) 2023/05/23   HGBA1C 4.6 (L) 03/22/2023    Review of Glycemic Control  Latest Reference Range & Units 05/03/23 07:47 05/03/23 11:43 05/03/23 16:39 05/03/23 19:35 05/03/23 23:19 05-23-23 03:19 May 23, 2023 08:00 May 23, 2023 08:03  Glucose-Capillary 70 - 99 mg/dL 161 (H) 096 (H) 045 (H) 103 (H) 134 (H) 138 (H) 35 (LL) 38 (LL)   Diabetes history: DM 2 Outpatient Diabetes medications: Metformin 500 mg Daily Current orders for Inpatient glycemic control:  Novolog 0-15 units Q4 hours  A1c 4.6% on 8/22 Vital 70 ml/hour Creat/BUN: 2.15/55 Cortef 50 mg Q8 hours  Inpatient Diabetes Program Recommendations:    Note: renal function elevated, pt received Novolog 2 units yesterday per sliding scale then received hyperkalemia treatment insulin resulting in hypoglycemia in the 30's this am.  Watch on current regimen  Thanks,  Christena Deem RN, MSN, BC-ADM Inpatient Diabetes Coordinator Team Pager [phone] (8a-5p)

## 2023-06-01 NOTE — Death Summary Note (Signed)
DEATH SUMMARY   Patient Details  Name: Nathan Roberts MRN: [id number] DOB: 1961-07-14  Admission/Discharge Information   Admit Date:  05-22-2023  Date of Death: Date of Death: 2023-06-01  Time of Death: Time of Death: 1425  Length of Stay: 07-08-23  Referring Physician: Clayborne Dana, NP   Reason(s) for Hospitalization  Severe hepatic encephalopathy severe decompensated cirrhosis  Diagnoses  Preliminary cause of death:  Secondary Diagnoses (including complications and co-morbidities):  Principal Problem:   Acute liver failure Active Problems:   Hypertension   Ascites due to alcoholic cirrhosis (HCC)   Hepatic encephalopathy (HCC)   Alcoholic cirrhosis of liver with ascites (HCC)   Diabetes mellitus (HCC)   Chronic hyponatremia - baseline SCr 124-131   Counseling and coordination of care   Palliative care encounter   Acute respiratory failure (HCC)   Goals of care, counseling/discussion   Need for emotional support   DNR (do not resuscitate) discussion   Shock (HCC)   Comfort measures only status   Hemorrhagic shock (HCC)   Acute renal failure (ARF) [facility])   Brief Hospital Course (including significant findings, care, treatment, and services provided and events leading to death)  Nathan Roberts is a 62 y.o. 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  Developed massive hemorrhage upper GI bleed.  Escalated to 4 pressors.  Too unstable to scope.  Renal failure.  Worsening liver failure.  Not a transplant candidate.  After goals of care discussion with family agreed with comfort measures.  Palliatively extubated and support measures withdrawn.  He died peacefully.    Pertinent Labs and Studies  Significant Diagnostic Studies DG Abd 1 View  Result Date: 06/01/2023 CLINICAL DATA:  40981. Abdominal distention. Last recorded paracentesis 04/28/2023 9.2 L removed.  EXAM: ABDOMEN - 1 VIEW COMPARISON:  Most recent KUB 04/26/2023, most recent abdomen and pelvis CT without contrast 03/21/2023. FINDINGS: The bowel gas pattern is abnormal. There is interval increased diffuse dilatation of the visualized small bowel up to 4.7 cm. There is moderate aeration of the visualized transverse colon but no pathologic dilatation. Findings could be due to interval new distal small-bowel obstruction or severe ileus. CT recommended. There are no supine findings of free air. There is interval increased gas dilatation of the gastric fundus. NGT is in place with the tip in the antrum. Unknown if this is the same NGT in place on 04/26/2023. There are no pathologic calcifications or other significant radiographic findings. The true pelvis was not included in the exam. IMPRESSION: 1. Abnormal bowel pattern with interval increased diffuse dilatation of the visualized small bowel up to 4.7 cm. There is moderate aeration of the visualized transverse colon but no pathologic dilatation. Findings could be due to interval new distal small-bowel obstruction or severe ileus. CT recommended. 2. Increased gas dilatation of the gastric fundus. NGT is in place with the tip in the gastric antrum. 3. These results will be called to the ordering clinician or representative by the Radiologist Assistant, and communication documented in the PACS or [employer]. Electronically Signed   By: Almira Bar M.D.   On: 01-Jun-2023 00:52   DG CHEST PORT 1 VIEW  Result Date: 05/03/2023 CLINICAL DATA:  Central line EXAM: PORTABLE CHEST 1 VIEW COMPARISON:  05/03/2023, 05/01/2023 FINDINGS: Endotracheal tube tip about 4.5 cm superior to the carina. Esophageal tube tip overlies the stomach. Left IJ central venous catheter tip near  the cavoatrial region. Low lung volumes. No visible left pneumothorax. Possible trace right apical pneumothorax. IMPRESSION: Left IJ central venous catheter tip near the cavoatrial region, no  visible left pneumothorax Possible trace right apical pneumothorax, attention on follow-up imaging. These results will be called to the ordering clinician or representative by the Radiologist Assistant, and communication documented in the PACS or [employer]. Electronically Signed   By: Jasmine Pang M.D.   On: 05/03/2023 16:14   DG CHEST PORT 1 VIEW  Result Date: 05/03/2023 CLINICAL DATA:  62 year old male with history of endotracheal tube placement EXAM: PORTABLE CHEST 1 VIEW COMPARISON:  05/01/2023 FINDINGS: Cardiomediastinal silhouette unchanged in size and contour. Low lung volumes persist. Mild reticulonodular opacities, similar to the prior chest x-ray. No pneumothorax or large pleural effusion. Endotracheal tube terminates 2.8 cm above the carina. Gastric tube projects over the mediastinum, terminating in the stomach. IMPRESSION: Endotracheal tube suitably position above the carina, proximally 2.8 cm. Gastric tube unchanged. Low lung volumes persist, with unchanged mild reticulonodular opacities, potentially combination of atelectasis and/or other interstitial/airspace disease. Electronically Signed   By: Gilmer Mor D.O.   On: 05/03/2023 09:26   DG CHEST PORT 1 VIEW  Result Date: 05/01/2023 CLINICAL DATA:  Respiratory distress EXAM: PORTABLE CHEST 1 VIEW COMPARISON:  Chest radiograph 2 days prior FINDINGS: The endotracheal tube tip is proximally 3.4 cm from the carina. The enteric tube tip is off the field of view. The cardiomediastinal silhouette is stable Vascular congestion and possible mild pulmonary interstitial edema are slightly increased since the prior study. There is no new or worsening focal airspace consolidation. There is a probable small right pleural effusion which appears increased in size. There is no significant left effusion. There is no pneumothorax There is no acute osseous abnormality. IMPRESSION: Vascular congestion with possible mild pulmonary interstitial edema, and  probable small right pleural effusion are slightly worsened since the prior study from 2 days ago. Electronically Signed   By: Lesia Hausen M.D.   On: 05/01/2023 13:57   CT HEAD WO CONTRAST ( )  Result Date: 04/29/2023 CLINICAL DATA:  Mental status change, persistent or worsening. EXAM: CT HEAD WITHOUT CONTRAST TECHNIQUE: Contiguous axial images were obtained from the base of the skull through the vertex without intravenous contrast. RADIATION DOSE REDUCTION: This exam was performed according to the departmental dose-optimization program which includes automated exposure control, adjustment of the mA and/or kV according to patient size and/or use of iterative reconstruction technique. COMPARISON:  04/26/2023 FINDINGS: Brain: No change or acute finding. The brainstem and cerebellum are unremarkable. Cerebral hemispheres show volume loss with chronic small-vessel change of the white matter. No cortical or large vessel territory stroke. No evidence of recent insult. No mass, hemorrhage, hydrocephalus or extra-axial collection. Vascular: There is atherosclerotic calcification of the major vessels at the base of the brain. Skull: Negative Sinuses/Orbits: Clear/normal Other: Small amount of mastoid fluid. IMPRESSION: No acute CT finding. Age related volume loss and chronic small-vessel change of the white matter. Electronically Signed   By: Paulina Fusi M.D.   On: 04/29/2023 15:25   DG CHEST PORT 1 VIEW  Result Date: 04/29/2023 CLINICAL DATA:  Shortness of breath EXAM: PORTABLE CHEST - 1 VIEW COMPARISON:  04/26/2023 FINDINGS: Endotracheal tube stable. Gastric tube has been placed at least as far as the stomach, tip not seen. Relatively low lung volumes. No focal infiltrate. Heart size and mediastinal contours are within normal limits. No effusion. Visualized bones unremarkable. IMPRESSION: Low lung volumes. No acute  findings. Electronically Signed   By: Corlis Leak M.D.   On: 04/29/2023 13:24   US  Paracentesis  Result Date: 04/28/2023 INDICATION: Ascites, concern for SBP. Request for diagnostic and therapeutic paracentesis with no maximum. EXAM: ULTRASOUND GUIDED  PARACENTESIS MEDICATIONS: 6 cc 1% lidocaine COMPLICATIONS: None immediate. PROCEDURE: Informed written consent was obtained from the patient after a discussion of the risks, benefits and alternatives to treatment. A timeout was performed prior to the initiation of the procedure. Initial ultrasound scanning demonstrates a large amount of ascites within the right lower abdominal quadrant. The right lower abdomen was prepped and draped in the usual sterile fashion. 1% lidocaine was used for local anesthesia. Following this, a 19 gauge, 10-cm, Yueh catheter was introduced. An ultrasound image was saved for documentation purposes. The paracentesis was performed. The catheter was removed and a dressing was applied. The patient tolerated the procedure well without immediate post procedural complication. Patient received post-procedure intravenous albumin; see nursing notes for details. FINDINGS: A total of approximately 9.2 L of golden yellow fluid was removed. Samples were sent to the laboratory as requested by the clinical team. IMPRESSION: Successful ultrasound-guided paracentesis yielding 9.2 liters of peritoneal fluid. Procedure performed by Mina Marble, PA-C Electronically Signed   By: Malachy Moan M.D.   On: 04/28/2023 15:38   DG Abd 1 View  Result Date: 04/26/2023 CLINICAL DATA:  OG tube reposition EXAM: ABDOMEN - 1 VIEW COMPARISON:  04/26/2023 FINDINGS: Esophageal tube tip overlies the mid to distal stomach. Mild gas-filled transverse colon IMPRESSION: Esophageal tube tip overlies the mid to distal stomach. Electronically Signed   By: Jasmine Pang M.D.   On: 04/26/2023 23:18   DG Abd 1 View  Result Date: 04/26/2023 CLINICAL DATA:  OG tube placement EXAM: ABDOMEN - 1 VIEW COMPARISON:  04/26/2023 FINDINGS: Esophageal tube tip  overlies distal stomach. Metallic density in the left upper quadrant. IMPRESSION: Esophageal tube tip overlies distal stomach. Electronically Signed   By: Jasmine Pang M.D.   On: 04/26/2023 23:17   DG Abd 1 View  Result Date: 04/26/2023 CLINICAL DATA:  Enteric tube placement EXAM: ABDOMEN - 1 VIEW COMPARISON:  CT abdomen and pelvis dated 03/21/2023 FINDINGS: Reported enteric tube is not seen. Partially imaged bowel gas pattern is nonobstructive. Medialization of bowel loops, likely corresponding to ascites. IMPRESSION: Reported enteric tube is not seen. The enteric tube is seen on subsequent radiograph. Electronically Signed   By: Agustin Cree M.D.   On: 04/26/2023 12:04   DG Abd Portable 1V  Result Date: 04/26/2023 CLINICAL DATA:  Status post enteric tube placement EXAM: PORTABLE ABDOMEN - 1 VIEW COMPARISON:  Abdominal radiograph dated 04/26/2023 at 10:19 a.m. FINDINGS: Gastric/enteric tube tip projects over the stomach. Medialization of partially imaged bowel loops in keeping with ascites. No abnormal bowel dilation. IMPRESSION: Gastric/enteric tube tip projects over the stomach. Electronically Signed   By: Agustin Cree M.D.   On: 04/26/2023 12:03   DG CHEST PORT 1 VIEW  Result Date: 04/26/2023 CLINICAL DATA:  Status post intubation EXAM: PORTABLE CHEST 1 VIEW COMPARISON:  Chest radiograph dated 03/25/2023 FINDINGS: Lines/tubes: Endotracheal tube tip projects 5.4 cm above the carina. Lungs: Patient is rotated slightly to the right. Low lung volumes with bronchovascular crowding. Similar asymmetric elevation of the right hemidiaphragm. No focal consolidation. Pleura: No pneumothorax or pleural effusion. Heart/mediastinum: The heart size and mediastinal contours are within normal limits. Bones: Cervical spinal fixation hardware appears intact. IMPRESSION: 1. Endotracheal tube tip projects 5.4 cm above the  carina. 2. Low lung volumes with bronchovascular crowding. No focal consolidation. Electronically Signed    By: Agustin Cree M.D.   On: 04/26/2023 12:00   CT HEAD CODE STROKE WO CONTRAST  Addendum Date: 04/26/2023   ADDENDUM REPORT: 04/26/2023 06:27 ADDENDUM: Study discussed by telephone with NP ABIGAIL CHAVEZ on 04/26/2023 at 0621 hours. Electronically Signed   By: Odessa Fleming M.D.   On: 04/26/2023 06:27   Result Date: 04/26/2023 CLINICAL DATA:  Code stroke.  62 year old male. EXAM: CT HEAD WITHOUT CONTRAST TECHNIQUE: Contiguous axial images were obtained from the base of the skull through the vertex without intravenous contrast. RADIATION DOSE REDUCTION: This exam was performed according to the departmental dose-optimization program which includes automated exposure control, adjustment of the mA and/or kV according to patient size and/or use of iterative reconstruction technique. COMPARISON:  Brain MRI 12/27/2011.  Head CT 03/21/2023. FINDINGS: Brain: Cerebral volume is normal for age. No midline shift, ventriculomegaly, mass effect, evidence of mass lesion, intracranial hemorrhage or evidence of cortically based acute infarction. Mild for age cerebral white matter hypodensity, most pronounced in the periatrial white matter, is stable. Otherwise normal gray-white differentiation. Vascular: No suspicious intracranial vascular hyperdensity. Calcified atherosclerosis at the skull base. Skull: No acute osseous abnormality identified. Sinuses/Orbits: Sinuses are stable and well aerated. Small left maxillary and right sphenoid sinus retention cysts. Other: No gaze deviation. Visualized orbits and scalp soft tissues are within normal limits. ASPECTS [facility] Stroke Program Early CT Score) Total score (0-10 with 10 being normal): 10 IMPRESSION: No acute cortically based infarct or acute intracranial hemorrhage identified. ASPECTS 10. Electronically Signed: By: Odessa Fleming M.D. On: 04/26/2023 06:16    Microbiology Recent Results (from the past 240 hour(s))  MRSA Next Gen by PCR, Nasal     Status: None   Collection Time: 04/26/23   9:00 AM   Specimen: Nasal Mucosa; Nasal Swab  Result Value Ref Range Status   MRSA by PCR Next Gen NOT DETECTED NOT DETECTED Final    Comment: (NOTE) The GeneXpert MRSA Assay (FDA approved for NASAL specimens only), is one component of a comprehensive MRSA colonization surveillance program. It is not intended to diagnose MRSA infection nor to guide or monitor treatment for MRSA infections. Test performance is not FDA approved in patients less than 55 years old. Performed at [facility], [address] [place], [place] [address]   Gram stain     Status: None   Collection Time: 04/28/23  3:03 PM   Specimen: PATH Cytology Peritoneal fluid  Result Value Ref Range Status   Specimen Description   Final    PERITONEAL Performed at [facility], [address] [place], [place] [address]    Special Requests   Final    NONE Performed at [facility], [address] [place], [place] [address]    Gram Stain   Final    NO WBC SEEN NO ORGANISMS SEEN Performed at [facility], [address], [place], [place] [address]    Report Status 04/29/2023 FINAL  Final  Culture, Respiratory w Gram Stain     Status: None (Preliminary result)   Collection Time: 05/01/23  2:26 PM   Specimen: Tracheal Aspirate; Respiratory  Result Value Ref Range Status   Specimen Description   Final    TRACHEAL ASPIRATE Performed at [facility], [address] [place], [place] [address]    Special Requests   Final    NONE  Performed at [facility], [address], [place], [place] [address]    Gram Stain   Final    ABUNDANT WBC PRESENT, PREDOMINANTLY PMN ABUNDANT GRAM POSITIVE COCCI IN PAIRS IN CHAINS FEW GRAM NEGATIVE RODS    Culture   Final    FEW GRAM NEGATIVE RODS IDENTIFICATION AND SUSCEPTIBILITIES TO FOLLOW Performed at [facility], [address], [place], [place] [address]    Report  Status PENDING  Incomplete    Lab Basic Metabolic Panel: Recent Labs  Lab 05/01/23 0323 05/02/23 0316 05/02/23 0317 05/03/23 0306 05/03/23 1301 2023/06/03 0049 Jun 03, 2023 0728  NA 136  --  139 139 143 140 143  K 4.4  --  4.7 5.0 4.9 7.3* 7.4*  CL 112*  --  111 111 114* 110 107  CO2 18*  --  20* 20* 17* 10* 10*  GLUCOSE 157*  --  178* 180* 103* 168* 52*  BUN 28*  --  41* 43* 47* 55* 56*  CREATININE 0.52*  --  0.60* 0.67 1.31* 2.15* 2.29*  CALCIUM 8.0*  --  8.2* 8.1* 8.4* 8.8* 8.6*  MG 2.5* 2.6*  --  2.8*  --  3.5* 3.2*  PHOS 2.3* 2.5 2.4* 2.2*  2.3*  --   --  9.8*   Liver Function Tests: Recent Labs  Lab 04/28/23 0522 04/29/23 0942 05/01/23 0323 05/02/23 0317 05/03/23 0306 05/03/23 0347  AST 57* 45*  --   --   --  56*  ALT 24 21  --   --   --  29  ALKPHOS 90 74  --   --   --  124  BILITOT 3.2* 3.2*  --   --   --  2.5*  PROT 6.4* 5.9*  --   --   --  6.2*  ALBUMIN 2.7* 3.1* 2.4* 2.7* 2.7* 2.7*   No results for input(s): "LIPASE", "AMYLASE" in the last 168 hours. Recent Labs  Lab 04/29/23 0942 04/30/23 0301 05/01/23 0323 05/02/23 1133 06/03/23 0049  AMMONIA 89* 132* 91* 228* 555*   CBC: Recent Labs  Lab 04/29/23 0942 04/30/23 0301 05/01/23 0323 05/02/23 0316 05/03/23 0306 03-Jun-2023 0049 06-03-23 0728  WBC 7.8   < > 10.3 9.5 8.5 24.5* 24.1*  NEUTROABS 4.6  --   --  6.5 5.1  --  18.5*  HGB 10.7*   < > 10.7* 10.1* 10.2* 11.4* 13.1  HCT 33.8*   < > 34.0* 33.0* 32.4* 39.2 43.1  MCV 102.1*   < > 104.6* 105.8* 102.9* 113.0* 108.0*  PLT 99*   < > 64* 85* 96* 115* 94*   < > = values in this interval not displayed.   Cardiac Enzymes: No results for input(s): "CKTOTAL", "CKMB", "CKMBINDEX", "TROPONINI" in the last 168 hours. Sepsis Labs: Recent Labs  Lab 05/02/23 0316 05/03/23 0306 05/03/23 1619 05/03/23 1925 06-03-2023 0049 06-03-2023 0728  WBC 9.5 8.5  --   --  24.5* 24.1*  LATICACIDVEN  --   --  8.6* >9.0* >9.0*  --     Procedures/Operations  As per  EMR   Lesia Sago Cristabel Bicknell 06/03/2023, 4:38 PM

## 2023-06-01 NOTE — Procedures (Signed)
Arterial Line Insertion Start/End10/27/2024 1:20 AM, 05/28/2023 1:48 AM  Preanesthetic checklist: patient identified, monitors and equipment checked and timeout performed Right, radial was placed Catheter size: 20 G Hand hygiene performed  Allen's test indicative of satisfactory collateral circulation Attempts: 1 Procedure performed without using ultrasound guided technique. Following insertion, dressing applied and Biopatch. Post procedure assessment: normal  Patient tolerated the procedure well with no immediate complications.

## 2023-06-01 NOTE — Progress Notes (Signed)
Pt received last amount of contrast through OG. Moments later, pt found with emesis on face and bed. Tube feeds placed on hold. MD notified, CT canceled. KUB ordered to follow up and get visual. Pt placed on intermittent wall suction. GI content started out as tube feed mixed with contrast. Soon became frank blood. > 2.5 L of GI content wasted from suction. Additional medication added for pressure support, bicarb gtt and octreotide also added. 1 Pack of emergency blood ordered and administered. Elevated potassium also addressed (MAR)

## 2023-06-01 NOTE — Progress Notes (Signed)
Dineen Kid 10:43 AM  Subjective: Patient seen and examined and discussed with the critical care team and we were ready to proceed with urgent endoscopy but the ICU doctor had a long conversation with the patient's son and they believe they are going to proceed with comfort care only but the son wanted to talk to the rest of the family and over the last 24 hours the patient has deteriorated with increased fevers decreased blood pressure upper GI bleeding increased ascites worsening renal failure coagulopathy etc.  Objective: Hypotensive on vent not responsive abdomen obvious ascites umbilical hernia not tense acidotic potassium 7.4 BUN 56 creatinine 2.9 white count 24 hemoglobin 13.1 platelets 94 INR increased from 1.8-3.3  Assessment: Multiorgan failure in patient with cirrhosis  Plan: We were willing to proceed with a urgent endoscopy however we were asked to hold off for now awaiting a family conversation and I will be on standby this weekend to proceed if ICU team would like and please call me if any specific question from a GI standpoint  [facility]  office [phone] After 5PM or if no answer call [phone]

## 2023-06-01 DEATH — deceased
